# Patient Record
Sex: Female | Born: 1955 | Race: White | Hispanic: No | Marital: Married | Smoking: Never smoker
Health system: Southern US, Community
[De-identification: ages and names within clinical notes are randomized; demographics above are authoritative.]

## PROBLEM LIST (undated history)

## (undated) DIAGNOSIS — F439 Reaction to severe stress, unspecified: Secondary | ICD-10-CM

## (undated) DIAGNOSIS — I10 Essential (primary) hypertension: Secondary | ICD-10-CM

## (undated) DIAGNOSIS — G479 Sleep disorder, unspecified: Secondary | ICD-10-CM

## (undated) DIAGNOSIS — G2581 Restless legs syndrome: Secondary | ICD-10-CM

## (undated) DIAGNOSIS — R011 Cardiac murmur, unspecified: Secondary | ICD-10-CM

## (undated) DIAGNOSIS — R0602 Shortness of breath: Secondary | ICD-10-CM

## (undated) DIAGNOSIS — E039 Hypothyroidism, unspecified: Secondary | ICD-10-CM

## (undated) DIAGNOSIS — R233 Spontaneous ecchymoses: Secondary | ICD-10-CM

## (undated) DIAGNOSIS — Z5189 Encounter for other specified aftercare: Secondary | ICD-10-CM

## (undated) DIAGNOSIS — Z9221 Personal history of antineoplastic chemotherapy: Secondary | ICD-10-CM

## (undated) DIAGNOSIS — R6889 Other general symptoms and signs: Secondary | ICD-10-CM

## (undated) DIAGNOSIS — R350 Frequency of micturition: Secondary | ICD-10-CM

## (undated) DIAGNOSIS — R002 Palpitations: Secondary | ICD-10-CM

## (undated) DIAGNOSIS — M2550 Pain in unspecified joint: Secondary | ICD-10-CM

## (undated) DIAGNOSIS — F419 Anxiety disorder, unspecified: Secondary | ICD-10-CM

## (undated) DIAGNOSIS — D649 Anemia, unspecified: Secondary | ICD-10-CM

## (undated) DIAGNOSIS — M79606 Pain in leg, unspecified: Secondary | ICD-10-CM

## (undated) DIAGNOSIS — M51369 Other intervertebral disc degeneration, lumbar region without mention of lumbar back pain or lower extremity pain: Secondary | ICD-10-CM

## (undated) DIAGNOSIS — R531 Weakness: Secondary | ICD-10-CM

## (undated) DIAGNOSIS — R5383 Other fatigue: Secondary | ICD-10-CM

## (undated) DIAGNOSIS — E559 Vitamin D deficiency, unspecified: Secondary | ICD-10-CM

## (undated) DIAGNOSIS — R519 Headache, unspecified: Secondary | ICD-10-CM

## (undated) DIAGNOSIS — R7303 Prediabetes: Secondary | ICD-10-CM

## (undated) DIAGNOSIS — C50919 Malignant neoplasm of unspecified site of unspecified female breast: Secondary | ICD-10-CM

## (undated) DIAGNOSIS — K259 Gastric ulcer, unspecified as acute or chronic, without hemorrhage or perforation: Secondary | ICD-10-CM

## (undated) DIAGNOSIS — H269 Unspecified cataract: Secondary | ICD-10-CM

## (undated) DIAGNOSIS — R682 Dry mouth, unspecified: Secondary | ICD-10-CM

## (undated) DIAGNOSIS — K219 Gastro-esophageal reflux disease without esophagitis: Secondary | ICD-10-CM

## (undated) DIAGNOSIS — M7989 Other specified soft tissue disorders: Secondary | ICD-10-CM

## (undated) DIAGNOSIS — M1990 Unspecified osteoarthritis, unspecified site: Secondary | ICD-10-CM

## (undated) DIAGNOSIS — M549 Dorsalgia, unspecified: Secondary | ICD-10-CM

## (undated) DIAGNOSIS — R079 Chest pain, unspecified: Secondary | ICD-10-CM

## (undated) DIAGNOSIS — T7840XA Allergy, unspecified, initial encounter: Secondary | ICD-10-CM

## (undated) DIAGNOSIS — M5136 Other intervertebral disc degeneration, lumbar region: Secondary | ICD-10-CM

## (undated) DIAGNOSIS — M5126 Other intervertebral disc displacement, lumbar region: Secondary | ICD-10-CM

## (undated) DIAGNOSIS — R450 Nervousness: Secondary | ICD-10-CM

## (undated) DIAGNOSIS — Z923 Personal history of irradiation: Secondary | ICD-10-CM

## (undated) DIAGNOSIS — F329 Major depressive disorder, single episode, unspecified: Secondary | ICD-10-CM

## (undated) DIAGNOSIS — M6289 Other specified disorders of muscle: Secondary | ICD-10-CM

## (undated) DIAGNOSIS — R51 Headache: Secondary | ICD-10-CM

## (undated) DIAGNOSIS — C801 Malignant (primary) neoplasm, unspecified: Secondary | ICD-10-CM

## (undated) DIAGNOSIS — G4730 Sleep apnea, unspecified: Secondary | ICD-10-CM

## (undated) DIAGNOSIS — R631 Polydipsia: Secondary | ICD-10-CM

## (undated) DIAGNOSIS — E119 Type 2 diabetes mellitus without complications: Secondary | ICD-10-CM

## (undated) DIAGNOSIS — R238 Other skin changes: Secondary | ICD-10-CM

## (undated) DIAGNOSIS — F32A Depression, unspecified: Secondary | ICD-10-CM

## (undated) HISTORY — PX: EYE SURGERY: SHX253

## (undated) HISTORY — DX: Headache, unspecified: R51.9

## (undated) HISTORY — PX: ABDOMINAL HYSTERECTOMY: SHX81

## (undated) HISTORY — DX: Prediabetes: R73.03

## (undated) HISTORY — DX: Other specified disorders of muscle: M62.89

## (undated) HISTORY — DX: Spontaneous ecchymoses: R23.3

## (undated) HISTORY — DX: Other specified soft tissue disorders: M79.89

## (undated) HISTORY — DX: Headache: R51

## (undated) HISTORY — DX: Chest pain, unspecified: R07.9

## (undated) HISTORY — DX: Nervousness: R45.0

## (undated) HISTORY — DX: Malignant (primary) neoplasm, unspecified: C80.1

## (undated) HISTORY — DX: Other general symptoms and signs: R68.89

## (undated) HISTORY — PX: TUBAL LIGATION: SHX77

## (undated) HISTORY — DX: Dorsalgia, unspecified: M54.9

## (undated) HISTORY — DX: Gastro-esophageal reflux disease without esophagitis: K21.9

## (undated) HISTORY — DX: Vitamin D deficiency, unspecified: E55.9

## (undated) HISTORY — PX: BREAST SURGERY: SHX581

## (undated) HISTORY — DX: Other skin changes: R23.8

## (undated) HISTORY — DX: Weakness: R53.1

## (undated) HISTORY — DX: Dry mouth, unspecified: R68.2

## (undated) HISTORY — DX: Shortness of breath: R06.02

## (undated) HISTORY — DX: Reaction to severe stress, unspecified: F43.9

## (undated) HISTORY — DX: Malignant neoplasm of unspecified site of unspecified female breast: C50.919

## (undated) HISTORY — DX: Other fatigue: R53.83

## (undated) HISTORY — PX: FRACTURE SURGERY: SHX138

## (undated) HISTORY — DX: Other intervertebral disc displacement, lumbar region: M51.26

## (undated) HISTORY — DX: Unspecified cataract: H26.9

## (undated) HISTORY — DX: Restless legs syndrome: G25.81

## (undated) HISTORY — DX: Allergy, unspecified, initial encounter: T78.40XA

## (undated) HISTORY — DX: Unspecified osteoarthritis, unspecified site: M19.90

## (undated) HISTORY — DX: Gastric ulcer, unspecified as acute or chronic, without hemorrhage or perforation: K25.9

## (undated) HISTORY — PX: TONSILLECTOMY: SUR1361

## (undated) HISTORY — PX: CATARACT EXTRACTION: SUR2

## (undated) HISTORY — DX: Other intervertebral disc degeneration, lumbar region: M51.36

## (undated) HISTORY — DX: Cardiac murmur, unspecified: R01.1

## (undated) HISTORY — DX: Encounter for other specified aftercare: Z51.89

## (undated) HISTORY — DX: Sleep disorder, unspecified: G47.9

## (undated) HISTORY — DX: Anxiety disorder, unspecified: F41.9

## (undated) HISTORY — DX: Palpitations: R00.2

## (undated) HISTORY — DX: Polydipsia: R63.1

## (undated) HISTORY — DX: Pain in unspecified joint: M25.50

## (undated) HISTORY — DX: Depression, unspecified: F32.A

## (undated) HISTORY — DX: Frequency of micturition: R35.0

## (undated) HISTORY — DX: Major depressive disorder, single episode, unspecified: F32.9

## (undated) HISTORY — DX: Other intervertebral disc degeneration, lumbar region without mention of lumbar back pain or lower extremity pain: M51.369

## (undated) HISTORY — DX: Pain in leg, unspecified: M79.606

## (undated) HISTORY — PX: APPENDECTOMY: SHX54

---

## 1997-07-10 ENCOUNTER — Ambulatory Visit (HOSPITAL_COMMUNITY): Admission: RE | Admit: 1997-07-10 | Discharge: 1997-07-10 | Payer: Self-pay | Admitting: *Deleted

## 1997-12-10 ENCOUNTER — Other Ambulatory Visit: Admission: RE | Admit: 1997-12-10 | Discharge: 1997-12-10 | Payer: Self-pay | Admitting: *Deleted

## 1997-12-16 ENCOUNTER — Ambulatory Visit (HOSPITAL_COMMUNITY): Admission: RE | Admit: 1997-12-16 | Discharge: 1997-12-16 | Payer: Self-pay | Admitting: *Deleted

## 1998-03-21 DIAGNOSIS — Z9889 Other specified postprocedural states: Secondary | ICD-10-CM | POA: Insufficient documentation

## 1998-11-25 ENCOUNTER — Other Ambulatory Visit: Admission: RE | Admit: 1998-11-25 | Discharge: 1998-11-25 | Payer: Self-pay | Admitting: *Deleted

## 1998-12-21 ENCOUNTER — Ambulatory Visit (HOSPITAL_COMMUNITY): Admission: RE | Admit: 1998-12-21 | Discharge: 1998-12-21 | Payer: Self-pay | Admitting: *Deleted

## 1998-12-21 ENCOUNTER — Encounter: Payer: Self-pay | Admitting: *Deleted

## 1999-12-28 ENCOUNTER — Ambulatory Visit (HOSPITAL_COMMUNITY): Admission: RE | Admit: 1999-12-28 | Discharge: 1999-12-28 | Payer: Self-pay | Admitting: *Deleted

## 1999-12-28 ENCOUNTER — Encounter: Payer: Self-pay | Admitting: *Deleted

## 2000-01-19 ENCOUNTER — Other Ambulatory Visit: Admission: RE | Admit: 2000-01-19 | Discharge: 2000-01-19 | Payer: Self-pay | Admitting: *Deleted

## 2001-01-09 ENCOUNTER — Ambulatory Visit (HOSPITAL_COMMUNITY): Admission: RE | Admit: 2001-01-09 | Discharge: 2001-01-09 | Payer: Self-pay | Admitting: *Deleted

## 2001-01-09 ENCOUNTER — Encounter: Payer: Self-pay | Admitting: *Deleted

## 2001-01-19 ENCOUNTER — Other Ambulatory Visit: Admission: RE | Admit: 2001-01-19 | Discharge: 2001-01-19 | Payer: Self-pay | Admitting: *Deleted

## 2002-01-22 ENCOUNTER — Encounter: Payer: Self-pay | Admitting: *Deleted

## 2002-01-22 ENCOUNTER — Ambulatory Visit (HOSPITAL_COMMUNITY): Admission: RE | Admit: 2002-01-22 | Discharge: 2002-01-22 | Payer: Self-pay | Admitting: *Deleted

## 2002-02-04 ENCOUNTER — Other Ambulatory Visit: Admission: RE | Admit: 2002-02-04 | Discharge: 2002-02-04 | Payer: Self-pay | Admitting: *Deleted

## 2002-11-14 ENCOUNTER — Encounter: Payer: Self-pay | Admitting: *Deleted

## 2002-11-14 ENCOUNTER — Encounter: Admission: RE | Admit: 2002-11-14 | Discharge: 2002-11-14 | Payer: Self-pay | Admitting: *Deleted

## 2003-02-06 ENCOUNTER — Other Ambulatory Visit: Admission: RE | Admit: 2003-02-06 | Discharge: 2003-02-06 | Payer: Self-pay | Admitting: *Deleted

## 2004-02-06 ENCOUNTER — Encounter: Admission: RE | Admit: 2004-02-06 | Discharge: 2004-02-06 | Payer: Self-pay | Admitting: *Deleted

## 2004-02-09 ENCOUNTER — Other Ambulatory Visit: Admission: RE | Admit: 2004-02-09 | Discharge: 2004-02-09 | Payer: Self-pay | Admitting: *Deleted

## 2005-02-25 ENCOUNTER — Other Ambulatory Visit: Admission: RE | Admit: 2005-02-25 | Discharge: 2005-02-25 | Payer: Self-pay | Admitting: Obstetrics and Gynecology

## 2005-05-03 ENCOUNTER — Ambulatory Visit (HOSPITAL_COMMUNITY): Admission: RE | Admit: 2005-05-03 | Discharge: 2005-05-03 | Payer: Self-pay | Admitting: Family Medicine

## 2005-06-13 ENCOUNTER — Encounter (HOSPITAL_COMMUNITY): Admission: RE | Admit: 2005-06-13 | Discharge: 2005-07-13 | Payer: Self-pay | Admitting: General Surgery

## 2006-05-27 ENCOUNTER — Ambulatory Visit: Admission: RE | Admit: 2006-05-27 | Discharge: 2006-05-27 | Payer: Self-pay | Admitting: Family Medicine

## 2006-05-27 ENCOUNTER — Encounter (INDEPENDENT_AMBULATORY_CARE_PROVIDER_SITE_OTHER): Payer: Self-pay | Admitting: Family Medicine

## 2006-08-05 ENCOUNTER — Emergency Department (HOSPITAL_COMMUNITY): Admission: EM | Admit: 2006-08-05 | Discharge: 2006-08-05 | Payer: Self-pay | Admitting: Emergency Medicine

## 2008-03-11 ENCOUNTER — Encounter: Admission: RE | Admit: 2008-03-11 | Discharge: 2008-03-11 | Payer: Self-pay | Admitting: Family Medicine

## 2008-08-26 ENCOUNTER — Encounter (HOSPITAL_COMMUNITY): Admission: RE | Admit: 2008-08-26 | Discharge: 2008-09-25 | Payer: Self-pay | Admitting: Orthopedic Surgery

## 2009-03-21 DIAGNOSIS — Z5189 Encounter for other specified aftercare: Secondary | ICD-10-CM

## 2009-03-21 DIAGNOSIS — IMO0001 Reserved for inherently not codable concepts without codable children: Secondary | ICD-10-CM

## 2009-03-21 HISTORY — PX: REPLACEMENT TOTAL KNEE: SUR1224

## 2009-03-21 HISTORY — DX: Reserved for inherently not codable concepts without codable children: IMO0001

## 2009-03-21 HISTORY — PX: JOINT REPLACEMENT: SHX530

## 2009-03-21 HISTORY — DX: Encounter for other specified aftercare: Z51.89

## 2009-11-30 ENCOUNTER — Inpatient Hospital Stay (HOSPITAL_COMMUNITY): Admission: RE | Admit: 2009-11-30 | Discharge: 2009-12-04 | Payer: Self-pay | Admitting: Orthopedic Surgery

## 2010-06-03 LAB — CBC
HCT: 37.8 % (ref 36.0–46.0)
Hemoglobin: 8.5 g/dL — ABNORMAL LOW (ref 12.0–15.0)
Hemoglobin: 12.8 g/dL (ref 12.0–15.0)
MCH: 28.4 pg (ref 26.0–34.0)
MCH: 27.6 pg (ref 26.0–34.0)
MCH: 27.9 pg (ref 26.0–34.0)
MCHC: 33.3 g/dL (ref 30.0–36.0)
MCHC: 33.2 g/dL (ref 30.0–36.0)
MCV: 83.1 fL (ref 78.0–100.0)
Platelets: 157 10*3/uL (ref 150–400)
Platelets: 155 10*3/uL (ref 150–400)
Platelets: 181 10*3/uL (ref 150–400)
Platelets: 182 10*3/uL (ref 150–400)
RBC: 3.06 MIL/uL — ABNORMAL LOW (ref 3.87–5.11)
RBC: 3.66 MIL/uL — ABNORMAL LOW (ref 3.87–5.11)
RBC: 3.22 MIL/uL — ABNORMAL LOW (ref 3.87–5.11)
RBC: 3.71 MIL/uL — ABNORMAL LOW (ref 3.87–5.11)
RBC: 4.57 MIL/uL (ref 3.87–5.11)
RDW: 14.8 % (ref 11.5–15.5)
WBC: 7.4 10*3/uL (ref 4.0–10.5)
WBC: 8 10*3/uL (ref 4.0–10.5)
WBC: 10.4 10*3/uL (ref 4.0–10.5)

## 2010-06-03 LAB — BASIC METABOLIC PANEL
BUN: 7 mg/dL (ref 6–23)
BUN: 9 mg/dL (ref 6–23)
CO2: 32 mEq/L (ref 19–32)
CO2: 29 mEq/L (ref 19–32)
Calcium: 8.5 mg/dL (ref 8.4–10.5)
Calcium: 8.5 mg/dL (ref 8.4–10.5)
Creatinine, Ser: 0.84 mg/dL (ref 0.4–1.2)
GFR calc non Af Amer: >60 mL/min (ref >60)
GFR calc non Af Amer: >60 mL/min (ref >60)
Glucose, Bld: 151 mg/dL — ABNORMAL HIGH (ref 70–99)
Sodium: 139 mEq/L (ref 135–145)

## 2010-06-03 LAB — URINALYSIS, ROUTINE W REFLEX MICROSCOPIC
Bilirubin Urine: NEGATIVE
Glucose, UA: NEGATIVE mg/dL
Hgb urine dipstick: NEGATIVE
Nitrite: NEGATIVE
Protein, ur: NEGATIVE mg/dL
Urobilinogen, UA: 0.2 mg/dL (ref 0.0–1.0)
pH: 6 (ref 5.0–8.0)

## 2010-06-03 LAB — CROSSMATCH: Antibody Screen: NEGATIVE

## 2010-06-03 LAB — COMPREHENSIVE METABOLIC PANEL
AST: 18 U/L (ref 0–37)
Albumin: 4 g/dL (ref 3.5–5.2)
BUN: 10 mg/dL (ref 6–23)
Calcium: 9.3 mg/dL (ref 8.4–10.5)
Potassium: 4.4 mEq/L (ref 3.5–5.1)
Total Protein: 6.9 g/dL (ref 6.0–8.3)

## 2010-06-03 LAB — PROTIME-INR
INR: 2.13 — ABNORMAL HIGH (ref 0.00–1.49)
INR: 1.03 (ref 0.00–1.49)
INR: 1.13 (ref 0.00–1.49)
INR: 2.04 — ABNORMAL HIGH (ref 0.00–1.49)
Prothrombin Time: 24 seconds — ABNORMAL HIGH (ref 11.6–15.2)
Prothrombin Time: 25 seconds — ABNORMAL HIGH (ref 11.6–15.2)
Prothrombin Time: 13.7 seconds (ref 11.6–15.2)

## 2010-06-03 LAB — SURGICAL PCR SCREEN: MRSA, PCR: NEGATIVE

## 2010-06-03 LAB — ABO/RH: ABO/RH(D): O NEG

## 2010-06-03 LAB — APTT: aPTT: 41 seconds — ABNORMAL HIGH (ref 24–37)

## 2010-11-29 ENCOUNTER — Other Ambulatory Visit: Payer: Self-pay | Admitting: Obstetrics and Gynecology

## 2010-12-24 ENCOUNTER — Encounter (HOSPITAL_COMMUNITY)
Admission: RE | Admit: 2010-12-24 | Discharge: 2010-12-24 | Disposition: A | Discharge status: Home / Self Care | Payer: Federal, State, Local not specified - PPO | Source: Ambulatory Visit | Attending: Obstetrics and Gynecology | Admitting: Obstetrics and Gynecology

## 2010-12-24 ENCOUNTER — Encounter (HOSPITAL_COMMUNITY): Payer: Self-pay

## 2010-12-24 HISTORY — DX: Encounter for other specified aftercare: Z51.89

## 2010-12-24 HISTORY — DX: Anemia, unspecified: D64.9

## 2010-12-24 HISTORY — DX: Hypothyroidism, unspecified: E03.9

## 2010-12-24 LAB — CBC
MCV: 88.7 fL (ref 78.0–100.0)
Platelets: 161 10*3/uL (ref 150–400)
RBC: 4.85 MIL/uL (ref 3.87–5.11)
RDW: 13.1 % (ref 11.5–15.5)
WBC: 6.4 10*3/uL (ref 4.0–10.5)

## 2010-12-24 LAB — SURGICAL PCR SCREEN: MRSA, PCR: NEGATIVE

## 2010-12-24 NOTE — Patient Instructions (Signed)
   Your procedure is scheduled on:12/29/10  Enter through the Main Entrance of [facility] at:0700 Pick up the phone at the desk and dial [phone] and inform us of your arrival  Please call this number if you have any problems the morning of surgery: [phone]  Remember: Do not eat food after midnight Tuesday Do not drink clear liquids after:Tuesday Take these medicines the morning of surgery with a SIP OF WATER: thyroid med  Do not wear jewelry, make-up, or FINGER nail polish Do not wear lotions, powders, or perfumes.  You may wear deodorant. Do not shave 48 hours prior to surgery. Do not bring valuables to the hospital. Leave suitcase in the car. After Surgery it may be brought to your room. For patients being admitted to the hospital, checkout time is 11:00am the day of discharge.  Patients discharged on the day of surgery will not be allowed to drive home.   Name and phone number of your driver: Windy Fast- [phone]   Remember to use your hibiclens as instructed.Please shower with 1/2 bottle the evening before your surgery and the other 1/2 bottle the morning of surgery.

## 2010-12-28 MED ORDER — DEXTROSE 5 % IV SOLN
1.0000 g | INTRAVENOUS | Status: AC
  Administered 2010-12-29: 1 g via INTRAVENOUS
  Filled 2010-12-28: qty 1

## 2010-12-28 NOTE — H&P (Signed)
55 y.o. yo complains of SUI and cystocele.  Pt has evidence of mixed incontinence but does have significant SUI.  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  She does not feel like she empties her bladder because of her cystocele.   Pt has some fecal incontinence but no symptoms of rectocele or splinting.  She does have uterine prolapse as well.  Pt also had an episode of PMP bleeding; EMB done was negative.  Past Medical History  Diagnosis Date  . Hypothyroidism   . Anemia   . Blood transfusion 2011    at [facility]   Past Surgical History  Procedure Date  . Joint replacement 2011    rt knee  . Tubal ligation   . Appendectomy   . Tonsillectomy     History   Social History  . Marital Status: Married    Spouse Name: N/A    Number of Children: N/A  . Years of Education: N/A   Occupational History  . Not on file.   Social History Main Topics  . Smoking status: Never Smoker   . Smokeless tobacco: Not on file  . Alcohol Use: No  . Drug Use: No  . Sexually Active:    Other Topics Concern  . Not on file   Social History Narrative  . No narrative on file    No current facility-administered medications on file prior to encounter.   No current outpatient prescriptions on file prior to encounter.    No Known Allergies  @VITALS2 @  Lungs: clear to ascultation Cor:  RRR Abdomen:  soft, nontender, nondistended. Ex:  no cords, erythema Pelvic:  NEFG, nml s/s uterus.  Cystocele to -2, rectocele -2,  decensus of uterus.    U/S  7x5x5, em 5.1.  Two small fibroids, 1-2 cm.  Normal ovaries.   EMB  Polyp, benign otherwise.  A:  For TVH, TVT, A-repair, cysto for PMP bleed, SUI, cystocele and uterine prolapse.   P: All risks, benefits and alternatives d/w patient and she desires to proceed with procedures. .  Patient will receive preop antibiotics and SCDs during the operation.     Karen Vazquez A

## 2010-12-29 ENCOUNTER — Encounter (HOSPITAL_COMMUNITY)
Admission: RE | Disposition: A | Discharge status: Home / Self Care | Payer: Self-pay | Source: Ambulatory Visit | Attending: Obstetrics and Gynecology

## 2010-12-29 ENCOUNTER — Encounter (HOSPITAL_COMMUNITY): Payer: Self-pay | Admitting: Anesthesiology

## 2010-12-29 ENCOUNTER — Ambulatory Visit (HOSPITAL_COMMUNITY)
Admission: RE | Admit: 2010-12-29 | Discharge: 2010-12-30 | Disposition: A | Discharge status: Home / Self Care | Payer: Federal, State, Local not specified - PPO | Source: Ambulatory Visit | Attending: Obstetrics and Gynecology | Admitting: Obstetrics and Gynecology

## 2010-12-29 ENCOUNTER — Other Ambulatory Visit: Payer: Self-pay | Admitting: Obstetrics and Gynecology

## 2010-12-29 ENCOUNTER — Encounter (HOSPITAL_COMMUNITY): Payer: Self-pay | Admitting: *Deleted

## 2010-12-29 ENCOUNTER — Ambulatory Visit (HOSPITAL_COMMUNITY): Payer: Federal, State, Local not specified - PPO | Admitting: Anesthesiology

## 2010-12-29 DIAGNOSIS — N393 Stress incontinence (female) (male): Secondary | ICD-10-CM | POA: Insufficient documentation

## 2010-12-29 DIAGNOSIS — N812 Incomplete uterovaginal prolapse: Secondary | ICD-10-CM | POA: Insufficient documentation

## 2010-12-29 DIAGNOSIS — D251 Intramural leiomyoma of uterus: Secondary | ICD-10-CM | POA: Insufficient documentation

## 2010-12-29 DIAGNOSIS — Z9889 Other specified postprocedural states: Secondary | ICD-10-CM

## 2010-12-29 HISTORY — PX: CYSTOCELE REPAIR: SHX163

## 2010-12-29 HISTORY — PX: VAGINAL HYSTERECTOMY: SHX2639

## 2010-12-29 HISTORY — PX: BLADDER SUSPENSION: SHX72

## 2010-12-29 HISTORY — PX: CYSTOSCOPY: SHX5120

## 2010-12-29 SURGERY — HYSTERECTOMY, VAGINAL
Anesthesia: General | Site: Vagina | Wound class: Clean Contaminated

## 2010-12-29 MED ORDER — KETOROLAC TROMETHAMINE 30 MG/ML IJ SOLN: 30.0000 mg | Freq: Four times a day (QID) | INTRAMUSCULAR | Status: DC

## 2010-12-29 MED ORDER — ONDANSETRON HCL 4 MG/2ML IJ SOLN: 4.0000 mg | Freq: Four times a day (QID) | INTRAMUSCULAR | Status: DC | PRN

## 2010-12-29 MED ORDER — THYROID 30 MG PO TABS
15.0000 mg | ORAL_TABLET | Freq: Every day | ORAL | Status: DC
  Administered 2010-12-30: 15 mg via ORAL
  Filled 2010-12-29 (×2): qty 1

## 2010-12-29 MED ORDER — PROPOFOL 10 MG/ML IV EMUL
INTRAVENOUS | Status: DC | PRN
  Administered 2010-12-29: 30 mg via INTRAVENOUS
  Administered 2010-12-29: 100 mg via INTRAVENOUS
  Administered 2010-12-29: 200 mg via INTRAVENOUS
  Administered 2010-12-29: 30 mg via INTRAVENOUS

## 2010-12-29 MED ORDER — ONDANSETRON HCL 4 MG/2ML IJ SOLN
INTRAMUSCULAR | Status: AC
  Filled 2010-12-29: qty 2

## 2010-12-29 MED ORDER — LIDOCAINE-EPINEPHRINE (PF) 1 %-1:200000 IJ SOLN
INTRAMUSCULAR | Status: DC | PRN
  Administered 2010-12-29: 20 mL

## 2010-12-29 MED ORDER — FENTANYL CITRATE 0.05 MG/ML IJ SOLN
INTRAMUSCULAR | Status: AC
  Filled 2010-12-29: qty 2

## 2010-12-29 MED ORDER — ONDANSETRON HCL 4 MG PO TABS: 4.0000 mg | ORAL_TABLET | Freq: Four times a day (QID) | ORAL | Status: DC | PRN

## 2010-12-29 MED ORDER — OXYCODONE-ACETAMINOPHEN 5-325 MG PO TABS
1.0000 | ORAL_TABLET | ORAL | Status: DC | PRN
  Administered 2010-12-29 – 2010-12-30 (×2): 1 via ORAL
  Filled 2010-12-29 (×2): qty 1

## 2010-12-29 MED ORDER — LIDOCAINE HCL (CARDIAC) 20 MG/ML IV SOLN
INTRAVENOUS | Status: DC | PRN
  Administered 2010-12-29: 60 mg via INTRAVENOUS

## 2010-12-29 MED ORDER — ONDANSETRON HCL 4 MG/2ML IJ SOLN
INTRAMUSCULAR | Status: DC | PRN
  Administered 2010-12-29: 4 mg via INTRAVENOUS

## 2010-12-29 MED ORDER — KETOROLAC TROMETHAMINE 30 MG/ML IJ SOLN
INTRAMUSCULAR | Status: AC
  Filled 2010-12-29: qty 1

## 2010-12-29 MED ORDER — INDIGOTINDISULFONATE SODIUM 8 MG/ML IJ SOLN
INTRAMUSCULAR | Status: DC | PRN
  Administered 2010-12-29 (×2): 5 mL via INTRAVENOUS

## 2010-12-29 MED ORDER — LACTATED RINGERS IV SOLN
INTRAVENOUS | Status: DC
  Administered 2010-12-29 (×3): via INTRAVENOUS

## 2010-12-29 MED ORDER — DEXAMETHASONE SODIUM PHOSPHATE 10 MG/ML IJ SOLN
INTRAMUSCULAR | Status: DC | PRN
  Administered 2010-12-29: 10 mg via INTRAVENOUS

## 2010-12-29 MED ORDER — FENTANYL CITRATE 0.05 MG/ML IJ SOLN
INTRAMUSCULAR | Status: AC
  Filled 2010-12-29: qty 5

## 2010-12-29 MED ORDER — PROPOFOL 10 MG/ML IV EMUL
INTRAVENOUS | Status: AC
  Filled 2010-12-29: qty 20

## 2010-12-29 MED ORDER — SUCCINYLCHOLINE CHLORIDE 20 MG/ML IJ SOLN
INTRAMUSCULAR | Status: DC | PRN
  Administered 2010-12-29: 100 mg via INTRAVENOUS

## 2010-12-29 MED ORDER — STERILE WATER FOR IRRIGATION IR SOLN
Status: DC | PRN
  Administered 2010-12-29: 1000 mL

## 2010-12-29 MED ORDER — LIDOCAINE HCL (CARDIAC) 20 MG/ML IV SOLN
INTRAVENOUS | Status: AC
  Filled 2010-12-29: qty 5

## 2010-12-29 MED ORDER — MORPHINE SULFATE 10 MG/ML IJ SOLN: 0.0500 mg/kg | INTRAMUSCULAR | Status: DC | PRN

## 2010-12-29 MED ORDER — ZOLPIDEM TARTRATE 5 MG PO TABS: 5.0000 mg | ORAL_TABLET | Freq: Every evening | ORAL | Status: DC | PRN

## 2010-12-29 MED ORDER — HYDROMORPHONE HCL 1 MG/ML IJ SOLN
INTRAMUSCULAR | Status: DC | PRN
  Administered 2010-12-29: 1 mg via INTRAVENOUS

## 2010-12-29 MED ORDER — ESTRADIOL 0.1 MG/GM VA CREA
TOPICAL_CREAM | VAGINAL | Status: DC | PRN
  Administered 2010-12-29: 1 via VAGINAL

## 2010-12-29 MED ORDER — ROCURONIUM BROMIDE 50 MG/5ML IV SOLN
INTRAVENOUS | Status: AC
  Filled 2010-12-29: qty 1

## 2010-12-29 MED ORDER — ROPINIROLE HCL 1 MG PO TABS
1.0000 mg | ORAL_TABLET | Freq: Every day | ORAL | Status: DC
  Administered 2010-12-29: 1 mg via ORAL
  Filled 2010-12-29 (×2): qty 1

## 2010-12-29 MED ORDER — NEOSTIGMINE METHYLSULFATE 1 MG/ML IJ SOLN
INTRAMUSCULAR | Status: AC
  Filled 2010-12-29: qty 10

## 2010-12-29 MED ORDER — HYDROMORPHONE HCL 1 MG/ML IJ SOLN
INTRAMUSCULAR | Status: AC
  Filled 2010-12-29: qty 1

## 2010-12-29 MED ORDER — MIDAZOLAM HCL 5 MG/5ML IJ SOLN
INTRAMUSCULAR | Status: DC | PRN
  Administered 2010-12-29: 2 mg via INTRAVENOUS

## 2010-12-29 MED ORDER — FENTANYL CITRATE 0.05 MG/ML IJ SOLN
INTRAMUSCULAR | Status: DC | PRN
  Administered 2010-12-29: 100 ug via INTRAVENOUS
  Administered 2010-12-29: 50 ug via INTRAVENOUS
  Administered 2010-12-29 (×2): 100 ug via INTRAVENOUS

## 2010-12-29 MED ORDER — DEXAMETHASONE SODIUM PHOSPHATE 10 MG/ML IJ SOLN
INTRAMUSCULAR | Status: AC
  Filled 2010-12-29: qty 1

## 2010-12-29 MED ORDER — MENTHOL 3 MG MT LOZG: 1.0000 | LOZENGE | OROMUCOSAL | Status: DC | PRN

## 2010-12-29 MED ORDER — MIDAZOLAM HCL 2 MG/2ML IJ SOLN
INTRAMUSCULAR | Status: AC
  Filled 2010-12-29: qty 2

## 2010-12-29 MED ORDER — THYROID 30 MG PO TABS
30.0000 mg | ORAL_TABLET | Freq: Every day | ORAL | Status: DC
  Administered 2010-12-30: 15 mg via ORAL
  Filled 2010-12-29 (×2): qty 1

## 2010-12-29 MED ORDER — IBUPROFEN 800 MG PO TABS: 800.0000 mg | ORAL_TABLET | Freq: Three times a day (TID) | ORAL | Status: DC | PRN

## 2010-12-29 MED ORDER — KETOROLAC TROMETHAMINE 30 MG/ML IJ SOLN
INTRAMUSCULAR | Status: DC | PRN
  Administered 2010-12-29: 30 mg via INTRAVENOUS

## 2010-12-29 MED ORDER — GLYCOPYRROLATE 0.2 MG/ML IJ SOLN
INTRAMUSCULAR | Status: AC
  Filled 2010-12-29: qty 2

## 2010-12-29 SURGICAL SUPPLY — 44 items
ADH SKN CLS APL DERMABOND .7 (GAUZE/BANDAGES/DRESSINGS) ×4
BLADE SURG 15 STRL LF C SS BP (BLADE) ×4 IMPLANT
BLADE SURG 15 STRL SS (BLADE) ×5
CANISTER SUCTION 2500CC (MISCELLANEOUS) ×5 IMPLANT
CATH ROBINSON RED A/P 16FR (CATHETERS) ×5 IMPLANT
CLOTH BEACON ORANGE TIMEOUT ST (SAFETY) ×5 IMPLANT
CONT PATH 16OZ SNAP LID 3702 (MISCELLANEOUS) ×2 IMPLANT
DECANTER SPIKE VIAL GLASS SM (MISCELLANEOUS) ×2 IMPLANT
DERMABOND ADVANCED (GAUZE/BANDAGES/DRESSINGS) ×1
DERMABOND ADVANCED .7 DNX12 (GAUZE/BANDAGES/DRESSINGS) ×4 IMPLANT
DRAPE HYSTEROSCOPY (DRAPE) ×5 IMPLANT
FORMULA 20CAL 3 OZ MEAD (FORMULA) ×2 IMPLANT
GAUZE PACKING 1 X5 YD ST (GAUZE/BANDAGES/DRESSINGS) ×2 IMPLANT
GAUZE PACKING 2X5 YD STERILE (GAUZE/BANDAGES/DRESSINGS) ×5 IMPLANT
GLOVE BIO SURGEON STRL SZ7 (GLOVE) ×10 IMPLANT
GLOVE BIOGEL PI IND STRL 6.5 (GLOVE) ×4 IMPLANT
GLOVE BIOGEL PI INDICATOR 6.5 (GLOVE) ×1
GOWN PREVENTION PLUS LG XLONG (DISPOSABLE) ×16 IMPLANT
NEEDLE HYPO 22GX1.5 SAFETY (NEEDLE) ×5 IMPLANT
NS IRRIG 1000ML POUR BTL (IV SOLUTION) ×5 IMPLANT
PACK VAGINAL WOMENS (CUSTOM PROCEDURE TRAY) ×5 IMPLANT
PLUG CATH AND CAP STER (CATHETERS) ×5 IMPLANT
SET CYSTO W/LG BORE CLAMP LF (SET/KITS/TRAYS/PACK) ×5 IMPLANT
SLING TRANS VAGINAL TAPE (Sling) ×2 IMPLANT
SLING UTERINE/ABD GYNECARE TVT (Sling) ×5 IMPLANT
SPONGE GAUZE 4X4 12PLY (GAUZE/BANDAGES/DRESSINGS) ×2 IMPLANT
SPONGE SURGIFOAM ABS GEL 12-7 (HEMOSTASIS) ×2 IMPLANT
SUT SILK 3 0 SH CR/8 (SUTURE) IMPLANT
SUT VIC AB 0 CT1 18XCR BRD8 (SUTURE) ×12 IMPLANT
SUT VIC AB 0 CT1 27 (SUTURE) ×10
SUT VIC AB 0 CT1 27XBRD ANBCTR (SUTURE) ×8 IMPLANT
SUT VIC AB 0 CT1 8-18 (SUTURE) ×15
SUT VIC AB 2-0 CT1 (SUTURE) ×8 IMPLANT
SUT VIC AB 2-0 CT1 27 (SUTURE) ×20
SUT VIC AB 2-0 CT1 TAPERPNT 27 (SUTURE) ×16 IMPLANT
SUT VIC AB 2-0 SH 27 (SUTURE) ×10
SUT VIC AB 2-0 SH 27XBRD (SUTURE) ×11 IMPLANT
SUT VIC AB 2-0 UR6 27 (SUTURE) ×2 IMPLANT
SUT VICRYL 0 TIES 12 18 (SUTURE) ×5 IMPLANT
SYR 50ML LL SCALE MARK (SYRINGE) IMPLANT
SYRINGE TOOMEY DISP (SYRINGE) ×2 IMPLANT
TOWEL OR 17X24 6PK STRL BLUE (TOWEL DISPOSABLE) ×10 IMPLANT
TRAY FOLEY CATH 14FR (SET/KITS/TRAYS/PACK) ×5 IMPLANT
WATER STERILE IRR 1000ML POUR (IV SOLUTION) ×5 IMPLANT

## 2010-12-29 NOTE — Brief Op Note (Signed)
12/29/2010  10:42 AM  PATIENT:  Karen Vazquez  55 y.o. female  PRE-OPERATIVE DIAGNOSIS:  Stress Urinary Incontinence; Uterine Prolapse; Cystocele  POST-OPERATIVE DIAGNOSIS:  Stress Urinary Incontinence; Uterine Prolapse; Cystocele  PROCEDURE:  Procedure(s): HYSTERECTOMY VAGINAL TRANSVAGINAL TAPE (TVT) PROCEDURE ANTERIOR REPAIR (CYSTOCELE) CYSTOSCOPY  SURGEON:  Surgeon(s): Skyelar Swigart A Dayona Shaheen W Scott [name]  PHYSICIAN ASSISTANT:   ASSISTANTS: [name]   ANESTHESIA:   general  EBL:  Total I/O In: 1000 [I.V.:1000] Out: 150 [Blood:150]   LOCAL MEDICATIONS USED:  LIDOCAINE with epi  SPECIMEN:  Source of Specimen:  uterus and cervix  DISPOSITION OF SPECIMEN:  PATHOLOGY  COUNTS:  YES  TOURNIQUET:  * No tourniquets in log *  DICTATION: .see below  PLAN OF CARE: Admit for overnight observation  PATIENT DISPOSITION:  PACU - hemodynamically stable.   Delay start of Pharmacological VTE agent (>24hrs) due to surgical blood loss or risk of bleeding:  not applicable  Complications:  First placement of TVT was not in bladder but was too superior- it wasn't mid-urethral and it was removed and replaced with a second TVT that was mid-urethral.  Meds:  Similac in bladder, lidocaine with epi, indigo carmine. Gel foam and estrace.  Findings: cystocele to -1, 7 week size uterus and normal ovaries.  Technique:  After general anesthesia was achieved, the patient was prepped and draped in a sterile fashion.  The bladder was emptied with a red rubber catheter and 60 cc of Similac was placed in the bladder. The cervix was grasped with a pair of Lahey clamps and injected circumferentially with 1% lidocaine with epi. A circumferential incision was made around the cervix with the scalpel at the level of the reflection of the vagina onto the cervix and the posterior cul-de-sac was entered into with Mayo scissors. The long billed duckbill retractor was then placed and the bladder was removed off  the cervix carefully with sharp dissection with the Metzenbaums. The the uterosacrals were grasped with a pair heney clamps on either side and secured with a Heaney stitch of 0 Vicryl. Cardinal ligament was then divided with alternating successive bites of the Heaney clamp followed by incision with the Mayo scissors and secured with stitches of 0 Vicryl at the level of the cornua Heaneys were placed bilaterally around the entire pedicle and the uterus was able to be amputated. The pedicles were secured with a free hand stitch of 0 Vicryl followed by a stitch of 0 Vicryl bilaterally. An additional stitch on the patient's left-hand side was placed to ensure hemostasis. Once hemostasis was achieved the peritoneum was closed in a pursestring fashion including the bilateral uterosacrals and posteriorly in and out of the vagina and a partial Halbans culdoplasty.  The stitch was pulled shot closing the peritoneum and pulling the uterosacrals together through the modified Hall bands and through the vagina. The Foley was placed at this point.  The anterior vaginal mucosa was then entered into in the midline with the help of Lada's after being injected with 1% lidocaine with epi with the scalpel. The vaginal mucosa was then reflected off of the vesicouterine fascia with careful sharp dissection with the Metzenbaums until the pelvic floor could feel be felt underneath the pubic bone. 2 small stab incisions are made on 2 cm either side of the midline just above the pubic bone. The abdominal needles were placed carefully through the pelvic floor and out the vagina. The Foley was removed and the cystoscope placed inside the bladder. Although the there   were no needles in the bladder and the ureters were spilling indigo carmine once the TVT had been placed on the abdominal needles attached to the vaginal needles and pulled through it was noticed that the sling was not sitting mid-urethral, but rather much smaller superior  towards the bladder neck. At this point the sling was able to be removed with simple tension through the vagina and an additional TVT was obtained. This time the needles were very carefully placed behind the pubic bone and rocked all the way forward after the deep pubic bone was cleared so that I could place the sling mid-urethral. The cystoscopy was informed again and again there were no needles in the bladder and there was good spillage of indigo carmine from the bilateral ureteral orifices. The abdominal needles were attached the vaginal needles and the tape pulled through and cut into place. A Kelly was used to ensure that the there was no tension placed on the tape. Once I was satisfied that the sling was in the correct place and at no tension, the anterior repair was done with 2 mattress stitches of 0 Vicryl. Hemostasis was achieved with the Bovie cautery and some Gelfoam. The vaginal mucosa was trimmed and closed with a running locked stitch of 2-0 Vicryl. The cuff was then closed with interrupted figure-of-eight stitches of 0 Vicryl. A vaginal pack was placed inside the vagina with Estrace cream and the Foley had been replaced patient tolerated the procedure was returned to recovery in stable condition.  Karen Vazquez A  

## 2010-12-29 NOTE — Transfer of Care (Signed)
Immediate Anesthesia Transfer of Care Note  Patient: Karen Vazquez  Procedure(s) Performed:  HYSTERECTOMY VAGINAL; TRANSVAGINAL TAPE (TVT) PROCEDURE; ANTERIOR REPAIR (CYSTOCELE); CYSTOSCOPY  Patient Location: PACU  Anesthesia Type: General  Level of Consciousness: awake, oriented and sedated  Airway & Oxygen Therapy: Patient Spontanous Breathing and Patient connected to nasal cannula oxygen  Post-op Assessment: Report given to PACU RN and Post -op Vital signs reviewed and stable  Post vital signs: stable  Complications: No apparent anesthesia complications

## 2010-12-29 NOTE — Op Note (Signed)
12/29/2010  10:42 AM  PATIENT:  Karen Vazquez  55 y.o. female  PRE-OPERATIVE DIAGNOSIS:  Stress Urinary Incontinence; Uterine Prolapse; Cystocele  POST-OPERATIVE DIAGNOSIS:  Stress Urinary Incontinence; Uterine Prolapse; Cystocele  PROCEDURE:  Procedure(s): HYSTERECTOMY VAGINAL TRANSVAGINAL TAPE (TVT) PROCEDURE ANTERIOR REPAIR (CYSTOCELE) CYSTOSCOPY  SURGEON:  Surgeon(s): Elray Mcgregor  PHYSICIAN ASSISTANT:   ASSISTANTS: [name]   ANESTHESIA:   general  EBL:  Total I/O In: 1000 [I.V.:1000] Out: 150 [Blood:150]   LOCAL MEDICATIONS USED:  LIDOCAINE with epi  SPECIMEN:  Source of Specimen:  uterus and cervix  DISPOSITION OF SPECIMEN:  PATHOLOGY  COUNTS:  YES  TOURNIQUET:  * No tourniquets in log *  DICTATION: .see below  PLAN OF CARE: Admit for overnight observation  PATIENT DISPOSITION:  PACU - hemodynamically stable.   Delay start of Pharmacological VTE agent (>24hrs) due to surgical blood loss or risk of bleeding:  not applicable  Complications:  First placement of TVT was not in bladder but was too superior- it wasn't mid-urethral and it was removed and replaced with a second TVT that was mid-urethral.  Meds:  Similac in bladder, lidocaine with epi, indigo carmine. Gel foam and estrace.  Findings: cystocele to -1, 7 week size uterus and normal ovaries.  Technique:  After general anesthesia was achieved, the patient was prepped and draped in a sterile fashion.  The bladder was emptied with a red rubber catheter and 60 cc of Similac was placed in the bladder. The cervix was grasped with a pair of Lahey clamps and injected circumferentially with 1% lidocaine with epi. A circumferential incision was made around the cervix with the scalpel at the level of the reflection of the vagina onto the cervix and the posterior cul-de-sac was entered into with Mayo scissors. The long billed duckbill retractor was then placed and the bladder was removed off  the cervix carefully with sharp dissection with the Metzenbaums. The the uterosacrals were grasped with a pair heney clamps on either side and secured with a Heaney stitch of 0 Vicryl. Cardinal ligament was then divided with alternating successive bites of the Heaney clamp followed by incision with the Mayo scissors and secured with stitches of 0 Vicryl at the level of the cornua Heaneys were placed bilaterally around the entire pedicle and the uterus was able to be amputated. The pedicles were secured with a free hand stitch of 0 Vicryl followed by a stitch of 0 Vicryl bilaterally. An additional stitch on the patient's left-hand side was placed to ensure hemostasis. Once hemostasis was achieved the peritoneum was closed in a pursestring fashion including the bilateral uterosacrals and posteriorly in and out of the vagina and a partial Halbans culdoplasty.  The stitch was pulled shot closing the peritoneum and pulling the uterosacrals together through the modified Hall bands and through the vagina. The Foley was placed at this point.  The anterior vaginal mucosa was then entered into in the midline with the help of [name]'s after being injected with 1% lidocaine with epi with the scalpel. The vaginal mucosa was then reflected off of the vesicouterine fascia with careful sharp dissection with the Metzenbaums until the pelvic floor could feel be felt underneath the pubic bone. 2 small stab incisions are made on 2 cm either side of the midline just above the pubic bone. The abdominal needles were placed carefully through the pelvic floor and out the vagina. The Foley was removed and the cystoscope placed inside the bladder. Although the there  were no needles in the bladder and the ureters were spilling indigo carmine once the TVT had been placed on the abdominal needles attached to the vaginal needles and pulled through it was noticed that the sling was not sitting mid-urethral, but rather much smaller superior  towards the bladder neck. At this point the sling was able to be removed with simple tension through the vagina and an additional TVT was obtained. This time the needles were very carefully placed behind the pubic bone and rocked all the way forward after the deep pubic bone was cleared so that I could place the sling mid-urethral. The cystoscopy was informed again and again there were no needles in the bladder and there was good spillage of indigo carmine from the bilateral ureteral orifices. The abdominal needles were attached the vaginal needles and the tape pulled through and cut into place. A [name] was used to ensure that the there was no tension placed on the tape. Once I was satisfied that the sling was in the correct place and at no tension, the anterior repair was done with 2 mattress stitches of 0 Vicryl. Hemostasis was achieved with the Bovie cautery and some Gelfoam. The vaginal mucosa was trimmed and closed with a running locked stitch of 2-0 Vicryl. The cuff was then closed with interrupted figure-of-eight stitches of 0 Vicryl. A vaginal pack was placed inside the vagina with Estrace cream and the Foley had been replaced patient tolerated the procedure was returned to recovery in stable condition.  [name] A

## 2010-12-29 NOTE — Progress Notes (Signed)
There has been no change in the patients history or status since the history and physical.  Filed Vitals:   12/29/10 0706  BP: 138/73  Pulse: 78  Temp: 98.1 F (36.7 C)  TempSrc: Oral  Resp: 18  SpO2: 97%    Lab Results  Component Value Date   WBC 6.4 12/24/2010   HGB 13.8 12/24/2010   HCT 43.0 12/24/2010   MCV 88.7 12/24/2010   PLT 161 12/24/2010    Jaiyden Laur A

## 2010-12-29 NOTE — Anesthesia Postprocedure Evaluation (Signed)
  Anesthesia Post-op Note  Patient: Karen Vazquez  Procedure(s) Performed:  HYSTERECTOMY VAGINAL; TRANSVAGINAL TAPE (TVT) PROCEDURE; ANTERIOR REPAIR (CYSTOCELE); CYSTOSCOPY  Patient Location: [facility]  Anesthesia Type: General  Level of Consciousness: sedated  Airway and Oxygen Therapy: Patient connected to nasal cannula oxygen  Post-op Pain: mild  Post-op Assessment: Patient's Cardiovascular Status Stable and Respiratory Function Stable  Post-op Vital Signs: stable  Complications: No apparent anesthesia complications

## 2010-12-29 NOTE — Progress Notes (Signed)
Orders stated to d/c foley in PACU. Pt stated that Dr. Henderson Cloud told her that she would go home on a catheter and was to call the office Monday to make an appointment to have the catheter removed.

## 2010-12-29 NOTE — Anesthesia Preprocedure Evaluation (Addendum)
Anesthesia Evaluation  Name, MR# and DOB Patient awake  General Assessment Comment  Reviewed: Allergy & Precautions, H&P , NPO status , Patient's Chart, lab work & pertinent test results, reviewed documented beta blocker date and time   Airway Mallampati: I TM Distance: >3 FB Neck ROM: Full    Dental No notable dental hx.    Pulmonary  clear to auscultation  Pulmonary exam normal       Cardiovascular regular Normal    Neuro/Psych Negative Neurological ROS     GI/Hepatic negative GI ROS Neg liver ROS    Endo/Other    Renal/GU negative Renal ROS  Genitourinary negative   Musculoskeletal negative musculoskeletal ROS (+)   Abdominal Normal abdominal exam  (+)   Peds negative pediatric ROS (+)  Hematology negative hematology ROS (+)   Anesthesia Other Findings   Reproductive/Obstetrics negative OB ROS                          Anesthesia Physical Anesthesia Plan  ASA: II  Anesthesia Plan: General   Post-op Pain Management:    Induction: Intravenous  Airway Management Planned: LMA  Additional Equipment:   Intra-op Plan:   Post-operative Plan:   Informed Consent: I have reviewed the patients History and Physical, chart, labs and discussed the procedure including the risks, benefits and alternatives for the proposed anesthesia with the patient or authorized representative who has indicated his/her understanding and acceptance.   Dental Advisory Given  Plan Discussed with: CRNA and Surgeon  Anesthesia Plan Comments:       Anesthesia Quick Evaluation

## 2010-12-30 LAB — CBC
HCT: 34.1 % — ABNORMAL LOW (ref 36.0–46.0)
Hemoglobin: 11.1 g/dL — ABNORMAL LOW (ref 12.0–15.0)
MCV: 88.1 fL (ref 78.0–100.0)
Platelets: 176 10*3/uL (ref 150–400)
RBC: 3.87 MIL/uL (ref 3.87–5.11)
WBC: 10.1 10*3/uL (ref 4.0–10.5)

## 2010-12-30 MED ORDER — OXYCODONE-ACETAMINOPHEN 5-325 MG PO TABS: 1.0000 | ORAL_TABLET | ORAL | Status: AC | PRN

## 2010-12-30 MED ORDER — CEFUROXIME AXETIL 250 MG PO TABS: 250.0000 mg | ORAL_TABLET | Freq: Two times a day (BID) | ORAL | Status: AC

## 2010-12-30 NOTE — Discharge Summary (Signed)
Physician Discharge Summary  Patient ID: Karen Vazquez MRN: [id number] DOB/AGE: 1955-07-06 55 y.o.  Admit date: 12/29/2010 Discharge date: 12/30/2010  Admission Diagnoses:  SUI, cystocele, uterine prolapse, PMP bleeding  Discharge Diagnoses: Same Active Problems:  * No active hospital problems. *    Discharged Condition: good  Hospital Course: Underwent uncomplicated TVH/TVT/A-repair.   D/C on POD#1.  Consults: none  Significant Diagnostic Studies: labs:  Lab Results  Component Value Date   WBC 10.1 12/30/2010   HGB 11.1* 12/30/2010   HCT 34.1* 12/30/2010   MCV 88.1 12/30/2010   PLT 176 12/30/2010    Treatments: surgery: TVT/TVH/Arepair/cysto  Discharge Exam: Blood pressure 120/69, pulse 77, temperature 98.5 F (36.9 C), temperature source Oral, resp. rate 18, height 5\' 5"  (1.651 m), weight 99.791 kg (220 lb), SpO2 94.00%. see last PN  Disposition:   Discharge Orders    Future Orders Please Complete By Expires   Diet - low sodium heart healthy      Discharge instructions      Comments:   No driving on narcotics, no sexual activity for 2 weeks.   Increase activity slowly      May shower / Bathe      Comments:   Shower, no bath for 2 weeks.   Sexual Activity Restrictions      Comments:   No sexual activity for 2 weeks.   Remove dressing in 24 hours      Call MD for:  temperature >100.4        Current Discharge Medication List    START taking these medications   Details  cefUROXime (CEFTIN) 250 MG tablet Take 1 tablet (250 mg total) by mouth 2 (two) times daily. Qty: 14 tablet, Refills: 2    oxyCODONE-acetaminophen (PERCOCET) 5-325 MG per tablet Take 1-2 tablets by mouth every 4 (four) hours as needed (moderate to severe pain (when tolerating fluids)). Qty: 30 tablet, Refills: 0      CONTINUE these medications which have NOT CHANGED   Details  rOPINIRole (REQUIP) 1 MG tablet Take 1 mg by mouth at bedtime.      !! thyroid (ARMOUR) 15 MG tablet Take  15 mg by mouth daily.      !! thyroid (ARMOUR) 30 MG tablet Take 30 mg by mouth daily.       !! - Potential duplicate medications found. Please discuss with provider.     Follow-up Information    Follow up with Catherene Kaleta A. Make an appointment in 3 days. (voiding trial at office- call and come in at 830 am)    Contact information:   [address] [place] [address] [phone]          Signed: Loney Laurence 12/30/2010, 8:38 AM

## 2010-12-30 NOTE — Progress Notes (Signed)
Patient is eating, ambulating, not voiding- foley in.  Pain control is good.  BP 120/69  Pulse 77  Temp(Src) 98.5 F (36.9 C) (Oral)  Resp 18  Ht 5\' 5"  (1.651 m)  Wt 99.791 kg (220 lb)  BMI 36.61 kg/m2  SpO2 94%  lungs:   clear to auscultation cor:    RRR Abdomen:  soft, appropriate tenderness, incisions intact and without erythema or exudate. ex:    no cords   Lab Results  Component Value Date   WBC 10.1 12/30/2010   HGB 11.1* 12/30/2010   HCT 34.1* 12/30/2010   MCV 88.1 12/30/2010   PLT 176 12/30/2010    A/P  Routine care.  Expect d/c per plan. Home with catheter.  Ceftin for UTI prophylaxis, Percocet for pain control.  Vera Wishart A

## 2010-12-30 NOTE — Progress Notes (Signed)
D/c instructions reviewed with pt and husband, both state understanding of same, sent home with leg bag and regular drainage bag with instruction on use, wheelchair to car with staff without incident, d/c'd with spouse

## 2011-01-02 ENCOUNTER — Encounter (HOSPITAL_COMMUNITY): Payer: Self-pay | Admitting: Obstetrics and Gynecology

## 2011-06-08 ENCOUNTER — Other Ambulatory Visit: Payer: Self-pay | Admitting: Obstetrics and Gynecology

## 2012-12-12 ENCOUNTER — Encounter: Payer: Self-pay | Admitting: Family Medicine

## 2012-12-12 ENCOUNTER — Ambulatory Visit (INDEPENDENT_AMBULATORY_CARE_PROVIDER_SITE_OTHER): Payer: Federal, State, Local not specified - PPO | Admitting: Family Medicine

## 2012-12-12 VITALS — BP 138/86 | Temp 98.1°F | Ht 64.0 in | Wt 234.0 lb

## 2012-12-12 DIAGNOSIS — E785 Hyperlipidemia, unspecified: Secondary | ICD-10-CM

## 2012-12-12 DIAGNOSIS — G4700 Insomnia, unspecified: Secondary | ICD-10-CM

## 2012-12-12 DIAGNOSIS — G2581 Restless legs syndrome: Secondary | ICD-10-CM

## 2012-12-12 DIAGNOSIS — R7309 Other abnormal glucose: Secondary | ICD-10-CM

## 2012-12-12 DIAGNOSIS — R739 Hyperglycemia, unspecified: Secondary | ICD-10-CM

## 2012-12-12 DIAGNOSIS — E039 Hypothyroidism, unspecified: Secondary | ICD-10-CM

## 2012-12-12 DIAGNOSIS — Z23 Encounter for immunization: Secondary | ICD-10-CM

## 2012-12-12 DIAGNOSIS — M1990 Unspecified osteoarthritis, unspecified site: Secondary | ICD-10-CM

## 2012-12-12 DIAGNOSIS — Z79899 Other long term (current) drug therapy: Secondary | ICD-10-CM

## 2012-12-12 MED ORDER — ZOLPIDEM TARTRATE 5 MG PO TABS: 5.0000 mg | ORAL_TABLET | Freq: Every evening | ORAL | Status: DC | PRN

## 2012-12-12 MED ORDER — ROPINIROLE HCL 2 MG PO TABS: ORAL_TABLET | ORAL | Status: DC

## 2012-12-12 NOTE — Progress Notes (Signed)
  Subjective:    Patient ID: Karen Vazquez, female    DOB: 06-13-1955, 57 y.o.   MRN: [id number]  HPIHere for restless leg. Has been on meds for restless leg for about 10 years. Seems to be getting worse. Takes requip 1mg  about 1 hour before bedtime.  This patient relates Requip just doesn't do as much as it use to. She also relates difficulty with restlessness of the legs throughout the day as well. Has difficult time at nighttime. She does use Ambien at nighttime to help her sleep but does not use it every single night she worries sometimes about that could be too strong for her other cause other problems. She does relate mild URI symptoms. Symptoms only for a couple days Cough, runny nose and congestion. Sore throat yesterday.   Had bloodwork for thyroid in July. Pt states it was normal. Patient was seen by endocrinologist at that time and they told her that her current dose seems to be good She also has a history of prediabetes. She is overweight she knows she needs to watch her diet she knows she needs exercise and try to bring weight down. She denies headaches chest tightness pressure pain shortness breath she has family history hypertension and diabetes.  Review of Systems Denies rectal bleeding up-to-date on mammogram and colonoscopy. Denies joint pain.    Objective:   Physical Exam Eardrums normal throat is normal now no sinus tenderness Lungs are clear no crackles Heart regular no murmurs pulse normal blood pressure recheck is good Abdomen soft obese extremities no edema       Assessment & Plan:  Obesity-counseled regarding dietary measures exercise try to bring weight down #2 hypothyroidism continue current dose check TSH again next spring #3 hyperlipidemia continue current medication check lab work await results #4 restless legs increase Requip 2 mg each evening #5 insomnia recommend Ambien 5 mg tablet each evening. Patient does use Ultram occasionally for joint pain also  Mobic occasionally for joint pain has history of osteoarthritis Followup 6 months

## 2012-12-13 ENCOUNTER — Telehealth: Payer: Self-pay | Admitting: Family Medicine

## 2012-12-13 ENCOUNTER — Other Ambulatory Visit: Payer: Self-pay | Admitting: *Deleted

## 2012-12-13 MED ORDER — CEFPROZIL 500 MG PO TABS: 500.0000 mg | ORAL_TABLET | Freq: Two times a day (BID) | ORAL | Status: DC

## 2012-12-13 NOTE — Telephone Encounter (Signed)
Pt states that you said for her to call back if her symptoms are more of having a sinus infection now, can you go ahead and call something in? [facility]

## 2012-12-13 NOTE — Telephone Encounter (Signed)
Med sent to pharm. Pt notified.  

## 2012-12-13 NOTE — Telephone Encounter (Signed)
cefzil 500, one bid 10 days

## 2012-12-14 LAB — HEPATIC FUNCTION PANEL
AST: 17 U/L (ref 0–37)
Albumin: 3.9 g/dL (ref 3.5–5.2)
Bilirubin, Direct: 0.1 mg/dL (ref 0.0–0.3)
Total Bilirubin: 0.4 mg/dL (ref 0.3–1.2)

## 2012-12-14 LAB — LIPID PANEL
Cholesterol: 184 mg/dL (ref 0–200)
Total CHOL/HDL Ratio: 5 Ratio
Triglycerides: 128 mg/dL (ref <150)

## 2012-12-14 LAB — HEMOGLOBIN A1C: Hgb A1c MFr Bld: 5.8 % — ABNORMAL HIGH (ref <5.7)

## 2012-12-14 LAB — BASIC METABOLIC PANEL
BUN: 12 mg/dL (ref 6–23)
Creat: 0.89 mg/dL (ref 0.50–1.10)
Glucose, Bld: 99 mg/dL (ref 70–99)
Potassium: 4.9 mEq/L (ref 3.5–5.3)

## 2012-12-18 MED ORDER — SIMVASTATIN 10 MG PO TABS: 20.0000 mg | ORAL_TABLET | Freq: Every day | ORAL | Status: DC

## 2012-12-18 NOTE — Addendum Note (Signed)
Addended by: Margaretha Sheffield on: 12/18/2012 04:36 PM   Modules accepted: Orders

## 2013-01-24 ENCOUNTER — Encounter (INDEPENDENT_AMBULATORY_CARE_PROVIDER_SITE_OTHER): Payer: Self-pay | Admitting: *Deleted

## 2013-02-06 ENCOUNTER — Encounter (INDEPENDENT_AMBULATORY_CARE_PROVIDER_SITE_OTHER): Payer: Self-pay | Admitting: Internal Medicine

## 2013-02-06 ENCOUNTER — Ambulatory Visit (INDEPENDENT_AMBULATORY_CARE_PROVIDER_SITE_OTHER): Payer: Federal, State, Local not specified - PPO | Admitting: Internal Medicine

## 2013-02-06 VITALS — BP 124/80 | HR 70 | Temp 98.8°F | Ht 64.0 in | Wt 232.9 lb

## 2013-02-06 DIAGNOSIS — K219 Gastro-esophageal reflux disease without esophagitis: Secondary | ICD-10-CM

## 2013-02-06 MED ORDER — PANTOPRAZOLE SODIUM 40 MG PO TBEC: 40.0000 mg | DELAYED_RELEASE_TABLET | Freq: Every day | ORAL | Status: DC

## 2013-02-06 NOTE — Patient Instructions (Signed)
Protonix 40mg  daily. OV in 1 month .  Weight loss. GERD diet reviewed with patient.

## 2013-02-06 NOTE — Progress Notes (Signed)
Subjective:     Patient ID: Karen Vazquez, female   DOB: 1955-12-23, 57 y.o.   MRN: [id number]  HPI Referred to our office by Dr Lilyan Punt for GERD. She tells me her stomach feel unsettled. Symptoms off and on for about a month. She has frequent acid reflux especially at night. She does have acid reflux after eating.  She tried her daughter's Protonix x 1 week and this helped. Fatty foods, chocoloate really bother her.  If she eats after 7 or 8, symptoms worsen.  If she vomits, she  feel better. Her appetite is good. No weight loss.  She usually has a BM 1-2 a day.  Stools are normal. No melena or bright red rectal bleeding. Last colonoscopy age 89 in 2010 for average risk screening . Four mm polyp at transvese colon. Biopsy Tubular adenoma. She is unable to exercise for weight loss due to arthritis in her knee. Rt knee replacement in 2011.    Retired from [employer]. Married with 2 children.    Review of Systems see hpi Current Outpatient Prescriptions  Medication Sig Dispense Refill  . ALPRAZolam (XANAX) 0.5 MG tablet Take 0.5 mg by mouth 3 (three) times daily as needed for sleep.      . meloxicam (MOBIC) 15 MG tablet Take 15 mg by mouth as needed.       . naproxen sodium (ANAPROX) 220 MG tablet Take 220 mg by mouth as needed.      Marland Kitchen rOPINIRole (REQUIP) 2 MG tablet 2mg  each evening  90 tablet  3  . thyroid (ARMOUR) 15 MG tablet Take 15 mg by mouth daily.       Marland Kitchen thyroid (ARMOUR) 30 MG tablet Take 30 mg by mouth daily.        . traMADol (ULTRAM) 50 MG tablet Take 50 mg by mouth at bedtime as needed for pain.      Marland Kitchen zolpidem (AMBIEN) 5 MG tablet Take 1 tablet (5 mg total) by mouth at bedtime as needed for sleep.  30 tablet  5  . estradiol (ESTRACE) 0.5 MG tablet Take 0.5 mg by mouth daily.      . pantoprazole (PROTONIX) 40 MG tablet Take 1 tablet (40 mg total) by mouth daily.  30 tablet  3  . Phentermine-Topiramate (QSYMIA) 3.75-23 MG CP24 Take by mouth.       No current  facility-administered medications for this visit.   Past Medical History  Diagnosis Date  . Hypothyroidism   . Blood transfusion 2011    at [facility]  . Arthritis   . Restless leg syndrome    Past Surgical History  Procedure Laterality Date  . Joint replacement  2011    rt knee  . Tubal ligation    . Appendectomy    . Tonsillectomy    . Vaginal hysterectomy  12/29/2010    Procedure: HYSTERECTOMY VAGINAL;  Surgeon: Loney Laurence;  Location: [facility];  Service: Gynecology;  Laterality: N/A;  . Bladder suspension  12/29/2010    Procedure: TRANSVAGINAL TAPE (TVT) PROCEDURE;  Surgeon: Loney Laurence;  Location: [facility];  Service: Gynecology;  Laterality: N/A;  . Cystocele repair  12/29/2010    Procedure: ANTERIOR REPAIR (CYSTOCELE);  Surgeon: Loney Laurence;  Location: [facility];  Service: Gynecology;  Laterality: N/A;  . Cystoscopy  12/29/2010    Procedure: CYSTOSCOPY;  Surgeon: Loney Laurence;  Location: [facility];  Service: Gynecology;  Laterality: N/A;  . Replacement total knee Right 2011  No Known Allergies     Objective:   Physical Exam  Filed Vitals:   02/06/13 0959  BP: 124/80  Pulse: 70  Temp: 98.8 F (37.1 C)  Height: 5\' 4"  (1.626 m)  Weight: 232 lb 14.4 oz (105.643 kg)   Alert and oriented. Skin warm and dry. Oral mucosa is moist.   . Sclera anicteric, conjunctivae is pink. Thyroid not enlarged. No cervical lymphadenopathy. Lungs clear. Heart regular rate and rhythm.  Abdomen is soft. Bowel sounds are positive. No hepatomegaly. No abdominal masses felt. No tenderness. Obese.  No edema to lower extremities.       Assessment:    Probable GERD. Symptoms better once she started Protonix. EGD was offered today. She would like to wait and see how the Protonix does.     Plan:    Protonix 40mg  po daily 30 minutes before breakfast. GERD diet reviewed with patient.  She will have OV in 1 month. If symptoms do not improve, will need an EGD. Protonix savings card gave  to patient.  Patient is agreeable with plan of care.

## 2013-03-20 ENCOUNTER — Ambulatory Visit (INDEPENDENT_AMBULATORY_CARE_PROVIDER_SITE_OTHER): Payer: Federal, State, Local not specified - PPO | Admitting: Internal Medicine

## 2013-05-01 ENCOUNTER — Ambulatory Visit (INDEPENDENT_AMBULATORY_CARE_PROVIDER_SITE_OTHER): Payer: Federal, State, Local not specified - PPO | Admitting: Family Medicine

## 2013-05-01 ENCOUNTER — Encounter: Payer: Self-pay | Admitting: Family Medicine

## 2013-05-01 VITALS — BP 134/92 | Ht 64.0 in | Wt 228.0 lb

## 2013-05-01 DIAGNOSIS — R7303 Prediabetes: Secondary | ICD-10-CM | POA: Insufficient documentation

## 2013-05-01 DIAGNOSIS — R7309 Other abnormal glucose: Secondary | ICD-10-CM

## 2013-05-01 DIAGNOSIS — R739 Hyperglycemia, unspecified: Secondary | ICD-10-CM

## 2013-05-01 DIAGNOSIS — G2581 Restless legs syndrome: Secondary | ICD-10-CM

## 2013-05-01 DIAGNOSIS — M79609 Pain in unspecified limb: Secondary | ICD-10-CM

## 2013-05-01 DIAGNOSIS — M79606 Pain in leg, unspecified: Secondary | ICD-10-CM

## 2013-05-01 LAB — POCT GLYCOSYLATED HEMOGLOBIN (HGB A1C): Hemoglobin A1C: 5.4

## 2013-05-01 MED ORDER — ROPINIROLE HCL 3 MG PO TABS: ORAL_TABLET | ORAL | Status: DC

## 2013-05-01 NOTE — Progress Notes (Signed)
   Subjective:    Patient ID: Karen Vazquez, female    DOB: [birth year], 58 y.o.   MRN: [id number]  HPI Patient is here today for leg pain.  She is taking Requip at nighttime. She states around 4 PM it starts aching in her thighs and in her calves up feels like a burning and aching sensation. She denies any numbness tingling she denies any weakness. She does state she gets intermittent numb area on the left lower foot. This comes and goes. She is followed by endocrinology for hypothyroidism and she does have prediabetes she does not know what her last hemoglobin A1c is. She relates compliance with her medicines. She is trying to exercise and lose weight she's had a previous knee replacement on the right side and told she has osteoarthritis on the left. She has lost about 6 pounds over the past several months. She is up-to-date on colonoscopy up-to-date on mammogram She has been seen here in the past, but still does not have any relief.     Review of Systems See above    Objective:   Physical Exam Lungs are clear hearts regular pulse normal mild crepitus left knee subjective discomfort into the legs no edema. Skin warm dry       Assessment & Plan:  Restless legs-we'll check a sedimentation rate in order to rule out connective tissue disease but I feel that this patient is dealing with restless legs. We will increase her medicine to 3 mg. If this does not adequately improve things we may need to go to 4 mg  Prediabetes her A1c looks great she was encouraged to continue lose weight watch diet closely.  She'll followup with specialist regarding hypothyroidism

## 2013-05-02 ENCOUNTER — Encounter: Payer: Self-pay | Admitting: Family Medicine

## 2013-05-02 LAB — ANA: Anti Nuclear Antibody(ANA): NEGATIVE

## 2013-05-02 LAB — SEDIMENTATION RATE: Sed Rate: 4 mm/hr (ref 0–22)

## 2013-05-03 NOTE — Progress Notes (Signed)
Patient notified and verbalized understanding. 

## 2013-05-24 ENCOUNTER — Telehealth: Payer: Self-pay | Admitting: Family Medicine

## 2013-05-24 NOTE — Telephone Encounter (Signed)
Rx prior auth obtained for pt's AMBIEN 5mg , expires 05/03/14 through [facility]

## 2013-06-20 ENCOUNTER — Other Ambulatory Visit: Payer: Self-pay | Admitting: *Deleted

## 2013-06-20 MED ORDER — THYROID 30 MG PO TABS: 30.0000 mg | ORAL_TABLET | Freq: Every day | ORAL | Status: DC

## 2013-06-20 MED ORDER — THYROID 15 MG PO TABS: 15.0000 mg | ORAL_TABLET | Freq: Every day | ORAL | Status: DC

## 2013-07-17 ENCOUNTER — Telehealth: Payer: Self-pay | Admitting: *Deleted

## 2013-07-17 MED ORDER — ZOLPIDEM TARTRATE 5 MG PO TABS: 5.0000 mg | ORAL_TABLET | Freq: Every evening | ORAL | Status: DC | PRN

## 2013-07-17 NOTE — Telephone Encounter (Signed)
Ok times 6 

## 2013-07-17 NOTE — Addendum Note (Signed)
Addended by: Dairl Ponder on: 07/17/2013 09:51 AM   Modules accepted: Orders

## 2013-07-17 NOTE — Telephone Encounter (Signed)
zolpidem (AMBIEN) 5 MG tablet   #30 tablet   Route: Take 1 tablet (5 mg total) by mouth at bedtime as needed for sleep  Last seen: 2/11

## 2013-07-17 NOTE — Telephone Encounter (Signed)
Rx printed and faxed to pharmacy. 

## 2013-08-28 ENCOUNTER — Telehealth: Payer: Self-pay | Admitting: Family Medicine

## 2013-08-28 ENCOUNTER — Encounter: Payer: Self-pay | Admitting: Nurse Practitioner

## 2013-08-28 ENCOUNTER — Ambulatory Visit (INDEPENDENT_AMBULATORY_CARE_PROVIDER_SITE_OTHER): Payer: Federal, State, Local not specified - PPO | Admitting: Nurse Practitioner

## 2013-08-28 VITALS — BP 144/90 | Temp 98.0°F | Ht 64.0 in | Wt 229.0 lb

## 2013-08-28 DIAGNOSIS — J329 Chronic sinusitis, unspecified: Secondary | ICD-10-CM

## 2013-08-28 DIAGNOSIS — J310 Chronic rhinitis: Secondary | ICD-10-CM

## 2013-08-28 MED ORDER — AZITHROMYCIN 250 MG PO TABS: ORAL_TABLET | ORAL | Status: DC

## 2013-08-28 NOTE — Telephone Encounter (Addendum)
Patient wants to hold on Hycodan at this time and will call back if she needs rx

## 2013-08-28 NOTE — Patient Instructions (Signed)
Nasacort AQ as directed 

## 2013-08-28 NOTE — Telephone Encounter (Signed)
Prescribed cough med has hydrocodone; that has to be a written Rx; I can print but she will have to pick it up (DEA rules)

## 2013-08-28 NOTE — Telephone Encounter (Signed)
Patient wants to know if we wanted to call her in something for cough?   [facility]

## 2013-08-29 ENCOUNTER — Encounter: Payer: Self-pay | Admitting: Nurse Practitioner

## 2013-08-29 NOTE — Progress Notes (Signed)
Subjective:  Presents for c/o severe sinus pressure and sore throat that began 5 d ago. No fever. Cough x 2 d. PND. Slight wheeze. No ear pain. Facial/occipital area headache.   Objective:   BP 144/90  Temp(Src) 98 F (36.7 C)  Ht 5\' 4"  (1.626 m)  Wt 229 lb (103.874 kg)  BMI 39.29 kg/m2  LMP 05/24/2010 NAD. Alert, oriented. TMs clear effusion, no erythema. Pharynx injected with green PND noted. Lungs clear. Heart RRR.   Assessment: Rhinosinusitis  Plan:  Meds ordered this encounter  Medications  . azithromycin (ZITHROMAX Z-PAK) 250 MG tablet    Sig: Take 2 tablets (500 mg) on  Day 1,  followed by 1 tablet (250 mg) once daily on Days 2 through 5.    Dispense:  6 each    Refill:  0    Order Specific Question:  Supervising Provider    Answer:  Mikey Kirschner [[id number]]   OTC antihistamine and Nasacort AQ as directed. Call back if worsens or persists.

## 2013-09-26 ENCOUNTER — Other Ambulatory Visit: Payer: Self-pay | Admitting: Nurse Practitioner

## 2013-11-20 ENCOUNTER — Ambulatory Visit (INDEPENDENT_AMBULATORY_CARE_PROVIDER_SITE_OTHER): Payer: Federal, State, Local not specified - PPO | Admitting: Family Medicine

## 2013-11-20 ENCOUNTER — Encounter: Payer: Self-pay | Admitting: Family Medicine

## 2013-11-20 VITALS — BP 138/82 | Ht 64.0 in | Wt 233.0 lb

## 2013-11-20 DIAGNOSIS — E785 Hyperlipidemia, unspecified: Secondary | ICD-10-CM

## 2013-11-20 DIAGNOSIS — E039 Hypothyroidism, unspecified: Secondary | ICD-10-CM

## 2013-11-20 DIAGNOSIS — R7309 Other abnormal glucose: Secondary | ICD-10-CM

## 2013-11-20 DIAGNOSIS — M25569 Pain in unspecified knee: Secondary | ICD-10-CM

## 2013-11-20 DIAGNOSIS — G2581 Restless legs syndrome: Secondary | ICD-10-CM

## 2013-11-20 DIAGNOSIS — R739 Hyperglycemia, unspecified: Secondary | ICD-10-CM

## 2013-11-20 MED ORDER — AMOXICILLIN-POT CLAVULANATE 875-125 MG PO TABS: 1.0000 | ORAL_TABLET | Freq: Two times a day (BID) | ORAL | Status: DC

## 2013-11-20 NOTE — Progress Notes (Signed)
   Subjective:    Patient ID: Karen Vazquez, female    DOB: [birth year], 58 y.o.   MRN: [id number]  HPI Patient is here today with what she states as a nail fugus on Left foot great toe. She states that her toe at times is very sore and tender she doesn't really think it has any drainage. She does try to wear proper fitting shoes.  Reviewing medications patient isn't sure why she was prescribed the naproxen sodium & would like to discuss further.we talked at length about some of her joint discomforts she has in the knees she also has an aching sensation in the thighs and in her legs at times she has restless legs at night but she was not able to tolerate a slightly higher dose of the medication of Requip. She does have insomnia issues. She is currently taking Ambien. I told her that this was reasonable at 5 mg she may add melatonin to it.  Her reflux is under good control at that time.  She is under the care of endocrinology for her thyroid. She is trying to watch her diet and lose some weight. She also uses naproxen occasionally for joint discomforts. She is up-to-date on colonoscopy.  These are the concerns of the patient today.   Review of Systems She relates her pain  She relates bilateral leg discomfort worse when she tries walking distance but is also present even when she lays down at night at times her legs feel restless but other times they just ache she denies any numbness in her feet she denies numbness or aching in her arms or hands. She denies chest pressure tightness pain shortness of breath nausea vomiting diarrhea or rectal bleeding    Objective:   Physical Exam On physical exam her lungs were clear heart pulse normal extremities she does have trace edema but not severe she also has what appears to be a separating toe nail that appears to be thickening in is not ingrown and may have some mild infection with but I suspect a fungal component  Diabetic foot exam was  completed.       Assessment & Plan:  History of pre-diabetes check hemoglobin A1c History hyperlipidemia and the importance of getting LDL below 100 was discussed Significant arthralgias in the legs and discomfort along with restless legs hard to discern if there is other problems going on this does not appear to be orthopedic related. Referral to rheumatology.  Restless legs check ferritin level.  Thyroid hypothyroid check TSH T4. Probable fungal nail recommend that the patient clippings of this and send it in for a fungal culture possible Lamisil await the results 25 minutes patient if not more

## 2013-11-21 LAB — T4, FREE: FREE T4: 1.02 ng/dL (ref 0.80–1.80)

## 2013-11-21 LAB — LIPID PANEL
CHOL/HDL RATIO: 4.2 ratio
Cholesterol: 176 mg/dL (ref 0–200)
HDL: 42 mg/dL (ref >39)
LDL CALC: 116 mg/dL — AB (ref 0–99)
Triglycerides: 91 mg/dL (ref <150)
VLDL: 18 mg/dL (ref 0–40)

## 2013-11-21 LAB — FERRITIN: FERRITIN: 7 ng/mL — AB (ref 10–291)

## 2013-11-21 LAB — TSH: TSH: 1.437 u[IU]/mL (ref 0.350–4.500)

## 2013-11-21 LAB — HEMOGLOBIN A1C
Hgb A1c MFr Bld: 5.4 % (ref <5.7)
Mean Plasma Glucose: 108 mg/dL (ref <117)

## 2013-11-26 ENCOUNTER — Telehealth: Payer: Self-pay | Admitting: Family Medicine

## 2013-11-26 DIAGNOSIS — B351 Tinea unguium: Secondary | ICD-10-CM

## 2013-11-26 NOTE — Telephone Encounter (Signed)
pts spouse dropped off her nail clippings, but did say he has had Them with him for around a week. If this is not useable please call the Pt to have another sample brought in

## 2013-11-26 NOTE — Telephone Encounter (Signed)
Sent for culture.

## 2013-11-27 ENCOUNTER — Other Ambulatory Visit (INDEPENDENT_AMBULATORY_CARE_PROVIDER_SITE_OTHER): Payer: Federal, State, Local not specified - PPO

## 2013-11-27 DIAGNOSIS — R7989 Other specified abnormal findings of blood chemistry: Secondary | ICD-10-CM

## 2013-11-27 DIAGNOSIS — R790 Abnormal level of blood mineral: Secondary | ICD-10-CM

## 2013-11-29 LAB — CBC WITH DIFFERENTIAL/PLATELET
Basophils Absolute: 0 10*3/uL (ref 0.0–0.1)
Basophils Relative: 0 % (ref 0–1)
EOS ABS: 0.1 10*3/uL (ref 0.0–0.7)
Eosinophils Relative: 2 % (ref 0–5)
HCT: 34.7 % — ABNORMAL LOW (ref 36.0–46.0)
Hemoglobin: 11.4 g/dL — ABNORMAL LOW (ref 12.0–15.0)
Lymphocytes Relative: 32 % (ref 12–46)
Lymphs Abs: 1.6 10*3/uL (ref 0.7–4.0)
MCH: 24.7 pg — ABNORMAL LOW (ref 26.0–34.0)
MCHC: 32.9 g/dL (ref 30.0–36.0)
MCV: 75.3 fL — ABNORMAL LOW (ref 78.0–100.0)
Monocytes Absolute: 0.5 10*3/uL (ref 0.1–1.0)
Monocytes Relative: 9 % (ref 3–12)
Neutro Abs: 2.9 10*3/uL (ref 1.7–7.7)
Neutrophils Relative %: 57 % (ref 43–77)
PLATELETS: 194 10*3/uL (ref 150–400)
RBC: 4.61 MIL/uL (ref 3.87–5.11)
RDW: 14.9 % (ref 11.5–15.5)
WBC: 5.1 10*3/uL (ref 4.0–10.5)

## 2013-11-30 ENCOUNTER — Other Ambulatory Visit: Payer: Self-pay | Admitting: Family Medicine

## 2013-11-30 DIAGNOSIS — D508 Other iron deficiency anemias: Secondary | ICD-10-CM

## 2013-12-04 LAB — POC HEMOCCULT BLD/STL (HOME/3-CARD/SCREEN)
Card #2 Fecal Occult Blod, POC: NEGATIVE
Card #3 Fecal Occult Blood, POC: NEGATIVE
Fecal Occult Blood, POC: NEGATIVE

## 2013-12-16 ENCOUNTER — Ambulatory Visit (INDEPENDENT_AMBULATORY_CARE_PROVIDER_SITE_OTHER): Payer: Federal, State, Local not specified - PPO | Admitting: Internal Medicine

## 2013-12-16 ENCOUNTER — Encounter (INDEPENDENT_AMBULATORY_CARE_PROVIDER_SITE_OTHER): Payer: Self-pay | Admitting: Internal Medicine

## 2013-12-16 VITALS — BP 126/90 | HR 80 | Temp 97.7°F | Ht 64.5 in | Wt 231.4 lb

## 2013-12-16 DIAGNOSIS — D509 Iron deficiency anemia, unspecified: Secondary | ICD-10-CM

## 2013-12-16 NOTE — Patient Instructions (Addendum)
Stool cards x 3 . CBC in 8 weeks. Iron 325mg  daily.

## 2013-12-16 NOTE — Progress Notes (Signed)
Subjective:    Patient ID: Karen Vazquez, female    DOB: 27-Sep-1955, 58 y.o.   MRN: [id number]  HPI Here today for iron deficiency anemia.  She tells me her stools card this month were negative. She has not seen any blood. Appetite is good. No weight loss. No abdominal pain. No melena or BRRB She usually has a BM once a day and sometimes twice a day. Last colonoscopy in 2010 for average risk.  Gave blood in July and hemoglobin was 13.5. Usually gives blood every 3 months.   CBC    Component Value Date/Time   WBC 5.1 11/27/2013 1131   RBC 4.61 11/27/2013 1131   HGB 11.4* 11/27/2013 1131   HCT 34.7* 11/27/2013 1131   PLT 194 11/27/2013 1131   MCV 75.3* 11/27/2013 1131   MCH 24.7* 11/27/2013 1131   MCHC 32.9 11/27/2013 1131   RDW 14.9 11/27/2013 1131   LYMPHSABS 1.6 11/27/2013 1131   MONOABS 0.5 11/27/2013 1131   EOSABS 0.1 11/27/2013 1131   BASOSABS 0.0 11/27/2013 1131   Ferritin 7 11/27/2013  Last colonoscopy age 89 in 2010 for average risk screening . Four mm polyp at transvese colon. Biopsy Tubular adenoma.  Review of Systems Past Medical History  Diagnosis Date  . Hypothyroidism   . Blood transfusion 2011    at [facility]  . Arthritis   . Restless leg syndrome     Past Surgical History  Procedure Laterality Date  . Joint replacement  2011    rt knee  . Tubal ligation    . Appendectomy    . Tonsillectomy    . Vaginal hysterectomy  12/29/2010    Procedure: HYSTERECTOMY VAGINAL;  Surgeon: Daria Pastures;  Location: [facility];  Service: Gynecology;  Laterality: N/A;  . Bladder suspension  12/29/2010    Procedure: TRANSVAGINAL TAPE (TVT) PROCEDURE;  Surgeon: Daria Pastures;  Location: [facility];  Service: Gynecology;  Laterality: N/A;  . Cystocele repair  12/29/2010    Procedure: ANTERIOR REPAIR (CYSTOCELE);  Surgeon: Daria Pastures;  Location: [facility];  Service: Gynecology;  Laterality: N/A;  . Cystoscopy  12/29/2010    Procedure: CYSTOSCOPY;  Surgeon: Daria Pastures;  Location: [facility];  Service: Gynecology;  Laterality: N/A;  . Replacement total knee Right 2011    No Known Allergies  Current Outpatient Prescriptions on File Prior to Visit  Medication Sig Dispense Refill  . ALPRAZolam (XANAX) 0.5 MG tablet Take 0.5 mg by mouth 3 (three) times daily as needed for sleep.      . naproxen sodium (ANAPROX) 220 MG tablet Take 220 mg by mouth as needed.      . thyroid (ARMOUR) 15 MG tablet Take 1 tablet (15 mg total) by mouth daily.  30 tablet  5  . thyroid (ARMOUR) 30 MG tablet Take 1 tablet (30 mg total) by mouth daily.  30 tablet  5  . traMADol (ULTRAM) 50 MG tablet Take 50 mg by mouth at bedtime as needed for pain.      Marland Kitchen zolpidem (AMBIEN) 5 MG tablet Take 1 tablet (5 mg total) by mouth at bedtime as needed for sleep.  30 tablet  5   No current facility-administered medications on file prior to visit.        Objective:   Physical Exam  Filed Vitals:   12/16/13 1046  BP: 126/90  Pulse: 80  Temp: 97.7 F (36.5 C)  Height: 5' 4.5" (1.638 m)  Weight: 231  lb 6.4 oz (104.962 kg)   Alert and oriented. Skin warm and dry. Oral mucosa is moist.   . Sclera anicteric, conjunctivae is pink. Thyroid not enlarged. No cervical lymphadenopathy. Lungs clear. Heart regular rate and rhythm.  Abdomen is soft. Bowel sounds are positive. No hepatomegaly. No abdominal masses felt. No tenderness.  No edema to lower extremities.     .        Assessment & Plan:  Iron deficiency anemia. Dr. Laural Golden in with patient . Stool cards x 3. CBC in 8 weeks. Start iron 325mg  daily. If any stool card is positive: EGD/Colonoscopy.

## 2013-12-18 ENCOUNTER — Telehealth (INDEPENDENT_AMBULATORY_CARE_PROVIDER_SITE_OTHER): Payer: Self-pay | Admitting: *Deleted

## 2013-12-18 DIAGNOSIS — D509 Iron deficiency anemia, unspecified: Secondary | ICD-10-CM

## 2013-12-18 NOTE — Telephone Encounter (Signed)
.  Per Lelon Perla patient to have labs drawn in 8 weeks.

## 2013-12-23 ENCOUNTER — Telehealth (INDEPENDENT_AMBULATORY_CARE_PROVIDER_SITE_OTHER): Payer: Self-pay | Admitting: *Deleted

## 2013-12-23 LAB — CULTURE, FUNGUS WITHOUT SMEAR

## 2013-12-23 NOTE — Telephone Encounter (Signed)
   Diagnosis:    Result(s)   Card 1: Negative:     Card 2:Negative:   Card 3: Negative:    Completed by:  Keirstyn Aydt,LPN  HEMOCCULT SENSA DEVELOPER: LOT#:  [id number] EXPIRATION DATE: 2016-05   HEMOCCULT SENSA CARD:  HOO#:[id number]   EXPIRATION DATE: 11/15   CARD CONTROL RESULTS:  POSITIVE:  Postitive NEGATIVE: Negative    ADDITIONAL COMMENTS:

## 2013-12-23 NOTE — Telephone Encounter (Signed)
Results given to patient .Will get a CBC in 7 weeks

## 2014-01-08 ENCOUNTER — Other Ambulatory Visit (INDEPENDENT_AMBULATORY_CARE_PROVIDER_SITE_OTHER): Payer: Self-pay | Admitting: *Deleted

## 2014-01-08 ENCOUNTER — Encounter (INDEPENDENT_AMBULATORY_CARE_PROVIDER_SITE_OTHER): Payer: Self-pay | Admitting: *Deleted

## 2014-01-08 DIAGNOSIS — D509 Iron deficiency anemia, unspecified: Secondary | ICD-10-CM

## 2014-01-09 ENCOUNTER — Telehealth: Payer: Self-pay | Admitting: Family Medicine

## 2014-01-09 ENCOUNTER — Other Ambulatory Visit: Payer: Self-pay | Admitting: Family Medicine

## 2014-01-09 MED ORDER — FLUCONAZOLE 150 MG PO TABS: ORAL_TABLET | ORAL | Status: DC

## 2014-01-09 NOTE — Telephone Encounter (Signed)
Diflucan sent to pharmacy. Patient was notified.

## 2014-01-09 NOTE — Telephone Encounter (Signed)
Patient would like Rx for yeast infection.    [facility]

## 2014-02-03 ENCOUNTER — Ambulatory Visit: Payer: Federal, State, Local not specified - PPO | Admitting: Family Medicine

## 2014-02-17 ENCOUNTER — Telehealth: Payer: Self-pay | Admitting: Family Medicine

## 2014-02-17 MED ORDER — ZOLPIDEM TARTRATE 5 MG PO TABS: 5.0000 mg | ORAL_TABLET | Freq: Every evening | ORAL | Status: DC | PRN

## 2014-02-17 NOTE — Telephone Encounter (Signed)
Patient was notified that script will be faxed to the pharmacy by the end of the day.

## 2014-02-17 NOTE — Telephone Encounter (Signed)
Please do , 90 days with 1 refill, or 30 with 5 refills

## 2014-02-17 NOTE — Telephone Encounter (Signed)
zolpidem (AMBIEN) 5 MG tablet   Pt needs refill on this med sent to [facility]  Call pt when done   Last seen 11/20/13 Last filled w/5 refills 07/17/13

## 2014-02-28 ENCOUNTER — Telehealth: Payer: Self-pay | Admitting: *Deleted

## 2014-02-28 NOTE — Telephone Encounter (Signed)
Contacted pt to say that she needs a pre op clearance for her knee surgery in Feb with [facility]. She said she is going to post-pone her surgery b/c her mom just had a stroke.

## 2014-03-18 ENCOUNTER — Encounter (INDEPENDENT_AMBULATORY_CARE_PROVIDER_SITE_OTHER): Payer: Self-pay

## 2014-04-08 ENCOUNTER — Other Ambulatory Visit: Payer: Self-pay | Admitting: Family Medicine

## 2014-04-10 ENCOUNTER — Telehealth: Payer: Self-pay | Admitting: Family Medicine

## 2014-04-10 ENCOUNTER — Ambulatory Visit: Payer: Self-pay | Admitting: Orthopedic Surgery

## 2014-04-10 ENCOUNTER — Other Ambulatory Visit: Payer: Self-pay | Admitting: *Deleted

## 2014-04-10 MED ORDER — ZOLPIDEM TARTRATE 5 MG PO TABS
5.0000 mg | ORAL_TABLET | Freq: Every evening | ORAL | Status: DC | PRN
Start: 2014-04-10 — End: 2014-09-03

## 2014-04-10 NOTE — Telephone Encounter (Signed)
One with 2 ref

## 2014-04-10 NOTE — Telephone Encounter (Signed)
Med called into [facility].

## 2014-04-10 NOTE — Telephone Encounter (Signed)
Pt called requesting a refill on her ambien.

## 2014-04-28 ENCOUNTER — Encounter (HOSPITAL_COMMUNITY): Admission: RE | Payer: Self-pay | Source: Ambulatory Visit

## 2014-04-28 ENCOUNTER — Inpatient Hospital Stay (HOSPITAL_COMMUNITY)
Admission: RE | Admit: 2014-04-28 | Payer: Federal, State, Local not specified - PPO | Source: Ambulatory Visit | Admitting: Orthopedic Surgery

## 2014-04-28 SURGERY — ARTHROPLASTY, KNEE, TOTAL
Anesthesia: Choice | Site: Knee | Laterality: Left

## 2014-05-22 ENCOUNTER — Telehealth: Payer: Self-pay | Admitting: Family Medicine

## 2014-05-22 NOTE — Telephone Encounter (Signed)
Per Dr. Nicki Reaper, work her into schedule next week b/c his next available is March 21. Discussed warning signs with pt as well and if she had any, go to ER. Pt notified and verbalized understanding.

## 2014-05-22 NOTE — Telephone Encounter (Signed)
Pt has been having issues with her BP, she has been monitoring it  For the past few weeks. The highest number has been 168/94, 166/110  And mid range from there. When she woke at 6:30 this morning it was  164/106. She is not on any BP meds, does have a family history of high BP. She does not have any new stressors in her life, no changes to make the BP  Elevated. Please advise the patient if she needs to be concerned at this point Or to continue to monitor. She is having headaches with it but no dizziness as  Of yet   [facility]

## 2014-05-26 ENCOUNTER — Ambulatory Visit: Payer: Federal, State, Local not specified - PPO | Admitting: Family Medicine

## 2014-05-26 DIAGNOSIS — Z029 Encounter for administrative examinations, unspecified: Secondary | ICD-10-CM

## 2014-05-28 ENCOUNTER — Encounter: Payer: Self-pay | Admitting: Family Medicine

## 2014-05-28 ENCOUNTER — Ambulatory Visit (INDEPENDENT_AMBULATORY_CARE_PROVIDER_SITE_OTHER): Payer: Federal, State, Local not specified - PPO | Admitting: Family Medicine

## 2014-05-28 VITALS — BP 158/98 | Ht 64.0 in | Wt 231.0 lb

## 2014-05-28 DIAGNOSIS — R7309 Other abnormal glucose: Secondary | ICD-10-CM | POA: Diagnosis not present

## 2014-05-28 DIAGNOSIS — D509 Iron deficiency anemia, unspecified: Secondary | ICD-10-CM | POA: Diagnosis not present

## 2014-05-28 DIAGNOSIS — R7303 Prediabetes: Secondary | ICD-10-CM

## 2014-05-28 DIAGNOSIS — E785 Hyperlipidemia, unspecified: Secondary | ICD-10-CM

## 2014-05-28 DIAGNOSIS — I10 Essential (primary) hypertension: Secondary | ICD-10-CM

## 2014-05-28 DIAGNOSIS — G4733 Obstructive sleep apnea (adult) (pediatric): Secondary | ICD-10-CM | POA: Diagnosis not present

## 2014-05-28 DIAGNOSIS — Z9989 Dependence on other enabling machines and devices: Secondary | ICD-10-CM | POA: Insufficient documentation

## 2014-05-28 DIAGNOSIS — E038 Other specified hypothyroidism: Secondary | ICD-10-CM

## 2014-05-28 MED ORDER — LISINOPRIL 5 MG PO TABS: 5.0000 mg | ORAL_TABLET | Freq: Every day | ORAL | Status: DC

## 2014-05-28 NOTE — Progress Notes (Signed)
   Subjective:    Patient ID: Karen Vazquez, female    DOB: 10/18/1955, 59 y.o.   MRN: [id number]  Hypertension This is a chronic problem. Associated symptoms include anxiety, headaches and shortness of breath.    She does not use her sleep apnea she is been told she should She has family history hypertension Has been going through a lot of stress recently her mother passed away today. She is noticed increased blood pressure headaches not feeling good over the past few weeks she denies high fever chills sweats.  Review of Systems  Respiratory: Positive for shortness of breath.   Neurological: Positive for headaches.       Objective:   Physical Exam  Lungs clear heart trigger pulse normal blood pressure elevated taken in both arms. Approximately 120/92 on the left side 124/94 and right side      Assessment & Plan:  HTN-patient is to get a home cuff with a large cuff for the upper arm automated. In addition this start lisinopril 5 mg take a half tablet daily for the next week then go up to 1 tablet daily follow-up in 2 weeks' time to recheck blood pressure.  Lab work was ordered we will look over this we see her in a couple weeks time if blood pressure doing well then more than likely give released otherwise she has an appointment in early April for presurgical release

## 2014-05-30 NOTE — Progress Notes (Signed)
Done

## 2014-06-04 ENCOUNTER — Telehealth: Payer: Self-pay | Admitting: Family Medicine

## 2014-06-04 NOTE — Telephone Encounter (Signed)
Pt's zolpidem (AMBIEN) 5 MG tablet prior auth was APPROVED through her [employer], expires 05/23/15

## 2014-06-09 ENCOUNTER — Ambulatory Visit: Payer: Federal, State, Local not specified - PPO | Admitting: Family Medicine

## 2014-06-11 ENCOUNTER — Ambulatory Visit (INDEPENDENT_AMBULATORY_CARE_PROVIDER_SITE_OTHER): Payer: Federal, State, Local not specified - PPO | Admitting: Family Medicine

## 2014-06-11 ENCOUNTER — Encounter: Payer: Self-pay | Admitting: Family Medicine

## 2014-06-11 VITALS — BP 132/90 | Ht 64.0 in | Wt 231.0 lb

## 2014-06-11 DIAGNOSIS — I10 Essential (primary) hypertension: Secondary | ICD-10-CM

## 2014-06-11 MED ORDER — HYDROCHLOROTHIAZIDE 25 MG PO TABS
25.0000 mg | ORAL_TABLET | Freq: Every day | ORAL | Status: DC
Start: 2014-06-11 — End: 2014-11-06

## 2014-06-11 MED ORDER — POTASSIUM CHLORIDE CRYS ER 20 MEQ PO TBCR: 20.0000 meq | EXTENDED_RELEASE_TABLET | Freq: Every day | ORAL | Status: DC

## 2014-06-11 NOTE — Progress Notes (Signed)
   Subjective:    Patient ID: Karen Vazquez, female    DOB: 1955-06-05, 59 y.o.   MRN: [id number]  Hypertension This is a new problem. The current episode started 1 to 4 weeks ago. Treatments tried: lisinopril.  started lisinopril about 2 weeks ago. Started having a dry cough since starting the med.  Pt is doing some walking. Trying to eat a healthy diet.  Pt states no concerns today.   PMH benign has upcoming knee surgery in late April  Review of Systems Denies chest tightness pressure pain shortness of breath denies swelling    Objective:   Physical Exam  Lungs clear hearts regular pulse normal extremities no edema blood pressure faint but 140/86      Assessment & Plan:  HTN decent control with lisinopril but does have side effects with lisinopril. Stop lisinopril Start HCTZ and potassium Follow-up in several weeks for preoperative clearance May had have to add Norvasc if not at goal.

## 2014-06-13 LAB — CBC WITH DIFFERENTIAL/PLATELET
BASOS ABS: 0 10*3/uL (ref 0.0–0.2)
Basos: 0 %
Eos: 2 %
Eosinophils Absolute: 0.1 10*3/uL (ref 0.0–0.4)
HCT: 40.5 % (ref 34.0–46.6)
Hemoglobin: 12.7 g/dL (ref 11.1–15.9)
IMMATURE GRANS (ABS): 0 10*3/uL (ref 0.0–0.1)
IMMATURE GRANULOCYTES: 0 %
LYMPHS ABS: 1.8 10*3/uL (ref 0.7–3.1)
Lymphs: 37 %
MCH: 26.1 pg — ABNORMAL LOW (ref 26.6–33.0)
MCHC: 31.4 g/dL — ABNORMAL LOW (ref 31.5–35.7)
MCV: 83 fL (ref 79–97)
MONOCYTES: 8 %
Monocytes Absolute: 0.4 10*3/uL (ref 0.1–0.9)
NEUTROS PCT: 53 %
Neutrophils Absolute: 2.6 10*3/uL (ref 1.4–7.0)
PLATELETS: 186 10*3/uL (ref 150–379)
RBC: 4.87 x10E6/uL (ref 3.77–5.28)
RDW: 14.7 % (ref 12.3–15.4)
WBC: 5 10*3/uL (ref 3.4–10.8)

## 2014-06-13 LAB — BASIC METABOLIC PANEL
BUN / CREAT RATIO: 13 (ref 9–23)
BUN: 12 mg/dL (ref 6–24)
CO2: 23 mmol/L (ref 18–29)
Calcium: 9.4 mg/dL (ref 8.7–10.2)
Chloride: 103 mmol/L (ref 97–108)
Creatinine, Ser: 0.92 mg/dL (ref 0.57–1.00)
GFR, EST AFRICAN AMERICAN: 79 mL/min/{1.73_m2} (ref >59)
GFR, EST NON AFRICAN AMERICAN: 68 mL/min/{1.73_m2} (ref >59)
Glucose: 89 mg/dL (ref 65–99)
Potassium: 4.3 mmol/L (ref 3.5–5.2)
Sodium: 142 mmol/L (ref 134–144)

## 2014-06-13 LAB — HEMOGLOBIN A1C
ESTIMATED AVERAGE GLUCOSE: 108 mg/dL
Hgb A1c MFr Bld: 5.4 % (ref 4.8–5.6)

## 2014-06-13 LAB — LIPID PANEL
CHOL/HDL RATIO: 4.5 ratio — AB (ref 0.0–4.4)
CHOLESTEROL TOTAL: 188 mg/dL (ref 100–199)
HDL: 42 mg/dL (ref >39)
LDL Calculated: 125 mg/dL — ABNORMAL HIGH (ref 0–99)
Triglycerides: 105 mg/dL (ref 0–149)
VLDL Cholesterol Cal: 21 mg/dL (ref 5–40)

## 2014-06-13 LAB — FERRITIN: FERRITIN: 15 ng/mL (ref 15–150)

## 2014-06-13 LAB — TSH: TSH: 1.47 u[IU]/mL (ref 0.450–4.500)

## 2014-06-15 ENCOUNTER — Encounter: Payer: Self-pay | Admitting: Family Medicine

## 2014-06-28 ENCOUNTER — Other Ambulatory Visit: Payer: Self-pay | Admitting: Family Medicine

## 2014-06-30 ENCOUNTER — Encounter: Payer: Self-pay | Admitting: Family Medicine

## 2014-06-30 ENCOUNTER — Ambulatory Visit (INDEPENDENT_AMBULATORY_CARE_PROVIDER_SITE_OTHER): Payer: Federal, State, Local not specified - PPO | Admitting: Family Medicine

## 2014-06-30 VITALS — BP 128/84 | Ht 64.0 in | Wt 231.5 lb

## 2014-06-30 DIAGNOSIS — M170 Bilateral primary osteoarthritis of knee: Secondary | ICD-10-CM | POA: Diagnosis not present

## 2014-06-30 DIAGNOSIS — E785 Hyperlipidemia, unspecified: Secondary | ICD-10-CM | POA: Diagnosis not present

## 2014-06-30 DIAGNOSIS — R079 Chest pain, unspecified: Secondary | ICD-10-CM | POA: Diagnosis not present

## 2014-06-30 DIAGNOSIS — I10 Essential (primary) hypertension: Secondary | ICD-10-CM

## 2014-06-30 NOTE — Progress Notes (Signed)
   Subjective:    Patient ID: Karen Vazquez, female    DOB: [birth year], 59 y.o.   MRN: [id number]  HPI  Patient arrives for surgical clearance for left knee surgery . Has had same surgery on right knee a few years ago. This patient is not been able to do much walking or exercise. Patient denies any hemoptysis wheezing shortness of breath bleeding problems denies rectal bleeding abdominal pain  She does state over the past few days she's had chest pressure and tightness along with intermittent pain without activity she does have risk factors for heart disease. She had recent lab work done which showed LDL mildly elevated, liver function and thyroid kidney functions and glucose all have been normal  Patient does have history restless legs and reflux issues. Denies any flareups or reflux. Patient also states blood pressure has been good in taking her thyroid medicine as directed Review of Systems  Constitutional: Negative for activity change, appetite change and fatigue.  HENT: Negative for congestion.   Respiratory: Negative for cough.   Cardiovascular: Positive for chest pain.  Gastrointestinal: Negative for abdominal pain.  Endocrine: Negative for polydipsia and polyphagia.  Neurological: Negative for weakness.  Psychiatric/Behavioral: Negative for confusion.       Objective:   Physical Exam  Constitutional: She appears well-nourished. No distress.  Cardiovascular: Normal rate, regular rhythm and normal heart sounds.   No murmur heard. Pulmonary/Chest: Effort normal and breath sounds normal. No respiratory distress.  Musculoskeletal: She exhibits no edema.  Lymphadenopathy:    She has no cervical adenopathy.  Neurological: She is alert. She exhibits normal muscle tone.  Psychiatric: Her behavior is normal.  Vitals reviewed.  EKG does not show any significant changes      Assessment & Plan:  1. [facility] blood pressure under good control continue current measures blood  pressure under good control continue current measuresl hypertens PR ELECTROCARDIOGRAM, COMPLETE  2. Osteoarthritis of both knees, unspecified osteoarthritis type   3. Hyperlipidemia LDL 125, watch diet recheck in 3 months if still up then meds, if cardio check finds heart disease then meds  4. Chest pain, unspecified chest pain type I do believe it is in the best patient patient's interest to go ahead and be seen by cardiology this patient will need stress testing in order to make sure that she is perfectly fine for surgery - PR ELECTROCARDIOGRAM, COMPLETE - Ambulatory referral to Cardiology   I do believe that this patient has medical clearance for surgery but does not have cardiac clearance currently once cardiology does evaluation if everything is good then she will have cardiac clearance as well  Patient was strongly encouraged to follow every instruction of post surgery to avoid complications including DVTs.

## 2014-07-09 NOTE — Progress Notes (Signed)
Patient ID: Karen Vazquez, female   DOB: 12-Dec-1955, 59 y.o.   MRN: [id number]     Cardiology Office Note   Date:  8/85/0277   ID:  Karen Vazquez, DOB [birth year], MRN [id number]  PCP:  Sallee Lange, MD  Cardiologist:   Jenkins Rouge, MD   No chief complaint on file.     History of Present Illness:  Karen Vazquez is a 59 y.o. female who presents for surgical clearance  for left knee surgery . Has had same surgery on right knee a few years ago.This patient is not been able to do much walking or exercise.Seen by Dr Wolfgang Phoenix 06/30/14 and indicated  she's had chest pressure and tightness along with intermittent pain without activity She is Rx for hypertension  She had recent lab work done which showed LDL mildly elevated, liver function and thyroid kidney functions and glucose all have been normal  Surgery scheduled for 5/23 with Dr Maureen Ralphs  Pain typically exertional Worse since mother died in 2022-07-12 and she thinks its related to stress.  Did require a blood transfusion after last knee surgery     Past Medical History  Diagnosis Date  . Hypothyroidism   . Blood transfusion Jul 11, 2009    at [facility]  . Arthritis   . Restless leg syndrome     Past Surgical History  Procedure Laterality Date  . Joint replacement  11-Jul-2009    rt knee  . Tubal ligation    . Appendectomy    . Tonsillectomy    . Vaginal hysterectomy  12/29/2010    Procedure: HYSTERECTOMY VAGINAL;  Surgeon: Daria Pastures;  Location: [facility];  Service: Gynecology;  Laterality: N/A;  . Bladder suspension  12/29/2010    Procedure: TRANSVAGINAL TAPE (TVT) PROCEDURE;  Surgeon: Daria Pastures;  Location: [facility];  Service: Gynecology;  Laterality: N/A;  . Cystocele repair  12/29/2010    Procedure: ANTERIOR REPAIR (CYSTOCELE);  Surgeon: Daria Pastures;  Location: [facility];  Service: Gynecology;  Laterality: N/A;  . Cystoscopy  12/29/2010    Procedure: CYSTOSCOPY;  Surgeon: Daria Pastures;  Location: [facility];  Service: Gynecology;   Laterality: N/A;  . Replacement total knee Right 2009-07-11     Current Outpatient Prescriptions  Medication Sig Dispense Refill  . ARMOUR THYROID 15 MG tablet TAKE ONE (1) TABLET EACH DAY 30 tablet 1  . ARMOUR THYROID 30 MG tablet TAKE ONE (1) TABLET EACH DAY 30 tablet 1  . hydrochlorothiazide (HYDRODIURIL) 25 MG tablet Take 1 tablet (25 mg total) by mouth daily. 30 tablet 5  . pantoprazole (PROTONIX) 40 MG tablet Take 40 mg by mouth as needed. Rarely takes    . potassium chloride SA (K-DUR,KLOR-CON) 20 MEQ tablet Take 1 tablet (20 mEq total) by mouth daily. 30 tablet 5  . rOPINIRole (REQUIP) 2 MG tablet TAKE 1 TABLET EVERY EVENING(NEW DOSE). PLEASE MAKE AN APPOINTMENT WITH YOUR      DOCTOR. 90 tablet 0  . zolpidem (AMBIEN) 5 MG tablet Take 1 tablet (5 mg total) by mouth at bedtime as needed for sleep. 90 tablet 0   No current facility-administered medications for this visit.    Allergies:   Lisinopril    Social History:  The patient  reports that she has never smoked. She does not have any smokeless tobacco history on file. She reports that she does not drink alcohol or use illicit drugs.   Family History:  The patient's family history includes COPD in her father;  Cancer in her paternal grandmother; Diabetes in her paternal grandfather; Heart disease in her father; Hyperlipidemia in her mother; Hypertension in her mother; Stroke in her mother.    ROS:  Please see the history of present illness.   Otherwise, review of systems are positive for none.   All other systems are reviewed and negative.    PHYSICAL EXAM: VS:  LMP 05/24/2010 , BMI There is no weight on file to calculate BMI. Affect appropriate Healthy:  appears stated age 59: normal Neck supple with no adenopathy JVP normal no bruits no thyromegaly Lungs clear with no wheezing and good diaphragmatic motion Heart:  S1/S2 no murmur, no rub, gallop or click PMI normal Abdomen: benighn, BS positve, no tenderness, no AAA no  bruit.  No HSM or HJR Distal pulses intact with no bruits No edema Neuro non-focal Skin warm and dry No muscular weakness    EKG:   SR rate 77 LAD otherwise normal    Recent Labs: 06/12/2014: BUN 12; Creatinine 0.92; Hemoglobin 12.7; Platelets 186; Potassium 4.3; Sodium 142; TSH 1.470    Lipid Panel    Component Value Date/Time   CHOL 188 06/12/2014 0808   CHOL 176 11/21/2013 0712   TRIG 105 06/12/2014 0808   HDL 42 06/12/2014 0808   HDL 42 11/21/2013 0712   CHOLHDL 4.5* 06/12/2014 0808   CHOLHDL 4.2 11/21/2013 0712   VLDL 18 11/21/2013 0712   LDLCALC 125* 06/12/2014 0808   LDLCALC 116* 11/21/2013 0712      Wt Readings from Last 3 Encounters:  06/30/14 231 lb 8 oz (105.008 kg)  06/11/14 231 lb (104.781 kg)  05/28/14 231 lb (104.781 kg)      Other studies Reviewed: Additional studies/ records that were reviewed today include: [place] office notes.    ASSESSMENT AND PLAN:  1.  Preop:  Chest pain mostly exertional unable to walk on treadmill due to knee pain Moderate risk ortho surgery f/u lexiscan myovue 2. Chol:  No documented vascular disease under 130 LDL  Diet Rx 3. HTN imporved continue diuretic   Current medicines are reviewed at length with the patient today.  The patient does not have concerns regarding medicines.  The following changes have been made:  no change  Labs/ tests ordered today include: Lexiscan myovue  No orders of the defined types were placed in this encounter.     Disposition:   FU with PRN     Signed, Jenkins Rouge, MD  07/10/2014 10:49 AM    [facility] [address], [place], [place]  [address] Phone: [phone]; Fax: [phone]

## 2014-07-10 ENCOUNTER — Other Ambulatory Visit: Payer: Self-pay | Admitting: Family Medicine

## 2014-07-10 ENCOUNTER — Encounter: Payer: Self-pay | Admitting: Cardiovascular Disease

## 2014-07-10 ENCOUNTER — Ambulatory Visit (INDEPENDENT_AMBULATORY_CARE_PROVIDER_SITE_OTHER): Payer: Federal, State, Local not specified - PPO | Admitting: Cardiovascular Disease

## 2014-07-10 ENCOUNTER — Encounter: Payer: Self-pay | Admitting: *Deleted

## 2014-07-10 DIAGNOSIS — Z01818 Encounter for other preprocedural examination: Secondary | ICD-10-CM | POA: Diagnosis not present

## 2014-07-10 NOTE — Patient Instructions (Addendum)
Your physician recommends that you schedule a follow-up appointment in:As needed  Your physician has requested that you have a lexiscan myoview. For further information please visit [email or web address]. Please follow instruction sheet, as given.  Your physician recommends that you continue on your current medications as directed. Please refer to the Current Medication list given to you today.  Thank you for choosing [facility]!

## 2014-07-14 ENCOUNTER — Encounter: Payer: Self-pay | Admitting: Family Medicine

## 2014-07-15 ENCOUNTER — Encounter (HOSPITAL_COMMUNITY)
Admission: RE | Admit: 2014-07-15 | Discharge: 2014-07-15 | Disposition: A | Discharge status: Home / Self Care | Payer: Federal, State, Local not specified - PPO | Source: Ambulatory Visit | Attending: Cardiovascular Disease | Admitting: Cardiovascular Disease

## 2014-07-15 ENCOUNTER — Ambulatory Visit (HOSPITAL_COMMUNITY)
Admission: RE | Admit: 2014-07-15 | Discharge: 2014-07-15 | Disposition: A | Discharge status: Home / Self Care | Payer: Federal, State, Local not specified - PPO | Source: Ambulatory Visit | Attending: Cardiovascular Disease | Admitting: Cardiovascular Disease

## 2014-07-15 ENCOUNTER — Encounter (HOSPITAL_COMMUNITY): Payer: Self-pay

## 2014-07-15 DIAGNOSIS — Z01818 Encounter for other preprocedural examination: Secondary | ICD-10-CM

## 2014-07-15 DIAGNOSIS — R0789 Other chest pain: Secondary | ICD-10-CM | POA: Diagnosis not present

## 2014-07-15 DIAGNOSIS — R079 Chest pain, unspecified: Secondary | ICD-10-CM | POA: Diagnosis not present

## 2014-07-15 HISTORY — DX: Essential (primary) hypertension: I10

## 2014-07-15 MED ORDER — REGADENOSON 0.4 MG/5ML IV SOLN
INTRAVENOUS | Status: AC
  Administered 2014-07-15: 0.4 mg via INTRAVENOUS
  Filled 2014-07-15: qty 5

## 2014-07-15 MED ORDER — TECHNETIUM TC 99M SESTAMIBI - CARDIOLITE
10.0000 | Freq: Once | INTRAVENOUS | Status: AC | PRN
  Administered 2014-07-15: 10 via INTRAVENOUS

## 2014-07-15 MED ORDER — TECHNETIUM TC 99M SESTAMIBI GENERIC - CARDIOLITE
30.0000 | Freq: Once | INTRAVENOUS | Status: AC | PRN
  Administered 2014-07-15: 30 via INTRAVENOUS

## 2014-07-15 MED ORDER — REGADENOSON 0.4 MG/5ML IV SOLN
0.4000 mg | Freq: Once | INTRAVENOUS | Status: AC | PRN
  Administered 2014-07-15: 0.4 mg via INTRAVENOUS

## 2014-07-15 MED ORDER — SODIUM CHLORIDE 0.9 % IJ SOLN
10.0000 mL | INTRAMUSCULAR | Status: DC | PRN
  Administered 2014-07-15: 10 mL via INTRAVENOUS
  Filled 2014-07-15: qty 10

## 2014-07-15 MED ORDER — SODIUM CHLORIDE 0.9 % IJ SOLN
INTRAMUSCULAR | Status: AC
  Administered 2014-07-15: 10 mL via INTRAVENOUS
  Filled 2014-07-15: qty 3

## 2014-07-15 NOTE — Progress Notes (Signed)
Stress Lab Nurses Notes - [place] 0/93/2671 Reason for doing test: Surgical Clearance and chest pressure with tightness Type of test: Wille Glaser Nurse performing test: Gerrit Halls, RN Nuclear Medicine Tech: Melburn Hake Echo Tech: Not Applicable MD performing test: Branch/K.Purcell Nails NP Family MD: Iverson Alamin Test explained and consent signed: Yes.   IV started: Saline lock flushed, No redness or edema and Saline lock started in radiology Symptoms: None Treatment/Intervention: None Reason test stopped: protocol completed After recovery IV was: Discontinued via X-ray tech and No redness or edema Patient to return to Nuc. Med at : 11:45 Patient discharged: Home Patient's Condition upon discharge was: stable Comments: During test BP 156/76 & HR 94.  Recovery BP 161/80 & HR 82.  Symptoms resolved in recovery. Geanie Cooley T

## 2014-07-22 ENCOUNTER — Ambulatory Visit: Payer: Self-pay | Admitting: Orthopedic Surgery

## 2014-07-22 NOTE — H&P (Signed)
Karen Vazquez DOB: 13-Jan-1956 Married / Language: English / Race: White Female Date of Admission:  08/11/2014 CC:  Left Knee Pain History of Present Illness The patient is a 59 year old female who comes in for a preoperative History and Physical. The patient is scheduled for a left total knee arthroplasty to be performed by Dr. Dione Plover. Aluisio, MD at [facility] on 08-11-2014. The patient is a 59 year old female who is being followed for their left knee pain and osteoarthritis. They are now a couple of months out from Synvisc series. Symptoms reported include: pain, aching, instability and difficulty ambulating. The patient feels that they are doing poorly and report their pain level to be moderate to severe. Current treatment includes: NSAIDs (meloxicam; every other day). The patient has not gotten any relief of their symptoms with viscosupplementation. She notes that she has started to have pain in her hip due to compensating for the knee. Her left knee is getting progressively worse. She said it is hurting as bad or worse than the right knee did several years ago when she had that replaced. She has pain with activities and at rest. She did not have a good response to injections. She is ready to proceed to with surgery for the left knee at this time.  They have been treated conservatively in the past for the above stated problem and despite conservative measures, they continue to have progressive pain and severe functional limitations and dysfunction. They have failed non-operative management including home exercise, medications, and injections. It is felt that they would benefit from undergoing total joint replacement. Risks and benefits of the procedure have been discussed with the patient and they elect to proceed with surgery. There are no active contraindications to surgery such as ongoing infection or rapidly progressive neurological disease.  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 father Congestive Heart Failure father Cancer father and grandmother fathers side Osteoarthritis mother Heart disease in female family member before age 48 Cerebrovascular Accident mother, father and grandmother mothers side Hypertension mother and brother  Social History Illicit drug use no Pain Contract no Alcohol use never consumed alcohol Tobacco use Never smoker. 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 Zolpidem Tartrate (5MG  Tablet, Oral at bedtime, as needed) Active. Armour Thyroid (45mg  Oral) Specific dose unknown - Active. Hydrochlorothiazide (25MG  Tablet, Oral) Active. Protonix (40MG  Tablet DR, Oral as needed) Active. Potassium Chloride (20MEQ Tablet ER, Oral) Active.   Past Surgical History  Tonsillectomy Date: 21. Arthroscopy of Knee right Hysterectomy Date: 2012. partial (non-cancerous), with bladder tack Breast Mass; Local Excision Date: 7. left Total Knee Replacement Date: 2011. right Tubal Ligation Date: 39. Appendectomy  Review of Systems  General  Not Present- Chills, Fatigue, Fever, Memory Loss, Night Sweats, Weight  Gain and Weight Loss. Skin Not Present- Eczema, Hives, Itching, Lesions and Rash. HEENT Not Present- Dentures, Double Vision, Headache, Hearing Loss, Tinnitus and Visual Loss. Respiratory Not Present- Allergies, Chronic Cough, Coughing up blood, Shortness of breath at rest and Shortness of breath with exertion. Cardiovascular Not Present- Chest Pain, Difficulty Breathing Lying Down, Murmur, Palpitations, Racing/skipping heartbeats and Swelling. Gastrointestinal Not Present- Abdominal Pain, Bloody Stool, Constipation, Diarrhea, Difficulty Swallowing, Heartburn, Jaundice, Loss of appetitie, Nausea and Vomiting. Female Genitourinary Present- Urinating at Night. Not Present- Blood in Urine, Discharge, Flank Pain, Incontinence, Painful Urination, Urgency, Urinary frequency, Urinary Retention and Weak urinary stream. Musculoskeletal Present- Joint Pain, Muscle Pain and Muscle Weakness. Not Present- Back Pain, Joint Swelling, Morning Stiffness and Spasms. Neurological Not Present- Blackout spells, Difficulty with balance, Dizziness, Paralysis, Tremor and Weakness. Psychiatric Not Present- Insomnia.  Vitals Weight: 229.13 lb Height: 64in Body Surface Area: 2.07 m Body Mass Index: 39.33 kg/m  BP: 134/86 (Sitting, Right Arm, Standard)  Physical Exam General Mental Status -Alert, cooperative and good historian. General Appearance-pleasant, Not in acute distress. Orientation-Oriented X3. Build & Nutrition-Well nourished and Well developed.  Head and Neck Head-normocephalic, atraumatic . Neck Global Assessment - supple, no bruit auscultated on the right, no bruit auscultated on the left.  Eye Vision-Wears corrective lenses. Pupil - Bilateral-Regular and Round. Motion - Bilateral-EOMI.  Chest and Lung Exam Auscultation Breath sounds - clear at anterior chest wall and clear at posterior chest wall.  Adventitious sounds - No Adventitious sounds.  Cardiovascular Auscultation Rhythm - Regular rate and rhythm. Heart Sounds - S1 WNL and S2 WNL. Murmurs & Other Heart Sounds - Auscultation of the heart reveals - No Murmurs.  Abdomen Inspection Contour - Generalized mild distention. Palpation/Percussion Tenderness - Abdomen is non-tender to palpation. Rigidity (guarding) - Abdomen is soft. Auscultation Auscultation of the abdomen reveals - Bowel sounds normal.  Female Genitourinary Note: Not done, not pertinent to present illness   Musculoskeletal Note: She is well developed female in no distress. The left knee shows no effusion. There is moderate crepitus on range of motion of the knee. Range is about 5-125. She is tender medial greater than lateral. No instability is noted. Pulse, sensation and motor are intact.  RADIOGRAPHS: AP and lateral of the knees show the prosthesis on the right is in excellent position. No periprosthetic abnormalities. On the left she has bone on bone arthritis in the medial and patellofemoral compartments.   Assessment & Plan  Primary osteoarthritis of knee, left Note:Surgical Plans: Left Total Knee Replacement  Disposition: Home  PCP: Dr. Sallee Lange Cards: Dr. Jenkins Rouge - The patient has recently underwent a cardiac scan and was told that is was negative and that she could proceed with surgery.  IV TXA  Anesthesia Issues: None  Signed electronically by Joelene Millin, III PA-C

## 2014-07-29 ENCOUNTER — Ambulatory Visit: Payer: Self-pay | Admitting: Orthopedic Surgery

## 2014-07-29 NOTE — Progress Notes (Signed)
Preoperative surgical orders have been place into the Epic hospital system for Karen Vazquez on 1/46/4314, 8:34 AM  by Mickel Crow for surgery on 08-11-2014.  Preop Total Knee orders including Experal, IV Tylenol, and IV Decadron as long as there are no contraindications to the above medications. Arlee Muslim, PA-C

## 2014-08-04 NOTE — Patient Instructions (Addendum)
Karen Vazquez  [birth year]   Your procedure is scheduled on: 08/11/2014    Report to [facility] Main  Entrance and follow signs to               [place] at        [place].  Call this number if you have problems the morning of surgery [phone]   Remember: ONLY 1 PERSON MAY GO WITH YOU TO SHORT STAY TO GET  READY MORNING OF [place].  Do not eat food or drink liquids :After Midnight.     Take these medicines the morning of surgery with A SIP OF WATER:   Armour Thyroid, Protonix                                You may not have any metal on your body including hair pins and              piercings  Do not wear jewelry, make-up, lotions, powders or perfumes, deodorant             Do not wear nail polish.  Do not shave  48 hours prior to surgery.     Do not bring valuables to the hospital. [place].  Contacts, dentures or bridgework may not be worn into surgery.  Leave suitcase in the car. After surgery it may be brought to your room.       Special Instructions: coughing and deep breathing exercises, leg exercises               Bring C-pap mask and tubing             Please read over the following fact sheets you were given: _____________________________________________________________________             [facility] - Preparing for Surgery Before surgery, you can play an important role.  Because skin is not sterile, your skin needs to be as free of germs as possible.  You can reduce the number of germs on your skin by washing with CHG (chlorahexidine gluconate) soap before surgery.  CHG is an antiseptic cleaner which kills germs and bonds with the skin to continue killing germs even after washing. Please DO NOT use if you have an allergy to CHG or antibacterial soaps.  If your skin becomes reddened/irritated stop using the CHG and inform your nurse when you arrive at Short Stay. Do not shave  (including legs and underarms) for at least 48 hours prior to the first CHG shower.  You may shave your face/neck. Please follow these instructions carefully:  1.  Shower with CHG Soap the night before surgery and the  morning of Surgery.  2.  If you choose to wash your hair, wash your hair first as usual with your  normal  shampoo.  3.  After you shampoo, rinse your hair and body thoroughly to remove the  shampoo.                           4.  Use CHG as you would any other liquid soap.  You can apply chg directly  to the skin and wash  Gently with a scrungie or clean washcloth.  5.  Apply the CHG Soap to your body ONLY FROM THE NECK DOWN.   Do not use on face/ open                           Wound or open sores. Avoid contact with eyes, ears mouth and genitals (private parts).                       Wash face,  Genitals (private parts) with your normal soap.             6.  Wash thoroughly, paying special attention to the area where your surgery  will be performed.  7.  Thoroughly rinse your body with warm water from the neck down.  8.  DO NOT shower/wash with your normal soap after using and rinsing off  the CHG Soap.                9.  Pat yourself dry with a clean towel.            10.  Wear clean pajamas.            11.  Place clean sheets on your bed the night of your first shower and do not  sleep with pets. Day of Surgery : Do not apply any lotions/deodorants the morning of surgery.  Please wear clean clothes to the hospital/surgery center.  FAILURE TO FOLLOW THESE INSTRUCTIONS MAY RESULT IN THE CANCELLATION OF YOUR SURGERY PATIENT SIGNATURE_________________________________  NURSE SIGNATURE__________________________________  ________________________________________________________________________  WHAT IS A BLOOD TRANSFUSION? Blood Transfusion Information  A transfusion is the replacement of blood or some of its parts. Blood is made up of multiple cells which  provide different functions.  Red blood cells carry oxygen and are used for blood loss replacement.  White blood cells fight against infection.  Platelets control bleeding.  Plasma helps clot blood.  Other blood products are available for specialized needs, such as hemophilia or other clotting disorders. BEFORE THE TRANSFUSION  Who gives blood for transfusions?   Healthy volunteers who are fully evaluated to make sure their blood is safe. This is blood bank blood. Transfusion therapy is the safest it has ever been in the practice of medicine. Before blood is taken from a donor, a complete history is taken to make sure that person has no history of diseases nor engages in risky social behavior (examples are intravenous drug use or sexual activity with multiple partners). The donor's travel history is screened to minimize risk of transmitting infections, such as malaria. The donated blood is tested for signs of infectious diseases, such as HIV and hepatitis. The blood is then tested to be sure it is compatible with you in order to minimize the chance of a transfusion reaction. If you or a relative donates blood, this is often done in anticipation of surgery and is not appropriate for emergency situations. It takes many days to process the donated blood. RISKS AND COMPLICATIONS Although transfusion therapy is very safe and saves many lives, the main dangers of transfusion include:  1. Getting an infectious disease. 2. Developing a transfusion reaction. This is an allergic reaction to something in the blood you were given. Every precaution is taken to prevent this. The decision to have a blood transfusion has been considered carefully by your caregiver before blood is given. Blood is not given unless the benefits outweigh  the risks. AFTER THE TRANSFUSION  Right after receiving a blood transfusion, you will usually feel much better and more energetic. This is especially true if your red blood cells  have gotten low (anemic). The transfusion raises the level of the red blood cells which carry oxygen, and this usually causes an energy increase.  The nurse administering the transfusion will monitor you carefully for complications. HOME CARE INSTRUCTIONS  No special instructions are needed after a transfusion. You may find your energy is better. Speak with your caregiver about any limitations on activity for underlying diseases you may have. SEEK MEDICAL CARE IF:   Your condition is not improving after your transfusion.  You develop redness or irritation at the intravenous (IV) site. SEEK IMMEDIATE MEDICAL CARE IF:  Any of the following symptoms occur over the next 12 hours:  Shaking chills.  You have a temperature by mouth above 102 F (38.9 C), not controlled by medicine.  Chest, back, or muscle pain.  People around you feel you are not acting correctly or are confused.  Shortness of breath or difficulty breathing.  Dizziness and fainting.  You get a rash or develop hives.  You have a decrease in urine output.  Your urine turns a dark color or changes to pink, red, or brown. Any of the following symptoms occur over the next 10 days:  You have a temperature by mouth above 102 F (38.9 C), not controlled by medicine.  Shortness of breath.  Weakness after normal activity.  The white part of the eye turns yellow (jaundice).  You have a decrease in the amount of urine or are urinating less often.  Your urine turns a dark color or changes to pink, red, or brown. Document Released: 03/04/2000 Document Revised: 05/30/2011 Document Reviewed: 10/22/2007 ExitCare Patient Information 2014 [place].  _______________________________________________________________________  Incentive Spirometer  An incentive spirometer is a tool that can help keep your lungs clear and active. This tool measures how well you are filling your lungs with each breath. Taking long deep  breaths may help reverse or decrease the chance of developing breathing (pulmonary) problems (especially infection) following:  A long period of time when you are unable to move or be active. BEFORE THE PROCEDURE   If the spirometer includes an indicator to show your best effort, your nurse or respiratory therapist will set it to a desired goal.  If possible, sit up straight or lean slightly forward. Try not to slouch.  Hold the incentive spirometer in an upright position. INSTRUCTIONS FOR USE  3. Sit on the edge of your bed if possible, or sit up as far as you can in bed or on a chair. 4. Hold the incentive spirometer in an upright position. 5. Breathe out normally. 6. Place the mouthpiece in your mouth and seal your lips tightly around it. 7. Breathe in slowly and as deeply as possible, raising the piston or the ball toward the top of the column. 8. Hold your breath for 3-5 seconds or for as long as possible. Allow the piston or ball to fall to the bottom of the column. 9. Remove the mouthpiece from your mouth and breathe out normally. 10. Rest for a few seconds and repeat Steps 1 through 7 at least 10 times every 1-2 hours when you are awake. Take your time and take a few normal breaths between deep breaths. 11. The spirometer may include an indicator to show your best effort. Use the indicator as a goal to work  toward during each repetition. 12. After each set of 10 deep breaths, practice coughing to be sure your lungs are clear. If you have an incision (the cut made at the time of surgery), support your incision when coughing by placing a pillow or rolled up towels firmly against it. Once you are able to get out of bed, walk around indoors and cough well. You may stop using the incentive spirometer when instructed by your caregiver.  RISKS AND COMPLICATIONS  Take your time so you do not get dizzy or light-headed.  If you are in pain, you may need to take or ask for pain medication  before doing incentive spirometry. It is harder to take a deep breath if you are having pain. AFTER USE  Rest and breathe slowly and easily.  It can be helpful to keep track of a log of your progress. Your caregiver can provide you with a simple table to help with this. If you are using the spirometer at home, follow these instructions: [place] IF:   You are having difficultly using the spirometer.  You have trouble using the spirometer as often as instructed.  Your pain medication is not giving enough relief while using the spirometer.  You develop fever of 100.5 F (38.1 C) or higher. SEEK IMMEDIATE MEDICAL CARE IF:   You cough up bloody sputum that had not been present before.  You develop fever of 102 F (38.9 C) or greater.  You develop worsening pain at or near the incision site. MAKE SURE YOU:   Understand these instructions.  Will watch your condition.  Will get help right away if you are not doing well or get worse. Document Released: 07/18/2006 Document Revised: 05/30/2011 Document Reviewed: 09/18/2006 [facility] Patient Information 2014 [place], [place].   ________________________________________________________________________

## 2014-08-06 ENCOUNTER — Encounter (HOSPITAL_COMMUNITY): Payer: Self-pay

## 2014-08-06 ENCOUNTER — Encounter (HOSPITAL_COMMUNITY)
Admission: RE | Admit: 2014-08-06 | Discharge: 2014-08-06 | Disposition: A | Discharge status: Home / Self Care | Payer: Federal, State, Local not specified - PPO | Source: Ambulatory Visit | Attending: Orthopedic Surgery | Admitting: Orthopedic Surgery

## 2014-08-06 DIAGNOSIS — Z01818 Encounter for other preprocedural examination: Secondary | ICD-10-CM | POA: Insufficient documentation

## 2014-08-06 HISTORY — DX: Sleep apnea, unspecified: G47.30

## 2014-08-06 LAB — CBC
HCT: 44.1 % (ref 36.0–46.0)
HEMOGLOBIN: 13.7 g/dL (ref 12.0–15.0)
MCH: 26.8 pg (ref 26.0–34.0)
MCHC: 31.1 g/dL (ref 30.0–36.0)
MCV: 86.3 fL (ref 78.0–100.0)
Platelets: 201 10*3/uL (ref 150–400)
RBC: 5.11 MIL/uL (ref 3.87–5.11)
RDW: 14.6 % (ref 11.5–15.5)
WBC: 6.4 10*3/uL (ref 4.0–10.5)

## 2014-08-06 LAB — URINALYSIS, ROUTINE W REFLEX MICROSCOPIC
BILIRUBIN URINE: NEGATIVE
GLUCOSE, UA: NEGATIVE mg/dL
HGB URINE DIPSTICK: NEGATIVE
KETONES UR: NEGATIVE mg/dL
Nitrite: NEGATIVE
PH: 6 (ref 5.0–8.0)
Protein, ur: NEGATIVE mg/dL
Specific Gravity, Urine: 1.01 (ref 1.005–1.030)
Urobilinogen, UA: 0.2 mg/dL (ref 0.0–1.0)

## 2014-08-06 LAB — COMPREHENSIVE METABOLIC PANEL
ALK PHOS: 110 U/L (ref 38–126)
ALT: 25 U/L (ref 14–54)
AST: 30 U/L (ref 15–41)
Albumin: 4.2 g/dL (ref 3.5–5.0)
Anion gap: 12 (ref 5–15)
BILIRUBIN TOTAL: 0.8 mg/dL (ref 0.3–1.2)
BUN: 13 mg/dL (ref 6–20)
CHLORIDE: 101 mmol/L (ref 101–111)
CO2: 29 mmol/L (ref 22–32)
Calcium: 9.6 mg/dL (ref 8.9–10.3)
Creatinine, Ser: 0.97 mg/dL (ref 0.44–1.00)
GFR calc Af Amer: >60 mL/min (ref >60)
Glucose, Bld: 114 mg/dL — ABNORMAL HIGH (ref 65–99)
POTASSIUM: 3.6 mmol/L (ref 3.5–5.1)
SODIUM: 142 mmol/L (ref 135–145)
Total Protein: 7.5 g/dL (ref 6.5–8.1)

## 2014-08-06 LAB — PROTIME-INR
INR: 1 (ref 0.00–1.49)
Prothrombin Time: 13.3 seconds (ref 11.6–15.2)

## 2014-08-06 LAB — URINE MICROSCOPIC-ADD ON

## 2014-08-06 LAB — APTT: aPTT: 39 seconds — ABNORMAL HIGH (ref 24–37)

## 2014-08-06 LAB — SURGICAL PCR SCREEN
MRSA, PCR: NEGATIVE
STAPHYLOCOCCUS AUREUS: NEGATIVE

## 2014-08-06 NOTE — Progress Notes (Signed)
UA and micro done on 08-06-14 faxed via EPIC to Dr. Wynelle Link

## 2014-08-06 NOTE — Progress Notes (Signed)
Clearance Dr.Luking 07-02-14 on chart LOV 06-30-14 Dr. Wolfgang Phoenix on chart EKG 06-30-14 in [facility] 07-15-14 Stress Test EPIC 07-10-14 OV Dr. Johnsie Cancel EPIC

## 2014-08-07 MED ORDER — DEXAMETHASONE SODIUM PHOSPHATE 10 MG/ML IJ SOLN: 10.0000 mg | Freq: Once | INTRAMUSCULAR | Status: DC

## 2014-08-07 MED ORDER — ACETAMINOPHEN 10 MG/ML IV SOLN: 1000.0000 mg | Freq: Once | INTRAVENOUS | Status: DC

## 2014-08-07 MED ORDER — SODIUM CHLORIDE 0.9 % IV SOLN: INTRAVENOUS | Status: DC

## 2014-08-07 MED ORDER — CEFAZOLIN SODIUM-DEXTROSE 2-3 GM-% IV SOLR: 2.0000 g | INTRAVENOUS | Status: DC

## 2014-08-07 MED ORDER — CHLORHEXIDINE GLUCONATE 4 % EX LIQD: 60.0000 mL | Freq: Once | CUTANEOUS | Status: DC

## 2014-08-07 MED ORDER — BUPIVACAINE LIPOSOME 1.3 % IJ SUSP: 20.0000 mL | Freq: Once | INTRAMUSCULAR | Status: DC

## 2014-08-11 LAB — TYPE AND SCREEN
ABO/RH(D): O NEG
Antibody Screen: NEGATIVE

## 2014-08-30 ENCOUNTER — Other Ambulatory Visit: Payer: Self-pay | Admitting: Family Medicine

## 2014-09-03 ENCOUNTER — Other Ambulatory Visit: Payer: Self-pay

## 2014-09-03 MED ORDER — ZOLPIDEM TARTRATE 5 MG PO TABS: 5.0000 mg | ORAL_TABLET | Freq: Every evening | ORAL | Status: DC | PRN

## 2014-09-03 NOTE — Telephone Encounter (Signed)
Ok six mo 

## 2014-09-03 NOTE — Telephone Encounter (Signed)
Last seen 4.11.16.

## 2014-09-04 ENCOUNTER — Other Ambulatory Visit: Payer: Self-pay

## 2014-10-03 ENCOUNTER — Other Ambulatory Visit: Payer: Self-pay | Admitting: Obstetrics and Gynecology

## 2014-10-06 LAB — CYTOLOGY - PAP

## 2014-10-14 ENCOUNTER — Other Ambulatory Visit: Payer: Self-pay | Admitting: *Deleted

## 2014-10-14 MED ORDER — ROPINIROLE HCL 2 MG PO TABS: ORAL_TABLET | ORAL | Status: DC

## 2014-11-04 ENCOUNTER — Ambulatory Visit: Payer: Self-pay | Admitting: Orthopedic Surgery

## 2014-11-04 NOTE — Progress Notes (Signed)
Preoperative surgical orders have been place into the Epic hospital system for Karen Vazquez on 9/38/1017, 4:29 PM  by Mickel Crow for surgery on 11/17/2014.  Preop Total Knee orders including Experal, IV Tylenol, and IV Decadron as long as there are no contraindications to the above medications. Arlee Muslim, PA-C

## 2014-11-05 ENCOUNTER — Telehealth: Payer: Self-pay | Admitting: Family Medicine

## 2014-11-05 NOTE — Telephone Encounter (Signed)
Pt will be having knee replacement surgery in a couple of weeks and is currently taking a fluid pill. Pt states that after the surgery she will not be able to get up and go to the bathroom as much as she does now while taking the fluid pill.pt wants to know if there is another option. Please advise.

## 2014-11-05 NOTE — Telephone Encounter (Signed)
We could place her on a different antihypertensives if she is interested that is not a diuretic. Please talk with patient see what she would like to do

## 2014-11-06 ENCOUNTER — Other Ambulatory Visit: Payer: Self-pay | Admitting: Family Medicine

## 2014-11-06 MED ORDER — AMLODIPINE BESYLATE 5 MG PO TABS: 5.0000 mg | ORAL_TABLET | Freq: Every day | ORAL | Status: DC

## 2014-11-06 NOTE — Telephone Encounter (Signed)
Rx sent electronically to pharmacy. Patient notified and scheduled nurse visit for blood pressure check next week.

## 2014-11-06 NOTE — Telephone Encounter (Signed)
Amlodipine 5 mg 1 daily, #30, 5 refills. Patient should consider doing a nurse visit next week to check blood pressure, this can be done next week preferably on a day I am present, discontinue HCTZ from medicine list

## 2014-11-06 NOTE — Telephone Encounter (Signed)
Patient states that she would like to try a antihypertensive medication without the diuretic. Please send to [place].

## 2014-11-07 NOTE — Patient Instructions (Addendum)
Karen Vazquez  [birth year]   Your procedure is scheduled on:  November 17, 2014  Report to [facility] Main  Entrance take [place]  elevators to 3rd floor to  [place] at  11:50 AM.  Call this number if you have problems the morning of surgery [phone]   Remember: ONLY 1 PERSON MAY GO WITH YOU TO SHORT STAY TO GET  READY MORNING OF [place].  Do not eat food or drink liquids :After Midnight.              Bring mask and tubing from c-pap machine morning of surgery     Take these medicines the morning of surgery with A SIP OF WATER: Amlodipine (Norvasc), Protonix, Armour Thyroid                               You may not have any metal on your body including hair pins and              piercings  Do not wear jewelry, make-up, lotions, powders or perfumes, deodorant             Do not wear nail polish.  Do not shave  48 hours prior to surgery.         Do not bring valuables to the hospital. [place].  Contacts, dentures or bridgework may not be worn into surgery.  Leave suitcase in the car. After surgery it may be brought to your room.     Special Instructions: coughing and deep breathing exercises, leg exercises              Please read over the following fact sheets you were given: _____________________________________________________________________             [facility] - Preparing for Surgery Before surgery, you can play an important role.  Because skin is not sterile, your skin needs to be as free of germs as possible.  You can reduce the number of germs on your skin by washing with CHG (chlorahexidine gluconate) soap before surgery.  CHG is an antiseptic cleaner which kills germs and bonds with the skin to continue killing germs even after washing. Please DO NOT use if you have an allergy to CHG or antibacterial soaps.  If your skin becomes reddened/irritated stop using the CHG and inform  your nurse when you arrive at Short Stay. Do not shave (including legs and underarms) for at least 48 hours prior to the first CHG shower.  You may shave your face/neck. Please follow these instructions carefully:  1.  Shower with CHG Soap the night before surgery and the  morning of Surgery.  2.  If you choose to wash your hair, wash your hair first as usual with your  normal  shampoo.  3.  After you shampoo, rinse your hair and body thoroughly to remove the  shampoo.                           4.  Use CHG as you would any other liquid soap.  You can apply chg directly  to the skin and wash  Gently with a scrungie or clean washcloth.  5.  Apply the CHG Soap to your body ONLY FROM THE NECK DOWN.   Do not use on face/ open                           Wound or open sores. Avoid contact with eyes, ears mouth and genitals (private parts).                       Wash face,  Genitals (private parts) with your normal soap.             6.  Wash thoroughly, paying special attention to the area where your surgery  will be performed.  7.  Thoroughly rinse your body with warm water from the neck down.  8.  DO NOT shower/wash with your normal soap after using and rinsing off  the CHG Soap.                9.  Pat yourself dry with a clean towel.            10.  Wear clean pajamas.            11.  Place clean sheets on your bed the night of your first shower and do not  sleep with pets. Day of Surgery : Do not apply any lotions/deodorants the morning of surgery.  Please wear clean clothes to the hospital/surgery center.  FAILURE TO FOLLOW THESE INSTRUCTIONS MAY RESULT IN THE CANCELLATION OF YOUR SURGERY PATIENT SIGNATURE_________________________________  NURSE SIGNATURE__________________________________  ________________________________________________________________________  WHAT IS A BLOOD TRANSFUSION? Blood Transfusion Information  A transfusion is the replacement of blood or some  of its parts. Blood is made up of multiple cells which provide different functions.  Red blood cells carry oxygen and are used for blood loss replacement.  White blood cells fight against infection.  Platelets control bleeding.  Plasma helps clot blood.  Other blood products are available for specialized needs, such as hemophilia or other clotting disorders. BEFORE THE TRANSFUSION  Who gives blood for transfusions?   Healthy volunteers who are fully evaluated to make sure their blood is safe. This is blood bank blood. Transfusion therapy is the safest it has ever been in the practice of medicine. Before blood is taken from a donor, a complete history is taken to make sure that person has no history of diseases nor engages in risky social behavior (examples are intravenous drug use or sexual activity with multiple partners). The donor's travel history is screened to minimize risk of transmitting infections, such as malaria. The donated blood is tested for signs of infectious diseases, such as HIV and hepatitis. The blood is then tested to be sure it is compatible with you in order to minimize the chance of a transfusion reaction. If you or a relative donates blood, this is often done in anticipation of surgery and is not appropriate for emergency situations. It takes many days to process the donated blood. RISKS AND COMPLICATIONS Although transfusion therapy is very safe and saves many lives, the main dangers of transfusion include:  1. Getting an infectious disease. 2. Developing a transfusion reaction. This is an allergic reaction to something in the blood you were given. Every precaution is taken to prevent this. The decision to have a blood transfusion has been considered carefully by your caregiver before blood is given. Blood is not given unless the benefits outweigh  the risks. AFTER THE TRANSFUSION  Right after receiving a blood transfusion, you will usually feel much better and more  energetic. This is especially true if your red blood cells have gotten low (anemic). The transfusion raises the level of the red blood cells which carry oxygen, and this usually causes an energy increase.  The nurse administering the transfusion will monitor you carefully for complications. HOME CARE INSTRUCTIONS  No special instructions are needed after a transfusion. You may find your energy is better. Speak with your caregiver about any limitations on activity for underlying diseases you may have. SEEK MEDICAL CARE IF:   Your condition is not improving after your transfusion.  You develop redness or irritation at the intravenous (IV) site. SEEK IMMEDIATE MEDICAL CARE IF:  Any of the following symptoms occur over the next 12 hours:  Shaking chills.  You have a temperature by mouth above 102 F (38.9 C), not controlled by medicine.  Chest, back, or muscle pain.  People around you feel you are not acting correctly or are confused.  Shortness of breath or difficulty breathing.  Dizziness and fainting.  You get a rash or develop hives.  You have a decrease in urine output.  Your urine turns a dark color or changes to pink, red, or brown. Any of the following symptoms occur over the next 10 days:  You have a temperature by mouth above 102 F (38.9 C), not controlled by medicine.  Shortness of breath.  Weakness after normal activity.  The white part of the eye turns yellow (jaundice).  You have a decrease in the amount of urine or are urinating less often.  Your urine turns a dark color or changes to pink, red, or brown. Document Released: 03/04/2000 Document Revised: 05/30/2011 Document Reviewed: 10/22/2007 ExitCare Patient Information 2014 [place].  _______________________________________________________________________  Incentive Spirometer  An incentive spirometer is a tool that can help keep your lungs clear and active. This tool measures how well you are  filling your lungs with each breath. Taking long deep breaths may help reverse or decrease the chance of developing breathing (pulmonary) problems (especially infection) following:  A long period of time when you are unable to move or be active. BEFORE THE PROCEDURE   If the spirometer includes an indicator to show your best effort, your nurse or respiratory therapist will set it to a desired goal.  If possible, sit up straight or lean slightly forward. Try not to slouch.  Hold the incentive spirometer in an upright position. INSTRUCTIONS FOR USE  3. Sit on the edge of your bed if possible, or sit up as far as you can in bed or on a chair. 4. Hold the incentive spirometer in an upright position. 5. Breathe out normally. 6. Place the mouthpiece in your mouth and seal your lips tightly around it. 7. Breathe in slowly and as deeply as possible, raising the piston or the ball toward the top of the column. 8. Hold your breath for 3-5 seconds or for as long as possible. Allow the piston or ball to fall to the bottom of the column. 9. Remove the mouthpiece from your mouth and breathe out normally. 10. Rest for a few seconds and repeat Steps 1 through 7 at least 10 times every 1-2 hours when you are awake. Take your time and take a few normal breaths between deep breaths. 11. The spirometer may include an indicator to show your best effort. Use the indicator as a goal to work  toward during each repetition. 12. After each set of 10 deep breaths, practice coughing to be sure your lungs are clear. If you have an incision (the cut made at the time of surgery), support your incision when coughing by placing a pillow or rolled up towels firmly against it. Once you are able to get out of bed, walk around indoors and cough well. You may stop using the incentive spirometer when instructed by your caregiver.  RISKS AND COMPLICATIONS  Take your time so you do not get dizzy or light-headed.  If you are in  pain, you may need to take or ask for pain medication before doing incentive spirometry. It is harder to take a deep breath if you are having pain. AFTER USE  Rest and breathe slowly and easily.  It can be helpful to keep track of a log of your progress. Your caregiver can provide you with a simple table to help with this. If you are using the spirometer at home, follow these instructions: [place] IF:   You are having difficultly using the spirometer.  You have trouble using the spirometer as often as instructed.  Your pain medication is not giving enough relief while using the spirometer.  You develop fever of 100.5 F (38.1 C) or higher. SEEK IMMEDIATE MEDICAL CARE IF:   You cough up bloody sputum that had not been present before.  You develop fever of 102 F (38.9 C) or greater.  You develop worsening pain at or near the incision site. MAKE SURE YOU:   Understand these instructions.  Will watch your condition.  Will get help right away if you are not doing well or get worse. Document Released: 07/18/2006 Document Revised: 05/30/2011 Document Reviewed: 09/18/2006 [facility] Patient Information 2014 [place], [place].   ________________________________________________________________________

## 2014-11-10 ENCOUNTER — Encounter (HOSPITAL_COMMUNITY)
Admission: RE | Admit: 2014-11-10 | Discharge: 2014-11-10 | Disposition: A | Discharge status: Home / Self Care | Payer: Federal, State, Local not specified - PPO | Source: Ambulatory Visit | Attending: Orthopedic Surgery | Admitting: Orthopedic Surgery

## 2014-11-10 ENCOUNTER — Encounter (HOSPITAL_COMMUNITY): Payer: Self-pay

## 2014-11-10 DIAGNOSIS — K219 Gastro-esophageal reflux disease without esophagitis: Secondary | ICD-10-CM | POA: Insufficient documentation

## 2014-11-10 DIAGNOSIS — I10 Essential (primary) hypertension: Secondary | ICD-10-CM | POA: Diagnosis not present

## 2014-11-10 DIAGNOSIS — G2581 Restless legs syndrome: Secondary | ICD-10-CM | POA: Diagnosis not present

## 2014-11-10 DIAGNOSIS — G4733 Obstructive sleep apnea (adult) (pediatric): Secondary | ICD-10-CM | POA: Diagnosis not present

## 2014-11-10 DIAGNOSIS — Z01812 Encounter for preprocedural laboratory examination: Secondary | ICD-10-CM | POA: Insufficient documentation

## 2014-11-10 DIAGNOSIS — E785 Hyperlipidemia, unspecified: Secondary | ICD-10-CM | POA: Insufficient documentation

## 2014-11-10 DIAGNOSIS — D509 Iron deficiency anemia, unspecified: Secondary | ICD-10-CM | POA: Diagnosis not present

## 2014-11-10 DIAGNOSIS — G4700 Insomnia, unspecified: Secondary | ICD-10-CM | POA: Insufficient documentation

## 2014-11-10 DIAGNOSIS — E039 Hypothyroidism, unspecified: Secondary | ICD-10-CM | POA: Diagnosis not present

## 2014-11-10 LAB — CBC
HCT: 40.5 % (ref 36.0–46.0)
HEMOGLOBIN: 13 g/dL (ref 12.0–15.0)
MCH: 28.1 pg (ref 26.0–34.0)
MCHC: 32.1 g/dL (ref 30.0–36.0)
MCV: 87.7 fL (ref 78.0–100.0)
PLATELETS: 161 10*3/uL (ref 150–400)
RBC: 4.62 MIL/uL (ref 3.87–5.11)
RDW: 13.9 % (ref 11.5–15.5)
WBC: 5.1 10*3/uL (ref 4.0–10.5)

## 2014-11-10 LAB — COMPREHENSIVE METABOLIC PANEL
ALBUMIN: 3.9 g/dL (ref 3.5–5.0)
ALK PHOS: 101 U/L (ref 38–126)
ALT: 15 U/L (ref 14–54)
AST: 19 U/L (ref 15–41)
Anion gap: 5 (ref 5–15)
BUN: 12 mg/dL (ref 6–20)
CALCIUM: 9.4 mg/dL (ref 8.9–10.3)
CHLORIDE: 107 mmol/L (ref 101–111)
CO2: 29 mmol/L (ref 22–32)
CREATININE: 0.88 mg/dL (ref 0.44–1.00)
GFR calc Af Amer: >60 mL/min (ref >60)
GFR calc non Af Amer: >60 mL/min (ref >60)
GLUCOSE: 86 mg/dL (ref 65–99)
Potassium: 4.6 mmol/L (ref 3.5–5.1)
SODIUM: 141 mmol/L (ref 135–145)
Total Bilirubin: 0.8 mg/dL (ref 0.3–1.2)
Total Protein: 6.6 g/dL (ref 6.5–8.1)

## 2014-11-10 LAB — URINALYSIS, ROUTINE W REFLEX MICROSCOPIC
Bilirubin Urine: NEGATIVE
GLUCOSE, UA: NEGATIVE mg/dL
Hgb urine dipstick: NEGATIVE
KETONES UR: NEGATIVE mg/dL
LEUKOCYTES UA: NEGATIVE
Nitrite: NEGATIVE
PROTEIN: NEGATIVE mg/dL
Specific Gravity, Urine: 1.012 (ref 1.005–1.030)
UROBILINOGEN UA: 0.2 mg/dL (ref 0.0–1.0)
pH: 5.5 (ref 5.0–8.0)

## 2014-11-10 LAB — SURGICAL PCR SCREEN
MRSA, PCR: NEGATIVE
Staphylococcus aureus: NEGATIVE

## 2014-11-10 LAB — PROTIME-INR
INR: 1.1 (ref 0.00–1.49)
Prothrombin Time: 14.4 seconds (ref 11.6–15.2)

## 2014-11-10 LAB — APTT: APTT: 38 s — AB (ref 24–37)

## 2014-11-10 NOTE — Progress Notes (Signed)
11-10-14 - Faxed PTT lab results from preop visit on 11-10-14 to Dr. Juanda Bond via [facility]

## 2014-11-10 NOTE — Progress Notes (Signed)
07-15-14 - Stress Test - EPIC 07-10-14 - LOV - Dr. Liliane Channel - EPIC 06-30-14 - EKG - EPIC 06-30-14 - LOV - Dr. Wolfgang Phoenix (fam.med.) & surgical clearance in chart & EPIC

## 2014-11-11 ENCOUNTER — Ambulatory Visit: Payer: Federal, State, Local not specified - PPO

## 2014-11-11 VITALS — BP 132/78 | Ht 64.0 in

## 2014-11-11 DIAGNOSIS — I10 Essential (primary) hypertension: Secondary | ICD-10-CM

## 2014-11-11 NOTE — Progress Notes (Signed)
   Subjective:    Patient ID: Karen Vazquez, female    DOB: Aug 27, 1955, 59 y.o.   MRN: [id number]  HPI Patient in today for a nurse visit to check BP per Doctor. Patient blood pressure was 132/78. Discussed with Dr.Scott and he stated patient to follow up in 3 months.    Review of Systems     Objective:   Physical Exam        Assessment & Plan:

## 2014-11-11 NOTE — H&P (Signed)
TOTAL KNEE ADMISSION H&P  Patient is being admitted for left total knee arthroplasty.  Subjective:  Chief Complaint:left knee pain.  HPI: Karen Vazquez, 59 y.o. female, has a history of pain and functional disability in the left knee due to arthritis and has failed non-surgical conservative treatments for greater than 12 weeks to includeNSAID's and/or analgesics, corticosteriod injections, viscosupplementation injections and activity modification.  Onset of symptoms was gradual, starting 5 years ago with gradually worsening course since that time. The patient noted no past surgery on the left knee(s).  Patient currently rates pain in the left knee(s) at 8 out of 10 with activity. Patient has night pain, worsening of pain with activity and weight bearing, pain that interferes with activities of daily living, pain with passive range of motion, crepitus and joint swelling.  Patient has evidence of periarticular osteophytes and joint space narrowing by imaging studies.  There is no active infection.  Patient Active Problem List   Diagnosis Date Noted  . HTN (hypertension) 05/28/2014  . Obstructive sleep apnea 05/28/2014  . Anemia, iron deficiency 12/16/2013  . Prediabetes 05/01/2013  . GERD (gastroesophageal reflux disease) 02/06/2013  . Hypothyroidism 12/12/2012  . Insomnia 12/12/2012  . Hyperlipidemia 12/12/2012  . Osteoarthritis 12/12/2012  . Restless legs syndrome 12/12/2012   Past Medical History  Diagnosis Date  . Hypothyroidism   . Blood transfusion 2011    at [facility]  . Arthritis   . Restless leg syndrome   . Hypertension   . Sleep apnea     does not use c-pap machine    Past Surgical History  Procedure Laterality Date  . Joint replacement  2011    rt knee  . Tubal ligation    . Appendectomy    . Tonsillectomy    . Vaginal hysterectomy  12/29/2010    Procedure: HYSTERECTOMY VAGINAL;  Surgeon: Daria Pastures;  Location: [facility];  Service: Gynecology;  Laterality: N/A;   . Bladder suspension  12/29/2010    Procedure: TRANSVAGINAL TAPE (TVT) PROCEDURE;  Surgeon: Daria Pastures;  Location: [facility];  Service: Gynecology;  Laterality: N/A;  . Cystocele repair  12/29/2010    Procedure: ANTERIOR REPAIR (CYSTOCELE);  Surgeon: Daria Pastures;  Location: [facility];  Service: Gynecology;  Laterality: N/A;  . Cystoscopy  12/29/2010    Procedure: CYSTOSCOPY;  Surgeon: Daria Pastures;  Location: [facility];  Service: Gynecology;  Laterality: N/A;  . Replacement total knee Right 2011  . Breast surgery      left breast lumpectomy 1977      Current outpatient prescriptions:  .  pantoprazole (PROTONIX) 40 MG tablet, Take 40 mg by mouth daily as needed (acid reflux). Rarely takes, Disp: , Rfl:  .  potassium chloride SA (K-DUR,KLOR-CON) 20 MEQ tablet, Take 1 tablet (20 mEq total) by mouth daily. (Patient taking differently: Take 20 mEq by mouth at bedtime. ), Disp: 30 tablet, Rfl: 5 .  amLODipine (NORVASC) 5 MG tablet, Take 1 tablet (5 mg total) by mouth daily., Disp: 30 tablet, Rfl: 5 .  ARMOUR THYROID 15 MG tablet, TAKE ONE TABLET ONCE DAILY, Disp: 30 tablet, Rfl: 0 .  ARMOUR THYROID 30 MG tablet, TAKE ONE TABLET ONCE DAILY, Disp: 30 tablet, Rfl: 0 .  rOPINIRole (REQUIP) 2 MG tablet, TAKE 1 TABLET EVERY        EVENING. NEW DOSE. PLEASE  MAKE AN APPOINTMENT WITH   YOUR DOCTOR., Disp: 90 tablet, Rfl: 0 .  zolpidem (AMBIEN) 5 MG tablet, Take  1 tablet (5 mg total) by mouth at bedtime as needed for sleep., Disp: 90 tablet, Rfl: 1  Allergies  Allergen Reactions  . Lisinopril Cough    Social History  Substance Use Topics  . Smoking status: Never Smoker   . Smokeless tobacco: Never Used  . Alcohol Use: No    Family History  Problem Relation Age of Onset  . Stroke Mother   . Hypertension Mother   . Hyperlipidemia Mother   . Heart disease Father   . COPD Father   . Cancer Paternal Grandmother     breast  . Diabetes Paternal Grandfather      Review of Systems   Constitutional: Negative.   HENT: Negative.   Eyes: Negative.   Respiratory: Positive for shortness of breath. Negative for cough, hemoptysis, sputum production and wheezing.        SOB with exertion  Cardiovascular: Negative.   Gastrointestinal: Negative.   Genitourinary: Negative.   Musculoskeletal: Positive for joint pain. Negative for myalgias, back pain, falls and neck pain.       Left knee pain  Skin: Negative.   Neurological: Negative.   Endo/Heme/Allergies: Negative.   Psychiatric/Behavioral: Negative.     Objective:  Physical Exam  Constitutional: She is oriented to person, place, and time. She appears well-developed and well-nourished. No distress.  HENT:  Head: Normocephalic and atraumatic.  Right Ear: External ear normal.  Left Ear: External ear normal.  Nose: Nose normal.  Mouth/Throat: Oropharynx is clear and moist.  Eyes: Conjunctivae and EOM are normal.  Neck: Normal range of motion. Neck supple.  Cardiovascular: Normal rate, regular rhythm, normal heart sounds and intact distal pulses.   No murmur heard. Respiratory: Effort normal and breath sounds normal. No respiratory distress. She has no wheezes.  GI: Soft. She exhibits no distension. There is no tenderness.  Musculoskeletal:       Right hip: Normal.       Left hip: Normal.       Right knee: Normal.       Left knee: She exhibits decreased range of motion and swelling. She exhibits no effusion and no erythema. Tenderness found. Medial joint line and lateral joint line tenderness noted.  The left knee shows no effusion. There is moderate crepitus on range of motion of the knee. Range is about 5-125. She is tender medial greater than lateral. No instability is noted.  Neurological: She is alert and oriented to person, place, and time. She has normal strength and normal reflexes. No sensory deficit.  Skin: No rash noted. She is not diaphoretic. No erythema.  Psychiatric: She has a normal mood and affect. Her  behavior is normal.    Vital signs in last 24 hours: Temp:  [98.2 F (36.8 C)] 98.2 F (36.8 C) (08/22 1038) Pulse Rate:  [75] 75 (08/22 1038) Resp:  [18] 18 (08/22 1038) BP: (157)/(76) 157/76 mmHg (08/22 1038) SpO2:  [99 %] 99 % (08/22 1038) Weight:  [107.049 kg (236 lb)] 107.049 kg (236 lb) (08/22 1038)  Labs:   Estimated body mass index is 39.12 kg/(m^2) as calculated from the following:   Height as of 12/16/13: 5' 4.5" (1.638 m).   Weight as of 12/16/13: 104.962 kg (231 lb 6.4 oz).   Imaging Review Plain radiographs demonstrate severe degenerative joint disease of the left knee(s). The overall alignment ismild varus. The bone quality appears to be good for age and reported activity level.  Assessment/Plan:  End stage primary osteoarthritis, left knee  The patient history, physical examination, clinical judgment of the provider and imaging studies are consistent with end stage degenerative joint disease of the left knee(s) and total knee arthroplasty is deemed medically necessary. The treatment options including medical management, injection therapy arthroscopy and arthroplasty were discussed at length. The risks and benefits of total knee arthroplasty were presented and reviewed. The risks due to aseptic loosening, infection, stiffness, patella tracking problems, thromboembolic complications and other imponderables were discussed. The patient acknowledged the explanation, agreed to proceed with the plan and consent was signed. Patient is being admitted for inpatient treatment for surgery, pain control, PT, OT, prophylactic antibiotics, VTE prophylaxis, progressive ambulation and ADL's and discharge planning. The patient is planning to be discharged home with home health services    Prefers [facility] TXA IV PCP: Dr. Fonnie Mu, PA-C

## 2014-11-16 MED ORDER — ACETAMINOPHEN 10 MG/ML IV SOLN
1000.0000 mg | Freq: Once | INTRAVENOUS | Status: DC
  Filled 2014-11-16: qty 100

## 2014-11-16 MED ORDER — DEXAMETHASONE SODIUM PHOSPHATE 10 MG/ML IJ SOLN: 10.0000 mg | Freq: Once | INTRAMUSCULAR | Status: DC

## 2014-11-16 MED ORDER — CEFAZOLIN SODIUM-DEXTROSE 2-3 GM-% IV SOLR: 2.0000 g | INTRAVENOUS | Status: DC

## 2014-11-16 MED ORDER — TRANEXAMIC ACID 1000 MG/10ML IV SOLN
1000.0000 mg | INTRAVENOUS | Status: DC
  Filled 2014-11-16: qty 10

## 2014-11-16 MED ORDER — BUPIVACAINE LIPOSOME 1.3 % IJ SUSP
20.0000 mL | Freq: Once | INTRAMUSCULAR | Status: DC
  Filled 2014-11-16: qty 20

## 2014-11-16 MED ORDER — SODIUM CHLORIDE 0.9 % IV SOLN: INTRAVENOUS | Status: DC

## 2014-11-16 MED ORDER — CHLORHEXIDINE GLUCONATE 4 % EX LIQD: 60.0000 mL | Freq: Once | CUTANEOUS | Status: DC

## 2014-11-17 ENCOUNTER — Encounter (HOSPITAL_COMMUNITY)
Admission: RE | Disposition: A | Discharge status: Home Health | Payer: Self-pay | Source: Ambulatory Visit | Attending: Orthopedic Surgery

## 2014-11-17 ENCOUNTER — Inpatient Hospital Stay (HOSPITAL_COMMUNITY): Payer: Federal, State, Local not specified - PPO | Admitting: Certified Registered Nurse Anesthetist

## 2014-11-17 ENCOUNTER — Inpatient Hospital Stay (HOSPITAL_COMMUNITY)
Admission: RE | Admit: 2014-11-17 | Discharge: 2014-11-19 | DRG: 470 | Disposition: A | Discharge status: Home Health | Payer: Federal, State, Local not specified - PPO | Source: Ambulatory Visit | Attending: Orthopedic Surgery | Admitting: Orthopedic Surgery

## 2014-11-17 ENCOUNTER — Encounter (HOSPITAL_COMMUNITY): Payer: Self-pay | Admitting: *Deleted

## 2014-11-17 DIAGNOSIS — G2581 Restless legs syndrome: Secondary | ICD-10-CM | POA: Diagnosis present

## 2014-11-17 DIAGNOSIS — E785 Hyperlipidemia, unspecified: Secondary | ICD-10-CM | POA: Diagnosis present

## 2014-11-17 DIAGNOSIS — Z01812 Encounter for preprocedural laboratory examination: Secondary | ICD-10-CM | POA: Diagnosis not present

## 2014-11-17 DIAGNOSIS — G4733 Obstructive sleep apnea (adult) (pediatric): Secondary | ICD-10-CM | POA: Diagnosis present

## 2014-11-17 DIAGNOSIS — Z79899 Other long term (current) drug therapy: Secondary | ICD-10-CM

## 2014-11-17 DIAGNOSIS — K219 Gastro-esophageal reflux disease without esophagitis: Secondary | ICD-10-CM | POA: Diagnosis present

## 2014-11-17 DIAGNOSIS — M1712 Unilateral primary osteoarthritis, left knee: Secondary | ICD-10-CM | POA: Diagnosis present

## 2014-11-17 DIAGNOSIS — Z96651 Presence of right artificial knee joint: Secondary | ICD-10-CM | POA: Diagnosis present

## 2014-11-17 DIAGNOSIS — E039 Hypothyroidism, unspecified: Secondary | ICD-10-CM | POA: Diagnosis present

## 2014-11-17 DIAGNOSIS — I10 Essential (primary) hypertension: Secondary | ICD-10-CM | POA: Diagnosis present

## 2014-11-17 DIAGNOSIS — Z6841 Body Mass Index (BMI) 40.0 and over, adult: Secondary | ICD-10-CM

## 2014-11-17 DIAGNOSIS — M25562 Pain in left knee: Secondary | ICD-10-CM | POA: Diagnosis present

## 2014-11-17 DIAGNOSIS — M179 Osteoarthritis of knee, unspecified: Secondary | ICD-10-CM | POA: Diagnosis present

## 2014-11-17 DIAGNOSIS — M1710 Unilateral primary osteoarthritis, unspecified knee: Secondary | ICD-10-CM | POA: Diagnosis present

## 2014-11-17 HISTORY — PX: TOTAL KNEE ARTHROPLASTY: SHX125

## 2014-11-17 LAB — TYPE AND SCREEN
ABO/RH(D): O NEG
Antibody Screen: NEGATIVE

## 2014-11-17 LAB — GLUCOSE, CAPILLARY: GLUCOSE-CAPILLARY: 175 mg/dL — AB (ref 65–99)

## 2014-11-17 SURGERY — ARTHROPLASTY, KNEE, TOTAL
Anesthesia: Spinal | Site: Knee | Laterality: Left

## 2014-11-17 MED ORDER — OXYCODONE HCL 5 MG PO TABS
5.0000 mg | ORAL_TABLET | ORAL | Status: DC | PRN
  Administered 2014-11-17: 5 mg via ORAL
  Administered 2014-11-18: 10 mg via ORAL
  Administered 2014-11-18: 5 mg via ORAL
  Administered 2014-11-18 – 2014-11-19 (×5): 10 mg via ORAL
  Filled 2014-11-17 (×4): qty 2
  Filled 2014-11-17: qty 1
  Filled 2014-11-17: qty 2
  Filled 2014-11-17: qty 1
  Filled 2014-11-17: qty 2

## 2014-11-17 MED ORDER — KETOROLAC TROMETHAMINE 15 MG/ML IJ SOLN: 7.5000 mg | Freq: Four times a day (QID) | INTRAMUSCULAR | Status: AC | PRN

## 2014-11-17 MED ORDER — DEXAMETHASONE SODIUM PHOSPHATE 4 MG/ML IJ SOLN
INTRAMUSCULAR | Status: DC | PRN
  Administered 2014-11-17: 10 mg via INTRAVENOUS

## 2014-11-17 MED ORDER — CHLORHEXIDINE GLUCONATE 4 % EX LIQD: 60.0000 mL | Freq: Once | CUTANEOUS | Status: DC

## 2014-11-17 MED ORDER — CEFAZOLIN SODIUM-DEXTROSE 2-3 GM-% IV SOLR
2.0000 g | Freq: Four times a day (QID) | INTRAVENOUS | Status: AC
  Administered 2014-11-17 – 2014-11-18 (×2): 2 g via INTRAVENOUS
  Filled 2014-11-17 (×2): qty 50

## 2014-11-17 MED ORDER — ACETAMINOPHEN 500 MG PO TABS
1000.0000 mg | ORAL_TABLET | Freq: Four times a day (QID) | ORAL | Status: AC
  Administered 2014-11-17 – 2014-11-18 (×4): 1000 mg via ORAL
  Filled 2014-11-17 (×5): qty 2

## 2014-11-17 MED ORDER — PANTOPRAZOLE SODIUM 40 MG PO TBEC: 40.0000 mg | DELAYED_RELEASE_TABLET | Freq: Every day | ORAL | Status: DC | PRN

## 2014-11-17 MED ORDER — ROPINIROLE HCL 1 MG PO TABS
2.0000 mg | ORAL_TABLET | Freq: Every evening | ORAL | Status: DC
  Administered 2014-11-17 – 2014-11-18 (×2): 2 mg via ORAL
  Filled 2014-11-17 (×3): qty 2

## 2014-11-17 MED ORDER — BUPIVACAINE HCL (PF) 0.25 % IJ SOLN
INTRAMUSCULAR | Status: AC
  Filled 2014-11-17: qty 30

## 2014-11-17 MED ORDER — MIDAZOLAM HCL 5 MG/5ML IJ SOLN
INTRAMUSCULAR | Status: DC | PRN
  Administered 2014-11-17 (×2): 1 mg via INTRAVENOUS

## 2014-11-17 MED ORDER — TRAMADOL HCL 50 MG PO TABS
50.0000 mg | ORAL_TABLET | Freq: Four times a day (QID) | ORAL | Status: DC | PRN
  Administered 2014-11-19: 100 mg via ORAL
  Filled 2014-11-17: qty 2

## 2014-11-17 MED ORDER — SODIUM CHLORIDE 0.9 % IR SOLN
Status: DC | PRN
  Administered 2014-11-17: 1000 mL

## 2014-11-17 MED ORDER — LACTATED RINGERS IV SOLN
INTRAVENOUS | Status: DC | PRN
  Administered 2014-11-17 (×2): via INTRAVENOUS

## 2014-11-17 MED ORDER — BISACODYL 10 MG RE SUPP: 10.0000 mg | Freq: Every day | RECTAL | Status: DC | PRN

## 2014-11-17 MED ORDER — THYROID 30 MG PO TABS
30.0000 mg | ORAL_TABLET | Freq: Every day | ORAL | Status: DC
  Administered 2014-11-18 – 2014-11-19 (×2): 30 mg via ORAL
  Filled 2014-11-17 (×2): qty 1

## 2014-11-17 MED ORDER — POLYETHYLENE GLYCOL 3350 17 G PO PACK: 17.0000 g | PACK | Freq: Every day | ORAL | Status: DC | PRN

## 2014-11-17 MED ORDER — PROPOFOL 10 MG/ML IV BOLUS
INTRAVENOUS | Status: AC
  Filled 2014-11-17: qty 20

## 2014-11-17 MED ORDER — LACTATED RINGERS IV SOLN
INTRAVENOUS | Status: DC
  Administered 2014-11-17: 14:00:00 via INTRAVENOUS

## 2014-11-17 MED ORDER — MORPHINE SULFATE (PF) 2 MG/ML IV SOLN
1.0000 mg | INTRAVENOUS | Status: DC | PRN
  Administered 2014-11-17: 2 mg via INTRAVENOUS
  Filled 2014-11-17: qty 1

## 2014-11-17 MED ORDER — SODIUM CHLORIDE 0.9 % IJ SOLN
INTRAMUSCULAR | Status: DC | PRN
  Administered 2014-11-17: 30 mL

## 2014-11-17 MED ORDER — ZOLPIDEM TARTRATE 5 MG PO TABS: 5.0000 mg | ORAL_TABLET | Freq: Every evening | ORAL | Status: DC | PRN

## 2014-11-17 MED ORDER — MIDAZOLAM HCL 2 MG/2ML IJ SOLN
INTRAMUSCULAR | Status: AC
  Filled 2014-11-17: qty 4

## 2014-11-17 MED ORDER — SODIUM CHLORIDE 0.9 % IJ SOLN
INTRAMUSCULAR | Status: AC
  Filled 2014-11-17: qty 50

## 2014-11-17 MED ORDER — THYROID 30 MG PO TABS
15.0000 mg | ORAL_TABLET | Freq: Every day | ORAL | Status: DC
  Administered 2014-11-18 – 2014-11-19 (×2): 15 mg via ORAL
  Filled 2014-11-17 (×2): qty 1

## 2014-11-17 MED ORDER — ONDANSETRON HCL 4 MG/2ML IJ SOLN
INTRAMUSCULAR | Status: DC | PRN
  Administered 2014-11-17: 4 mg via INTRAVENOUS

## 2014-11-17 MED ORDER — FLEET ENEMA 7-19 GM/118ML RE ENEM: 1.0000 | ENEMA | Freq: Once | RECTAL | Status: DC | PRN

## 2014-11-17 MED ORDER — METOCLOPRAMIDE HCL 10 MG PO TABS: 5.0000 mg | ORAL_TABLET | Freq: Three times a day (TID) | ORAL | Status: DC | PRN

## 2014-11-17 MED ORDER — ONDANSETRON HCL 4 MG PO TABS
4.0000 mg | ORAL_TABLET | Freq: Four times a day (QID) | ORAL | Status: DC | PRN
  Administered 2014-11-18: 4 mg via ORAL
  Filled 2014-11-17: qty 1

## 2014-11-17 MED ORDER — ACETAMINOPHEN 650 MG RE SUPP: 650.0000 mg | Freq: Four times a day (QID) | RECTAL | Status: DC | PRN

## 2014-11-17 MED ORDER — BUPIVACAINE LIPOSOME 1.3 % IJ SUSP
20.0000 mL | Freq: Once | INTRAMUSCULAR | Status: DC
  Filled 2014-11-17: qty 20

## 2014-11-17 MED ORDER — ACETAMINOPHEN 10 MG/ML IV SOLN
INTRAVENOUS | Status: AC
  Filled 2014-11-17: qty 100

## 2014-11-17 MED ORDER — LIDOCAINE HCL (CARDIAC) 20 MG/ML IV SOLN
INTRAVENOUS | Status: DC | PRN
  Administered 2014-11-17: 75 mg via INTRAVENOUS

## 2014-11-17 MED ORDER — DOCUSATE SODIUM 100 MG PO CAPS
100.0000 mg | ORAL_CAPSULE | Freq: Two times a day (BID) | ORAL | Status: DC
  Administered 2014-11-17 – 2014-11-19 (×4): 100 mg via ORAL

## 2014-11-17 MED ORDER — HYDROMORPHONE HCL 1 MG/ML IJ SOLN
0.2500 mg | INTRAMUSCULAR | Status: DC | PRN
  Administered 2014-11-17 (×2): 0.5 mg via INTRAVENOUS

## 2014-11-17 MED ORDER — SODIUM CHLORIDE 0.9 % IV SOLN: INTRAVENOUS | Status: DC

## 2014-11-17 MED ORDER — DIPHENHYDRAMINE HCL 12.5 MG/5ML PO ELIX: 12.5000 mg | ORAL_SOLUTION | ORAL | Status: DC | PRN

## 2014-11-17 MED ORDER — ONDANSETRON HCL 4 MG/2ML IJ SOLN: 4.0000 mg | Freq: Four times a day (QID) | INTRAMUSCULAR | Status: DC | PRN

## 2014-11-17 MED ORDER — TRANEXAMIC ACID 1000 MG/10ML IV SOLN
1000.0000 mg | INTRAVENOUS | Status: AC
  Administered 2014-11-17: 1000 mg via INTRAVENOUS
  Filled 2014-11-17: qty 10

## 2014-11-17 MED ORDER — DEXAMETHASONE SODIUM PHOSPHATE 10 MG/ML IJ SOLN
10.0000 mg | Freq: Once | INTRAMUSCULAR | Status: AC
  Administered 2014-11-18: 10 mg via INTRAVENOUS
  Filled 2014-11-17: qty 1

## 2014-11-17 MED ORDER — BUPIVACAINE IN DEXTROSE 0.75-8.25 % IT SOLN
INTRATHECAL | Status: DC | PRN
  Administered 2014-11-17: 2 mL via INTRATHECAL

## 2014-11-17 MED ORDER — FENTANYL CITRATE (PF) 100 MCG/2ML IJ SOLN
INTRAMUSCULAR | Status: AC
  Filled 2014-11-17: qty 4

## 2014-11-17 MED ORDER — POTASSIUM CHLORIDE CRYS ER 20 MEQ PO TBCR
20.0000 meq | EXTENDED_RELEASE_TABLET | Freq: Every day | ORAL | Status: DC
  Filled 2014-11-17 (×2): qty 1

## 2014-11-17 MED ORDER — RIVAROXABAN 10 MG PO TABS
10.0000 mg | ORAL_TABLET | Freq: Every day | ORAL | Status: DC
  Administered 2014-11-18 – 2014-11-19 (×2): 10 mg via ORAL
  Filled 2014-11-17 (×3): qty 1

## 2014-11-17 MED ORDER — CEFAZOLIN SODIUM-DEXTROSE 2-3 GM-% IV SOLR
2.0000 g | INTRAVENOUS | Status: AC
  Administered 2014-11-17: 2 g via INTRAVENOUS

## 2014-11-17 MED ORDER — HYDROMORPHONE HCL 1 MG/ML IJ SOLN
INTRAMUSCULAR | Status: AC
Start: 2014-11-17 — End: 2014-11-18
  Filled 2014-11-17: qty 1

## 2014-11-17 MED ORDER — DEXAMETHASONE SODIUM PHOSPHATE 10 MG/ML IJ SOLN: 10.0000 mg | Freq: Once | INTRAMUSCULAR | Status: DC

## 2014-11-17 MED ORDER — ACETAMINOPHEN 10 MG/ML IV SOLN
1000.0000 mg | Freq: Once | INTRAVENOUS | Status: DC
  Filled 2014-11-17: qty 100

## 2014-11-17 MED ORDER — ACETAMINOPHEN 10 MG/ML IV SOLN
INTRAVENOUS | Status: DC | PRN
  Administered 2014-11-17: 1000 mg via INTRAVENOUS

## 2014-11-17 MED ORDER — DEXTROSE-NACL 5-0.9 % IV SOLN
INTRAVENOUS | Status: DC
  Administered 2014-11-18: 01:00:00 via INTRAVENOUS

## 2014-11-17 MED ORDER — FENTANYL CITRATE (PF) 100 MCG/2ML IJ SOLN
INTRAMUSCULAR | Status: DC | PRN
  Administered 2014-11-17 (×2): 50 ug via INTRAVENOUS

## 2014-11-17 MED ORDER — CEFAZOLIN SODIUM-DEXTROSE 2-3 GM-% IV SOLR
INTRAVENOUS | Status: AC
  Filled 2014-11-17: qty 50

## 2014-11-17 MED ORDER — PROMETHAZINE HCL 25 MG/ML IJ SOLN: 6.2500 mg | INTRAMUSCULAR | Status: DC | PRN

## 2014-11-17 MED ORDER — AMLODIPINE BESYLATE 5 MG PO TABS
5.0000 mg | ORAL_TABLET | Freq: Every day | ORAL | Status: DC
  Administered 2014-11-18 – 2014-11-19 (×2): 5 mg via ORAL
  Filled 2014-11-17 (×2): qty 1

## 2014-11-17 MED ORDER — BUPIVACAINE HCL 0.25 % IJ SOLN
INTRAMUSCULAR | Status: DC | PRN
  Administered 2014-11-17: 20 mL

## 2014-11-17 MED ORDER — BUPIVACAINE LIPOSOME 1.3 % IJ SUSP
INTRAMUSCULAR | Status: DC | PRN
  Administered 2014-11-17: 20 mL

## 2014-11-17 MED ORDER — MENTHOL 3 MG MT LOZG: 1.0000 | LOZENGE | OROMUCOSAL | Status: DC | PRN

## 2014-11-17 MED ORDER — PROPOFOL INFUSION 10 MG/ML OPTIME
INTRAVENOUS | Status: DC | PRN
  Administered 2014-11-17: 120 ug/kg/min via INTRAVENOUS

## 2014-11-17 MED ORDER — METHOCARBAMOL 500 MG PO TABS: 500.0000 mg | ORAL_TABLET | Freq: Four times a day (QID) | ORAL | Status: DC | PRN

## 2014-11-17 MED ORDER — METOCLOPRAMIDE HCL 5 MG/ML IJ SOLN: 5.0000 mg | Freq: Three times a day (TID) | INTRAMUSCULAR | Status: DC | PRN

## 2014-11-17 MED ORDER — ACETAMINOPHEN 325 MG PO TABS: 650.0000 mg | ORAL_TABLET | Freq: Four times a day (QID) | ORAL | Status: DC | PRN

## 2014-11-17 MED ORDER — PHENOL 1.4 % MT LIQD: 1.0000 | OROMUCOSAL | Status: DC | PRN

## 2014-11-17 MED ORDER — METHOCARBAMOL 1000 MG/10ML IJ SOLN
500.0000 mg | Freq: Four times a day (QID) | INTRAVENOUS | Status: DC | PRN
  Administered 2014-11-17: 500 mg via INTRAVENOUS
  Filled 2014-11-17 (×2): qty 5

## 2014-11-17 SURGICAL SUPPLY — 65 items
BAG DECANTER FOR FLEXI CONT (MISCELLANEOUS) ×2 IMPLANT
BAG SPEC THK2 15X12 ZIP CLS (MISCELLANEOUS) ×1
BAG ZIPLOCK 12X15 (MISCELLANEOUS) ×2 IMPLANT
BANDAGE ELASTIC 6 VELCRO ST LF (GAUZE/BANDAGES/DRESSINGS) ×2 IMPLANT
BANDAGE ESMARK 6X9 LF (GAUZE/BANDAGES/DRESSINGS) ×1 IMPLANT
BLADE SAG 18X100X1.27 (BLADE) ×2 IMPLANT
BLADE SAW SGTL 11.0X1.19X90.0M (BLADE) ×2 IMPLANT
BNDG CMPR 9X6 STRL LF SNTH (GAUZE/BANDAGES/DRESSINGS) ×1
BNDG ESMARK 6X9 LF (GAUZE/BANDAGES/DRESSINGS) ×2
BOWL SMART MIX CTS (DISPOSABLE) ×2 IMPLANT
CAPT KNEE TOTAL 3 ATTUNE ×1 IMPLANT
CEMENT HV SMART SET (Cement) ×4 IMPLANT
CUFF TOURN SGL QUICK 34 (TOURNIQUET CUFF) ×2
CUFF TRNQT CYL 34X4X40X1 (TOURNIQUET CUFF) ×1 IMPLANT
DECANTER SPIKE VIAL GLASS SM (MISCELLANEOUS) ×2 IMPLANT
DRAPE EXTREMITY T 121X128X90 (DRAPE) ×2 IMPLANT
DRAPE POUCH INSTRU U-SHP 10X18 (DRAPES) ×2 IMPLANT
DRAPE U-SHAPE 47X51 STRL (DRAPES) ×2 IMPLANT
DRSG ADAPTIC 3X8 NADH LF (GAUZE/BANDAGES/DRESSINGS) ×2 IMPLANT
DRSG PAD ABDOMINAL 8X10 ST (GAUZE/BANDAGES/DRESSINGS) ×1 IMPLANT
DURAPREP 26ML APPLICATOR (WOUND CARE) ×2 IMPLANT
ELECT REM PT RETURN 9FT ADLT (ELECTROSURGICAL) ×2
ELECTRODE REM PT RTRN 9FT ADLT (ELECTROSURGICAL) ×1 IMPLANT
EVACUATOR 1/8 PVC DRAIN (DRAIN) ×2 IMPLANT
FACESHIELD WRAPAROUND (MASK) ×10 IMPLANT
FACESHIELD WRAPAROUND OR TEAM (MASK) ×5 IMPLANT
GAUZE SPONGE 4X4 12PLY STRL (GAUZE/BANDAGES/DRESSINGS) ×2 IMPLANT
GLOVE BIO SURGEON STRL SZ7.5 (GLOVE) ×1 IMPLANT
GLOVE BIO SURGEON STRL SZ8 (GLOVE) ×2 IMPLANT
GLOVE BIOGEL PI IND STRL 6.5 (GLOVE) IMPLANT
GLOVE BIOGEL PI IND STRL 8 (GLOVE) ×1 IMPLANT
GLOVE BIOGEL PI INDICATOR 6.5 (GLOVE)
GLOVE BIOGEL PI INDICATOR 8 (GLOVE) ×1
GLOVE SURG SS PI 6.5 STRL IVOR (GLOVE) IMPLANT
GOWN STRL REUS W/TWL LRG LVL3 (GOWN DISPOSABLE) ×2 IMPLANT
GOWN STRL REUS W/TWL XL LVL3 (GOWN DISPOSABLE) ×1 IMPLANT
HANDPIECE INTERPULSE COAX TIP (DISPOSABLE) ×2
IMMOBILIZER KNEE 20 (SOFTGOODS) ×1 IMPLANT
IMMOBILIZER KNEE 20 THIGH 36 (SOFTGOODS) ×1 IMPLANT
KIT BASIN OR (CUSTOM PROCEDURE TRAY) ×2 IMPLANT
MANIFOLD NEPTUNE II (INSTRUMENTS) ×2 IMPLANT
NDL SAFETY ECLIPSE 18X1.5 (NEEDLE) ×2 IMPLANT
NEEDLE HYPO 18GX1.5 SHARP (NEEDLE) ×4
NS IRRIG 1000ML POUR BTL (IV SOLUTION) ×2 IMPLANT
PACK TOTAL JOINT (CUSTOM PROCEDURE TRAY) ×2 IMPLANT
PAD ABD 8X10 STRL (GAUZE/BANDAGES/DRESSINGS) ×1 IMPLANT
PADDING CAST COTTON 6X4 STRL (CAST SUPPLIES) ×4 IMPLANT
PEN SKIN MARKING BROAD (MISCELLANEOUS) ×2 IMPLANT
POSITIONER SURGICAL ARM (MISCELLANEOUS) ×2 IMPLANT
SET HNDPC FAN SPRY TIP SCT (DISPOSABLE) ×1 IMPLANT
STRIP CLOSURE SKIN 1/2X4 (GAUZE/BANDAGES/DRESSINGS) ×3 IMPLANT
SUCTION FRAZIER 12FR DISP (SUCTIONS) ×2 IMPLANT
SUT MNCRL AB 4-0 PS2 18 (SUTURE) ×2 IMPLANT
SUT VIC AB 2-0 CT1 27 (SUTURE) ×6
SUT VIC AB 2-0 CT1 TAPERPNT 27 (SUTURE) ×3 IMPLANT
SUT VLOC 180 0 24IN GS25 (SUTURE) ×1 IMPLANT
SYR 20CC LL (SYRINGE) ×2 IMPLANT
SYR 50ML LL SCALE MARK (SYRINGE) ×2 IMPLANT
TOWEL OR 17X26 10 PK STRL BLUE (TOWEL DISPOSABLE) ×2 IMPLANT
TOWEL OR NON WOVEN STRL DISP B (DISPOSABLE) ×1 IMPLANT
TRAY FOLEY W/METER SILVER 14FR (SET/KITS/TRAYS/PACK) ×2 IMPLANT
TRAY FOLEY W/METER SILVER 16FR (SET/KITS/TRAYS/PACK) ×1 IMPLANT
WATER STERILE IRR 1500ML POUR (IV SOLUTION) ×2 IMPLANT
WRAP KNEE MAXI GEL POST OP (GAUZE/BANDAGES/DRESSINGS) ×2 IMPLANT
YANKAUER SUCT BULB TIP 10FT TU (MISCELLANEOUS) ×2 IMPLANT

## 2014-11-17 NOTE — Anesthesia Postprocedure Evaluation (Signed)
  Anesthesia Post-op Note  Patient: Karen Vazquez  Procedure(s) Performed: Procedure(s) (LRB): LEFT TOTAL KNEE ARTHROPLASTY (Left)  Patient Location: PACU  Anesthesia Type: Spinal  Level of Consciousness: awake and alert   Airway and Oxygen Therapy: Patient Spontanous Breathing  Post-op Pain: mild  Post-op Assessment: Post-op Vital signs reviewed, Patient's Cardiovascular Status Stable, Respiratory Function Stable, Patent Airway and No signs of Nausea or vomiting. Spinal is receding.  Last Vitals:  Filed Vitals:   11/17/14 1832  BP: 128/72  Pulse: 81  Temp: 36.4 C  Resp: 19    Post-op Vital Signs: stable   Complications: No apparent anesthesia complications

## 2014-11-17 NOTE — Interval H&P Note (Signed)
History and Physical Interval Note:  09/07/5091 26:71 AM  Karen Vazquez  has presented today for surgery, with the diagnosis of LEFT KNEE OA  The various methods of treatment have been discussed with the patient and family. After consideration of risks, benefits and other options for treatment, the patient has consented to  Procedure(s): LEFT TOTAL KNEE ARTHROPLASTY (Left) as a surgical intervention .  The patient's history has been reviewed, patient examined, no change in status, stable for surgery.  I have reviewed the patient's chart and labs.  Questions were answered to the patient's satisfaction.     Gearlean Alf

## 2014-11-17 NOTE — Anesthesia Procedure Notes (Signed)
Spinal  Start time: 11/17/2014 3:00 PM End time: 11/17/2014 3:10 PM Staffing Anesthesiologist: Franne Grip Performed by: anesthesiologist  Preanesthetic Checklist Completed: patient identified, site marked, surgical consent, pre-op evaluation, timeout performed, IV checked, risks and benefits discussed and monitors and equipment checked Spinal Block Patient position: sitting Prep: Betadine, site prepped and draped and alcohol swabs Patient monitoring: continuous pulse ox, blood pressure and heart rate Approach: midline Location: L3-4 Needle Needle type: Sprotte  Needle gauge: 24 G Needle length: 10 cm Additional Notes Kit expiration date checked.  Two attempts by CRNA.    Negative heme, paresthesia, tolerated well.

## 2014-11-17 NOTE — Anesthesia Preprocedure Evaluation (Addendum)
Anesthesia Evaluation  Patient identified by MRN, date of birth, ID band Patient awake    Reviewed: Allergy & Precautions, NPO status , Patient's Chart, lab work & pertinent test results  Airway Mallampati: II  TM Distance: >3 FB Neck ROM: Full    Dental no notable dental hx.    Pulmonary sleep apnea ,  breath sounds clear to auscultation  Pulmonary exam normal       Cardiovascular Exercise Tolerance: Good hypertension, Pt. on medications Normal cardiovascular examRhythm:Regular Rate:Normal     Neuro/Psych negative neurological ROS  negative psych ROS   GI/Hepatic Neg liver ROS, GERD-  Medicated,  Endo/Other  Hypothyroidism Morbid obesity  Renal/GU negative Renal ROS  negative genitourinary   Musculoskeletal  (+) Arthritis -,   Abdominal   Peds negative pediatric ROS (+)  Hematology  (+) anemia ,   Anesthesia Other Findings   Reproductive/Obstetrics negative OB ROS                           Anesthesia Physical Anesthesia Plan  ASA: III  Anesthesia Plan: Spinal   Post-op Pain Management:    Induction: Intravenous  Airway Management Planned:   Additional Equipment:   Intra-op Plan:   Post-operative Plan: Extubation in OR  Informed Consent: I have reviewed the patients History and Physical, chart, labs and discussed the procedure including the risks, benefits and alternatives for the proposed anesthesia with the patient or authorized representative who has indicated his/her understanding and acceptance.   Dental advisory given  Plan Discussed with: CRNA  Anesthesia Plan Comments: (Discussed risks and benefits of and differences between spinal and general. Discussed risks of spinal including headache, backache, failure, bleeding and hematoma, infection, and nerve damage and paralysis. Patient consents to spinal. Questions answered. Coagulation studies and platelet count  acceptable. )       Anesthesia Quick Evaluation

## 2014-11-17 NOTE — Op Note (Signed)
Pre-operative diagnosis- Osteoarthritis  Left knee(s)  Post-operative diagnosis- Osteoarthritis Left knee(s)  Procedure-  Left  Total Knee Arthroplasty  Surgeon- Dione Plover. Aldeen Riga, MD  Assistant- Arlee Muslim, PA-C   Anesthesia-  Spinal  EBL-* No blood loss amount entered *   Drains Hemovac  Tourniquet time-  Total Tourniquet Time Documented: Thigh (Left) - 35 minutes Total: Thigh (Left) - 35 minutes     Complications- None  Condition-PACU - hemodynamically stable.   Brief Clinical Note  Karen Vazquez is a 59 y.o. year old female with end stage OA of her left knee with progressively worsening pain and dysfunction. She has constant pain, with activity and at rest and significant functional deficits with difficulties even with ADLs. She has had extensive non-op management including analgesics, injections of cortisone and viscosupplements, and home exercise program, but remains in significant pain with significant dysfunction. Radiographs show bone on bone arthritis medial and patellofemoral. She presents now for left Total Knee Arthroplasty.    Procedure in detail---   The patient is brought into the operating room and positioned supine on the operating table. After successful administration of  Spinal,   a tourniquet is placed high on the  Left thigh(s) and the lower extremity is prepped and draped in the usual sterile fashion. Time out is performed by the operating team and then the  Left lower extremity is wrapped in Esmarch, knee flexed and the tourniquet inflated to 300 mmHg.       A midline incision is made with a ten blade through the subcutaneous tissue to the level of the extensor mechanism. A fresh blade is used to make a medial parapatellar arthrotomy. Soft tissue over the proximal medial tibia is subperiosteally elevated to the joint line with a knife and into the semimembranosus bursa with a Cobb elevator. Soft tissue over the proximal lateral tibia is elevated with  attention being paid to avoiding the patellar tendon on the tibial tubercle. The patella is everted, knee flexed 90 degrees and the ACL and PCL are removed. Findings are bone on bone medial and patellofemoral with large global osteophytes.      The drill is used to create a starting hole in the distal femur and the canal is thoroughly irrigated with sterile saline to remove the fatty contents. The 5 degree Left  valgus alignment guide is placed into the femoral canal and the distal femoral cutting block is pinned to remove 10 mm off the distal femur. Resection is made with an oscillating saw.      The tibia is subluxed forward and the menisci are removed. The extramedullary alignment guide is placed referencing proximally at the medial aspect of the tibial tubercle and distally along the second metatarsal axis and tibial crest. The block is pinned to remove 57mm off the more deficient medial  side. Resection is made with an oscillating saw. Size 5is the most appropriate size for the tibia and the proximal tibia is prepared with the modular drill and keel punch for that size.      The femoral sizing guide is placed and size 6 is most appropriate. Rotation is marked off the epicondylar axis and confirmed by creating a rectangular flexion gap at 90 degrees. The size 6 cutting block is pinned in this rotation and the anterior, posterior and chamfer cuts are made with the oscillating saw. The intercondylar block is then placed and that cut is made.      Trial size 5 tibial component, trial size  6 posterior stabilized femur and a 7  mm posterior stabilized rotating platform insert trial is placed. Full extension is achieved with excellent varus/valgus and anterior/posterior balance throughout full range of motion. The patella is everted and thickness measured to be 22  mm. Free hand resection is taken to 12 mm, a 35 template is placed, lug holes are drilled, trial patella is placed, and it tracks normally. Osteophytes  are removed off the posterior femur with the trial in place. All trials are removed and the cut bone surfaces prepared with pulsatile lavage. Cement is mixed and once ready for implantation, the size 5 tibial implant, size  6 narrow posterior stabilized femoral component, and the size 35 patella are cemented in place and the patella is held with the clamp. The trial insert is placed and the knee held in full extension. The Exparel (20 ml mixed with 30 ml saline) and .25% Bupivicaine, are injected into the extensor mechanism, posterior capsule, medial and lateral gutters and subcutaneous tissues.  All extruded cement is removed and once the cement is hard the permanent 7 mm posterior stabilized rotating platform insert is placed into the tibial tray.      The wound is copiously irrigated with saline solution and the extensor mechanism closed over a hemovac drain with #1 V-loc suture. The tourniquet is released for a total tourniquet time of 36  minutes. Flexion against gravity is 140 degrees and the patella tracks normally. Subcutaneous tissue is closed with 2.0 vicryl and subcuticular with running 4.0 Monocryl. The incision is cleaned and dried and steri-strips and a bulky sterile dressing are applied. The limb is placed into a knee immobilizer and the patient is awakened and transported to recovery in stable condition.      Please note that a surgical assistant was a medical necessity for this procedure in order to perform it in a safe and expeditious manner. Surgical assistant was necessary to retract the ligaments and vital neurovascular structures to prevent injury to them and also necessary for proper positioning of the limb to allow for anatomic placement of the prosthesis.   Dione Plover Kariel Skillman, MD    11/17/2014, 4:25 PM

## 2014-11-17 NOTE — Transfer of Care (Signed)
Immediate Anesthesia Transfer of Care Note  Patient: Karen Vazquez  Procedure(s) Performed: Procedure(s): LEFT TOTAL KNEE ARTHROPLASTY (Left)  Patient Location: PACU  Anesthesia Type:Spinal  Level of Consciousness: awake, alert , oriented and patient cooperative  Airway & Oxygen Therapy: Patient Spontanous Breathing and Patient connected to face mask oxygen  Post-op Assessment: Report given to RN and Post -op Vital signs reviewed and stable  Post vital signs: Reviewed and stable  Last Vitals:  Filed Vitals:   11/17/14 1121  BP: 138/65  Pulse: 84  Temp: 36.4 C  Resp: 16    Complications: No apparent anesthesia complications

## 2014-11-17 NOTE — Progress Notes (Signed)
Pt refused nocturnal CPAP.  Pt states that she does not use it at home and wishes to sleep without it tonight.  RN aware.  Pt has home tubing and mask should she decide that she wishes to use CPAP.  Pt will call should she decide she needs CPAP.  RT will continue to monitor as needed.

## 2014-11-18 ENCOUNTER — Encounter (HOSPITAL_COMMUNITY): Payer: Self-pay | Admitting: Orthopedic Surgery

## 2014-11-18 LAB — BASIC METABOLIC PANEL
ANION GAP: 5 (ref 5–15)
BUN: 11 mg/dL (ref 6–20)
CALCIUM: 9 mg/dL (ref 8.9–10.3)
CO2: 29 mmol/L (ref 22–32)
CREATININE: 0.83 mg/dL (ref 0.44–1.00)
Chloride: 104 mmol/L (ref 101–111)
GFR calc non Af Amer: >60 mL/min (ref >60)
Glucose, Bld: 161 mg/dL — ABNORMAL HIGH (ref 65–99)
Potassium: 5 mmol/L (ref 3.5–5.1)
SODIUM: 138 mmol/L (ref 135–145)

## 2014-11-18 LAB — CBC
HEMATOCRIT: 38 % (ref 36.0–46.0)
HEMOGLOBIN: 12.3 g/dL (ref 12.0–15.0)
MCH: 28 pg (ref 26.0–34.0)
MCHC: 32.4 g/dL (ref 30.0–36.0)
MCV: 86.4 fL (ref 78.0–100.0)
Platelets: 173 10*3/uL (ref 150–400)
RBC: 4.4 MIL/uL (ref 3.87–5.11)
RDW: 13.6 % (ref 11.5–15.5)
WBC: 11.2 10*3/uL — AB (ref 4.0–10.5)

## 2014-11-18 MED ORDER — TRAMADOL HCL 50 MG PO TABS: 50.0000 mg | ORAL_TABLET | Freq: Four times a day (QID) | ORAL | Status: DC | PRN

## 2014-11-18 MED ORDER — OXYCODONE HCL 5 MG PO TABS: 5.0000 mg | ORAL_TABLET | ORAL | Status: DC | PRN

## 2014-11-18 MED ORDER — METHOCARBAMOL 500 MG PO TABS: 500.0000 mg | ORAL_TABLET | Freq: Four times a day (QID) | ORAL | Status: DC | PRN

## 2014-11-18 MED ORDER — RIVAROXABAN 10 MG PO TABS: 10.0000 mg | ORAL_TABLET | Freq: Every day | ORAL | Status: DC

## 2014-11-18 NOTE — Care Management Note (Signed)
Case Management Note  Patient Details  Name: Karen Vazquez MRN: [id number] Date of Birth: May 25, 1955  Subjective/Objective:                   LEFT TOTAL KNEE ARTHROPLASTY (Left) Action/Plan:  Discharge planning Expected Discharge Date:  11/19/14               Expected Discharge Plan:  [place]  In-House Referral:     Discharge planning Services  CM Consult  Post Acute Care Choice:  Home Health Choice offered to:  Patient  DME Arranged:  Walker rolling DME Agency:  [place]:  PT [facility] Agency:  [place]  Status of Service:  Completed, signed off  Medicare Important Message Given:    Date Medicare IM Given:    Medicare IM give by:    Date Additional Medicare IM Given:    Additional Medicare Important Message give by:     If discussed at [place] of Stay Meetings, dates discussed:    Additional Comments: CM met with pt in room to offer choice of home health agency.  Pt chooses Tressia Danas of [facility] to render HHPT.  Address and contact information verified by pt.  Referral called to [facility] rep, Kristen.  CM called [facility] DME rep to please deliver the rolling walker to room prior to discharge.  No other CM needs were communicated. Dellie Catholic, RN 11/18/2014, 3:43 PM

## 2014-11-18 NOTE — Progress Notes (Signed)
Pt refused nocturnal CPAP again tonight.  RT will continue to monitor as needed.   

## 2014-11-18 NOTE — Evaluation (Signed)
Physical Therapy Evaluation Patient Details Name: Karen Vazquez MRN: [id number] DOB: 11/23/1955 Today's Date: 11/18/2014   History of Present Illness  s/p L TKA  Clinical Impression  Pt admitted with above diagnosis. Pt currently with functional limitations due to the deficits listed below (see PT Problem List).  Pt will benefit from skilled PT to increase their independence and safety with mobility to allow discharge to the venue listed below.   Pt very pleasant and motivated, should progress well, will need HHPT vs OPPT post acute     Follow Up Recommendations Home health PT    Equipment Recommendations  Rolling walker with 5" wheels    Recommendations for Other Services       Precautions / Restrictions Precautions Precautions: Knee;Fall Required Braces or Orthoses: Knee Immobilizer - Left Knee Immobilizer - Left: Discontinue once straight leg raise with < 10 degree lag Restrictions Other Position/Activity Restrictions: WBAT      Mobility  Bed Mobility Overal bed mobility: Needs Assistance Bed Mobility: Supine to Sit     Supine to sit: Min assist     General bed mobility comments: assist with LLE  Transfers Overall transfer level: Needs assistance Equipment used: Rolling walker (2 wheeled) Transfers: Sit to/from Stand Sit to Stand: Min guard         General transfer comment: cues for hand placement and LLE management  Ambulation/Gait Ambulation/Gait assistance: Min guard Ambulation Distance (Feet): 55 Feet Assistive device: Rolling walker (2 wheeled) Gait Pattern/deviations: Step-to pattern;Antalgic     General Gait Details: cues for RW position from self, sequence  Stairs            Wheelchair Mobility    Modified Rankin (Stroke Patients Only)       Balance                                             Pertinent Vitals/Pain Pain Assessment: 0-10 Pain Score: 3  Pain Location: L knee Pain Descriptors / Indicators:  Aching;Sore Pain Intervention(s): Limited activity within patient's tolerance;Monitored during session;Premedicated before session;Ice applied;Repositioned    Home Living Family/patient expects to be discharged to:: Private residence Living Arrangements: Spouse/significant other Available Help at Discharge: Family Type of Home: House Home Access: Stairs to enter   Technical brewer of Steps: 1 Home Layout: One level Home Equipment: Bedside commode;Shower seat      Prior Function Level of Independence: Independent               Hand Dominance        Extremity/Trunk Assessment   Upper Extremity Assessment: Defer to OT evaluation           Lower Extremity Assessment: LLE deficits/detail   LLE Deficits / Details: knee extension, hip flexion 2 to 2+/5;  ankle WFL     Communication   Communication: No difficulties  Cognition Arousal/Alertness: Awake/alert Behavior During Therapy: WFL for tasks assessed/performed Overall Cognitive Status: Within Functional Limits for tasks assessed                      General Comments      Exercises Total Joint Exercises Ankle Circles/Pumps: AROM;Both;10 reps Quad Sets: 10 reps;Left;Strengthening;AROM Straight Leg Raises: AAROM;10 reps;Left      Assessment/Plan    PT Assessment Patient needs continued PT services  PT Diagnosis Difficulty walking   PT Problem List  Decreased strength;Decreased range of motion;Decreased mobility;Decreased activity tolerance  PT Treatment Interventions DME instruction;Gait training;Functional mobility training;Therapeutic activities;Patient/family education;Therapeutic exercise   PT Goals (Current goals can be found in the Care Plan section) Acute Rehab PT Goals Patient Stated Goal: return to I PT Goal Formulation: With patient Time For Goal Achievement: 11/21/14 Potential to Achieve Goals: Good    Frequency 7X/week   Barriers to discharge        Co-evaluation                End of Session Equipment Utilized During Treatment: Gait belt;Left knee immobilizer Activity Tolerance: Patient tolerated treatment well Patient left: in chair;with call bell/phone within reach;with family/visitor present Nurse Communication: Mobility status         Time: 1308-6578 PT Time Calculation (min) (ACUTE ONLY): 31 min   Charges:   PT Evaluation $Initial PT Evaluation Tier I: 1 Procedure PT Treatments $Gait Training: 8-22 mins   PT G Codes:        Nechemia Chiappetta December 09, 2014, 10:10 AM

## 2014-11-18 NOTE — Progress Notes (Signed)
   11/18/14 1400  PT Visit Information  Last PT Received On 11/18/14  Assistance Needed +1  History of Present Illness s/p L TKA  PT Time Calculation  PT Start Time (ACUTE ONLY) 1357  PT Stop Time (ACUTE ONLY) 1418  PT Time Calculation (min) (ACUTE ONLY) 21 min  Subjective Data  Patient Stated Goal return to I  Precautions  Precautions Knee;Fall  Required Braces or Orthoses Knee Immobilizer - Left  Knee Immobilizer - Left Discontinue once straight leg raise with < 10 degree lag  Restrictions  Other Position/Activity Restrictions WBAT  Pain Assessment  Pain Assessment 0-10  Pain Score 2  Pain Location L knee  Pain Descriptors / Indicators Discomfort  Pain Intervention(s) Limited activity within patient's tolerance;Monitored during session;Premedicated before session;Ice applied  Cognition  Arousal/Alertness Awake/alert  Behavior During Therapy WFL for tasks assessed/performed  Overall Cognitive Status Within Functional Limits for tasks assessed  Bed Mobility  General bed mobility comments oob  Transfers  Overall transfer level Needs assistance  Equipment used Rolling walker (2 wheeled)  Transfers Sit to/from Stand  Sit to Stand Supervision  General transfer comment cues for hand placement and LLE management  Ambulation/Gait  Ambulation/Gait assistance Min guard  Ambulation Distance (Feet) 120 Feet  Assistive device Rolling walker (2 wheeled)  Gait Pattern/deviations Step-to pattern;Antalgic;Decreased stance time - left  General Gait Details cues for RW position from self, sequence, to push RW  Total Joint Exercises  Ankle Circles/Pumps AROM;Both;10 reps  Quad Sets 10 reps;Left;Strengthening;AROM  Straight Leg Raises AAROM;10 reps;Left  Heel Slides AROM;AAROM;Left;10 reps  Hip ABduction/ADduction AAROM;AROM;Left;10 reps  PT - End of Session  Equipment Utilized During Treatment Gait belt;Left knee immobilizer  Activity Tolerance Patient tolerated treatment well  Patient  left in chair;with call bell/phone within reach;with family/visitor present  Nurse Communication Mobility status  PT - Assessment/Plan  PT Plan Current plan remains appropriate  PT Frequency (ACUTE ONLY) 7X/week  Follow Up Recommendations Home health PT  PT equipment Rolling walker with 5" wheels  PT Goal Progression  Progress towards PT goals Progressing toward goals  Acute Rehab PT Goals  PT Goal Formulation With patient  Time For Goal Achievement 11/21/14  Potential to Achieve Goals Good  PT General Charges  $$ ACUTE PT VISIT 1 Procedure  PT Treatments  $Therapeutic Exercise 8-22 mins

## 2014-11-18 NOTE — Progress Notes (Signed)
   Subjective: 1 Day Post-Op Procedure(s) (LRB): LEFT TOTAL KNEE ARTHROPLASTY (Left) Patient reports pain as mild.   Patient seen in rounds with Dr. Wynelle Link.  Husband in room at bedside. Patient is well, but has had some minor complaints of pain in the knee, requiring pain medications We will start therapy today.  Plan is to go Home after hospital stay.  Objective: Vital signs in last 24 hours: Temp:  [97.6 F (36.4 C)-98.1 F (36.7 C)] 98.1 F (36.7 C) (08/30 0622) Pulse Rate:  [67-85] 70 (08/30 0622) Resp:  [10-19] 16 (08/30 0622) BP: (90-138)/(52-95) 128/52 mmHg (08/30 0622) SpO2:  [96 %-100 %] 97 % (08/30 0622) Weight:  [107.049 kg (236 lb)] 107.049 kg (236 lb) (08/29 1143)  Intake/Output from previous day:  Intake/Output Summary (Last 24 hours) at 11/18/14 0901 Last data filed at 11/18/14 0836  Gross per 24 hour  Intake   2585 ml  Output    885 ml  Net   1700 ml    Intake/Output this shift: Total I/O In: 240 [P.O.:240] Out: -   Labs:  Recent Labs  11/18/14 0704  HGB 12.3    Recent Labs  11/18/14 0704  WBC 11.2*  RBC 4.40  HCT 38.0  PLT 173    Recent Labs  11/18/14 0704  NA 138  K 5.0  CL 104  CO2 29  BUN 11  CREATININE 0.83  GLUCOSE 161*  CALCIUM 9.0   No results for input(s): LABPT, INR in the last 72 hours.  EXAM General - Patient is Alert, Appropriate and Oriented Extremity - Neurovascular intact Sensation intact distally Dorsiflexion/Plantar flexion intact Dressing - dressing C/D/I Motor Function - intact, moving foot and toes well on exam.  Hemovac pulled without difficulty.  Past Medical History  Diagnosis Date  . Hypothyroidism   . Blood transfusion 2011    at [facility]  . Arthritis   . Restless leg syndrome   . Hypertension   . Sleep apnea     does not use c-pap machine    Assessment/Plan: 1 Day Post-Op Procedure(s) (LRB): LEFT TOTAL KNEE ARTHROPLASTY (Left) Principal Problem:   OA (osteoarthritis) of knee  Estimated  body mass index is 40.49 kg/(m^2) as calculated from the following:   Height as of this encounter: 5\' 4"  (1.626 m).   Weight as of this encounter: 107.049 kg (236 lb). Advance diet Up with therapy Plan for discharge tomorrow Discharge home with home health  DVT Prophylaxis - Xarelto Weight-Bearing as tolerated to left leg D/C O2 and Pulse OX and try on Room Air  Arlee Muslim, PA-C Orthopaedic Surgery 11/18/2014, 9:01 AM

## 2014-11-18 NOTE — Discharge Instructions (Addendum)
° °Dr. Frank Aluisio °Total Joint Specialist °[facility] °[address] °[place], [place] [address] °[phone] ° °TOTAL KNEE REPLACEMENT POSTOPERATIVE DIRECTIONS ° °Knee Rehabilitation, Guidelines Following Surgery  °Results after knee surgery are often greatly improved when you follow the exercise, range of motion and muscle strengthening exercises prescribed by your doctor. Safety measures are also important to protect the knee from further injury. Any time any of these exercises cause you to have increased pain or swelling in your knee joint, decrease the amount until you are comfortable again and slowly increase them. If you have problems or questions, call your caregiver or physical therapist for advice.  ° °HOME CARE INSTRUCTIONS  °Remove items at home which could result in a fall. This includes throw rugs or furniture in walking pathways.  °· ICE to the affected knee every three hours for 30 minutes at a time and then as needed for pain and swelling.  Continue to use ice on the knee for pain and swelling from surgery. You may notice swelling that will progress down to the foot and ankle.  This is normal after surgery.  Elevate the leg when you are not up walking on it.   °· Continue to use the breathing machine which will help keep your temperature down.  It is common for your temperature to cycle up and down following surgery, especially at night when you are not up moving around and exerting yourself.  The breathing machine keeps your lungs expanded and your temperature down. °· Do not place pillow under knee, focus on keeping the knee straight while resting ° °DIET °You may resume your previous home diet once your are discharged from the hospital. ° °DRESSING / WOUND CARE / SHOWERING °You may shower 3 days after surgery, but keep the wounds dry during showering.  You may use an occlusive plastic wrap (Press'n Seal for example), NO SOAKING/SUBMERGING IN THE BATHTUB.  If the  bandage gets wet, change with a clean dry gauze.  If the incision gets wet, pat the wound dry with a clean towel. °You may start showering once you are discharged home but do not submerge the incision under water. Just pat the incision dry and apply a dry gauze dressing on daily. °Change the surgical dressing daily and reapply a dry dressing each time. ° °ACTIVITY °Walk with your walker as instructed. °Use walker as long as suggested by your caregivers. °Avoid periods of inactivity such as sitting longer than an hour when not asleep. This helps prevent blood clots.  °You may resume a sexual relationship in one month or when given the OK by your doctor.  °You may return to work once you are cleared by your doctor.  °Do not drive a car for 6 weeks or until released by you surgeon.  °Do not drive while taking narcotics. ° °WEIGHT BEARING °Weight bearing as tolerated with assist device (walker, cane, etc) as directed, use it as long as suggested by your surgeon or therapist, typically at least 4-6 weeks. ° °POSTOPERATIVE CONSTIPATION PROTOCOL °Constipation - defined medically as fewer than three stools per week and severe constipation as less than one stool per week. ° °One of the most common issues patients have following surgery is constipation.  Even if you have a regular bowel pattern at home, your normal regimen is likely to be disrupted due to multiple reasons following surgery.  Combination of anesthesia, postoperative narcotics, change in appetite and fluid intake all can affect your bowels.    In order to avoid complications following surgery, here are some recommendations in order to help you during your recovery period. ° °Colace (docusate) - Pick up an over-the-counter form of Colace or another stool softener and take twice a day as long as you are requiring postoperative pain medications.  Take with a full glass of water daily.  If you experience loose stools or diarrhea, hold the colace until you stool forms  back up.  If your symptoms do not get better within 1 week or if they get worse, check with your doctor. ° °Dulcolax (bisacodyl) - Pick up over-the-counter and take as directed by the product packaging as needed to assist with the movement of your bowels.  Take with a full glass of water.  Use this product as needed if not relieved by Colace only.  ° °MiraLax (polyethylene glycol) - Pick up over-the-counter to have on hand.  MiraLax is a solution that will increase the amount of water in your bowels to assist with bowel movements.  Take as directed and can mix with a glass of water, juice, soda, coffee, or tea.  Take if you go more than two days without a movement. °Do not use MiraLax more than once per day. Call your doctor if you are still constipated or irregular after using this medication for 7 days in a row. ° °If you continue to have problems with postoperative constipation, please contact the office for further assistance and recommendations.  If you experience "the worst abdominal pain ever" or develop nausea or vomiting, please contact the office immediatly for further recommendations for treatment. ° °ITCHING ° If you experience itching with your medications, try taking only a single pain pill, or even half a pain pill at a time.  You can also use Benadryl over the counter for itching or also to help with sleep.  ° °TED HOSE STOCKINGS °Wear the elastic stockings on both legs for three weeks following surgery during the day but you may remove then at night for sleeping. ° °MEDICATIONS °See your medication summary on the “After Visit Summary” that the nursing staff will review with you prior to discharge.  You may have some home medications which will be placed on hold until you complete the course of blood thinner medication.  It is important for you to complete the blood thinner medication as prescribed by your surgeon.  Continue your approved medications as instructed at time of  discharge. ° °PRECAUTIONS °If you experience chest pain or shortness of breath - call 911 immediately for transfer to the hospital emergency department.  °If you develop a fever greater that 101 F, purulent drainage from wound, increased redness or drainage from wound, foul odor from the wound/dressing, or calf pain - CONTACT YOUR SURGEON.   °                                                °FOLLOW-UP APPOINTMENTS °Make sure you keep all of your appointments after your operation with your surgeon and caregivers. You should call the office at the above phone number and make an appointment for approximately two weeks after the date of your surgery or on the date instructed by your surgeon outlined in the "After Visit Summary". ° ° °RANGE OF MOTION AND STRENGTHENING EXERCISES  °Rehabilitation of the knee is important following a knee injury or   an operation. After just a few days of immobilization, the muscles of the thigh which control the knee become weakened and shrink (atrophy). Knee exercises are designed to build up the tone and strength of the thigh muscles and to improve knee motion. Often times heat used for twenty to thirty minutes before working out will loosen up your tissues and help with improving the range of motion but do not use heat for the first two weeks following surgery. These exercises can be done on a training (exercise) mat, on the floor, on a table or on a bed. Use what ever works the best and is most comfortable for you Knee exercises include:  °Leg Lifts - While your knee is still immobilized in a splint or cast, you can do straight leg raises. Lift the leg to 60 degrees, hold for 3 sec, and slowly lower the leg. Repeat 10-20 times 2-3 times daily. Perform this exercise against resistance later as your knee gets better.  °Quad and Hamstring Sets - Tighten up the muscle on the front of the thigh (Quad) and hold for 5-10 sec. Repeat this 10-20 times hourly. Hamstring sets are done by pushing the  foot backward against an object and holding for 5-10 sec. Repeat as with quad sets.  °· Leg Slides: Lying on your back, slowly slide your foot toward your buttocks, bending your knee up off the floor (only go as far as is comfortable). Then slowly slide your foot back down until your leg is flat on the floor again. °· Angel Wings: Lying on your back spread your legs to the side as far apart as you can without causing discomfort.  °A rehabilitation program following serious knee injuries can speed recovery and prevent re-injury in the future due to weakened muscles. Contact your doctor or a physical therapist for more information on knee rehabilitation.  ° °IF YOU ARE TRANSFERRED TO A SKILLED REHAB FACILITY °If the patient is transferred to a skilled rehab facility following release from the hospital, a list of the current medications will be sent to the facility for the patient to continue.  When discharged from the skilled rehab facility, please have the facility set up the patient's Home Health Physical Therapy prior to being released. Also, the skilled facility will be responsible for providing the patient with their medications at time of release from the facility to include their pain medication, the muscle relaxants, and their blood thinner medication. If the patient is still at the rehab facility at time of the two week follow up appointment, the skilled rehab facility will also need to assist the patient in arranging follow up appointment in our office and any transportation needs. ° °MAKE SURE YOU:  °Understand these instructions.  °Get help right away if you are not doing well or get worse.  ° ° °Pick up stool softner and laxative for home use following surgery while on pain medications. °Do not submerge incision under water. °Please use good hand washing techniques while changing dressing each day. °May shower starting three days after surgery. °Please use a clean towel to pat the incision dry following  showers. °Continue to use ice for pain and swelling after surgery. °Do not use any lotions or creams on the incision until instructed by your surgeon. ° °Take Xarelto for two and a half more weeks, then discontinue Xarelto. °Once the patient has completed the blood thinner regimen, then take a Baby 81 mg Aspirin daily for three more weeks. ° ° °Information   on my medicine - XARELTO (Rivaroxaban)  This medication education was reviewed with me or my healthcare representative as part of my discharge preparation.  The pharmacist that spoke with me during my hospital stay was:  Minda Ditto, [facility]  Why was Xarelto prescribed for you? Xarelto was prescribed for you to reduce the risk of blood clots forming after orthopedic surgery. The medical term for these abnormal blood clots is venous thromboembolism (VTE).  What do you need to know about xarelto ? Take your Xarelto ONCE DAILY at the same time every day. You may take it either with or without food.  If you have difficulty swallowing the tablet whole, you may crush it and mix in applesauce just prior to taking your dose.  Take Xarelto exactly as prescribed by your doctor and DO NOT stop taking Xarelto without talking to the doctor who prescribed the medication.  Stopping without other VTE prevention medication to take the place of Xarelto may increase your risk of developing a clot.  After discharge, you should have regular check-up appointments with your healthcare provider that is prescribing your Xarelto.    What do you do if you miss a dose? If you miss a dose, take it as soon as you remember on the same day then continue your regularly scheduled once daily regimen the next day. Do not take two doses of Xarelto on the same day.   Important Safety Information A possible side effect of Xarelto is bleeding. You should call your healthcare provider right away if you experience any of the following: ? Bleeding from an injury or your nose  that does not stop. ? Unusual colored urine (red or dark brown) or unusual colored stools (red or black). ? Unusual bruising for unknown reasons. ? A serious fall or if you hit your head (even if there is no bleeding).  Some medicines may interact with Xarelto and might increase your risk of bleeding while on Xarelto. To help avoid this, consult your healthcare provider or pharmacist prior to using any new prescription or non-prescription medications, including herbals, vitamins, non-steroidal anti-inflammatory drugs (NSAIDs) and supplements.  No interacting medications noted  This website has more information on Xarelto: [email or web address]

## 2014-11-18 NOTE — Evaluation (Signed)
Occupational Therapy Evaluation Patient Details Name: Karen Vazquez MRN: [id number] DOB: [birth year] Today's Date: 11/18/2014    History of Present Illness s/p L TKA   Clinical Impression   This 59 year old female was admitted for the above surgery. All education was completed.  No further OT is needed at this time.    Follow Up Recommendations  No OT follow up    Equipment Recommendations  None recommended by OT    Recommendations for Other Services       Precautions / Restrictions Precautions Precautions: Knee;Fall Required Braces or Orthoses: Knee Immobilizer - Left Knee Immobilizer - Left: Discontinue once straight leg raise with < 10 degree lag Restrictions Other Position/Activity Restrictions: WBAT      Mobility Bed Mobility            General bed mobility comments: oob  Transfers Overall transfer level: Needs assistance Equipment used: Rolling walker (2 wheeled) Transfers: Sit to/from Stand Sit to Stand: Min guard         General transfer comment: cues for hand placement and LLE management    Balance                                            ADL Overall ADL's : Needs assistance/impaired     Grooming: Wash/dry hands;Supervision/safety;Standing   Upper Body Bathing: Set up;Sitting   Lower Body Bathing: Minimal assistance;Sit to/from stand   Upper Body Dressing : Set up;Sitting   Lower Body Dressing: Moderate assistance;Sit to/from stand   Toilet Transfer: Min guard;Ambulation;BSC;RW   Toileting- Water quality scientist and Hygiene: Min guard;Sit to/from stand         General ADL Comments: Performed ADL from 3:1 commode; daughter present and will assist pt at home.  Educated on tub transfers:  she has a seat.       Vision     Perception     Praxis      Pertinent Vitals/Pain Pain Assessment: 0-10 Pain Score: 4  Pain Location: L knee Pain Descriptors / Indicators: Aching Pain Intervention(s): Limited  activity within patient's tolerance;Monitored during session;Premedicated before session;Repositioned;Ice applied     Hand Dominance     Extremity/Trunk Assessment Upper Extremity Assessment Upper Extremity Assessment: Overall WFL for tasks assessed          Communication Communication Communication: No difficulties   Cognition Arousal/Alertness: Awake/alert Behavior During Therapy: WFL for tasks assessed/performed Overall Cognitive Status: Within Functional Limits for tasks assessed                     General Comments       Exercises       Shoulder Instructions      Home Living Family/patient expects to be discharged to:: Private residence Living Arrangements: Spouse/significant other Available Help at Discharge: Family Type of Home: House Home Access: Stairs to enter Technical brewer of Steps: 1   Home Layout: One level     Bathroom Shower/Tub: Tub/shower unit Shower/tub characteristics: Architectural technologist: Standard     Home Equipment: Bedside commode;Shower seat          Prior Functioning/Environment Level of Independence: Independent             OT Diagnosis: Generalized weakness   OT Problem List:     OT Treatment/Interventions:      OT Goals(Current goals can be found in  the care plan section) Acute Rehab OT Goals Patient Stated Goal: return to I  OT Frequency:     Barriers to D/C:            Co-evaluation              End of Session    Activity Tolerance: Patient tolerated treatment well Patient left: with call bell/phone within reach;in chair   Time: 1130-1159 OT Time Calculation (min): 29 min Charges:  OT General Charges $OT Visit: 1 Procedure OT Evaluation $Initial OT Evaluation Tier I: 1 Procedure OT Treatments $Self Care/Home Management : 8-22 mins G-Codes:    Jamont Mellin 12/18/14, 12:33 PM Lesle Chris, OTR/L [phone] 12/18/14

## 2014-11-18 NOTE — Progress Notes (Signed)
Utilization review completed.  

## 2014-11-18 NOTE — Discharge Summary (Signed)
Physician Discharge Summary   Patient ID: Karen Vazquez MRN: [id number] DOB/AGE: 1955/08/26 59 y.o.  Admit date: 11/17/2014 Discharge date: 11/19/2014  Primary Diagnosis: Osteoarthritis Left knee(s)   Admission Diagnoses:  Past Medical History  Diagnosis Date  . Hypothyroidism   . Blood transfusion 2011    at [facility]  . Arthritis   . Restless leg syndrome   . Hypertension   . Sleep apnea     does not use c-pap machine   Discharge Diagnoses:   Principal Problem:   OA (osteoarthritis) of knee  Estimated body mass index is 40.49 kg/(m^2) as calculated from the following:   Height as of this encounter: 5' 4"  (1.626 m).   Weight as of this encounter: 107.049 kg (236 lb).  Procedure:  Procedure(s) (LRB): LEFT TOTAL KNEE ARTHROPLASTY (Left)   Consults: None  HPI: Karen Vazquez is a 59 y.o. year old female with end stage OA of her left knee with progressively worsening pain and dysfunction. She has constant pain, with activity and at rest and significant functional deficits with difficulties even with ADLs. She has had extensive non-op management including analgesics, injections of cortisone and viscosupplements, and home exercise program, but remains in significant pain with significant dysfunction. Radiographs show bone on bone arthritis medial and patellofemoral. She presents now for left Total Knee Arthroplasty.   Laboratory Data: Admission on 11/17/2014  Component Date Value Ref Range Status  . WBC 11/18/2014 11.2* 4.0 - 10.5 K/uL Final  . RBC 11/18/2014 4.40  3.87 - 5.11 MIL/uL Final  . Hemoglobin 11/18/2014 12.3  12.0 - 15.0 g/dL Final  . HCT 11/18/2014 38.0  36.0 - 46.0 % Final  . MCV 11/18/2014 86.4  78.0 - 100.0 fL Final  . MCH 11/18/2014 28.0  26.0 - 34.0 pg Final  . MCHC 11/18/2014 32.4  30.0 - 36.0 g/dL Final  . RDW 11/18/2014 13.6  11.5 - 15.5 % Final  . Platelets 11/18/2014 173  150 - 400 K/uL Final  . Sodium 11/18/2014 138  135 - 145 mmol/L Final  .  Potassium 11/18/2014 5.0  3.5 - 5.1 mmol/L Final  . Chloride 11/18/2014 104  101 - 111 mmol/L Final  . CO2 11/18/2014 29  22 - 32 mmol/L Final  . Glucose, Bld 11/18/2014 161* 65 - 99 mg/dL Final  . BUN 11/18/2014 11  6 - 20 mg/dL Final  . Creatinine, Ser 11/18/2014 0.83  0.44 - 1.00 mg/dL Final  . Calcium 11/18/2014 9.0  8.9 - 10.3 mg/dL Final  . GFR calc non Af Amer 11/18/2014 >60  >60 mL/min Final  . GFR calc Af Amer 11/18/2014 >60  >60 mL/min Final   Comment: (NOTE) The eGFR has been calculated using the CKD EPI equation. This calculation has not been validated in all clinical situations. eGFR's persistently <60 mL/min signify possible Chronic Kidney Disease.   . Anion gap 11/18/2014 5  5 - 15 Final  . Glucose-Capillary 11/17/2014 175* 65 - 99 mg/dL Final  Hospital Outpatient Visit on 11/10/2014  Component Date Value Ref Range Status  . aPTT 11/10/2014 38* 24 - 37 seconds Final   Comment:        IF BASELINE aPTT IS ELEVATED, SUGGEST PATIENT RISK ASSESSMENT BE USED TO DETERMINE APPROPRIATE ANTICOAGULANT THERAPY.   . WBC 11/10/2014 5.1  4.0 - 10.5 K/uL Final  . RBC 11/10/2014 4.62  3.87 - 5.11 MIL/uL Final  . Hemoglobin 11/10/2014 13.0  12.0 - 15.0 g/dL Final  . HCT 11/10/2014 40.5  36.0 - 46.0 % Final  . MCV 11/10/2014 87.7  78.0 - 100.0 fL Final  . MCH 11/10/2014 28.1  26.0 - 34.0 pg Final  . MCHC 11/10/2014 32.1  30.0 - 36.0 g/dL Final  . RDW 11/10/2014 13.9  11.5 - 15.5 % Final  . Platelets 11/10/2014 161  150 - 400 K/uL Final  . Sodium 11/10/2014 141  135 - 145 mmol/L Final  . Potassium 11/10/2014 4.6  3.5 - 5.1 mmol/L Final  . Chloride 11/10/2014 107  101 - 111 mmol/L Final  . CO2 11/10/2014 29  22 - 32 mmol/L Final  . Glucose, Bld 11/10/2014 86  65 - 99 mg/dL Final  . BUN 11/10/2014 12  6 - 20 mg/dL Final  . Creatinine, Ser 11/10/2014 0.88  0.44 - 1.00 mg/dL Final  . Calcium 11/10/2014 9.4  8.9 - 10.3 mg/dL Final  . Total Protein 11/10/2014 6.6  6.5 - 8.1 g/dL Final   . Albumin 11/10/2014 3.9  3.5 - 5.0 g/dL Final  . AST 11/10/2014 19  15 - 41 U/L Final  . ALT 11/10/2014 15  14 - 54 U/L Final  . Alkaline Phosphatase 11/10/2014 101  38 - 126 U/L Final  . Total Bilirubin 11/10/2014 0.8  0.3 - 1.2 mg/dL Final  . GFR calc non Af Amer 11/10/2014 >60  >60 mL/min Final  . GFR calc Af Amer 11/10/2014 >60  >60 mL/min Final   Comment: (NOTE) The eGFR has been calculated using the CKD EPI equation. This calculation has not been validated in all clinical situations. eGFR's persistently <60 mL/min signify possible Chronic Kidney Disease.   . Anion gap 11/10/2014 5  5 - 15 Final  . Prothrombin Time 11/10/2014 14.4  11.6 - 15.2 seconds Final  . INR 11/10/2014 1.10  0.00 - 1.49 Final  . ABO/RH(D) 11/10/2014 O NEG   Final  . Antibody Screen 11/10/2014 NEG   Final  . Sample Expiration 11/10/2014 11/20/2014   Final  . Color, Urine 11/10/2014 YELLOW  YELLOW Final  . APPearance 11/10/2014 CLEAR  CLEAR Final  . Specific Gravity, Urine 11/10/2014 1.012  1.005 - 1.030 Final  . pH 11/10/2014 5.5  5.0 - 8.0 Final  . Glucose, UA 11/10/2014 NEGATIVE  NEGATIVE mg/dL Final  . Hgb urine dipstick 11/10/2014 NEGATIVE  NEGATIVE Final  . Bilirubin Urine 11/10/2014 NEGATIVE  NEGATIVE Final  . Ketones, ur 11/10/2014 NEGATIVE  NEGATIVE mg/dL Final  . Protein, ur 11/10/2014 NEGATIVE  NEGATIVE mg/dL Final  . Urobilinogen, UA 11/10/2014 0.2  0.0 - 1.0 mg/dL Final  . Nitrite 11/10/2014 NEGATIVE  NEGATIVE Final  . Leukocytes, UA 11/10/2014 NEGATIVE  NEGATIVE Final   MICROSCOPIC NOT DONE ON URINES WITH NEGATIVE PROTEIN, BLOOD, LEUKOCYTES, NITRITE, OR GLUCOSE <1000 mg/dL.  Marland Kitchen MRSA, PCR 11/10/2014 NEGATIVE  NEGATIVE Final  . Staphylococcus aureus 11/10/2014 NEGATIVE  NEGATIVE Final   Comment:        The Xpert SA Assay (FDA approved for NASAL specimens in patients over 49 years of age), is one component of a comprehensive surveillance program.  Test performance has been validated by  [facility] for patients greater than or equal to 79 year old. It is not intended to diagnose infection nor to guide or monitor treatment.   Orders Only on 10/03/2014  Component Date Value Ref Range Status  . CYTOLOGY - PAP 10/03/2014 PAP RESULT   Final     X-Rays:No results found.  EKG: Orders placed or performed in visit on 07/14/14  .  EKG 12-Lead     Hospital Course: HAYLEI COBIN is a 59 y.o. who was admitted to [facility]. They were brought to the operating room on 11/17/2014 and underwent Procedure(s): LEFT TOTAL KNEE ARTHROPLASTY.  Patient tolerated the procedure well and was later transferred to the recovery room and then to the orthopaedic floor for postoperative care.  They were given PO and IV analgesics for pain control following their surgery.  They were given 24 hours of postoperative antibiotics of  Anti-infectives    Start     Dose/Rate Route Frequency Ordered Stop   11/17/14 2200  ceFAZolin (ANCEF) IVPB 2 g/50 mL premix     2 g 100 mL/hr over 30 Minutes Intravenous 4 times per day 11/17/14 1838 11/18/14 0622   11/17/14 1119  ceFAZolin (ANCEF) IVPB 2 g/50 mL premix     2 g 100 mL/hr over 30 Minutes Intravenous On call to O.R. 11/17/14 1119 11/17/14 1445   11/17/14 0600  ceFAZolin (ANCEF) IVPB 2 g/50 mL premix  Status:  Discontinued     2 g 100 mL/hr over 30 Minutes Intravenous On call to O.R. 11/16/14 1434 11/16/14 1436     and started on DVT prophylaxis in the form of Xarelto.   PT and OT were ordered for total joint protocol.  Discharge planning consulted to help with postop disposition and equipment needs.  Patient had a decent night on the evening of surgery.  They started to get up OOB with therapy on day one. Hemovac drain was pulled without difficulty.  Continued to work with therapy into day two.  Dressing was changed on day two and the incision was healing well.  Patient was seen in rounds and was ready to go home  Discharge home with home  health Diet - Cardiac diet Follow up - in 2 weeks Activity - WBAT Disposition - Home Condition Upon Discharge - Good D/C Meds - See DC Summary DVT Prophylaxis - Xarelto  Discharge Instructions    Call MD / Call 911    Complete by:  As directed   If you experience chest pain or shortness of breath, CALL 911 and be transported to the hospital emergency room.  If you develope a fever above 101 F, pus (white drainage) or increased drainage or redness at the wound, or calf pain, call your surgeon's office.     Change dressing    Complete by:  As directed   Change dressing daily with sterile 4 x 4 inch gauze dressing and apply TED hose. Do not submerge the incision under water.     Constipation Prevention    Complete by:  As directed   Drink plenty of fluids.  Prune juice may be helpful.  You may use a stool softener, such as Colace (over the counter) 100 mg twice a day.  Use MiraLax (over the counter) for constipation as needed.     Diet - low sodium heart healthy    Complete by:  As directed      Discharge instructions    Complete by:  As directed   Pick up stool softner and laxative for home use following surgery while on pain medications. Do not submerge incision under water. Please use good hand washing techniques while changing dressing each day. May shower starting three days after surgery. Please use a clean towel to pat the incision dry following showers. Continue to use ice for pain and swelling after surgery. Do not use any lotions or creams  on the incision until instructed by your surgeon.  Take Xarelto for two and a half more weeks, then discontinue Xarelto. Once the patient has completed the blood thinner regimen, then take a Baby 81 mg Aspirin daily for three more weeks.  Postoperative Constipation Protocol  Constipation - defined medically as fewer than three stools per week and severe constipation as less than one stool per week.  One of the most common issues patients  have following surgery is constipation.  Even if you have a regular bowel pattern at home, your normal regimen is likely to be disrupted due to multiple reasons following surgery.  Combination of anesthesia, postoperative narcotics, change in appetite and fluid intake all can affect your bowels.  In order to avoid complications following surgery, here are some recommendations in order to help you during your recovery period.  Colace (docusate) - Pick up an over-the-counter form of Colace or another stool softener and take twice a day as long as you are requiring postoperative pain medications.  Take with a full glass of water daily.  If you experience loose stools or diarrhea, hold the colace until you stool forms back up.  If your symptoms do not get better within 1 week or if they get worse, check with your doctor.  Dulcolax (bisacodyl) - Pick up over-the-counter and take as directed by the product packaging as needed to assist with the movement of your bowels.  Take with a full glass of water.  Use this product as needed if not relieved by Colace only.   MiraLax (polyethylene glycol) - Pick up over-the-counter to have on hand.  MiraLax is a solution that will increase the amount of water in your bowels to assist with bowel movements.  Take as directed and can mix with a glass of water, juice, soda, coffee, or tea.  Take if you go more than two days without a movement. Do not use MiraLax more than once per day. Call your doctor if you are still constipated or irregular after using this medication for 7 days in a row.  If you continue to have problems with postoperative constipation, please contact the office for further assistance and recommendations.  If you experience "the worst abdominal pain ever" or develop nausea or vomiting, please contact the office immediatly for further recommendations for treatment.     Do not put a pillow under the knee. Place it under the heel.    Complete by:  As directed       Do not sit on low chairs, stoools or toilet seats, as it may be difficult to get up from low surfaces    Complete by:  As directed      Driving restrictions    Complete by:  As directed   No driving until released by the physician.     Increase activity slowly as tolerated    Complete by:  As directed      Lifting restrictions    Complete by:  As directed   No lifting until released by the physician.     Patient may shower    Complete by:  As directed   You may shower without a dressing once there is no drainage.  Do not wash over the wound.  If drainage remains, do not shower until drainage stops.     TED hose    Complete by:  As directed   Use stockings (TED hose) for 3 weeks on both leg(s).  You may remove them  at night for sleeping.     Weight bearing as tolerated    Complete by:  As directed   Laterality:  left  Extremity:  Lower            Medication List    TAKE these medications        amLODipine 5 MG tablet  Commonly known as:  NORVASC  Take 1 tablet (5 mg total) by mouth daily.     ARMOUR THYROID 30 MG tablet  Generic drug:  thyroid  TAKE ONE TABLET ONCE DAILY     ARMOUR THYROID 15 MG tablet  Generic drug:  thyroid  TAKE ONE TABLET ONCE DAILY     methocarbamol 500 MG tablet  Commonly known as:  ROBAXIN  Take 1 tablet (500 mg total) by mouth every 6 (six) hours as needed for muscle spasms.     oxyCODONE 5 MG immediate release tablet  Commonly known as:  Oxy IR/ROXICODONE  Take 1-2 tablets (5-10 mg total) by mouth every 3 (three) hours as needed for moderate pain or severe pain.     pantoprazole 40 MG tablet  Commonly known as:  PROTONIX  Take 40 mg by mouth daily as needed (acid reflux). Rarely takes     potassium chloride SA 20 MEQ tablet  Commonly known as:  K-DUR,KLOR-CON  Take 1 tablet (20 mEq total) by mouth daily.     rivaroxaban 10 MG Tabs tablet  Commonly known as:  XARELTO  Take 1 tablet (10 mg total) by mouth daily with breakfast.  Take Xarelto for two and a half more weeks, then discontinue Xarelto. Once the patient has completed the blood thinner regimen, then take a Baby 81 mg Aspirin daily for three more weeks.     rOPINIRole 2 MG tablet  Commonly known as:  REQUIP  TAKE 1 TABLET EVERY        EVENING. NEW DOSE. PLEASE  MAKE AN APPOINTMENT WITH   YOUR DOCTOR.     traMADol 50 MG tablet  Commonly known as:  ULTRAM  Take 1-2 tablets (50-100 mg total) by mouth every 6 (six) hours as needed (mild pain).     zolpidem 5 MG tablet  Commonly known as:  AMBIEN  Take 1 tablet (5 mg total) by mouth at bedtime as needed for sleep.           Follow-up Information    Follow up with [facility].   Why:  rolling walker   Contact information:   [address] [place] [address] [phone]       Follow up with [facility].   Why:  home health physical therapy   Contact information:   [address] [place]  [address] [phone]       Follow up with Gearlean Alf, MD. Schedule an appointment as soon as possible for a visit on 12/02/2014.   Specialty:  Orthopedic Surgery   Why:  Call office at [phone] to setup appointment with Dr. Wynelle Link on Tuesday 12/02/2014.   Contact information:   [address] [place] [address] [phone]       Signed: Arlee Muslim, PA-C Orthopaedic Surgery 11/18/2014, 10:10 PM

## 2014-11-19 LAB — CBC
HEMATOCRIT: 34.2 % — AB (ref 36.0–46.0)
HEMOGLOBIN: 11.2 g/dL — AB (ref 12.0–15.0)
MCH: 28.9 pg (ref 26.0–34.0)
MCHC: 32.7 g/dL (ref 30.0–36.0)
MCV: 88.1 fL (ref 78.0–100.0)
Platelets: 184 10*3/uL (ref 150–400)
RBC: 3.88 MIL/uL (ref 3.87–5.11)
RDW: 14.2 % (ref 11.5–15.5)
WBC: 11.6 10*3/uL — ABNORMAL HIGH (ref 4.0–10.5)

## 2014-11-19 LAB — BASIC METABOLIC PANEL
Anion gap: 5 (ref 5–15)
BUN: 11 mg/dL (ref 6–20)
CHLORIDE: 106 mmol/L (ref 101–111)
CO2: 29 mmol/L (ref 22–32)
CREATININE: 0.72 mg/dL (ref 0.44–1.00)
Calcium: 8.9 mg/dL (ref 8.9–10.3)
GFR calc non Af Amer: >60 mL/min (ref >60)
Glucose, Bld: 109 mg/dL — ABNORMAL HIGH (ref 65–99)
Potassium: 4.6 mmol/L (ref 3.5–5.1)
Sodium: 140 mmol/L (ref 135–145)

## 2014-11-19 NOTE — Progress Notes (Signed)
   Subjective: 2 Days Post-Op Procedure(s) (LRB): LEFT TOTAL KNEE ARTHROPLASTY (Left) Patient reports pain as mild.   Patient seen in rounds with Dr. Wynelle Link. Patient is well, but has had some minor complaints of pain in the knee, requiring pain medications Patient is ready to go home  Objective: Vital signs in last 24 hours: Temp:  [98 F (36.7 C)-98.8 F (37.1 C)] 98.8 F (37.1 C) (08/31 0431) Pulse Rate:  [74-87] 84 (08/31 0431) Resp:  [16-18] 18 (08/31 0431) BP: (111-140)/(55-73) 132/73 mmHg (08/31 0431) SpO2:  [94 %-98 %] 98 % (08/31 0431)  Intake/Output from previous day:  Intake/Output Summary (Last 24 hours) at 11/19/14 0657 Last data filed at 11/19/14 0432  Gross per 24 hour  Intake    960 ml  Output   1450 ml  Net   -490 ml    Intake/Output this shift: Total I/O In: 480 [P.O.:480] Out: -   Labs:  Recent Labs  11/18/14 0704 11/19/14 0520  HGB 12.3 11.2*    Recent Labs  11/18/14 0704 11/19/14 0520  WBC 11.2* 11.6*  RBC 4.40 3.88  HCT 38.0 34.2*  PLT 173 184    Recent Labs  11/18/14 0704 11/19/14 0520  NA 138 140  K 5.0 4.6  CL 104 106  CO2 29 29  BUN 11 11  CREATININE 0.83 0.72  GLUCOSE 161* 109*  CALCIUM 9.0 8.9   No results for input(s): LABPT, INR in the last 72 hours.  EXAM: General - Patient is Alert, Appropriate and Oriented Extremity - Neurovascular intact Sensation intact distally Incision - clean, dry, no drainage Motor Function - intact, moving foot and toes well on exam.   Assessment/Plan: 2 Days Post-Op Procedure(s) (LRB): LEFT TOTAL KNEE ARTHROPLASTY (Left) Procedure(s) (LRB): LEFT TOTAL KNEE ARTHROPLASTY (Left) Past Medical History  Diagnosis Date  . Hypothyroidism   . Blood transfusion 2011    at [facility]  . Arthritis   . Restless leg syndrome   . Hypertension   . Sleep apnea     does not use c-pap machine   Principal Problem:   OA (osteoarthritis) of knee  Estimated body mass index is 40.49 kg/(m^2) as  calculated from the following:   Height as of this encounter: 5\' 4"  (1.626 m).   Weight as of this encounter: 107.049 kg (236 lb). Up with therapy Discharge home with home health Diet - Cardiac diet Follow up - in 2 weeks Activity - WBAT Disposition - Home Condition Upon Discharge - Good D/C Meds - See DC Summary DVT Prophylaxis - Xarelto  Arlee Muslim, PA-C Orthopaedic Surgery 11/19/2014, 6:57 AM

## 2014-11-19 NOTE — Progress Notes (Signed)
Physical Therapy Treatment Patient Details Name: Karen Vazquez MRN: [id number] DOB: Jul 29, 1955 Today's Date: 11/19/2014    History of Present Illness s/p L TKA    PT Comments    POD # 2 am session.  Pt eager to D/C to home.  Had other knee replacement recently and well educated.  Instructed on KI use for amb and stairs until able to perform active SLR.  Had daughter perform hands on transfers/amb and practice one step.  Performed all supine TKR TE's following HEP handout followed by ICE.   Follow Up Recommendations  Home health PT     Equipment Recommendations  Rolling walker with 5" wheels    Recommendations for Other Services       Precautions / Restrictions Precautions Precautions: Knee;Fall Precaution Comments: instructed pt on KI use for amb and stairs Required Braces or Orthoses: Knee Immobilizer - Left Knee Immobilizer - Left: Discontinue once straight leg raise with < 10 degree lag Restrictions Weight Bearing Restrictions: No Other Position/Activity Restrictions: WBAT    Mobility  Bed Mobility               General bed mobility comments: pt OOB in recliner  Transfers Overall transfer level: Needs assistance Equipment used: Rolling walker (2 wheeled) Transfers: Sit to/from Stand Sit to Stand: Supervision         General transfer comment: cues for hand placement and LLE management  Ambulation/Gait Ambulation/Gait assistance: Supervision Ambulation Distance (Feet): 85 Feet Assistive device: Rolling walker (2 wheeled)       General Gait Details: cues for RW position from self, sequence, to push RW   Stairs Stairs: Yes Stairs assistance: Min guard Stair Management: No rails;Step to pattern;Forwards;With walker Number of Stairs: 1 General stair comments: practiced one step twice with daughter and 25% Vc's on proper tech and sequencing  Wheelchair Mobility    Modified Rankin (Stroke Patients Only)       Balance                                    Cognition Arousal/Alertness: Awake/alert Behavior During Therapy: WFL for tasks assessed/performed Overall Cognitive Status: Within Functional Limits for tasks assessed                      Exercises   Total Knee Replacement TE's 10 reps B LE ankle pumps 10 reps towel squeezes 10 reps knee presses 10 reps heel slides  10 reps SAQ's 10 reps SLR's 10 reps ABD Followed by ICE     General Comments        Pertinent Vitals/Pain Pain Assessment: 0-10 Pain Score: 2  Pain Location: l knee Pain Descriptors / Indicators: Constant;Sore Pain Intervention(s): Monitored during session;Premedicated before session;Repositioned;Ice applied    Home Living                      Prior Function            PT Goals (current goals can now be found in the care plan section) Progress towards PT goals: Progressing toward goals    Frequency       PT Plan Current plan remains appropriate    Co-evaluation             End of Session Equipment Utilized During Treatment: Gait belt;Left knee immobilizer Activity Tolerance: Patient tolerated treatment well Patient left: in chair;with call bell/phone within  reach;with family/visitor present     Time: 6283-1517 PT Time Calculation (min) (ACUTE ONLY): 30 min  Charges:  $Gait Training: 8-22 mins $Therapeutic Exercise: 8-22 mins                    G Codes:      Rica Koyanagi  PTA [facility]  Acute  Rehab Pager      [phone]

## 2014-11-26 ENCOUNTER — Telehealth: Payer: Self-pay | Admitting: Family Medicine

## 2014-11-26 MED ORDER — POTASSIUM CHLORIDE CRYS ER 20 MEQ PO TBCR: 20.0000 meq | EXTENDED_RELEASE_TABLET | Freq: Every day | ORAL | Status: DC

## 2014-11-26 MED ORDER — HYDROCHLOROTHIAZIDE 25 MG PO TABS: 25.0000 mg | ORAL_TABLET | Freq: Every day | ORAL | Status: DC

## 2014-11-26 NOTE — Telephone Encounter (Signed)
Pt's bp med was changed bc she was going to have knee surgery and she thought getting up and going to bathroom was going to be a problem after knee replacement. Pt was taking hctz and potassium. Changed to norvasc. Pt states bp running in the 140's - 150's and bottom number in the 80's. Pt states she would like to go back to original meds bc she is doing well with knee replacement and able to get up and down to go to bathroom fine.

## 2014-11-26 NOTE — Telephone Encounter (Signed)
Pt called stating that she would like to speak to a nurse regarding her bp meds. Pt states that Dr. Nicki Reaper changed her meds due to knee therapy but her bp is running high and the therapist is suggesting she call and see if he needs to change it back.

## 2014-11-26 NOTE — Telephone Encounter (Signed)
May d/c amlodipine. Use HCTZ 25 mg qd and potassium 20 meq qd, 30 day rx on each with 4 rf, rec ov in Oct to check BP

## 2014-11-26 NOTE — Telephone Encounter (Signed)
Spoke with patient and informed her per Dr.Scott Luking-to stop Amlodipine and start back with HCTZ 25 MG every day, and Potassium 20 meq every day. Also informed patient that Dr.Scott recommends office visit in October to check blood pressure. Patient verbalized understanding.

## 2014-12-09 ENCOUNTER — Ambulatory Visit (HOSPITAL_COMMUNITY): Payer: Federal, State, Local not specified - PPO | Attending: Orthopedic Surgery | Admitting: Physical Therapy

## 2014-12-09 ENCOUNTER — Other Ambulatory Visit: Payer: Self-pay | Admitting: Family Medicine

## 2014-12-09 DIAGNOSIS — Z96652 Presence of left artificial knee joint: Secondary | ICD-10-CM

## 2014-12-09 DIAGNOSIS — M6289 Other specified disorders of muscle: Secondary | ICD-10-CM | POA: Diagnosis present

## 2014-12-09 DIAGNOSIS — R609 Edema, unspecified: Secondary | ICD-10-CM | POA: Diagnosis present

## 2014-12-09 DIAGNOSIS — M6281 Muscle weakness (generalized): Secondary | ICD-10-CM

## 2014-12-09 DIAGNOSIS — M25562 Pain in left knee: Secondary | ICD-10-CM | POA: Insufficient documentation

## 2014-12-09 DIAGNOSIS — M25662 Stiffness of left knee, not elsewhere classified: Secondary | ICD-10-CM | POA: Diagnosis present

## 2014-12-09 DIAGNOSIS — R262 Difficulty in walking, not elsewhere classified: Secondary | ICD-10-CM | POA: Insufficient documentation

## 2014-12-09 DIAGNOSIS — R29898 Other symptoms and signs involving the musculoskeletal system: Secondary | ICD-10-CM | POA: Insufficient documentation

## 2014-12-09 NOTE — Therapy (Signed)
[facility] [address] 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 Hypothyroidism   . Blood transfusion 2011    at [facility]  . Arthritis   . Restless leg syndrome   . Hypertension   . Sleep apnea     does not use c-pap machine    Past Surgical History  Procedure Laterality Date  . Joint replacement  2011    rt knee  . Tubal ligation    . Appendectomy    . Tonsillectomy    . Vaginal hysterectomy  12/29/2010    Procedure: HYSTERECTOMY VAGINAL;  Surgeon: Daria Pastures;  Location: [facility];  Service: Gynecology;  Laterality: N/A;  . Bladder suspension  12/29/2010    Procedure: TRANSVAGINAL TAPE (TVT) PROCEDURE;  Surgeon: Daria Pastures;  Location: [facility];  Service: Gynecology;  Laterality: N/A;  . Cystocele repair  12/29/2010    Procedure: ANTERIOR REPAIR (CYSTOCELE);  Surgeon: Daria Pastures;  Location: [facility];  Service: Gynecology;  Laterality: N/A;  . Cystoscopy  12/29/2010    Procedure: CYSTOSCOPY;  Surgeon: Daria Pastures;  Location: [facility];  Service: Gynecology;  Laterality: N/A;  . Replacement total knee Right 2011  . Breast surgery      left breast lumpectomy 1977  . Total knee arthroplasty Left 11/17/2014    Procedure: LEFT TOTAL KNEE ARTHROPLASTY;  Surgeon: Gaynelle Arabian, MD;  Location: [facility];  Service: Orthopedics;  Laterality: Left;    There were no vitals filed for this visit.  Visit Diagnosis:  Status post total left knee replacement - Plan: PT plan of care cert/re-cert  Knee stiffness, left - Plan: PT plan of care cert/re-cert  Difficulty walking - Plan: PT plan of care cert/re-cert  Edema - Plan: PT plan of care cert/re-cert  Left knee pain - Plan: PT plan of care cert/re-cert  Left leg weakness - Plan: PT plan of care cert/re-cert  Proximal muscle weakness - Plan: PT plan of care cert/re-cert      Subjective Assessment - 12/09/14 1111    Subjective If she gets up and moves at least once an hour, but can get tight if it is in one postion for a long period. Tries to move her knee a lot. Walking is going good, stairs also are not too hard right now.    Pertinent History Patient had a L knee replacement done on August 29th by Dr. Wynelle Link; patient did get HHPT and has been discharged from thier services. Not currently using an assistive device.    How long can you sit comfortably? 45 minutes before it starts to feel tight    How long can you stand comfortably? 25-30 minutes    How long can  you walk comfortably? 20 minutes    Patient Stated Goals be able to bend knee like MD wants, get rid of tightness    Currently in Pain? No/denies            [facility] PT Assessment - 12/09/14 0001    Assessment   Medical Diagnosis L total knee    Onset Date/Surgical Date 11/17/14   Next MD Visit October 4th with Dr. Wynelle Link    Precautions   Precautions None   Restrictions   Weight Bearing Restrictions No   Balance Screen   Has the patient fallen in the past 6 months No   Has the patient had a decrease in activity level because of a fear of falling?  Yes   Is the patient reluctant to leave their home because of a fear of falling?  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; up and down all day, more sitting than standing    Leisure music, travelling    Observation/Other Assessments   Observations incision appears well healing with no signs of acute inflammation    Posture/Postural Control   Posture Comments flexed at hips, forward head with B IR shoulders, reduced weight bearing L LE, pronation in stance    AROM   Left Knee Extension 10   Left Knee Flexion 104   Strength   Right Hip Flexion 4/5   Right Hip Extension 3/5   Right Hip ABduction 4+/5   Left Hip Flexion 3/5   Left Hip Extension 3/5   Left Hip ABduction 3+/5   Right Knee Flexion 5/5   Right Knee Extension 5/5   Left Knee Flexion 4/5   Left Knee Extension 4/5   Right Ankle Dorsiflexion 5/5   Left Ankle Dorsiflexion 4/5   Ambulation/Gait   Gait Comments proximal muscle weakness, pronation B, reduced gait speed, reduced rotation of trunk and pelvis, reduced L knee TKE, reduced stance L LE/reduced step R LE                    [facility] Adult PT Treatment/Exercise - 12/09/14 0001    Knee/Hip Exercises: Stretches   Active Hamstring Stretch Both;3 reps;30 seconds   Active Hamstring Stretch Limitations 12 inch box    Quad Stretch 3 reps;Left;30 seconds   Quad Stretch Limitations prone with rope    Knee: Self-Stretch to increase Flexion Left;Other (comment)  10x, 5 second holds    Gastroc Stretch Both;3 reps;30 seconds   Gastroc Stretch Limitations slantboard                 PT Education - 12/09/14 1149    Education provided Yes   Education Details prognosis, plan of care moving forward, advised to continue with HHPT HEP    Person(s) Educated Patient   Methods Explanation   Comprehension Verbalized understanding          PT Short Term Goals - 12/09/14 1158    PT SHORT TERM GOAL #1   Title Patient will demonstrate L knee extension no more than  5 degrees and L knee flexion no less than 110 degrees    Time 2   Period Weeks   Status New   PT SHORT TERM GOAL #2   Title Patient will demonstrate improved gait mechanics including equal step lenghts, full L LE TKE, improved proximal muscle  strength, and correct posture thorughout gait mechancis    Time 2   Period Weeks   Status New   PT SHORT TERM GOAL #3   Title Patient will be able to perform functional activities for at least 2 hours with no increase in pain or edema L knee, no assistive device    Time 2   Period Weeks   Status New   PT SHORT TERM GOAL #4   Title Patient will be independent in correctly and consistently performing appropriate advanced HEP, to be progressed PRN    Time 2   Period Weeks   Status New           PT Long Term Goals - 12/09/14 1201    PT LONG TERM GOAL #1   Title (p) Patient will demonstrate L knee extension of 0 degrees and L knee flexion of at least 120 degrees    Time (p) 4   Period (p) Weeks   Status (p) New   PT LONG TERM GOAL #2   Title (p) Patient will be unlimited in her tolerance of functional activities performed in standing with no increase in pain and equal weight bearing each LE    Time (p) 4   Period (p) Weeks   Status (p) New   PT LONG TERM GOAL #3   Title (p) Patient will be able to reciprocally ascend/descend stairs with no railings, no circumduction, no increase pain, minimal unsteadiness    Time (p) 4   Period (p) Weeks   Status (p) New   PT LONG TERM GOAL #4   Title (p) Patient will report that she has been able to perform exercise of light-moderate activity for at least 30 minutes, at least 3 times per week, for improbed health benefits and maintenance of functional gains    Time (p) 4   Period (p) Weeks   Status (p) New   PT LONG TERM GOAL #5   Title (p) Patient will demonstrate at least 4+/5 strength in all tested muscles in bilateral lower extremities and in proximal region    Time (p) 4   Period (p) Weeks    Status (p) New               Plan - 12/09/14 1154    Clinical Impression Statement Patient presents status-post total L knee replacement with L knee stiffness, L LE and proximal muscle weakness, postural and gait impairment, L knee soreness, slight edema, and reduced functional task performance skills. Patient reports that overall things are giong very well and that this knee is actually feeling better than the last one that she had done. Patient reports that she is very adherent to her HEP as assigned by HHPT. At this time patient willl benefit from  skilled PT services in order to address her functional impairments and assist her in reaching an optimal level of function.    Pt will benefit from skilled therapeutic intervention in order to improve on the following deficits Abnormal gait;Decreased endurance;Hypomobility;Decreased activity tolerance;Decreased strength;Pain;Decreased balance;Difficulty walking;Improper body mechanics;Decreased coordination;Impaired flexibility;Postural dysfunction   Rehab Potential Excellent   PT Frequency 3x / week   PT Duration 4 weeks   PT Treatment/Interventions ADLs/Self Care Home Management;Cryotherapy;Gait training;Stair training;Functional mobility training;Therapeutic activities;Therapeutic exercise;Balance training;Neuromuscular re-education;Patient/family education;Manual techniques;Scar mobilization   PT Next Visit Plan review initial eval and goals; functional stretching, strengthening, gait training    PT Home Exercise Plan patient currrently doing HHPT HEP, to be updated PRN  Consulted and Agree with Plan of Care Patient         Problem List Patient Active Problem List   Diagnosis Date Noted  . OA (osteoarthritis) of knee 11/17/2014  . HTN (hypertension) 05/28/2014  . Obstructive sleep apnea 05/28/2014  . Anemia, iron deficiency 12/16/2013  . Prediabetes 05/01/2013  . GERD (gastroesophageal reflux disease) 02/06/2013  .  Hypothyroidism 12/12/2012  . Insomnia 12/12/2012  . Hyperlipidemia 12/12/2012  . Osteoarthritis 12/12/2012  . Restless legs syndrome 12/12/2012    Deniece Ree PT, DPT [facility] [address] [place], [place], [address] Phone: [phone]   Fax:  [phone]

## 2014-12-10 ENCOUNTER — Ambulatory Visit (HOSPITAL_COMMUNITY): Payer: Federal, State, Local not specified - PPO | Admitting: Physical Therapy

## 2014-12-10 DIAGNOSIS — R29898 Other symptoms and signs involving the musculoskeletal system: Secondary | ICD-10-CM

## 2014-12-10 DIAGNOSIS — M6281 Muscle weakness (generalized): Secondary | ICD-10-CM

## 2014-12-10 DIAGNOSIS — Z96652 Presence of left artificial knee joint: Secondary | ICD-10-CM

## 2014-12-10 DIAGNOSIS — R262 Difficulty in walking, not elsewhere classified: Secondary | ICD-10-CM

## 2014-12-10 DIAGNOSIS — R609 Edema, unspecified: Secondary | ICD-10-CM

## 2014-12-10 DIAGNOSIS — M25562 Pain in left knee: Secondary | ICD-10-CM

## 2014-12-10 DIAGNOSIS — M25662 Stiffness of left knee, not elsewhere classified: Secondary | ICD-10-CM

## 2014-12-10 NOTE — Therapy (Signed)
[facility] [place], [place], [address] Phone: [phone]   Fax:  [phone]  Physical Therapy Treatment  Patient Details  Name: Karen Vazquez MRN: [id number] Date of Birth: 1955-06-16 Referring Provider:  Kathyrn Drown, MD  Encounter Date: 12/10/2014      PT End of Session - 12/10/14 0934    Visit Number 2   Number of Visits 12   Date for PT Re-Evaluation 01/06/15   Authorization Type [employer] Federal Employee    Authorization Time Period 12/09/14 to 02/08/15   PT Start Time 0800   PT Stop Time 0847   PT Time Calculation (min) 47 min   Activity Tolerance Patient tolerated treatment well   Behavior During Therapy [facility] for tasks assessed/performed      Past Medical History  Diagnosis Date  . Hypothyroidism   . Blood transfusion 2011    at [facility]  . Arthritis   . Restless leg syndrome   . Hypertension   . Sleep apnea     does not use c-pap machine    Past Surgical History  Procedure Laterality Date  . Joint replacement  2011    rt knee  . Tubal ligation    . Appendectomy    . Tonsillectomy    . Vaginal hysterectomy  12/29/2010    Procedure: HYSTERECTOMY VAGINAL;  Surgeon: Daria Pastures;  Location: [facility];  Service: Gynecology;  Laterality: N/A;  . Bladder suspension  12/29/2010    Procedure: TRANSVAGINAL TAPE (TVT) PROCEDURE;  Surgeon: Daria Pastures;  Location: [facility];  Service: Gynecology;  Laterality: N/A;  . Cystocele repair  12/29/2010    Procedure: ANTERIOR REPAIR (CYSTOCELE);  Surgeon: Daria Pastures;  Location: [facility];  Service: Gynecology;  Laterality: N/A;  . Cystoscopy  12/29/2010    Procedure: CYSTOSCOPY;  Surgeon: Daria Pastures;  Location: [facility];  Service: Gynecology;  Laterality: N/A;  . Replacement total knee Right 2011  . Breast surgery      left breast lumpectomy 1977  . Total knee arthroplasty Left 11/17/2014    Procedure: LEFT TOTAL KNEE ARTHROPLASTY;  Surgeon: Gaynelle Arabian, MD;  Location: [facility];  Service: Orthopedics;  Laterality: Left;    There were no vitals filed for this visit.  Visit Diagnosis:  Status post total left knee replacement  Knee stiffness, left  Difficulty walking  Edema  Left knee pain  Left leg weakness  Proximal muscle weakness      Subjective Assessment - 12/10/14 0942    Subjective Pt reports compliance wtih HEP.  Currently without pain just stiffness.   Currently in Pain? No/denies                         [facility] Adult PT Treatment/Exercise - 12/10/14 0927    Knee/Hip Exercises: Stretches   Active Hamstring Stretch Both;3 reps;30 seconds   Active Hamstring Stretch Limitations 12 inch box    Knee: Self-Stretch to increase Flexion Left;Other (comment)   Knee: Self-Stretch Limitations 12"   Gastroc Stretch Both;3 reps;30 seconds   Gastroc Stretch Limitations slantboard    Knee/Hip Exercises: Supine   Knee Extension AROM;PROM   Knee Extension Limitations 5   Knee Flexion AROM;PROM   Knee Flexion Limitations 105, 108   Manual Therapy   Manual Therapy Soft tissue mobilization;Myofascial release   Manual therapy comments Lt knee   Soft tissue mobilization scar mobilization   Myofascial Release to surrounding  tissue                PT Education - 12/10/14 0940    Education provided Yes   Education Details given copy of written evaluation and explanation of measurements and goals. Encouraged to use bike at home   Person(s) Educated Patient   Methods Explanation;Demonstration;Handout   Comprehension Verbalized understanding          PT Short Term Goals - 12/09/14 1158    PT SHORT TERM GOAL #1   Title Patient will demonstrate L knee extension no more than 5 degrees and L knee flexion no less than 110 degrees    Time 2   Period Weeks   Status New   PT SHORT TERM GOAL #2   Title Patient will demonstrate improved gait mechanics including equal step lenghts, full L LE TKE, improved  proximal muscle strength, and correct posture thorughout gait mechancis    Time 2   Period Weeks   Status New   PT SHORT TERM GOAL #3   Title Patient will be able to perform functional activities for at least 2 hours with no increase in pain or edema L knee, no assistive device    Time 2   Period Weeks   Status New   PT SHORT TERM GOAL #4   Title Patient will be independent in correctly and consistently performing appropriate advanced HEP, to be progressed PRN    Time 2   Period Weeks   Status New           PT Long Term Goals - 12/10/14 9604    PT LONG TERM GOAL #1   Title Patient will demonstrate L knee extension of 0 degrees and L knee flexion of at least 120 degrees    Time 4   Period Weeks   Status New   PT LONG TERM GOAL #2   Title Patient will be unlimited in her tolerance of functional activities performed in standing with no increase in pain and equal weight bearing each LE    Time 4   Period Weeks   Status New   PT LONG TERM GOAL #3   Title Patient will be able to reciprocally ascend/descend stairs with no railings, no circumduction, no increase pain, minimal unsteadiness    Time 4   Period Weeks   Status New   PT LONG TERM GOAL #4   Title Patient will report that she has been able to perform exercise of light-moderate activity for at least 30 minutes, at least 3 times per week, for improbed health benefits and maintenance of functional gains    Time 4   Period Weeks   Status New   PT LONG TERM GOAL #5   Title Patient will demonstrate at least 4+/5 strength in all tested muscles in bilateral lower extremities and in proximal region    Time 4   Period Weeks   Status New               Plan - 12/10/14 0935    Clinical Impression Statement Pt reports complaince with established HEP from [place].  Continued with stretches for gastroc and hamstring.  Added manual to reduce adhesions around Lt knee and scar tissue.  Pt able to achieve 5-105 AROM and with AAROM  able to achieve 108 degrees.  Added bike to increase ROM and encouraged patient to use her bike at home and continue HEP.  Pt given copy of written evaluation and explained goals and  measuremetns.    PT Next Visit Plan Continue to progress functional strength and ROM of Lt knee.  Begin lunges and step ups next session as well as hip strengthening exercises.          Problem List Patient Active Problem List   Diagnosis Date Noted  . OA (osteoarthritis) of knee 11/17/2014  . HTN (hypertension) 05/28/2014  . Obstructive sleep apnea 05/28/2014  . Anemia, iron deficiency 12/16/2013  . Prediabetes 05/01/2013  . GERD (gastroesophageal reflux disease) 02/06/2013  . Hypothyroidism 12/12/2012  . Insomnia 12/12/2012  . Hyperlipidemia 12/12/2012  . Osteoarthritis 12/12/2012  . Restless legs syndrome 12/12/2012    Teena Irani, PTA/CLT [phone]  12/10/2014, 9:43 AM  [facility] [address] [place], [place], [address] Phone: [phone]   Fax:  [phone]

## 2014-12-12 ENCOUNTER — Ambulatory Visit (HOSPITAL_COMMUNITY): Payer: Federal, State, Local not specified - PPO | Admitting: Physical Therapy

## 2014-12-12 DIAGNOSIS — Z96652 Presence of left artificial knee joint: Secondary | ICD-10-CM

## 2014-12-12 DIAGNOSIS — M25662 Stiffness of left knee, not elsewhere classified: Secondary | ICD-10-CM

## 2014-12-12 DIAGNOSIS — R609 Edema, unspecified: Secondary | ICD-10-CM

## 2014-12-12 DIAGNOSIS — R262 Difficulty in walking, not elsewhere classified: Secondary | ICD-10-CM

## 2014-12-12 DIAGNOSIS — M25562 Pain in left knee: Secondary | ICD-10-CM

## 2014-12-12 DIAGNOSIS — R29898 Other symptoms and signs involving the musculoskeletal system: Secondary | ICD-10-CM

## 2014-12-12 NOTE — Therapy (Signed)
[place], [place], [address] Phone: [phone]   Fax:  [phone]  Physical Therapy Treatment  Patient Details  Name: Karen Vazquez MRN: [id number] Date of Birth: [birth year] Referring Provider:  Kathyrn Drown, MD  Encounter Date: 12/12/2014      PT End of Session - 12/12/14 1212    Visit Number 3   Number of Visits 12   Date for PT Re-Evaluation 01/06/15   Authorization Type [employer] Employee    Authorization Time Period 12/09/14 to 02/08/15   PT Start Time 1015   PT Stop Time 1103   PT Time Calculation (min) 48 min   Activity Tolerance Patient tolerated treatment well   Behavior During Therapy [facility] for tasks assessed/performed      Past Medical History  Diagnosis Date  . Hypothyroidism   . Blood transfusion 2011    at [facility]  . Arthritis   . Restless leg syndrome   . Hypertension   . Sleep apnea     does not use c-pap machine    Past Surgical History  Procedure Laterality Date  . Joint replacement  2011    rt knee  . Tubal ligation    . Appendectomy    . Tonsillectomy    . Vaginal hysterectomy  12/29/2010    Procedure: HYSTERECTOMY VAGINAL;  Surgeon: Daria Pastures;  Location: [facility];  Service: Gynecology;  Laterality: N/A;  . Bladder suspension  12/29/2010    Procedure: TRANSVAGINAL TAPE (TVT) PROCEDURE;  Surgeon: Daria Pastures;  Location: [facility];  Service: Gynecology;  Laterality: N/A;  . Cystocele repair  12/29/2010    Procedure: ANTERIOR REPAIR (CYSTOCELE);  Surgeon: Daria Pastures;  Location: [facility];  Service: Gynecology;  Laterality: N/A;  . Cystoscopy  12/29/2010    Procedure: CYSTOSCOPY;  Surgeon: Daria Pastures;  Location: [facility];  Service: Gynecology;  Laterality: N/A;  . Replacement total knee Right 2011  . Breast surgery      left breast lumpectomy 1977  . Total knee arthroplasty Left 11/17/2014    Procedure: LEFT TOTAL KNEE ARTHROPLASTY;  Surgeon: Gaynelle Arabian, MD;  Location: [facility];  Service: Orthopedics;  Laterality: Left;    There were no vitals filed for this visit.  Visit Diagnosis:  Status post total left knee replacement  Knee stiffness, left  Difficulty walking  Edema  Left knee pain  Left leg weakness      Subjective Assessment - 12/12/14 1022    Subjective Pt reports that she had a lot of soreness yesterday, but today her pain is a little better. She took a pain pill before coming to therapy.   Currently in Pain? Yes   Pain Score 4    Pain Location Knee   Pain Orientation Left                         [facility] Adult PT Treatment/Exercise - 12/12/14 0001    Knee/Hip Exercises: Stretches   Active Hamstring Stretch Both;3 reps;30 seconds   Active Hamstring Stretch Limitations 12 inch box    Knee: Self-Stretch to increase Flexion Left;10 seconds   Knee: Self-Stretch Limitations 10 reps   Gastroc Stretch Both;3 reps;30 seconds   Gastroc Stretch Limitations slantboard    Knee/Hip Exercises: Aerobic   Stationary Bike 8' at seat 9   Knee/Hip Exercises: Standing   Forward Lunges 15 reps   Forward Lunges Limitations 6" step  Side Lunges 15 reps   Side Lunges Limitations 6" step   Lateral Step Up 10 reps;Step Height: 4"   Forward Step Up 10 reps;Step Height: 4"   Functional Squat 10 reps   Functional Squat Limitations at mat table   Rocker Board 2 minutes   SLS 3 reps, 9" max LLE, 12" max RLE                  PT Short Term Goals - 12/09/14 1158    PT SHORT TERM GOAL #1   Title Patient will demonstrate L knee extension no more than 5 degrees and L knee flexion no less than 110 degrees    Time 2   Period Weeks   Status New   PT SHORT TERM GOAL #2   Title Patient will demonstrate improved gait mechanics including equal step lenghts, full L LE TKE, improved proximal muscle strength, and correct posture thorughout gait mechancis    Time 2   Period Weeks   Status New   PT SHORT TERM  GOAL #3   Title Patient will be able to perform functional activities for at least 2 hours with no increase in pain or edema L knee, no assistive device    Time 2   Period Weeks   Status New   PT SHORT TERM GOAL #4   Title Patient will be independent in correctly and consistently performing appropriate advanced HEP, to be progressed PRN    Time 2   Period Weeks   Status New           PT Long Term Goals - 12/10/14 2585    PT LONG TERM GOAL #1   Title Patient will demonstrate L knee extension of 0 degrees and L knee flexion of at least 120 degrees    Time 4   Period Weeks   Status New   PT LONG TERM GOAL #2   Title Patient will be unlimited in her tolerance of functional activities performed in standing with no increase in pain and equal weight bearing each LE    Time 4   Period Weeks   Status New   PT LONG TERM GOAL #3   Title Patient will be able to reciprocally ascend/descend stairs with no railings, no circumduction, no increase pain, minimal unsteadiness    Time 4   Period Weeks   Status New   PT LONG TERM GOAL #4   Title Patient will report that she has been able to perform exercise of light-moderate activity for at least 30 minutes, at least 3 times per week, for improbed health benefits and maintenance of functional gains    Time 4   Period Weeks   Status New   PT LONG TERM GOAL #5   Title Patient will demonstrate at least 4+/5 strength in all tested muscles in bilateral lower extremities and in proximal region    Time 4   Period Weeks   Status New               Plan - 12/12/14 1213    Clinical Impression Statement Continued with BLE strengthening, progressing to standing strengthening today. Pt was able to complete lunges at 6" step and step ups at 4" step without increased pain and with good form. Treatment session ended with stationary bike to improve knee flexion ROM, and pt was able to complete full revolutions at seat 9 with no pain. Pt reported  decreased pain and stiffness post treatment.  PT Next Visit Plan Continue with standing strengthening, reintroduce manual therapy to improve ROM        Problem List Patient Active Problem List   Diagnosis Date Noted  . OA (osteoarthritis) of knee 11/17/2014  . HTN (hypertension) 05/28/2014  . Obstructive sleep apnea 05/28/2014  . Anemia, iron deficiency 12/16/2013  . Prediabetes 05/01/2013  . GERD (gastroesophageal reflux disease) 02/06/2013  . Hypothyroidism 12/12/2012  . Insomnia 12/12/2012  . Hyperlipidemia 12/12/2012  . Osteoarthritis 12/12/2012  . Restless legs syndrome 12/12/2012    Hilma Favors, PT, DPT [phone] 12/12/2014, 12:16 PM  [place], [place], [address] Phone: [phone]   Fax:  [phone]

## 2014-12-15 ENCOUNTER — Ambulatory Visit (HOSPITAL_COMMUNITY): Payer: Federal, State, Local not specified - PPO

## 2014-12-15 DIAGNOSIS — Z96652 Presence of left artificial knee joint: Secondary | ICD-10-CM | POA: Diagnosis not present

## 2014-12-15 DIAGNOSIS — R609 Edema, unspecified: Secondary | ICD-10-CM

## 2014-12-15 DIAGNOSIS — M6281 Muscle weakness (generalized): Secondary | ICD-10-CM

## 2014-12-15 DIAGNOSIS — M25662 Stiffness of left knee, not elsewhere classified: Secondary | ICD-10-CM

## 2014-12-15 DIAGNOSIS — M25562 Pain in left knee: Secondary | ICD-10-CM

## 2014-12-15 DIAGNOSIS — R29898 Other symptoms and signs involving the musculoskeletal system: Secondary | ICD-10-CM

## 2014-12-15 DIAGNOSIS — R262 Difficulty in walking, not elsewhere classified: Secondary | ICD-10-CM

## 2014-12-15 NOTE — Therapy (Signed)
Karen Vazquez, [place], [address] Phone: [phone]   Fax:  [phone]  Physical Therapy Treatment  Patient Details  Name: Karen Vazquez MRN: [id number] Date of Birth: Dec 02, 1955 Referring Provider:  Gaynelle Arabian, MD  Encounter Date: 12/15/2014      PT End of Session - 12/15/14 1043    Visit Number 4   Number of Visits 12   Date for PT Re-Evaluation 01/06/15   Authorization Type [employer] Employee    Authorization Time Period 12/09/14 to 02/08/15   PT Start Time 1013   PT Stop Time 1054   PT Time Calculation (min) 41 min   Activity Tolerance Patient tolerated treatment well   Behavior During Therapy [facility] for tasks assessed/performed      Past Medical History  Diagnosis Date  . Hypothyroidism   . Blood transfusion 2011    at [facility]  . Arthritis   . Restless leg syndrome   . Hypertension   . Sleep apnea     does not use c-pap machine    Past Surgical History  Procedure Laterality Date  . Joint replacement  2011    rt knee  . Tubal ligation    . Appendectomy    . Tonsillectomy    . Vaginal hysterectomy  12/29/2010    Procedure: HYSTERECTOMY VAGINAL;  Surgeon: Daria Pastures;  Location: [facility];  Service: Gynecology;  Laterality: N/A;  . Bladder suspension  12/29/2010    Procedure: TRANSVAGINAL TAPE (TVT) PROCEDURE;  Surgeon: Daria Pastures;  Location: [facility];  Service: Gynecology;  Laterality: N/A;  . Cystocele repair  12/29/2010    Procedure: ANTERIOR REPAIR (CYSTOCELE);  Surgeon: Daria Pastures;  Location: [facility];  Service: Gynecology;  Laterality: N/A;  . Cystoscopy  12/29/2010    Procedure: CYSTOSCOPY;  Surgeon: Daria Pastures;  Location: [facility];  Service: Gynecology;  Laterality: N/A;  . Replacement total knee Right 2011  . Breast surgery      left breast lumpectomy 1977  . Total knee arthroplasty Left 11/17/2014    Procedure: LEFT TOTAL KNEE ARTHROPLASTY;  Surgeon: Gaynelle Arabian, MD;  Location: [facility];  Service: Orthopedics;  Laterality: Left;    There were no vitals filed for this visit.  Visit Diagnosis:  Knee stiffness, left  Difficulty walking  Left knee pain  Edema  Left leg weakness  Proximal muscle weakness      Subjective Assessment - 12/15/14 1015    Subjective Pt had a very busy weekend, events with husband and church. She rports she had some additional pain, which was well managed through RICE and oxycodone.    Pertinent History Patient had a L knee replacement done on August 29th by Dr. Wynelle Link; patient did get HHPT and has been discharged from thier services. Not currently using an assistive device.    Patient Stated Goals be able to bend knee like MD wants, get rid of tightness    Currently in Pain? Yes   Pain Score 5    Pain Location Knee   Pain Orientation Left                         [facility] Adult PT Treatment/Exercise - 12/15/14 0001    Knee/Hip Exercises: Stretches   Active Hamstring Stretch Both;3 reps;30 seconds   Active Hamstring Stretch Limitations 14 inch box    Knee: Self-Stretch to increase Flexion Left;10 seconds   Knee:  Self-Stretch Limitations 10 reps  forward lunge 14" box   Gastroc Stretch Both;3 reps;30 seconds   Gastroc Stretch Limitations slantboard    Knee/Hip Exercises: Aerobic   Stationary Bike 10' at seat 9   Knee/Hip Exercises: Standing   SLS 3x30 sec bilat  occasional need for BUE support   Knee/Hip Exercises: Seated   Long Arc Quad Other (comment)  *See supine exercise   Sit to Sand 2 sets;10 reps;without UE support  wide and normal stance   Knee/Hip Exercises: Supine   Short Arc Quad Sets Left;Strengthening;2 sets;15 reps  2# around foot   Patellar Mobs 3x30 sec med/lat; 3x30sec cranial/caudal   Other Supine Knee/Hip Exercises LLE Supine Marching:   2x15 hip/knee flexion   Other Supine Knee/Hip Exercises Supine Long Arc Quads  2x15: hip at 90 degrees flexion                 PT Education - 12/15/14 1042    Education provided Yes   Education Details Continue to progress activitiy level at home to explore tolerance, and follow up with current measures for pain and sweeling management.   Person(s) Educated Patient   Methods Explanation   Comprehension Verbalized understanding          PT Short Term Goals - 12/09/14 1158    PT SHORT TERM GOAL #1   Title Patient will demonstrate L knee extension no more than 5 degrees and L knee flexion no less than 110 degrees    Time 2   Period Weeks   Status New   PT SHORT TERM GOAL #2   Title Patient will demonstrate improved gait mechanics including equal step lenghts, full L LE TKE, improved proximal muscle strength, and correct posture thorughout gait mechancis    Time 2   Period Weeks   Status New   PT SHORT TERM GOAL #3   Title Patient will be able to perform functional activities for at least 2 hours with no increase in pain or edema L knee, no assistive device    Time 2   Period Weeks   Status New   PT SHORT TERM GOAL #4   Title Patient will be independent in correctly and consistently performing appropriate advanced HEP, to be progressed PRN    Time 2   Period Weeks   Status New           PT Long Term Goals - 12/10/14 1914    PT LONG TERM GOAL #1   Title Patient will demonstrate L knee extension of 0 degrees and L knee flexion of at least 120 degrees    Time 4   Period Weeks   Status New   PT LONG TERM GOAL #2   Title Patient will be unlimited in her tolerance of functional activities performed in standing with no increase in pain and equal weight bearing each LE    Time 4   Period Weeks   Status New   PT LONG TERM GOAL #3   Title Patient will be able to reciprocally ascend/descend stairs with no railings, no circumduction, no increase pain, minimal unsteadiness    Time 4   Period Weeks   Status New   PT LONG TERM GOAL #4   Title Patient will report that she has been  able to perform exercise of light-moderate activity for at least 30 minutes, at least 3 times per week, for improbed health benefits and maintenance of functional gains    Time 4  Period Weeks   Status New   PT LONG TERM GOAL #5   Title Patient will demonstrate at least 4+/5 strength in all tested muscles in bilateral lower extremities and in proximal region    Time 4   Period Weeks   Status New               Plan - 12/15/14 1044    Clinical Impression Statement Pt continues to demonstrate progress toward goals evident in imporved strength, AROM, and self management of pain and swelling at home. Pt continues to be most limited by poor activity tolerance and limited functional strength altering gait parameteres. Will contiue with POC as previously indicated.   Rehab Potential Excellent   PT Frequency 3x / week   PT Duration 4 weeks   PT Treatment/Interventions ADLs/Self Care Home Management;Cryotherapy;Gait training;Stair training;Functional mobility training;Therapeutic activities;Therapeutic exercise;Balance training;Neuromuscular re-education;Patient/family education;Manual techniques;Scar mobilization   PT Next Visit Plan Continue with standing strengthening, reintroduce manual therapy to improve ROM   PT Home Exercise Plan patient currrently doing HHPT HEP, to be updated PRN    Consulted and Agree with Plan of Care Patient        Problem List Patient Active Problem List   Diagnosis Date Noted  . OA (osteoarthritis) of knee 11/17/2014  . HTN (hypertension) 05/28/2014  . Obstructive sleep apnea 05/28/2014  . Anemia, iron deficiency 12/16/2013  . Prediabetes 05/01/2013  . GERD (gastroesophageal reflux disease) 02/06/2013  . Hypothyroidism 12/12/2012  . Insomnia 12/12/2012  . Hyperlipidemia 12/12/2012  . Osteoarthritis 12/12/2012  . Restless legs syndrome 12/12/2012    Buccola,Allan C 12/15/2014, 11:00 AM 11:00 AM  Etta Grandchild, PT, DPT [place] License #  [id number]      [facility] [address] [place], [place], [address] Phone: [phone]   Fax:  [phone]

## 2014-12-17 ENCOUNTER — Ambulatory Visit (HOSPITAL_COMMUNITY): Payer: Federal, State, Local not specified - PPO | Admitting: Physical Therapy

## 2014-12-17 DIAGNOSIS — Z96652 Presence of left artificial knee joint: Secondary | ICD-10-CM | POA: Diagnosis not present

## 2014-12-17 DIAGNOSIS — M25562 Pain in left knee: Secondary | ICD-10-CM

## 2014-12-17 DIAGNOSIS — R29898 Other symptoms and signs involving the musculoskeletal system: Secondary | ICD-10-CM

## 2014-12-17 DIAGNOSIS — M25662 Stiffness of left knee, not elsewhere classified: Secondary | ICD-10-CM

## 2014-12-17 DIAGNOSIS — R609 Edema, unspecified: Secondary | ICD-10-CM

## 2014-12-17 DIAGNOSIS — R262 Difficulty in walking, not elsewhere classified: Secondary | ICD-10-CM

## 2014-12-17 NOTE — Therapy (Signed)
[place], [place], [address] Phone: [phone]   Fax:  [phone]  Physical Therapy Treatment  Patient Details  Name: Karen Vazquez MRN: [id number] Date of Birth: [birth year] Referring Miela Desjardin:  Gaynelle Arabian, MD  Encounter Date: 12/17/2014      PT End of Session - 12/17/14 0853    Visit Number 5   Number of Visits 12   Date for PT Re-Evaluation 01/06/15   Authorization Type [employer] Employee    Authorization Time Period 12/09/14 to 02/08/15   PT Start Time 0801   PT Stop Time 0854   PT Time Calculation (min) 53 min   Activity Tolerance Patient tolerated treatment well   Behavior During Therapy [facility] for tasks assessed/performed      Past Medical History  Diagnosis Date  . Hypothyroidism   . Blood transfusion 2011    at [facility]  . Arthritis   . Restless leg syndrome   . Hypertension   . Sleep apnea     does not use c-pap machine    Past Surgical History  Procedure Laterality Date  . Joint replacement  2011    rt knee  . Tubal ligation    . Appendectomy    . Tonsillectomy    . Vaginal hysterectomy  12/29/2010    Procedure: HYSTERECTOMY VAGINAL;  Surgeon: Daria Pastures;  Location: [facility];  Service: Gynecology;  Laterality: N/A;  . Bladder suspension  12/29/2010    Procedure: TRANSVAGINAL TAPE (TVT) PROCEDURE;  Surgeon: Daria Pastures;  Location: [facility];  Service: Gynecology;  Laterality: N/A;  . Cystocele repair  12/29/2010    Procedure: ANTERIOR REPAIR (CYSTOCELE);  Surgeon: Daria Pastures;  Location: [facility];  Service: Gynecology;  Laterality: N/A;  . Cystoscopy  12/29/2010    Procedure: CYSTOSCOPY;  Surgeon: Daria Pastures;  Location: [facility];  Service: Gynecology;  Laterality: N/A;  . Replacement total knee Right 2011  . Breast surgery      left breast lumpectomy 1977  . Total knee arthroplasty Left 11/17/2014    Procedure: LEFT TOTAL KNEE ARTHROPLASTY;  Surgeon: Gaynelle Arabian, MD;  Location: [facility];  Service: Orthopedics;  Laterality: Left;    There were no vitals filed for this visit.  Visit Diagnosis:  Knee stiffness, left  Difficulty walking  Left knee pain  Edema  Left leg weakness      Subjective Assessment - 12/17/14 0804    Subjective Pt reports that she was pretty sore after last treatment, but she feels good today.    Currently in Pain? Yes   Pain Score 3    Pain Location Knee   Pain Orientation Left                         [facility] Adult PT Treatment/Exercise - 12/17/14 0001    Knee/Hip Exercises: Stretches   Active Hamstring Stretch Both;3 reps;30 seconds   Active Hamstring Stretch Limitations 14 inch box    Knee: Self-Stretch to increase Flexion Left;10 seconds   Knee: Self-Stretch Limitations 10 reps  forward lunge 12" box   Gastroc Stretch Both;3 reps;30 seconds   Gastroc Stretch Limitations slantboard    Knee/Hip Exercises: Aerobic   Stationary Bike 10' at seat 9   Knee/Hip Exercises: Standing   Forward Lunges 15 reps   Forward Lunges Limitations 6" step   Side Lunges 10 reps   Side Lunges Limitations 6" step  Terminal Knee Extension Limitations x15 with green tband   Lateral Step Up 10 reps;Step Height: 4"   Forward Step Up 10 reps;Step Height: 4"   Rocker Board 2 minutes   SLS with Vectors 5x5" holds, one UE support   Knee/Hip Exercises: Supine   Patellar Mobs 3x30 sec med/lat; 3x30sec cranial/caudal   Manual Therapy   Manual Therapy Soft tissue mobilization;Myofascial release   Manual therapy comments Lt knee   Soft tissue mobilization scar mobilization   Myofascial Release to surrounding tissue                  PT Short Term Goals - 12/09/14 1158    PT SHORT TERM GOAL #1   Title Patient will demonstrate L knee extension no more than 5 degrees and L knee flexion no less than 110 degrees    Time 2   Period Weeks   Status New   PT SHORT TERM GOAL #2   Title Patient will  demonstrate improved gait mechanics including equal step lenghts, full L LE TKE, improved proximal muscle strength, and correct posture thorughout gait mechancis    Time 2   Period Weeks   Status New   PT SHORT TERM GOAL #3   Title Patient will be able to perform functional activities for at least 2 hours with no increase in pain or edema L knee, no assistive device    Time 2   Period Weeks   Status New   PT SHORT TERM GOAL #4   Title Patient will be independent in correctly and consistently performing appropriate advanced HEP, to be progressed PRN    Time 2   Period Weeks   Status New           PT Long Term Goals - 12/10/14 9937    PT LONG TERM GOAL #1   Title Patient will demonstrate L knee extension of 0 degrees and L knee flexion of at least 120 degrees    Time 4   Period Weeks   Status New   PT LONG TERM GOAL #2   Title Patient will be unlimited in her tolerance of functional activities performed in standing with no increase in pain and equal weight bearing each LE    Time 4   Period Weeks   Status New   PT LONG TERM GOAL #3   Title Patient will be able to reciprocally ascend/descend stairs with no railings, no circumduction, no increase pain, minimal unsteadiness    Time 4   Period Weeks   Status New   PT LONG TERM GOAL #4   Title Patient will report that she has been able to perform exercise of light-moderate activity for at least 30 minutes, at least 3 times per week, for improbed health benefits and maintenance of functional gains    Time 4   Period Weeks   Status New   PT LONG TERM GOAL #5   Title Patient will demonstrate at least 4+/5 strength in all tested muscles in bilateral lower extremities and in proximal region    Time 4   Period Weeks   Status New               Plan - 12/17/14 1696    Clinical Impression Statement Treatment session focused on functional strengthening and ROM. Added TKE to POC today, pt was able to complete with good form and  no c/o pain. Pt reported that her knee felt looser following manual therapy, stating that  it was easier to walk and bend her knee following patellar mobs and scar tissue mobilization.    PT Treatment/Interventions ADLs/Self Care Home Management;Cryotherapy;Gait training;Stair training;Functional mobility training;Therapeutic activities;Therapeutic exercise;Balance training;Neuromuscular re-education;Patient/family education;Manual techniques;Scar mobilization   PT Next Visit Plan Continue with functional strengthening, manual therapy to scar tissue        Problem List Patient Active Problem List   Diagnosis Date Noted  . OA (osteoarthritis) of knee 11/17/2014  . HTN (hypertension) 05/28/2014  . Obstructive sleep apnea 05/28/2014  . Anemia, iron deficiency 12/16/2013  . Prediabetes 05/01/2013  . GERD (gastroesophageal reflux disease) 02/06/2013  . Hypothyroidism 12/12/2012  . Insomnia 12/12/2012  . Hyperlipidemia 12/12/2012  . Osteoarthritis 12/12/2012  . Restless legs syndrome 12/12/2012    Hilma Favors, PT, DPT [phone] 12/17/2014, 9:02 AM  [place], [place], [address] Phone: [phone]   Fax:  [phone]

## 2014-12-19 ENCOUNTER — Ambulatory Visit (HOSPITAL_COMMUNITY): Payer: Federal, State, Local not specified - PPO

## 2014-12-19 DIAGNOSIS — R609 Edema, unspecified: Secondary | ICD-10-CM

## 2014-12-19 DIAGNOSIS — Z96652 Presence of left artificial knee joint: Secondary | ICD-10-CM | POA: Diagnosis not present

## 2014-12-19 DIAGNOSIS — M6281 Muscle weakness (generalized): Secondary | ICD-10-CM

## 2014-12-19 DIAGNOSIS — M25662 Stiffness of left knee, not elsewhere classified: Secondary | ICD-10-CM

## 2014-12-19 DIAGNOSIS — R262 Difficulty in walking, not elsewhere classified: Secondary | ICD-10-CM

## 2014-12-19 DIAGNOSIS — R29898 Other symptoms and signs involving the musculoskeletal system: Secondary | ICD-10-CM

## 2014-12-19 DIAGNOSIS — M25562 Pain in left knee: Secondary | ICD-10-CM

## 2014-12-19 NOTE — Therapy (Signed)
[place], [place], [address] Phone: [phone]   Fax:  [phone]  Physical Therapy Treatment  Patient Details  Name: Karen Vazquez MRN: [id number] Date of Birth: [birth year] Referring Provider:  Gaynelle Arabian, MD  Encounter Date: 12/19/2014      PT End of Session - 12/19/14 0806    Visit Number 6   Number of Visits 12   Date for PT Re-Evaluation 01/06/15   Authorization Type [employer] Federal Employee    Authorization Time Period 12/09/14 to 02/08/15   PT Start Time 0800   PT Stop Time 0858   PT Time Calculation (min) 58 min   Activity Tolerance Patient tolerated treatment well   Behavior During Therapy [facility] for tasks assessed/performed      Past Medical History  Diagnosis Date  . Hypothyroidism   . Blood transfusion 2011    at [facility]  . Arthritis   . Restless leg syndrome   . Hypertension   . Sleep apnea     does not use c-pap machine    Past Surgical History  Procedure Laterality Date  . Joint replacement  2011    rt knee  . Tubal ligation    . Appendectomy    . Tonsillectomy    . Vaginal hysterectomy  12/29/2010    Procedure: HYSTERECTOMY VAGINAL;  Surgeon: Daria Pastures;  Location: [facility];  Service: Gynecology;  Laterality: N/A;  . Bladder suspension  12/29/2010    Procedure: TRANSVAGINAL TAPE (TVT) PROCEDURE;  Surgeon: Daria Pastures;  Location: [facility];  Service: Gynecology;  Laterality: N/A;  . Cystocele repair  12/29/2010    Procedure: ANTERIOR REPAIR (CYSTOCELE);  Surgeon: Daria Pastures;  Location: [facility];  Service: Gynecology;  Laterality: N/A;  . Cystoscopy  12/29/2010    Procedure: CYSTOSCOPY;  Surgeon: Daria Pastures;  Location: [facility];  Service: Gynecology;  Laterality: N/A;  . Replacement total knee Right 2011  . Breast surgery      left breast lumpectomy 1977  . Total knee arthroplasty Left 11/17/2014    Procedure: LEFT TOTAL KNEE ARTHROPLASTY;  Surgeon: Gaynelle Arabian, MD;  Location: [facility];  Service: Orthopedics;  Laterality: Left;    There were no vitals filed for this visit.  Visit Diagnosis:  Knee stiffness, left  Difficulty walking  Left knee pain  Edema  Left leg weakness  Proximal muscle weakness  Status post total left knee replacement      Subjective Assessment - 12/19/14 0804    Subjective Pt stated knee is feeling good just a little tight, reports she feels good following manual therapy.   Currently in Pain? No/denies            [facility] PT Assessment - 12/19/14 0001    Assessment   Medical Diagnosis L total knee    Onset Date/Surgical Date 11/17/14   Next MD Visit October 4th with Dr. Wynelle Link    AROM   Left Knee Extension 3   Left Knee Flexion 111              [facility] Adult PT Treatment/Exercise - 12/19/14 0001    Exercises   Exercises Knee/Hip   Knee/Hip Exercises: Stretches   Active Hamstring Stretch Both;3 reps;30 seconds   Active Hamstring Stretch Limitations 14 inch box    Knee: Self-Stretch to increase Flexion Left;10 seconds   Knee: Self-Stretch Limitations 10 reps  knee drives on 26ST step   Press photographer Both;3  reps;30 seconds   Gastroc Stretch Limitations slantboard    Knee/Hip Exercises: Aerobic   Stationary Bike 10' at seat 8   Knee/Hip Exercises: Standing   Forward Lunges 15 reps   Forward Lunges Limitations 6" step   Side Lunges 10 reps   Side Lunges Limitations 6" step   Terminal Knee Extension Left;15 reps   Theraband Level (Terminal Knee Extension) Level 3 (Green)   Lateral Step Up 15 reps;Hand Hold: 1;Step Height: 4"   Forward Step Up 15 reps;Hand Hold: 1;Step Height: 6"   Functional Squat 10 reps   Manual Therapy   Manual Therapy Soft tissue mobilization;Myofascial release   Manual therapy comments Lt knee   Soft tissue mobilization scar mobilization   Myofascial Release to surrounding tissue             PT Short Term Goals - 12/09/14 1158    PT SHORT TERM GOAL  #1   Title Patient will demonstrate L knee extension no more than 5 degrees and L knee flexion no less than 110 degrees    Time 2   Period Weeks   Status New   PT SHORT TERM GOAL #2   Title Patient will demonstrate improved gait mechanics including equal step lenghts, full L LE TKE, improved proximal muscle strength, and correct posture thorughout gait mechancis    Time 2   Period Weeks   Status New   PT SHORT TERM GOAL #3   Title Patient will be able to perform functional activities for at least 2 hours with no increase in pain or edema L knee, no assistive device    Time 2   Period Weeks   Status New   PT SHORT TERM GOAL #4   Title Patient will be independent in correctly and consistently performing appropriate advanced HEP, to be progressed PRN    Time 2   Period Weeks   Status New           PT Long Term Goals - 12/10/14 1829    PT LONG TERM GOAL #1   Title Patient will demonstrate L knee extension of 0 degrees and L knee flexion of at least 120 degrees    Time 4   Period Weeks   Status New   PT LONG TERM GOAL #2   Title Patient will be unlimited in her tolerance of functional activities performed in standing with no increase in pain and equal weight bearing each LE    Time 4   Period Weeks   Status New   PT LONG TERM GOAL #3   Title Patient will be able to reciprocally ascend/descend stairs with no railings, no circumduction, no increase pain, minimal unsteadiness    Time 4   Period Weeks   Status New   PT LONG TERM GOAL #4   Title Patient will report that she has been able to perform exercise of light-moderate activity for at least 30 minutes, at least 3 times per week, for improbed health benefits and maintenance of functional gains    Time 4   Period Weeks   Status New   PT LONG TERM GOAL #5   Title Patient will demonstrate at least 4+/5 strength in all tested muscles in bilateral lower extremities and in proximal region    Time 4   Period Weeks   Status New                Plan - 12/19/14 0807    Clinical Impression Statement  Session focus on improve ROM and functional strengthening.  Pt able to complete all exercises with proper form and no reports of pain.  Functional strengthening and ROM improving, able to increase step height with step up training with viisual musculature fatigue, no reports of pain.  Improved flexibility following manual with AROM 3-111 degrees at end of session.  Noted improved gait mechanics    PT Next Visit Plan Complete progress notes prior MD apt next session.  Continue with functional strengthening, manual therapy to scar tissue        Problem List Patient Active Problem List   Diagnosis Date Noted  . OA (osteoarthritis) of knee 11/17/2014  . HTN (hypertension) 05/28/2014  . Obstructive sleep apnea 05/28/2014  . Anemia, iron deficiency 12/16/2013  . Prediabetes 05/01/2013  . GERD (gastroesophageal reflux disease) 02/06/2013  . Hypothyroidism 12/12/2012  . Insomnia 12/12/2012  . Hyperlipidemia 12/12/2012  . Osteoarthritis 12/12/2012  . Restless legs syndrome 12/12/2012   Ihor Austin, LPTA; [place]  Aldona Lento 12/19/2014, 9:44 AM  [place], [place], [address] Phone: [phone]   Fax:  [phone]

## 2014-12-22 ENCOUNTER — Ambulatory Visit (HOSPITAL_COMMUNITY): Payer: Federal, State, Local not specified - PPO | Attending: Orthopedic Surgery | Admitting: Physical Therapy

## 2014-12-22 DIAGNOSIS — R262 Difficulty in walking, not elsewhere classified: Secondary | ICD-10-CM | POA: Diagnosis present

## 2014-12-22 DIAGNOSIS — Z96652 Presence of left artificial knee joint: Secondary | ICD-10-CM | POA: Diagnosis present

## 2014-12-22 DIAGNOSIS — M25562 Pain in left knee: Secondary | ICD-10-CM | POA: Diagnosis present

## 2014-12-22 DIAGNOSIS — M25662 Stiffness of left knee, not elsewhere classified: Secondary | ICD-10-CM

## 2014-12-22 DIAGNOSIS — R29898 Other symptoms and signs involving the musculoskeletal system: Secondary | ICD-10-CM | POA: Insufficient documentation

## 2014-12-22 DIAGNOSIS — M6289 Other specified disorders of muscle: Secondary | ICD-10-CM | POA: Insufficient documentation

## 2014-12-22 DIAGNOSIS — M6281 Muscle weakness (generalized): Secondary | ICD-10-CM

## 2014-12-22 NOTE — Therapy (Signed)
[place], [place], [address] Phone: [phone]   Fax:  [phone]  Physical Therapy Treatment (Re-Assessment)  Patient Details  Name: Karen Vazquez MRN: [id number] Date of Birth: Dec 14, 1955 Referring Provider:  Gaynelle Arabian, MD  Encounter Date: 12/22/2014      PT End of Session - 12/22/14 1012    Visit Number 7   Number of Visits 12   Date for PT Re-Evaluation 01/06/15   Authorization Type [employer] Federal Employee    Authorization Time Period 12/09/14 to 02/08/15   PT Start Time 0847   PT Stop Time 0932   PT Time Calculation (min) 45 min   Activity Tolerance Patient tolerated treatment well   Behavior During Therapy [facility] for tasks assessed/performed      Past Medical History  Diagnosis Date  . Hypothyroidism   . Blood transfusion 2011    at [facility]  . Arthritis   . Restless leg syndrome   . Hypertension   . Sleep apnea     does not use c-pap machine    Past Surgical History  Procedure Laterality Date  . Joint replacement  2011    rt knee  . Tubal ligation    . Appendectomy    . Tonsillectomy    . Vaginal hysterectomy  12/29/2010    Procedure: HYSTERECTOMY VAGINAL;  Surgeon: Daria Pastures;  Location: [facility];  Service: Gynecology;  Laterality: N/A;  . Bladder suspension  12/29/2010    Procedure: TRANSVAGINAL TAPE (TVT) PROCEDURE;  Surgeon: Daria Pastures;  Location: [facility];  Service: Gynecology;  Laterality: N/A;  . Cystocele repair  12/29/2010    Procedure: ANTERIOR REPAIR (CYSTOCELE);  Surgeon: Daria Pastures;  Location: [facility];  Service: Gynecology;  Laterality: N/A;  . Cystoscopy  12/29/2010    Procedure: CYSTOSCOPY;  Surgeon: Daria Pastures;  Location: [facility];  Service: Gynecology;  Laterality: N/A;  . Replacement total knee Right 2011  . Breast surgery      left breast lumpectomy 1977  . Total knee arthroplasty Left 11/17/2014    Procedure: LEFT TOTAL KNEE ARTHROPLASTY;   Surgeon: Gaynelle Arabian, MD;  Location: [facility];  Service: Orthopedics;  Laterality: Left;    There were no vitals filed for this visit.  Visit Diagnosis:  Knee stiffness, left  Difficulty walking  Left knee pain  Left leg weakness  Proximal muscle weakness  Status post total left knee replacement      Subjective Assessment - 12/22/14 1005    Subjective Pt states she returns to MD tomorrow.  STates she is overall doing well with most discomfort/tightness felt in medial knee but not really hurting.  Pt reports concern over irritated area superior scar where stitch was removed by nurse last visit.    Currently in Pain? No/denies            [facility] PT Assessment - 12/22/14 0001    Assessment   Medical Diagnosis L total knee    Onset Date/Surgical Date 11/17/14   Next MD Visit October 4th with Dr. Wynelle Link    AROM   Left Knee Extension 0  was lacking 10 degrees from neutral   Left Knee Flexion 115  was 104 degrees                     [facility] Adult PT Treatment/Exercise - 12/22/14 0900    Knee/Hip Exercises: Stretches   Active Hamstring Stretch Both;3 reps;30 seconds  Active Hamstring Stretch Limitations 14 inch box    Knee: Self-Stretch to increase Flexion Left;10 seconds   Knee: Self-Stretch Limitations 10 reps   Gastroc Stretch Both;3 reps;30 seconds   Gastroc Stretch Limitations slantboard    Knee/Hip Exercises: Standing   Forward Lunges 15 reps   Forward Lunges Limitations 4" step   Side Lunges 15 reps   Side Lunges Limitations 4" step   Lateral Step Up 15 reps;Hand Hold: 1;Step Height: 4"   Stairs 2RT 4" reciprocally no HR   Manual Therapy   Manual Therapy Myofascial release   Manual therapy comments Lt knee   Myofascial Release medial knee to decrease adhesions                  PT Short Term Goals - 12/22/14 1008    PT SHORT TERM GOAL #1   Title Patient will demonstrate L knee extension no more than 5 degrees and L knee flexion no  less than 110 degrees    Time 2   Period Weeks   Status Achieved   PT SHORT TERM GOAL #2   Title Patient will demonstrate improved gait mechanics including equal step lenghts, full L LE TKE, improved proximal muscle strength, and correct posture thorughout gait mechancis    Time 2   Period Weeks   Status Achieved   PT SHORT TERM GOAL #3   Title Patient will be able to perform functional activities for at least 2 hours with no increase in pain or edema L knee, no assistive device    Time 2   Period Weeks   Status Achieved   PT SHORT TERM GOAL #4   Title Patient will be independent in correctly and consistently performing appropriate advanced HEP, to be progressed PRN    Time 2   Period Weeks   Status On-going           PT Long Term Goals - 12/22/14 1011    PT LONG TERM GOAL #1   Title Patient will demonstrate L knee extension of 0 degrees and L knee flexion of at least 120 degrees    Time 4   Period Weeks   Status Partially Met   PT LONG TERM GOAL #2   Title Patient will be unlimited in her tolerance of functional activities performed in standing with no increase in pain and equal weight bearing each LE    Time 4   Period Weeks   Status On-going   PT LONG TERM GOAL #3   Title Patient will be able to reciprocally ascend/descend stairs with no railings, no circumduction, no increase pain, minimal unsteadiness    Time 4   Period Weeks   Status Achieved   PT LONG TERM GOAL #4   Title Patient will report that she has been able to perform exercise of light-moderate activity for at least 30 minutes, at least 3 times per week, for improbed health benefits and maintenance of functional gains    Time 4   Period Weeks   Status On-going   PT LONG TERM GOAL #5   Title Patient will demonstrate at least 4+/5 strength in all tested muscles in bilateral lower extremities and in proximal region    Time 4   Period Weeks   Status On-going               Plan - 12/22/14 1012     Clinical Impression Statement Pt with pimply area superior to scar but without signs/symptoms of  infection.  PT instructed to show this to MD.  Pt overall improving with increased ROM 0-115 degrees.  Pt has met 3/4 STG's and is progressing well towards LTG's.  Pt is completing her HEP independently.  Pt with 5 visits remaining to work toward remaining unmet goals.    PT Next Visit Plan Continue with functional strengthening, manual therapy to scar tissue X 5 more sessions.         Problem List Patient Active Problem List   Diagnosis Date Noted  . OA (osteoarthritis) of knee 11/17/2014  . HTN (hypertension) 05/28/2014  . Obstructive sleep apnea 05/28/2014  . Anemia, iron deficiency 12/16/2013  . Prediabetes 05/01/2013  . GERD (gastroesophageal reflux disease) 02/06/2013  . Hypothyroidism 12/12/2012  . Insomnia 12/12/2012  . Hyperlipidemia 12/12/2012  . Osteoarthritis 12/12/2012  . Restless legs syndrome 12/12/2012    Teena Irani, PTA/CLT [phone]  12/22/2014, 10:20 AM   Physical Therapy Progress Note  Dates of Reporting Period: 12/09/14 to 12/22/14  Objective Reports of Subjective Statement: see above   Objective Measurements: see above   Goal Update: see above   Plan: see above   Reason Skilled Services are Required: continue to work on gait, strength, balance, continue to progress towards [place] PT, DPT [facility] [address] [place], [place], [address] Phone: [phone]   Fax:  [phone]

## 2014-12-24 ENCOUNTER — Encounter (HOSPITAL_COMMUNITY): Payer: Federal, State, Local not specified - PPO

## 2014-12-26 ENCOUNTER — Encounter (HOSPITAL_COMMUNITY): Payer: Federal, State, Local not specified - PPO | Admitting: Physical Therapy

## 2014-12-29 ENCOUNTER — Encounter (HOSPITAL_COMMUNITY): Payer: Federal, State, Local not specified - PPO | Admitting: Physical Therapy

## 2014-12-31 ENCOUNTER — Encounter (HOSPITAL_COMMUNITY): Payer: Federal, State, Local not specified - PPO

## 2015-01-02 ENCOUNTER — Encounter (HOSPITAL_COMMUNITY): Payer: Federal, State, Local not specified - PPO

## 2015-01-05 ENCOUNTER — Encounter (HOSPITAL_COMMUNITY): Payer: Federal, State, Local not specified - PPO | Admitting: Physical Therapy

## 2015-01-07 ENCOUNTER — Encounter (HOSPITAL_COMMUNITY): Payer: Federal, State, Local not specified - PPO

## 2015-01-09 ENCOUNTER — Encounter (HOSPITAL_COMMUNITY): Payer: Federal, State, Local not specified - PPO | Admitting: Physical Therapy

## 2015-01-12 NOTE — Therapy (Signed)
[facility] [address] [place], [place], [address] Phone: [phone]   Fax:  [phone]  Patient Details  Name: Karen Vazquez MRN: [id number] Date of Birth: 01/23/1956 Referring Provider:  Aluisio, Frank, MD  Encounter Date: 01/12/2015  PHYSICAL THERAPY DISCHARGE SUMMARY  Visits from Start of Care: 7  Current functional level related to goals / functional outcomes: Patient cancelled all further appointments after seeing MD, who told her she was doing very well and did not need to return to PT    Remaining deficits: Eccentric control, balance    Education / Equipment: None  Plan: Patient agrees to discharge.  Patient goals were partially met. Patient is being discharged due to being pleased with the current functional level.  ?????       Kristen Unger PT, DPT [phone]   [facility] [address] [place], [place], [address] Phone: [phone]   Fax:  [phone] 

## 2015-01-20 ENCOUNTER — Encounter (HOSPITAL_COMMUNITY): Payer: Self-pay

## 2015-01-27 ENCOUNTER — Other Ambulatory Visit: Payer: Self-pay | Admitting: *Deleted

## 2015-01-27 MED ORDER — HYDROCHLOROTHIAZIDE 25 MG PO TABS: 25.0000 mg | ORAL_TABLET | Freq: Every day | ORAL | Status: DC

## 2015-01-27 MED ORDER — ROPINIROLE HCL 2 MG PO TABS
ORAL_TABLET | ORAL | Status: DC
Start: 2015-01-27 — End: 2015-03-04

## 2015-02-10 ENCOUNTER — Encounter: Payer: Self-pay | Admitting: Family Medicine

## 2015-02-10 ENCOUNTER — Ambulatory Visit (INDEPENDENT_AMBULATORY_CARE_PROVIDER_SITE_OTHER): Payer: Federal, State, Local not specified - PPO | Admitting: Family Medicine

## 2015-02-10 VITALS — BP 134/86 | Ht 64.0 in | Wt 231.4 lb

## 2015-02-10 DIAGNOSIS — G4700 Insomnia, unspecified: Secondary | ICD-10-CM

## 2015-02-10 DIAGNOSIS — I10 Essential (primary) hypertension: Secondary | ICD-10-CM

## 2015-02-10 DIAGNOSIS — G2581 Restless legs syndrome: Secondary | ICD-10-CM | POA: Diagnosis not present

## 2015-02-10 DIAGNOSIS — E038 Other specified hypothyroidism: Secondary | ICD-10-CM | POA: Diagnosis not present

## 2015-02-10 MED ORDER — HYDROCHLOROTHIAZIDE 25 MG PO TABS: 25.0000 mg | ORAL_TABLET | Freq: Every day | ORAL | Status: DC

## 2015-02-10 MED ORDER — POTASSIUM CHLORIDE CRYS ER 20 MEQ PO TBCR: 20.0000 meq | EXTENDED_RELEASE_TABLET | Freq: Every day | ORAL | Status: DC

## 2015-02-10 MED ORDER — ZOLPIDEM TARTRATE 10 MG PO TABS: 10.0000 mg | ORAL_TABLET | Freq: Every evening | ORAL | Status: DC | PRN

## 2015-02-10 NOTE — Progress Notes (Signed)
   Subjective:    Patient ID: Karen Vazquez, female    DOB: 12/25/1955, 59 y.o.   MRN: [id number]  Hypertension This is a chronic problem. The current episode started more than 1 year ago. Pertinent negatives include no chest pain or shortness of breath. There are no compliance problems.    reflux under good control without taking medicines Blood pressure medicine seems to be working well no swelling issues  thyroid doing well with medication energy level doing okay  not sleeping well significant insomnia issues 5 mg doesn't work well enough she would like to try to 10 mg   restless legs doing good with medication continue current medicine Patient states no concerns this visit.   Review of Systems  Constitutional: Negative for fever and activity change.  HENT: Positive for congestion and rhinorrhea. Negative for ear pain.   Eyes: Negative for discharge.  Respiratory: Positive for cough. Negative for shortness of breath and wheezing.   Cardiovascular: Negative for chest pain.       Objective:   Physical Exam  Constitutional: She appears well-developed.  HENT:  Head: Normocephalic.  Nose: Nose normal.  Mouth/Throat: Oropharynx is clear and moist. No oropharyngeal exudate.  Neck: Neck supple.  Cardiovascular: Normal rate and normal heart sounds.   No murmur heard. Pulmonary/Chest: Effort normal and breath sounds normal. She has no wheezes.  Lymphadenopathy:    She has no cervical adenopathy.  Skin: Skin is warm and dry.  Nursing note and vitals reviewed.    25 minutes was spent with the patient. Greater than half the time was spent in discussion and answering questions and counseling regarding the issues that the patient came in for today.      Assessment & Plan:  1. Essential hypertension  blood pressure good control continue current measures  2. Other specified hypothyroidism  needs refills on thyroid medicine does well with this will need lab work on a yearly basis  in the spring will need comprehensive lab work any medication seems to be doing well for her  3. Insomnia  Ambien did not work well enough she would like to go up on the dose she was warned when she takes medicine no driving for at least 8 hours if it causes excessive drowsiness she is let us now 5 mg would make her sleep just a few hours and she would still wake up  4. Restless legs syndrome  seems to be doing good with medication continue current medications.   Patient is gotten over her knee replacement.  she also states her reflux under good control without medicine

## 2015-02-11 ENCOUNTER — Ambulatory Visit: Payer: Federal, State, Local not specified - PPO | Admitting: Family Medicine

## 2015-02-27 ENCOUNTER — Telehealth: Payer: Self-pay | Admitting: Family Medicine

## 2015-02-27 NOTE — Telephone Encounter (Signed)
Rx prior auth for pt's zolpidem (AMBIEN) 10 MG tablet, valid 12/26/14-02/24/16 through [place], they notified pt

## 2015-03-03 ENCOUNTER — Telehealth: Payer: Self-pay | Admitting: Family Medicine

## 2015-03-03 NOTE — Telephone Encounter (Signed)
rOPINIRole (REQUIP) 2 MG tablet  Pt states she is having a hard time taking this med BID, it is making her  Sick on her stomach. Would like to go back down to 1 daily if you agree.  Please advise, if we do change dosage it will need to be changed with  [facility] as well

## 2015-03-03 NOTE — Telephone Encounter (Signed)
Nurse's-please clarify with patient-I assume this is Requip 2 mg and she is requesting to use it just once daily instead of twice daily? Please discuss with patient. If that be the case it is fine to reduce it down. Please make sure Epic reflects how she is taking it.

## 2015-03-04 MED ORDER — ROPINIROLE HCL 2 MG PO TABS: ORAL_TABLET | ORAL | Status: DC

## 2015-03-04 NOTE — Telephone Encounter (Signed)
Spoke with patient and clarified. Patient is requesting to take Requip 2 mg once a day. Changing in epic to reflect how patient is taking medication per Dr.Scott Luking and ordering medication to reflect changes.

## 2015-03-24 ENCOUNTER — Encounter: Payer: Self-pay | Admitting: Family Medicine

## 2015-04-06 ENCOUNTER — Encounter (INDEPENDENT_AMBULATORY_CARE_PROVIDER_SITE_OTHER): Payer: Self-pay | Admitting: *Deleted

## 2015-06-23 IMAGING — NM NM MYOCAR MULTI W/SPECT W/WALL MOTION & EF
2 series · 12 of 12 positions shown · non-contrast
Comparison: None.

CLINICAL DATA: 59-year-old female with no known history of coronary
artery disease referred for chest pain.

EXAM:
MYOCARDIAL IMAGING WITH SPECT (REST AND EXERCISE)
GATED LEFT VENTRICULAR WALL MOTION STUDY
LEFT VENTRICULAR EJECTION FRACTION
TECHNIQUE: Standard myocardial SPECT imaging was performed after resting
intravenous injection of 10 mCi Cc-22m sestamibi. Subsequently,
exercise tolerance test was performed by the patient under the
supervision of the Cardiology staff. At peak-stress, 30 mCi Cc-22m
sestamibi was injected intravenously and standard myocardial SPECT
imaging was performed. Quantitative gated imaging was also performed
to evaluate left ventricular wall motion, and estimate left
ventricular ejection fraction.

[Series 1: rest · 8.28mm/px · 6 of 64 frames shown]
[frame 6/64]
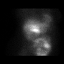
[frame 16/64]
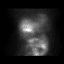
[frame 27/64]
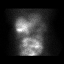
[frame 38/64]
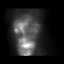
[frame 48/64]
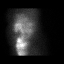
[frame 59/64]
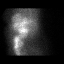

[Series 2: stress gated · 8.28mm/px · 6 of 64 frames shown]
[frame 6/64]
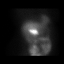
[frame 16/64]
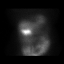
[frame 27/64]
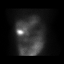
[frame 38/64]
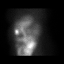
[frame 48/64]
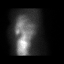
[frame 59/64]
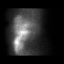

[12 of 12 positions shown; findings below may reference images not displayed]

FINDINGS: Pharmacological stress

Baseline EKG shows normal sinus rhythm. After infusion heart rate
increased from 72 beats per minute up to 96 beats per minute and
blood pressure increased from 137/85 up to 161/80. The test was
stopped after injection was complete. Post-injection EKG showed no
specific ischemic changes and no significant arrhythmias.

Perfusion: No decreased activity in the left ventricle on stress
imaging to suggest reversible ischemia or infarction.

Wall Motion: Normal left ventricular wall motion. No left
ventricular dilation.

Left Ventricular Ejection Fraction: 61 %

End diastolic volume 82 ml

End systolic volume 32 ml
IMPRESSION: 1. No reversible ischemia or infarction.

2. Normal left ventricular wall motion.

3. Left ventricular ejection fraction 61%

4. Low-risk stress test findings*.

*3663 Appropriate Use Criteria for Coronary Revascularization
Focused Update: J Am Coll Cardiol. 3663;59(9):857-881.
[URL]

## 2015-06-28 ENCOUNTER — Telehealth: Payer: Self-pay | Admitting: Family Medicine

## 2015-06-28 NOTE — Telephone Encounter (Signed)
Please call the patient. I was reviewing over records. The patient had colonoscopy back in 2010 that did show a small adenoma which is not cancer but it is her recommended to do a repeat colonoscopy in 5 years. This patient is now overdue. I recommend that she get back in to see Dr. Dereck Leep. Please help her with referral for colonoscopy. If she is already handle this problem please document appropriately.

## 2015-06-29 NOTE — Telephone Encounter (Signed)
Discussed with patient. Patient states that she has been receiving notices from Dr Olevia Perches office and is aware she is due her colonoscopy.  Offered to assist patient with referral and she stated she was fine and would call their office in a week or so to get the colonoscopy scheduled.

## 2015-07-10 DIAGNOSIS — Z96652 Presence of left artificial knee joint: Secondary | ICD-10-CM | POA: Diagnosis not present

## 2015-07-10 DIAGNOSIS — Z471 Aftercare following joint replacement surgery: Secondary | ICD-10-CM | POA: Diagnosis not present

## 2015-08-12 ENCOUNTER — Encounter: Payer: Self-pay | Admitting: Family Medicine

## 2015-08-12 ENCOUNTER — Ambulatory Visit (INDEPENDENT_AMBULATORY_CARE_PROVIDER_SITE_OTHER): Payer: Federal, State, Local not specified - PPO | Admitting: Family Medicine

## 2015-08-12 VITALS — BP 120/78 | Ht 64.0 in | Wt 238.6 lb

## 2015-08-12 DIAGNOSIS — D509 Iron deficiency anemia, unspecified: Secondary | ICD-10-CM | POA: Diagnosis not present

## 2015-08-12 DIAGNOSIS — G2581 Restless legs syndrome: Secondary | ICD-10-CM

## 2015-08-12 DIAGNOSIS — B9689 Other specified bacterial agents as the cause of diseases classified elsewhere: Secondary | ICD-10-CM

## 2015-08-12 DIAGNOSIS — E785 Hyperlipidemia, unspecified: Secondary | ICD-10-CM | POA: Diagnosis not present

## 2015-08-12 DIAGNOSIS — E038 Other specified hypothyroidism: Secondary | ICD-10-CM | POA: Diagnosis not present

## 2015-08-12 DIAGNOSIS — G4733 Obstructive sleep apnea (adult) (pediatric): Secondary | ICD-10-CM

## 2015-08-12 DIAGNOSIS — J019 Acute sinusitis, unspecified: Secondary | ICD-10-CM

## 2015-08-12 DIAGNOSIS — I10 Essential (primary) hypertension: Secondary | ICD-10-CM

## 2015-08-12 MED ORDER — AZITHROMYCIN 250 MG PO TABS: ORAL_TABLET | ORAL | Status: DC

## 2015-08-12 MED ORDER — HYDROCODONE-HOMATROPINE 5-1.5 MG/5ML PO SYRP: 5.0000 mL | ORAL_SOLUTION | Freq: Four times a day (QID) | ORAL | Status: DC | PRN

## 2015-08-12 MED ORDER — AMLODIPINE BESYLATE 2.5 MG PO TABS: 2.5000 mg | ORAL_TABLET | Freq: Every day | ORAL | Status: DC

## 2015-08-12 NOTE — Progress Notes (Signed)
   Subjective:    Patient ID: Karen Vazquez, female    DOB: 23-Aug-1955, 60 y.o.   MRN: [id number]  Hypertension This is a chronic problem. The current episode started more than 1 year ago. Pertinent negatives include no chest pain or shortness of breath. Risk factors for coronary artery disease include post-menopausal state. Treatments tried: hctz. There are no compliance problems.    Patient reports fatigue- husband tells her she coughs at night but she doesnt remember. Patient denies high fever chills sweats wheezing or difficulty breathing Restless legs bothers her but the medicine does help She also relates viral rate is seemingly doing okay but she does relate a fair amount of fatigue she takes her medicine she would like to check lab levels She does have sleep apnea she's thank you Megan new machine she wonders if we would be helping her regarding this because of patient states that the previous Dr. she was going to do this was years ago She does relate some fatigue and tiredness has history of iron deficient anemia but denies any rectal bleeding  Review of Systems  Constitutional: Negative for fever and activity change.  HENT: Positive for congestion and rhinorrhea. Negative for ear pain.   Eyes: Negative for discharge.  Respiratory: Positive for cough. Negative for shortness of breath and wheezing.   Cardiovascular: Negative for chest pain.       Objective:   Physical Exam  Constitutional: She appears well-developed.  HENT:  Head: Normocephalic.  Nose: Nose normal.  Mouth/Throat: Oropharynx is clear and moist. No oropharyngeal exudate.  Neck: Neck supple.  Cardiovascular: Normal rate and normal heart sounds.   No murmur heard. Pulmonary/Chest: Effort normal and breath sounds normal. She has no wheezes.  Lymphadenopathy:    She has no cervical adenopathy.  Skin: Skin is warm and dry.  Nursing note and vitals reviewed.         Assessment & Plan:  Sleep apnea she  will be talking with her equipment supplier to see if she get a new one or if she need to do another sleep study she would like to do this through [place]  Restless legs medication seems to be doing very well  Fatigue and tiredness with a history are deficient anemia we will check lab work  Hypothyroidism continue medication check TSH  Hypertension good control watch salt diet stay active continue medication  Greater and 25 minutes spent with the patient discussing multiple different issues  Viral URI possible secondary rhinosinusitis fill Z-Pak if ongoing troubles

## 2015-08-13 LAB — IRON AND TIBC
IRON SATURATION: 7 % — AB (ref 15–55)
IRON: 31 ug/dL (ref 27–159)
TIBC: 450 ug/dL (ref 250–450)
UIBC: 419 ug/dL (ref 131–425)

## 2015-08-13 LAB — CBC WITH DIFFERENTIAL/PLATELET
BASOS: 0 %
Basophils Absolute: 0 10*3/uL (ref 0.0–0.2)
EOS (ABSOLUTE): 0.1 10*3/uL (ref 0.0–0.4)
EOS: 3 %
HEMATOCRIT: 35.3 % (ref 34.0–46.6)
HEMOGLOBIN: 11.4 g/dL (ref 11.1–15.9)
IMMATURE GRANS (ABS): 0 10*3/uL (ref 0.0–0.1)
IMMATURE GRANULOCYTES: 0 %
LYMPHS: 35 %
Lymphocytes Absolute: 1.4 10*3/uL (ref 0.7–3.1)
MCH: 24.2 pg — ABNORMAL LOW (ref 26.6–33.0)
MCHC: 32.3 g/dL (ref 31.5–35.7)
MCV: 75 fL — AB (ref 79–97)
MONOS ABS: 0.4 10*3/uL (ref 0.1–0.9)
Monocytes: 10 %
NEUTROS PCT: 52 %
Neutrophils Absolute: 2.1 10*3/uL (ref 1.4–7.0)
Platelets: 197 10*3/uL (ref 150–379)
RBC: 4.72 x10E6/uL (ref 3.77–5.28)
RDW: 15.5 % — AB (ref 12.3–15.4)
WBC: 4.2 10*3/uL (ref 3.4–10.8)

## 2015-08-13 LAB — BASIC METABOLIC PANEL
BUN/Creatinine Ratio: 9 — ABNORMAL LOW (ref 12–28)
BUN: 9 mg/dL (ref 8–27)
CO2: 24 mmol/L (ref 18–29)
Calcium: 9.5 mg/dL (ref 8.7–10.3)
Chloride: 103 mmol/L (ref 96–106)
Creatinine, Ser: 0.95 mg/dL (ref 0.57–1.00)
GFR calc Af Amer: 75 mL/min/{1.73_m2} (ref >59)
GFR calc non Af Amer: 65 mL/min/{1.73_m2} (ref >59)
GLUCOSE: 88 mg/dL (ref 65–99)
POTASSIUM: 4.7 mmol/L (ref 3.5–5.2)
SODIUM: 144 mmol/L (ref 134–144)

## 2015-08-13 LAB — LIPID PANEL
CHOL/HDL RATIO: 4.3 ratio (ref 0.0–4.4)
Cholesterol, Total: 153 mg/dL (ref 100–199)
HDL: 36 mg/dL — ABNORMAL LOW (ref >39)
LDL CALC: 94 mg/dL (ref 0–99)
TRIGLYCERIDES: 115 mg/dL (ref 0–149)
VLDL Cholesterol Cal: 23 mg/dL (ref 5–40)

## 2015-08-13 LAB — TSH: TSH: 2.32 u[IU]/mL (ref 0.450–4.500)

## 2015-08-13 LAB — FERRITIN: Ferritin: 14 ng/mL — ABNORMAL LOW (ref 15–150)

## 2015-08-25 ENCOUNTER — Other Ambulatory Visit: Payer: Self-pay | Admitting: *Deleted

## 2015-08-25 DIAGNOSIS — D509 Iron deficiency anemia, unspecified: Secondary | ICD-10-CM

## 2015-08-25 LAB — POC HEMOCCULT BLD/STL (HOME/3-CARD/SCREEN)
Card #3 Fecal Occult Blood, POC: NEGATIVE
FECAL OCCULT BLD: NEGATIVE
FECAL OCCULT BLD: NEGATIVE

## 2015-08-26 ENCOUNTER — Ambulatory Visit: Payer: Federal, State, Local not specified - PPO | Admitting: *Deleted

## 2015-08-26 VITALS — BP 134/82

## 2015-08-26 DIAGNOSIS — I10 Essential (primary) hypertension: Secondary | ICD-10-CM

## 2015-08-26 NOTE — Progress Notes (Signed)
   Subjective:    Patient ID: Karen Vazquez, female    DOB: 1955/08/01, 60 y.o.   MRN: [id number]  HPI Patient's blood pressure med changed to Norvasc 2.5 mg at last visit 2 weeks ago and is here today to check blood pressure on new med. Blood pressure 134/82. Blood pressure results reviewed with Dr Richardson Landry. Dr Richardson Landry states BP is good -continue same medications and recheck with doctor in 6 months. Patient verbalized understanding.   Review of Systems     Objective:   Physical Exam        Assessment & Plan:

## 2015-08-27 ENCOUNTER — Encounter: Payer: Self-pay | Admitting: Family Medicine

## 2015-08-27 NOTE — Addendum Note (Signed)
Addended by: Dairl Ponder on: 08/27/2015 09:48 AM   Modules accepted: Orders

## 2015-09-01 ENCOUNTER — Other Ambulatory Visit: Payer: Self-pay | Admitting: Family Medicine

## 2015-09-09 ENCOUNTER — Telehealth: Payer: Self-pay | Admitting: Family Medicine

## 2015-09-09 NOTE — Telephone Encounter (Signed)
Pt is requesting a prescription for a cpap machine.  She is needing the prescription to say: auto flow between 5 and 20.      This needs to be faxed to Zeb at [phone]

## 2015-09-09 NOTE — Telephone Encounter (Signed)
Patient may have prescription for CPAP machine as requested with the parameters requested please fax it(at this present moment I do not know if the patient will be required to come for an office visit regarding this we will wait and see)

## 2015-09-15 ENCOUNTER — Other Ambulatory Visit: Payer: Self-pay | Admitting: Family Medicine

## 2015-09-29 ENCOUNTER — Other Ambulatory Visit: Payer: Self-pay | Admitting: *Deleted

## 2015-09-29 NOTE — Progress Notes (Signed)
Patient may have this +5 refills 

## 2015-09-30 MED ORDER — ZOLPIDEM TARTRATE 10 MG PO TABS: 10.0000 mg | ORAL_TABLET | Freq: Every evening | ORAL | Status: DC | PRN

## 2015-10-26 ENCOUNTER — Ambulatory Visit (INDEPENDENT_AMBULATORY_CARE_PROVIDER_SITE_OTHER): Payer: Federal, State, Local not specified - PPO | Admitting: Internal Medicine

## 2015-11-03 ENCOUNTER — Encounter (INDEPENDENT_AMBULATORY_CARE_PROVIDER_SITE_OTHER): Payer: Self-pay | Admitting: *Deleted

## 2015-11-03 ENCOUNTER — Encounter (INDEPENDENT_AMBULATORY_CARE_PROVIDER_SITE_OTHER): Payer: Self-pay | Admitting: Internal Medicine

## 2015-11-03 ENCOUNTER — Ambulatory Visit (INDEPENDENT_AMBULATORY_CARE_PROVIDER_SITE_OTHER): Payer: Federal, State, Local not specified - PPO | Admitting: Internal Medicine

## 2015-11-03 ENCOUNTER — Telehealth (INDEPENDENT_AMBULATORY_CARE_PROVIDER_SITE_OTHER): Payer: Self-pay | Admitting: *Deleted

## 2015-11-03 ENCOUNTER — Other Ambulatory Visit (INDEPENDENT_AMBULATORY_CARE_PROVIDER_SITE_OTHER): Payer: Self-pay | Admitting: Internal Medicine

## 2015-11-03 VITALS — BP 130/80 | HR 72 | Temp 97.7°F | Ht 64.0 in | Wt 239.5 lb

## 2015-11-03 DIAGNOSIS — K635 Polyp of colon: Secondary | ICD-10-CM

## 2015-11-03 DIAGNOSIS — C184 Malignant neoplasm of transverse colon: Secondary | ICD-10-CM

## 2015-11-03 DIAGNOSIS — Z86010 Personal history of colonic polyps: Secondary | ICD-10-CM

## 2015-11-03 NOTE — Progress Notes (Addendum)
Subjective:    Patient ID: Karen Vazquez, female    DOB: 1956-03-04, 60 y.o.   MRN: [id number]  HPI  08/12/2015 Ferritin, TIBC 450, UIBC 419, Iron 31, Sat 7.  Hx of IDA. Seen in 2015 for same. All her hemocult cards were negative. 08/25/2015 Hemocult negative x 3. She denies any rectal bleeding. Appetite is good. No weight loss. BM daily. No melena or BRRB.  Her last colonoscopy in 2010 for screening purposes reveal 4 mm polyp in transverse colon, otherwise normal colonoscopy. Biopsy: Tubular adenoma.  Review of Systems Past Medical History:  Diagnosis Date  . Arthritis   . Blood transfusion 2011   at [facility]  . Hypertension   . Hypothyroidism   . Restless leg syndrome   . Sleep apnea    does not use c-pap machine    Past Surgical History:  Procedure Laterality Date  . APPENDECTOMY    . BLADDER SUSPENSION  12/29/2010   Procedure: TRANSVAGINAL TAPE (TVT) PROCEDURE;  Surgeon: Daria Pastures;  Location: [facility];  Service: Gynecology;  Laterality: N/A;  . BREAST SURGERY     left breast lumpectomy 1977  . CYSTOCELE REPAIR  12/29/2010   Procedure: ANTERIOR REPAIR (CYSTOCELE);  Surgeon: Daria Pastures;  Location: [facility];  Service: Gynecology;  Laterality: N/A;  . CYSTOSCOPY  12/29/2010   Procedure: CYSTOSCOPY;  Surgeon: Daria Pastures;  Location: [facility];  Service: Gynecology;  Laterality: N/A;  . JOINT REPLACEMENT  2011   rt knee  . REPLACEMENT TOTAL KNEE Right 2011  . TONSILLECTOMY    . TOTAL KNEE ARTHROPLASTY Left 11/17/2014   Procedure: LEFT TOTAL KNEE ARTHROPLASTY;  Surgeon: Gaynelle Arabian, MD;  Location: [facility];  Service: Orthopedics;  Laterality: Left;  . TUBAL LIGATION    . VAGINAL HYSTERECTOMY  12/29/2010   Procedure: HYSTERECTOMY VAGINAL;  Surgeon: Daria Pastures;  Location: [facility];  Service: Gynecology;  Laterality: N/A;    Allergies  Allergen Reactions  . Lisinopril Cough    Current Outpatient Prescriptions on File Prior to Visit  Medication Sig  Dispense Refill  . amLODipine (NORVASC) 2.5 MG tablet Take 1 tablet (2.5 mg total) by mouth daily. 30 tablet 5  . ARMOUR THYROID 15 MG tablet TAKE ONE (1) TABLET EACH DAY 30 tablet 5  . ARMOUR THYROID 30 MG tablet TAKE ONE (1) TABLET EACH DAY 30 tablet 5  . rOPINIRole (REQUIP) 2 MG tablet TAKE 1 TABLET EVERY EVENING 90 tablet 1  . zolpidem (AMBIEN) 10 MG tablet Take 1 tablet (10 mg total) by mouth at bedtime as needed for sleep. 90 tablet 1  . azithromycin (ZITHROMAX Z-PAK) 250 MG tablet Take 2 tablets (500 mg) on  Day 1,  followed by 1 tablet (250 mg) once daily on Days 2 through 5. (Patient not taking: Reported on 11/03/2015) 6 each 0  . HYDROcodone-homatropine (HYCODAN) 5-1.5 MG/5ML syrup Take 5 mLs by mouth every 6 (six) hours as needed for cough. (Patient not taking: Reported on 11/03/2015) 90 mL 0   No current facility-administered medications on file prior to visit.        Objective:   Physical Exam Blood pressure 130/80, pulse 72, temperature 97.7 F (36.5 C), height 5\' 4"  (1.626 m), weight 239 lb 8 oz (108.6 kg), last menstrual period 05/24/2010. Alert and oriented. Skin warm and dry. Oral mucosa is moist.   . Sclera anicteric, conjunctivae is pink. Thyroid not enlarged. No cervical lymphadenopathy. Lungs clear. Heart regular rate and rhythm.  Abdomen is soft. Bowel sounds are positive. No hepatomegaly. No abdominal masses felt. No tenderness.  No edema to lower extremities. Patient is alert and oriented.        Assessment & Plan:  IDA. Hx of tubular adenoma. Last colonoscopy in 2010.  Discussed with Dr Laural Golden.

## 2015-11-03 NOTE — Telephone Encounter (Signed)
Patient needs movi 

## 2015-11-03 NOTE — Patient Instructions (Signed)
The risks and benefits such as perforation, bleeding, and infection were reviewed with the patient and is agreeable. 

## 2015-11-04 MED ORDER — PEG-KCL-NACL-NASULF-NA ASC-C 100 G PO SOLR: 1.0000 | Freq: Once | ORAL | 0 refills | Status: AC

## 2015-11-06 DIAGNOSIS — Z471 Aftercare following joint replacement surgery: Secondary | ICD-10-CM | POA: Diagnosis not present

## 2015-11-06 DIAGNOSIS — Z96652 Presence of left artificial knee joint: Secondary | ICD-10-CM | POA: Diagnosis not present

## 2015-11-20 DIAGNOSIS — G4733 Obstructive sleep apnea (adult) (pediatric): Secondary | ICD-10-CM | POA: Diagnosis not present

## 2015-12-20 DIAGNOSIS — G4733 Obstructive sleep apnea (adult) (pediatric): Secondary | ICD-10-CM | POA: Diagnosis not present

## 2016-01-05 DIAGNOSIS — Z01419 Encounter for gynecological examination (general) (routine) without abnormal findings: Secondary | ICD-10-CM | POA: Diagnosis not present

## 2016-01-05 DIAGNOSIS — Z1231 Encounter for screening mammogram for malignant neoplasm of breast: Secondary | ICD-10-CM | POA: Diagnosis not present

## 2016-01-05 DIAGNOSIS — Z6841 Body Mass Index (BMI) 40.0 and over, adult: Secondary | ICD-10-CM | POA: Diagnosis not present

## 2016-01-07 ENCOUNTER — Other Ambulatory Visit: Payer: Self-pay | Admitting: Family Medicine

## 2016-01-12 ENCOUNTER — Ambulatory Visit (INDEPENDENT_AMBULATORY_CARE_PROVIDER_SITE_OTHER): Payer: Federal, State, Local not specified - PPO | Admitting: Family Medicine

## 2016-01-12 ENCOUNTER — Encounter: Payer: Self-pay | Admitting: Family Medicine

## 2016-01-12 VITALS — BP 110/78 | Temp 98.8°F | Ht 64.0 in | Wt 239.0 lb

## 2016-01-12 DIAGNOSIS — E784 Other hyperlipidemia: Secondary | ICD-10-CM | POA: Diagnosis not present

## 2016-01-12 DIAGNOSIS — G4700 Insomnia, unspecified: Secondary | ICD-10-CM

## 2016-01-12 DIAGNOSIS — I10 Essential (primary) hypertension: Secondary | ICD-10-CM

## 2016-01-12 DIAGNOSIS — N329 Bladder disorder, unspecified: Secondary | ICD-10-CM | POA: Diagnosis not present

## 2016-01-12 DIAGNOSIS — E7849 Other hyperlipidemia: Secondary | ICD-10-CM

## 2016-01-12 DIAGNOSIS — E038 Other specified hypothyroidism: Secondary | ICD-10-CM | POA: Diagnosis not present

## 2016-01-12 LAB — POCT URINALYSIS DIPSTICK
PH UA: 6
SPEC GRAV UA: 1.02

## 2016-01-12 MED ORDER — OXYBUTYNIN CHLORIDE ER 5 MG PO TB24: 5.0000 mg | ORAL_TABLET | Freq: Every day | ORAL | 2 refills | Status: DC

## 2016-01-12 MED ORDER — ALPRAZOLAM 0.5 MG PO TABS: 0.5000 mg | ORAL_TABLET | Freq: Two times a day (BID) | ORAL | 2 refills | Status: DC | PRN

## 2016-01-12 MED ORDER — ROPINIROLE HCL 4 MG PO TABS: 4.0000 mg | ORAL_TABLET | Freq: Every evening | ORAL | 5 refills | Status: DC

## 2016-01-12 NOTE — Progress Notes (Signed)
   Subjective:    Patient ID: Karen Vazquez, female    DOB: 1955/03/23, 60 y.o.   MRN: [id number]  Urinary Tract Infection   This is a new problem. The current episode started 1 to 4 weeks ago. The problem occurs intermittently. The problem has been unchanged. The patient is experiencing no pain. There has been no fever. Associated symptoms include urgency. She has tried increased fluids for the symptoms. The treatment provided no relief.   Patient is having trouble with her restless leg syndrome. She wants to discuss changing the Requip she is taking for this.  This patient is also under a fair amount of stress both of her daughters are having some issues. Causes patient to be anxious and worried at times Patient also feels like she has overactive bladder with a fair amount of urgency but denies hematuria denies rectal bleeding Patient also has hypertension takes her medicine on a regular basis Has hypothyroidism takes medicine on a regular basis Osteoarthritis uses Tylenol when necessary Hyperlipidemia does try to watch fats in her diet  Review of Systems  Genitourinary: Positive for urgency.   Denies chest tightness pressure pain shortness breath rectal bleeding hematuria    Objective:   Physical Exam Lungs clear no crackles heart is regular pulse normal BP good abdomen soft extremities no edema skin warm dry       Assessment & Plan:  Possible overactive bladder versus urinary tract infection-urine culture sent Tried Ditropan XL 5 mg daily she will start this once urine culture comes back  Increase Requip to 4 mg each evening the help with restless legs  Stress-related issues Xanax as necessary cautioned drowsiness  Hypothyroidism continue medication check lab work in the spring Blood pressure good control continue current measures follow-up in spring

## 2016-01-14 LAB — URINE CULTURE

## 2016-01-20 DIAGNOSIS — G4733 Obstructive sleep apnea (adult) (pediatric): Secondary | ICD-10-CM | POA: Diagnosis not present

## 2016-02-15 ENCOUNTER — Ambulatory Visit: Payer: Federal, State, Local not specified - PPO | Admitting: Family Medicine

## 2016-02-19 DIAGNOSIS — G4733 Obstructive sleep apnea (adult) (pediatric): Secondary | ICD-10-CM | POA: Diagnosis not present

## 2016-03-02 ENCOUNTER — Other Ambulatory Visit: Payer: Self-pay | Admitting: Family Medicine

## 2016-03-03 ENCOUNTER — Encounter (HOSPITAL_COMMUNITY)
Admission: RE | Disposition: A | Discharge status: Home / Self Care | Payer: Self-pay | Source: Ambulatory Visit | Attending: Internal Medicine

## 2016-03-03 ENCOUNTER — Encounter (HOSPITAL_COMMUNITY): Payer: Self-pay | Admitting: *Deleted

## 2016-03-03 ENCOUNTER — Ambulatory Visit (HOSPITAL_COMMUNITY)
Admission: RE | Admit: 2016-03-03 | Discharge: 2016-03-03 | Disposition: A | Discharge status: Home / Self Care | Payer: Federal, State, Local not specified - PPO | Source: Ambulatory Visit | Attending: Internal Medicine | Admitting: Internal Medicine

## 2016-03-03 DIAGNOSIS — G4730 Sleep apnea, unspecified: Secondary | ICD-10-CM | POA: Insufficient documentation

## 2016-03-03 DIAGNOSIS — Z1211 Encounter for screening for malignant neoplasm of colon: Secondary | ICD-10-CM | POA: Insufficient documentation

## 2016-03-03 DIAGNOSIS — E039 Hypothyroidism, unspecified: Secondary | ICD-10-CM | POA: Diagnosis not present

## 2016-03-03 DIAGNOSIS — D649 Anemia, unspecified: Secondary | ICD-10-CM | POA: Diagnosis not present

## 2016-03-03 DIAGNOSIS — K644 Residual hemorrhoidal skin tags: Secondary | ICD-10-CM | POA: Diagnosis not present

## 2016-03-03 DIAGNOSIS — Z86010 Personal history of colonic polyps: Secondary | ICD-10-CM | POA: Insufficient documentation

## 2016-03-03 DIAGNOSIS — Z09 Encounter for follow-up examination after completed treatment for conditions other than malignant neoplasm: Secondary | ICD-10-CM | POA: Diagnosis not present

## 2016-03-03 DIAGNOSIS — I10 Essential (primary) hypertension: Secondary | ICD-10-CM | POA: Insufficient documentation

## 2016-03-03 DIAGNOSIS — C184 Malignant neoplasm of transverse colon: Secondary | ICD-10-CM

## 2016-03-03 DIAGNOSIS — D125 Benign neoplasm of sigmoid colon: Secondary | ICD-10-CM | POA: Diagnosis not present

## 2016-03-03 DIAGNOSIS — G2581 Restless legs syndrome: Secondary | ICD-10-CM | POA: Insufficient documentation

## 2016-03-03 DIAGNOSIS — Z79899 Other long term (current) drug therapy: Secondary | ICD-10-CM | POA: Insufficient documentation

## 2016-03-03 HISTORY — PX: COLONOSCOPY: SHX5424

## 2016-03-03 SURGERY — COLONOSCOPY
Anesthesia: Moderate Sedation

## 2016-03-03 MED ORDER — MIDAZOLAM HCL 5 MG/5ML IJ SOLN
INTRAMUSCULAR | Status: AC
  Filled 2016-03-03: qty 10

## 2016-03-03 MED ORDER — STERILE WATER FOR IRRIGATION IR SOLN
Status: DC | PRN
  Administered 2016-03-03: 2.5 mL

## 2016-03-03 MED ORDER — SODIUM CHLORIDE 0.9 % IV SOLN
INTRAVENOUS | Status: DC
  Administered 2016-03-03: 13:00:00 via INTRAVENOUS

## 2016-03-03 MED ORDER — MEPERIDINE HCL 50 MG/ML IJ SOLN
INTRAMUSCULAR | Status: DC | PRN
  Administered 2016-03-03 (×2): 25 mg via INTRAVENOUS

## 2016-03-03 MED ORDER — MEPERIDINE HCL 50 MG/ML IJ SOLN
INTRAMUSCULAR | Status: DC
Start: 2016-03-03 — End: 2016-03-03
  Filled 2016-03-03: qty 1

## 2016-03-03 MED ORDER — MIDAZOLAM HCL 5 MG/5ML IJ SOLN
INTRAMUSCULAR | Status: DC | PRN
  Administered 2016-03-03: 1 mg via INTRAVENOUS
  Administered 2016-03-03 (×2): 2 mg via INTRAVENOUS
  Administered 2016-03-03: 1 mg via INTRAVENOUS
  Administered 2016-03-03: 2 mg via INTRAVENOUS

## 2016-03-03 NOTE — Op Note (Signed)
[facility] Patient Name: Karen Vazquez Procedure Date: 03/03/2016 1:07 PM MRN: [id number] Date of Birth: 10-29-1955 Attending MD: Hildred Laser , MD CSN: [id number] Age: 60 Admit Type: Outpatient Procedure:                Colonoscopy Indications:              High risk colon cancer surveillance: Personal                            history of colonic polyps Providers:                Hildred Laser, MD, Charlyne Petrin RN, RN, Sherlyn Lees, Technician Referring MD:             Elayne Snare. Wolfgang Phoenix, MD Medicines:                Meperidine 50 mg IV, Midazolam 8 mg IV Complications:            No immediate complications. Estimated Blood Loss:     Estimated blood loss was minimal. Procedure:                Pre-Anesthesia Assessment:                           - Prior to the procedure, a History and Physical                            was performed, and patient medications and                            allergies were reviewed. The patient's tolerance of                            previous anesthesia was also reviewed. The risks                            and benefits of the procedure and the sedation                            options and risks were discussed with the patient.                            All questions were answered, and informed consent                            was obtained. Prior Anticoagulants: The patient has                            taken no previous anticoagulant or antiplatelet                            agents. ASA Grade Assessment: II - A patient with  mild systemic disease. After reviewing the risks                            and benefits, the patient was deemed in                            satisfactory condition to undergo the procedure.                           After obtaining informed consent, the colonoscope                            was passed under direct vision. Throughout the          procedure, the patient's blood pressure, pulse, and                            oxygen saturations were monitored continuously. The                            [id number]) scope was introduced through                            the anus and advanced to the the cecum, identified                            by appendiceal orifice and ileocecal valve. The                            colonoscopy was performed without difficulty. The                            patient tolerated the procedure well. The quality                            of the bowel preparation was adequate. The                            ileocecal valve, appendiceal orifice, and rectum                            were photographed. Scope In: 1:21:20 PM Scope Out: 1:41:34 PM Scope Withdrawal Time: 0 hours 14 minutes 5 seconds  Total Procedure Duration: 0 hours 20 minutes 14 seconds  Findings:      The perianal and digital rectal examinations were normal.      A small polyp was found in the distal sigmoid colon. The polyp was       semi-pedunculated. The polyp was removed with a cold snare. Resection       and retrieval were complete.      The exam was otherwise normal throughout the examined colon.      External hemorrhoids were found during retroflexion. The hemorrhoids       were small. Impression:               - One small polyp in the distal sigmoid colon,  removed with a cold snare. Resected and retrieved.                           - External hemorrhoids. Moderate Sedation:      Moderate (conscious) sedation was administered by the endoscopy nurse       and supervised by the endoscopist. The following parameters were       monitored: oxygen saturation, heart rate, blood pressure, CO2       capnography and response to care. Total physician intraservice time was       25 minutes. Recommendation:           - Patient has a contact number available for                             emergencies. The signs and symptoms of potential                            delayed complications were discussed with the                            patient. Return to normal activities tomorrow.                            Written discharge instructions were provided to the                            patient.                           - Resume previous diet today.                           - Continue present medications.                           - Await pathology results.                           - Repeat colonoscopy in 7 years for surveillance. Procedure Code(s):        --- Professional ---                           [phone], Colonoscopy, flexible; with removal of                            tumor(s), polyp(s), or other lesion(s) by snare                            technique                           99152, Moderate sedation services provided by the                            same physician or other qualified health care  professional performing the diagnostic or                            therapeutic service that the sedation supports,                            requiring the presence of an independent trained                            observer to assist in the monitoring of the                            patient's level of consciousness and physiological                            status; initial 15 minutes of intraservice time,                            patient age 36 years or older                           [phone], Moderate sedation services; each additional                            15 minutes intraservice time Diagnosis Code(s):        --- Professional ---                           Z86.010, Personal history of colonic polyps                           D12.5, Benign neoplasm of sigmoid colon                           K64.4, Residual hemorrhoidal skin tags CPT copyright 2016 American Medical Association. All rights reserved. The codes documented in this  report are preliminary and upon coder review may  be revised to meet current compliance requirements. Hildred Laser, MD Hildred Laser, MD 03/03/2016 1:47:14 PM This report has been signed electronically. Number of Addenda: 0

## 2016-03-03 NOTE — H&P (Signed)
Karen Vazquez is an 60 y.o. female.   Chief Complaint: Patient is here for colonoscopy. HPI: This 26 -year-old Caucasian female who has history of colonic adenoma and is here for surveillance colonoscopy. Her last exam was in January 2010. She denies abdominal pain change in bowel habits or rectal bleeding. She has history of iron deficiency anemia secondary to frequent blood donations.  Past Medical History:  Diagnosis Date  . Arthritis   . Blood transfusion 2011   at [facility]  . Hypertension   . Hypothyroidism   . Restless leg syndrome   . Sleep apnea    does not use c-pap machine    Past Surgical History:  Procedure Laterality Date  . APPENDECTOMY    . BLADDER SUSPENSION  12/29/2010   Procedure: TRANSVAGINAL TAPE (TVT) PROCEDURE;  Surgeon: Daria Pastures;  Location: [facility];  Service: Gynecology;  Laterality: N/A;  . BREAST SURGERY     left breast lumpectomy 1977  . CYSTOCELE REPAIR  12/29/2010   Procedure: ANTERIOR REPAIR (CYSTOCELE);  Surgeon: Daria Pastures;  Location: [facility];  Service: Gynecology;  Laterality: N/A;  . CYSTOSCOPY  12/29/2010   Procedure: CYSTOSCOPY;  Surgeon: Daria Pastures;  Location: [facility];  Service: Gynecology;  Laterality: N/A;  . JOINT REPLACEMENT  2011   rt knee  . REPLACEMENT TOTAL KNEE Right 2011  . TONSILLECTOMY    . TOTAL KNEE ARTHROPLASTY Left 11/17/2014   Procedure: LEFT TOTAL KNEE ARTHROPLASTY;  Surgeon: Gaynelle Arabian, MD;  Location: [facility];  Service: Orthopedics;  Laterality: Left;  . TUBAL LIGATION    . VAGINAL HYSTERECTOMY  12/29/2010   Procedure: HYSTERECTOMY VAGINAL;  Surgeon: Daria Pastures;  Location: [facility];  Service: Gynecology;  Laterality: N/A;    Family History  Problem Relation Age of Onset  . Stroke Mother   . Hypertension Mother   . Hyperlipidemia Mother   . Heart disease Father   . COPD Father   . Cancer Paternal Grandmother     breast  . Diabetes Paternal Grandfather    Social History:  reports that she  has never smoked. She has never used smokeless tobacco. She reports that she does not drink alcohol or use drugs.  Allergies:  Allergies  Allergen Reactions  . Lisinopril Cough    Medications Prior to Admission  Medication Sig Dispense Refill  . ALPRAZolam (XANAX) 0.5 MG tablet Take 1 tablet (0.5 mg total) by mouth 2 (two) times daily as needed for anxiety or sleep. 30 tablet 2  . amLODipine (NORVASC) 2.5 MG tablet Take 1 tablet (2.5 mg total) by mouth daily. 30 tablet 5  . ARMOUR THYROID 15 MG tablet TAKE ONE (1) TABLET EACH DAY 30 tablet 5  . ARMOUR THYROID 30 MG tablet TAKE ONE (1) TABLET EACH DAY 30 tablet 2  . oxybutynin (DITROPAN XL) 5 MG 24 hr tablet Take 1 tablet (5 mg total) by mouth at bedtime. (Patient taking differently: Take 5 mg by mouth daily as needed. ) 30 tablet 2  . rOPINIRole (REQUIP) 4 MG tablet Take 1 tablet (4 mg total) by mouth every evening. 30 tablet 5  . zolpidem (AMBIEN) 10 MG tablet Take 1 tablet (10 mg total) by mouth at bedtime as needed for sleep. 90 tablet 1    No results found for this or any previous visit (from the past 48 hour(s)). No results found.  ROS  Blood pressure (!) 152/67, pulse 85, temperature 98.7 F (37.1 C), temperature source Oral,  resp. rate 19, height 5\' 4"  (1.626 m), weight 233 lb (105.7 kg), last menstrual period 05/24/2010, SpO2 100 %. Physical Exam  Constitutional: She appears well-developed and well-nourished.  HENT:  Mouth/Throat: Oropharynx is clear and moist.  Eyes: Conjunctivae are normal. No scleral icterus.  Neck: No thyromegaly present.  Cardiovascular: Normal rate and regular rhythm.   Murmur (grade 2/6 systolic ejection murmur best heard at left sternal border.) heard. Respiratory: Effort normal and breath sounds normal.  GI: Soft. She exhibits no distension and no mass. There is no tenderness.  Musculoskeletal: She exhibits no edema.  Lymphadenopathy:    She has no cervical adenopathy.  Neurological: She is  alert.  Skin: Skin is warm and dry.     Assessment/Plan History of colonic adenoma. Surveillance colonoscopy.  Hildred Laser, MD 03/03/2016, 1:12 PM

## 2016-03-03 NOTE — Discharge Instructions (Signed)
Resume usual medications and diet. No driving for 24 hours. Consider taking 2 Flintstones with iron chewable daily she decided to donate blood regularly. No driving for 24 hours. Physician will call with biopsy results.   Colonoscopy, Adult, Care After This sheet gives you information about how to care for yourself after your procedure. Your doctor may also give you more specific instructions. If you have problems or questions, call your doctor. Follow these instructions at home: General instructions  For the first 24 hours after the procedure:  Do not drive or use machinery.  Do not sign important documents.  Do not drink alcohol.  Do your daily activities more slowly than normal.  Eat foods that are soft and easy to digest.  Rest often.  Take over-the-counter or prescription medicines only as told by your doctor.  It is up to you to get the results of your procedure. Ask your doctor, or the department performing the procedure, when your results will be ready. To help cramping and bloating:  Try walking around.  Put heat on your belly (abdomen) as told by your doctor. Use a heat source that your doctor recommends, such as a moist heat pack or a heating pad.  Put a towel between your skin and the heat source.  Leave the heat on for 20-30 minutes.  Remove the heat if your skin turns bright red. This is especially important if you cannot feel pain, heat, or cold. You can get burned. Eating and drinking  Drink enough fluid to keep your pee (urine) clear or pale yellow.  Return to your normal diet as told by your doctor. Avoid heavy or fried foods that are hard to digest.  Avoid drinking alcohol for as long as told by your doctor. Contact a doctor if:  You have blood in your poop (stool) 2-3 days after the procedure. Get help right away if:  You have more than a small amount of blood in your poop.  You see large clumps of tissue (blood clots) in your poop.  Your  belly is swollen.  You feel sick to your stomach (nauseous).  You throw up (vomit).  You have a fever.  You have belly pain that gets worse, and medicine does not help your pain. This information is not intended to replace advice given to you by your health care provider. Make sure you discuss any questions you have with your health care provider. Document Released: 04/09/2010 Document Revised: 11/30/2015 Document Reviewed: 11/30/2015 Elsevier Interactive Patient Education  2017 [place].   Colon Polyps Introduction Polyps are tissue growths inside the body. Polyps can grow in many places, including the large intestine (colon). A polyp may be a round bump or a mushroom-shaped growth. You could have one polyp or several. Most colon polyps are noncancerous (benign). However, some colon polyps can become cancerous over time. What are the causes? The exact cause of colon polyps is not known. What increases the risk? This condition is more likely to develop in people who:  Have a family history of colon cancer or colon polyps.  Are older than 58 or older than 45 if they are African American.  Have inflammatory bowel disease, such as ulcerative colitis or Crohn disease.  Are overweight.  Smoke cigarettes.  Do not get enough exercise.  Drink too much alcohol.  Eat a diet that is:  High in fat and red meat.  Low in fiber.  Had childhood cancer that was treated with abdominal radiation. What are  the signs or symptoms? Most polyps do not cause symptoms. If you have symptoms, they may include:  Blood coming from your rectum when having a bowel movement.  Blood in your stool.The stool may look dark red or black.  A change in bowel habits, such as constipation or diarrhea. How is this diagnosed? This condition is diagnosed with a colonoscopy. This is a procedure that uses a lighted, flexible scope to look at the inside of your colon. How is this treated? Treatment for  this condition involves removing any polyps that are found. Those polyps will then be tested for cancer. If cancer is found, your health care provider will talk to you about options for colon cancer treatment. Follow these instructions at home: Diet  Eat plenty of fiber, such as fruits, vegetables, and whole grains.  Eat foods that are high in calcium and vitamin D, such as milk, cheese, yogurt, eggs, liver, fish, and broccoli.  Limit foods high in fat, red meats, and processed meats, such as hot dogs, sausage, bacon, and lunch meats.  Maintain a healthy weight, or lose weight if recommended by your health care provider. General instructions  Do not smoke cigarettes.  Do not drink alcohol excessively.  Keep all follow-up visits as told by your health care provider. This is important. This includes keeping regularly scheduled colonoscopies. Talk to your health care provider about when you need a colonoscopy.  Exercise every day or as told by your health care provider. Contact a health care provider if:  You have new or worsening bleeding during a bowel movement.  You have new or increased blood in your stool.  You have a change in bowel habits.  You unexpectedly lose weight. This information is not intended to replace advice given to you by your health care provider. Make sure you discuss any questions you have with your health care provider. Document Released: 12/02/2003 Document Revised: 08/13/2015 Document Reviewed: 01/26/2015  2017 Elsevier    Hemorrhoids Hemorrhoids are swollen veins in and around the rectum or anus. There are two types of hemorrhoids:  Internal hemorrhoids. These occur in the veins that are just inside the rectum. They may poke through to the outside and become irritated and painful.  External hemorrhoids. These occur in the veins that are outside of the anus and can be felt as a painful swelling or hard lump near the anus. Most hemorrhoids do not cause  serious problems, and they can be managed with home treatments such as diet and lifestyle changes. If home treatments do not help your symptoms, procedures can be done to shrink or remove the hemorrhoids. What are the causes? This condition is caused by increased pressure in the anal area. This pressure may result from various things, including:  Constipation.  Straining to have a bowel movement.  Diarrhea.  Pregnancy.  Obesity.  Sitting for long periods of time.  Heavy lifting or other activity that causes you to strain.  Anal sex. What are the signs or symptoms? Symptoms of this condition include:  Pain.  Anal itching or irritation.  Rectal bleeding.  Leakage of stool (feces).  Anal swelling.  One or more lumps around the anus. How is this diagnosed? This condition can often be diagnosed through a visual exam. Other exams or tests may also be done, such as:  Examination of the rectal area with a gloved hand (digital rectal exam).  Examination of the anal canal using a small tube (anoscope).  A blood test, if you  have lost a significant amount of blood.  A test to look inside the colon (sigmoidoscopy or colonoscopy). How is this treated? This condition can usually be treated at home. However, various procedures may be done if dietary changes, lifestyle changes, and other home treatments do not help your symptoms. These procedures can help make the hemorrhoids smaller or remove them completely. Some of these procedures involve surgery, and others do not. Common procedures include:  Rubber band ligation. Rubber bands are placed at the base of the hemorrhoids to cut off the blood supply to them.  Sclerotherapy. Medicine is injected into the hemorrhoids to shrink them.  Infrared coagulation. A type of light energy is used to get rid of the hemorrhoids.  Hemorrhoidectomy surgery. The hemorrhoids are surgically removed, and the veins that supply them are tied  off.  Stapled hemorrhoidopexy surgery. A circular stapling device is used to remove the hemorrhoids and use staples to cut off the blood supply to them. Follow these instructions at home: Eating and drinking  Eat foods that have a lot of fiber in them, such as whole grains, beans, nuts, fruits, and vegetables. Ask your health care provider about taking products that have added fiber (fiber supplements).  Drink enough fluid to keep your urine clear or pale yellow. Managing pain and swelling  Take warm sitz baths for 20 minutes, 3-4 times a day to ease pain and discomfort.  If directed, apply ice to the affected area. Using ice packs between sitz baths may be helpful.  Put ice in a plastic bag.  Place a towel between your skin and the bag.  Leave the ice on for 20 minutes, 2-3 times a day. General instructions  Take over-the-counter and prescription medicines only as told by your health care provider.  Use medicated creams or suppositories as told.  Exercise regularly.  Go to the bathroom when you have the urge to have a bowel movement. Do not wait.  Avoid straining to have bowel movements.  Keep the anal area dry and clean. Use wet toilet paper or moist towelettes after a bowel movement.  Do not sit on the toilet for long periods of time. This increases blood pooling and pain. Contact a health care provider if:  You have increasing pain and swelling that are not controlled by treatment or medicine.  You have uncontrolled bleeding.  You have difficulty having a bowel movement, or you are unable to have a bowel movement.  You have pain or inflammation outside the area of the hemorrhoids. This information is not intended to replace advice given to you by your health care provider. Make sure you discuss any questions you have with your health care provider. Document Released: 03/04/2000 Document Revised: 08/05/2015 Document Reviewed: 11/19/2014 Elsevier Interactive Patient  Education  2017 [employer].

## 2016-03-07 ENCOUNTER — Encounter (HOSPITAL_COMMUNITY): Payer: Self-pay | Admitting: Internal Medicine

## 2016-03-21 DIAGNOSIS — G4733 Obstructive sleep apnea (adult) (pediatric): Secondary | ICD-10-CM | POA: Diagnosis not present

## 2016-04-12 DIAGNOSIS — Z1389 Encounter for screening for other disorder: Secondary | ICD-10-CM | POA: Diagnosis not present

## 2016-04-12 DIAGNOSIS — E669 Obesity, unspecified: Secondary | ICD-10-CM | POA: Diagnosis not present

## 2016-04-12 DIAGNOSIS — E038 Other specified hypothyroidism: Secondary | ICD-10-CM | POA: Diagnosis not present

## 2016-04-12 DIAGNOSIS — G4730 Sleep apnea, unspecified: Secondary | ICD-10-CM | POA: Diagnosis not present

## 2016-04-12 DIAGNOSIS — D509 Iron deficiency anemia, unspecified: Secondary | ICD-10-CM | POA: Diagnosis not present

## 2016-04-12 DIAGNOSIS — M2550 Pain in unspecified joint: Secondary | ICD-10-CM | POA: Diagnosis not present

## 2016-04-18 ENCOUNTER — Other Ambulatory Visit: Payer: Self-pay | Admitting: *Deleted

## 2016-04-18 MED ORDER — ZOLPIDEM TARTRATE 10 MG PO TABS: 10.0000 mg | ORAL_TABLET | Freq: Every evening | ORAL | 1 refills | Status: DC | PRN

## 2016-04-18 NOTE — Telephone Encounter (Signed)
May have this +5 refills 

## 2016-04-21 DIAGNOSIS — G4733 Obstructive sleep apnea (adult) (pediatric): Secondary | ICD-10-CM | POA: Diagnosis not present

## 2016-04-25 ENCOUNTER — Telehealth: Payer: Self-pay | Admitting: *Deleted

## 2016-04-25 ENCOUNTER — Other Ambulatory Visit: Payer: Self-pay | Admitting: *Deleted

## 2016-04-25 NOTE — Telephone Encounter (Signed)
See previous phone message. Pt notified and copy of approval faxed to [facility].

## 2016-04-25 NOTE — Telephone Encounter (Signed)
Fax from [employer]. ambien 10mg  #90 every 90 days approved. Valid 02/21/16 - 04/21/2017

## 2016-04-26 ENCOUNTER — Telehealth: Payer: Self-pay | Admitting: Family Medicine

## 2016-04-26 ENCOUNTER — Other Ambulatory Visit: Payer: Self-pay | Admitting: *Deleted

## 2016-04-26 DIAGNOSIS — Z86010 Personal history of colonic polyps: Secondary | ICD-10-CM | POA: Insufficient documentation

## 2016-04-26 MED ORDER — ZOLPIDEM TARTRATE 10 MG PO TABS: 10.0000 mg | ORAL_TABLET | Freq: Every evening | ORAL | 1 refills | Status: DC | PRN

## 2016-04-26 NOTE — Telephone Encounter (Signed)
done

## 2016-04-26 NOTE — Telephone Encounter (Signed)
The pt called to give the nurse the fax number to [facility] that Abigail Butts requested yesterday. The fax number is: [phone].

## 2016-05-03 ENCOUNTER — Ambulatory Visit (INDEPENDENT_AMBULATORY_CARE_PROVIDER_SITE_OTHER): Payer: Federal, State, Local not specified - PPO | Admitting: Neurology

## 2016-05-03 ENCOUNTER — Encounter: Payer: Self-pay | Admitting: Neurology

## 2016-05-03 VITALS — BP 131/77 | HR 91 | Resp 16 | Ht 64.0 in | Wt 244.0 lb

## 2016-05-03 DIAGNOSIS — G2581 Restless legs syndrome: Secondary | ICD-10-CM

## 2016-05-03 DIAGNOSIS — Z860100 Personal history of colon polyps, unspecified: Secondary | ICD-10-CM

## 2016-05-03 DIAGNOSIS — G4700 Insomnia, unspecified: Secondary | ICD-10-CM

## 2016-05-03 DIAGNOSIS — D509 Iron deficiency anemia, unspecified: Secondary | ICD-10-CM | POA: Diagnosis not present

## 2016-05-03 MED ORDER — GABAPENTIN 400 MG PO CAPS: ORAL_CAPSULE | ORAL | 11 refills | Status: DC

## 2016-05-03 NOTE — Progress Notes (Signed)
[facility]  PATIENT: Karen Vazquez DOB: [id number]  REFERRING DOCTOR OR PCP:  Sallee Lange SOURCE: Patient, Notes from Dr. Wolfgang Phoenix, laboratory results  _________________________________   HISTORICAL  CHIEF COMPLAINT:  Chief Complaint  Patient presents with  . Restless Leg Syndrome    Marcheta is here for eval of RLS.  Sts. she has been on Requip for yrs., and it is becoming less effective, with RLS sx. now also during the day.  Fe was low, so she is on rx. Fe and Vit D3.  She would like to disucss other tx. options/fim    HISTORY OF PRESENT ILLNESS:  I had the pleasure of seeing your patient, Karen Vazquez, for a neurologic consultation regarding her restless leg syndrome.     Her RLS symptoms started about 10 years ago while on a red eye flight from [facility].   She had a severe crawly sensation in her legs was very uncomfortable. When she moves around at the end of the flight she felt much better. Since then, she has noted systemic sensations in her legs when she lays down to go to bed when she is not moving much. This occurs, if she gets out of bed or walks or if she moves her legs she will feel better.  Shortly after her flight when symptoms persisted, she saw her PCP and was started on Requip.   Requip helped but the dose needed to be increased form 2 mg to 4 mg last year.   Even at the higher dose, she is still getting symptoms at night every night and sometimes during the day when immobilized (car ride or bus ride or laying on couch watching TV).  Xanax has helped during the day when she is going to travel.    Symptoms have been much worse the past 6 months. Of note, symptoms were also worse in 2011 when she had anemia following total knee replacement. She has never been on gabapentin or Lyrica.    She has had low ferritin over the past year and has had a hemoglobin that has dropped from 13 to 8.1 over the past year and a half (11/10/14 to 03/2016).  Ears, her  highest ferritin has been low normal at 15 ferritin level was only 6.1.   Stool heme tests were never positive.   She had a colonoscopy in December 2017 showing one polyp (also had one polyp in 2010) which was removed.     She used to donate blood quarterly but stopped last year because of a low hemoglobin.    She needed a blood transfusion in 2011 when she had anemia following TKR.      She also reports numbness in both knees and has a patch of numbness bilaterally, worse on the left since a fall  She has had some sleep issues. Besides insomnia possibly related to her restless leg syndrome, she has OSA and uses CPAP. She notes both sleep onset and sleep maintenance insomnia. Ambien has helped her fall asleep but she still wakes up a couple times most nights. Sometimes she has trouble falling back asleep.  REVIEW OF SYSTEMS: Constitutional: No fevers, chills, sweats, or change in appetite Eyes: No visual changes, double vision, eye pain Ear, nose and throat: No hearing loss, ear pain, nasal congestion, sore throat Cardiovascular: No chest pain, palpitations Respiratory: No shortness of breath at rest or with exertion.   No wheezes.   Has OSA GastrointestinaI: No nausea, vomiting, diarrhea, abdominal pain,  fecal incontinence.   Has had polyps x 2.  Genitourinary: No dysuria, urinary retention or frequency.  No nocturia. Musculoskeletal: No neck pain, back pain Integumentary: No rash, pruritus, skin lesions Neurological: as above Psychiatric: No depression at this time.  No anxiety Endocrine: No palpitations, diaphoresis, change in appetite, change in weigh or increased thirst Hematologic/Lymphatic: No purpura, petechiae.  She has anemia Allergic/Immunologic: No itchy/runny eyes, nasal congestion, recent allergic reactions, rashes  ALLERGIES: Allergies  Allergen Reactions  . Lisinopril Cough    HOME MEDICATIONS:  Current Outpatient Prescriptions:  .  ALPRAZolam (XANAX) 0.5 MG tablet,  Take 1 tablet (0.5 mg total) by mouth 2 (two) times daily as needed for anxiety or sleep., Disp: 30 tablet, Rfl: 2 .  amLODipine (NORVASC) 2.5 MG tablet, Take 1 tablet (2.5 mg total) by mouth daily., Disp: 30 tablet, Rfl: 5 .  ARMOUR THYROID 15 MG tablet, TAKE ONE (1) TABLET EACH DAY, Disp: 30 tablet, Rfl: 5 .  ARMOUR THYROID 30 MG tablet, TAKE ONE (1) TABLET EACH DAY, Disp: 30 tablet, Rfl: 2 .  Cholecalciferol (VITAMIN D3) 2000 units TABS, Take 4,000 Units by mouth., Disp: , Rfl:  .  iron polysaccharides (POLY-IRON 150) 150 MG capsule, Take 150 mg by mouth 2 (two) times daily., Disp: , Rfl:  .  oxybutynin (DITROPAN XL) 5 MG 24 hr tablet, Take 1 tablet (5 mg total) by mouth at bedtime. (Patient taking differently: Take 5 mg by mouth daily as needed. ), Disp: 30 tablet, Rfl: 2 .  rOPINIRole (REQUIP) 4 MG tablet, Take 1 tablet (4 mg total) by mouth every evening., Disp: 30 tablet, Rfl: 5 .  zolpidem (AMBIEN) 10 MG tablet, Take 1 tablet (10 mg total) by mouth at bedtime as needed for sleep., Disp: 90 tablet, Rfl: 1 .  gabapentin (NEURONTIN) 400 MG capsule, Take one pill at dinner and 1-2 pills po at bedtime, Disp: 90 capsule, Rfl: 11  PAST MEDICAL HISTORY: Past Medical History:  Diagnosis Date  . Arthritis   . Blood transfusion 2011   at [facility]  . Hypertension   . Hypothyroidism   . Restless leg syndrome   . Sleep apnea    does not use c-pap machine    PAST SURGICAL HISTORY: Past Surgical History:  Procedure Laterality Date  . APPENDECTOMY    . BLADDER SUSPENSION  12/29/2010   Procedure: TRANSVAGINAL TAPE (TVT) PROCEDURE;  Surgeon: Daria Pastures;  Location: [facility];  Service: Gynecology;  Laterality: N/A;  . BREAST SURGERY     left breast lumpectomy 1977  . COLONOSCOPY N/A 03/03/2016   Procedure: COLONOSCOPY;  Surgeon: Rogene Houston, MD;  Location: [facility];  Service: Endoscopy;  Laterality: N/A;  2:00 - moved to 12/14 @ 12:55 - Ann notified pt  . CYSTOCELE REPAIR  12/29/2010     Procedure: ANTERIOR REPAIR (CYSTOCELE);  Surgeon: Daria Pastures;  Location: [facility];  Service: Gynecology;  Laterality: N/A;  . CYSTOSCOPY  12/29/2010   Procedure: CYSTOSCOPY;  Surgeon: Daria Pastures;  Location: [facility];  Service: Gynecology;  Laterality: N/A;  . JOINT REPLACEMENT  2011   rt knee  . REPLACEMENT TOTAL KNEE Right 2011  . TONSILLECTOMY    . TOTAL KNEE ARTHROPLASTY Left 11/17/2014   Procedure: LEFT TOTAL KNEE ARTHROPLASTY;  Surgeon: Gaynelle Arabian, MD;  Location: [facility];  Service: Orthopedics;  Laterality: Left;  . TUBAL LIGATION    . VAGINAL HYSTERECTOMY  12/29/2010   Procedure: HYSTERECTOMY VAGINAL;  Surgeon: Doyne Keel  Philis Pique;  Location: [facility];  Service: Gynecology;  Laterality: N/A;    FAMILY HISTORY: Family History  Problem Relation Age of Onset  . Stroke Mother   . Hypertension Mother   . Hyperlipidemia Mother   . Heart disease Father   . COPD Father   . Cancer Paternal Grandmother     breast  . Diabetes Paternal Grandfather     SOCIAL HISTORY:  Social History   Social History  . Marital status: Married    Spouse name: N/A  . Number of children: N/A  . Years of education: N/A   Occupational History  . Not on file.   Social History Main Topics  . Smoking status: Never Smoker  . Smokeless tobacco: Never Used  . Alcohol use No  . Drug use: No  . Sexual activity: Yes    Birth control/ protection: Post-menopausal   Other Topics Concern  . Not on file   Social History Narrative  . No narrative on file     PHYSICAL EXAM  Vitals:   05/03/16 0848  BP: 131/77  Pulse: 91  Resp: 16  Weight: 244 lb (110.7 kg)  Height: 5\' 4"  (1.626 m)    Body mass index is 41.88 kg/m.   General: The patient is well-developed and well-nourished and in no acute distress  Eyes:  Funduscopic exam shows normal optic discs and retinal vessels.  Neck: The neck is supple, no carotid bruits are noted.  The neck is nontender.  Cardiovascular: The  heart has a regular rate and rhythm with a normal S1 and S2. There were no murmurs, gallops or rubs.    Skin: Extremities are without rash or edema.  Musculoskeletal:  Back is nontender  Neurologic Exam  Mental status: The patient is alert and oriented x 3 at the time of the examination. The patient has apparent normal recent and remote memory, with an apparently normal attention span and concentration ability.   Speech is normal.  Cranial nerves: Extraocular movements are full. Pupils are equal, round, and reactive to light and accomodation.  Visual fields are full.  Facial symmetry is present. There is good facial sensation to soft touch bilaterally.Facial strength is normal.  Trapezius and sternocleidomastoid strength is normal. No dysarthria is noted.  The tongue is midline, and the patient has symmetric elevation of the soft palate. No obvious hearing deficits are noted.  Motor:  Muscle bulk is normal.   Tone is normal. Strength is  5 / 5 in all 4 extremities.   Sensory: Sensory testing is intact to pinprick, soft touch and vibration sensation in The arms. She has normal touch and temperature sensation in the legs. There is about a 50% reduction of vibratory sensation in her toes but normal sensation at the ankles.  Coordination: Cerebellar testing reveals good finger-nose-finger and heel-to-shin bilaterally.  Gait and station: Station is normal.   Gait is normal. Tandem gait is normal. Romberg is negative.   Reflexes: Deep tendon reflexes are symmetric and normal bilaterally.   Plantar responses are flexor.    DIAGNOSTIC DATA (LABS, IMAGING, TESTING) - I reviewed patient records, labs, notes, testing and imaging myself where available.  Lab Results  Component Value Date   WBC 4.2 08/12/2015   HGB 11.2 (L) 11/19/2014   HCT 35.3 08/12/2015   MCV 75 (L) 08/12/2015   PLT 197 08/12/2015      Component Value Date/Time   NA 144 08/12/2015 0926   K 4.7 08/12/2015 0926   CL  103  08/12/2015 0926   CO2 24 08/12/2015 0926   GLUCOSE 88 08/12/2015 0926   GLUCOSE 109 (H) 11/19/2014 0520   BUN 9 08/12/2015 0926   CREATININE 0.95 08/12/2015 0926   CREATININE 0.89 12/12/2012 0813   CALCIUM 9.5 08/12/2015 0926   PROT 6.6 11/10/2014 0850   ALBUMIN 3.9 11/10/2014 0850   AST 19 11/10/2014 0850   ALT 15 11/10/2014 0850   ALKPHOS 101 11/10/2014 0850   BILITOT 0.8 11/10/2014 0850   GFRNONAA 65 08/12/2015 0926   GFRAA 75 08/12/2015 0926   Lab Results  Component Value Date   CHOL 153 08/12/2015   HDL 36 (L) 08/12/2015   LDLCALC 94 08/12/2015   TRIG 115 08/12/2015   CHOLHDL 4.3 08/12/2015   Lab Results  Component Value Date   HGBA1C 5.4 06/12/2014   No results found for: VITAMINB12 Lab Results  Component Value Date   TSH 2.320 08/12/2015       ASSESSMENT AND PLAN  Restless legs syndrome  Iron deficiency anemia, unspecified iron deficiency anemia type  History of colonic polyps  Insomnia, unspecified type    In summary, Mrs. Gallick is a 61 year old woman with restless leg syndrome that has worsened over the past year. Concurrent with the worsening of her restless leg syndrome, she has had lower hemoglobin and lower ferritin. Therefore, the iron deficiency is likely playing a role in her restless leg syndrome. She is currently on oral iron supplementation.  In a couple months if not doing any better and ferritin is not into the normal range, consider referral to hematology for further anemia evaluation. Colonoscopy showed one polyp but Hemoccult cards have been negative in the past.   To help with the RLS symptoms I will start gabapentin at a dose of 400 mg at dinner and 400-800 mg at night. Hopefully this will help her symptoms and help her sleep maintenance as well.  On examination, he had mild vibration sensation loss at the toes but normal sensation elsewhere. This is unlikely to be playing a role in her RLS but next time blood is checked we can also  determine if the vitamin B12 is low and supplement if needed.    She will return to see me in 3 months or sooner if there are new or worsening symptoms. If not already done so by that time, we will recheck some blood work.  Thank you for asking me to see Mrs. Ishmael for neurologic consultation. Please let me know if I can be of further assistance with her or other patients in the future.  Cloyce Blankenhorn A. Felecia Shelling, MD, PhD [id number], [id number] AM Certified in Neurology, Clinical Neurophysiology, Sleep Medicine, Pain Medicine and Neuroimaging  [facility] [address] [place], [place] [address] [phone]

## 2016-05-19 DIAGNOSIS — G4733 Obstructive sleep apnea (adult) (pediatric): Secondary | ICD-10-CM | POA: Diagnosis not present

## 2016-06-15 DIAGNOSIS — Z471 Aftercare following joint replacement surgery: Secondary | ICD-10-CM | POA: Diagnosis not present

## 2016-06-15 DIAGNOSIS — Z96652 Presence of left artificial knee joint: Secondary | ICD-10-CM | POA: Diagnosis not present

## 2016-06-19 DIAGNOSIS — G4733 Obstructive sleep apnea (adult) (pediatric): Secondary | ICD-10-CM | POA: Diagnosis not present

## 2016-06-29 ENCOUNTER — Ambulatory Visit: Payer: Federal, State, Local not specified - PPO | Admitting: Family Medicine

## 2016-06-30 ENCOUNTER — Encounter: Payer: Self-pay | Admitting: Family Medicine

## 2016-06-30 ENCOUNTER — Ambulatory Visit (INDEPENDENT_AMBULATORY_CARE_PROVIDER_SITE_OTHER): Payer: Federal, State, Local not specified - PPO | Admitting: Family Medicine

## 2016-06-30 VITALS — BP 124/72 | Ht 64.0 in | Wt 243.0 lb

## 2016-06-30 DIAGNOSIS — R000 Tachycardia, unspecified: Secondary | ICD-10-CM

## 2016-06-30 DIAGNOSIS — G2581 Restless legs syndrome: Secondary | ICD-10-CM | POA: Diagnosis not present

## 2016-06-30 DIAGNOSIS — I10 Essential (primary) hypertension: Secondary | ICD-10-CM

## 2016-06-30 DIAGNOSIS — R0602 Shortness of breath: Secondary | ICD-10-CM

## 2016-06-30 DIAGNOSIS — R7989 Other specified abnormal findings of blood chemistry: Secondary | ICD-10-CM | POA: Diagnosis not present

## 2016-06-30 DIAGNOSIS — D509 Iron deficiency anemia, unspecified: Secondary | ICD-10-CM | POA: Diagnosis not present

## 2016-06-30 LAB — POCT HEMOGLOBIN: Hemoglobin: 9.1 g/dL — AB (ref 12.2–16.2)

## 2016-06-30 MED ORDER — ARMOUR THYROID 15 MG PO TABS: ORAL_TABLET | ORAL | 5 refills | Status: DC

## 2016-06-30 MED ORDER — ROPINIROLE HCL 4 MG PO TABS: 4.0000 mg | ORAL_TABLET | Freq: Every evening | ORAL | 5 refills | Status: DC

## 2016-06-30 MED ORDER — AMLODIPINE BESYLATE 2.5 MG PO TABS: 2.5000 mg | ORAL_TABLET | Freq: Every day | ORAL | 5 refills | Status: DC

## 2016-06-30 MED ORDER — OXYBUTYNIN CHLORIDE ER 5 MG PO TB24: 5.0000 mg | ORAL_TABLET | Freq: Every day | ORAL | 5 refills | Status: DC | PRN

## 2016-06-30 MED ORDER — ARMOUR THYROID 30 MG PO TABS: ORAL_TABLET | ORAL | 6 refills | Status: DC

## 2016-06-30 NOTE — Progress Notes (Signed)
   Subjective:    Patient ID: Karen Vazquez, female    DOB: [birth year], 61 y.o.   MRN: [id number]  Hypertension  This is a chronic problem. The current episode started more than 1 year ago. Associated symptoms include palpitations and shortness of breath. Pertinent negatives include no chest pain. Treatments tried: amlodipine. Compliance problems include exercise (joined weight watchers last week).    Having shortness of breath and tachycardia with activity. Started about one month ago.  Patient relates some shortness of breath with activity occasional palpitations no chest tightness pressure pain Patient denies any chest pain denies syncope denies swelling in the legs. Does have restless legs. Also has iron deficient anemia does not tolerate iron tablets Review of Systems  Constitutional: Negative for activity change, appetite change and fatigue.  HENT: Negative for congestion.   Respiratory: Positive for shortness of breath. Negative for cough.   Cardiovascular: Positive for palpitations. Negative for chest pain.  Gastrointestinal: Negative for abdominal pain.  Endocrine: Negative for polydipsia and polyphagia.  Neurological: Negative for weakness.  Psychiatric/Behavioral: Negative for confusion.       Objective:   Physical Exam  Constitutional: She appears well-nourished. No distress.  Cardiovascular: Normal rate, regular rhythm and normal heart sounds.   No murmur heard. Pulmonary/Chest: Effort normal and breath sounds normal. No respiratory distress.  Musculoskeletal: She exhibits no edema.  Lymphadenopathy:    She has no cervical adenopathy.  Neurological: She is alert. She exhibits normal muscle tone.  Psychiatric: Her behavior is normal.  Vitals reviewed.     25 minutes was spent with the patient. Greater than half the time was spent in discussion and answering questions and counseling regarding the issues that the patient came in for today.     Assessment & Plan:    Overactive bladder uses did trip and when necessary  Blood pressure good control continue current measures watch diet try lose weight patient is doing weight watchers  Restless legs continue current medication could be aren't deficient anemia contributing to this we will do some lab work patient may need our infusion she did not tolerate aren't pills  Insomnia Ambien when necessary  Occasional anxiousness Xanax only for home use caution drowsiness  Thyroid per Dr. Forde Dandy endocrinology [place] we will sign for the records to get the lab work.  Patient relates an occasional palpitation EKG looks good previous heart workup less than 2 years ago was good I do not feel patient is suffering with any type of coronary artery disease-I feel her shortness of breath is probably associated with anemia from the aren't deficient anemia but we will check lab work to look at BNP d-dimer.

## 2016-07-01 ENCOUNTER — Telehealth: Payer: Self-pay | Admitting: *Deleted

## 2016-07-01 ENCOUNTER — Ambulatory Visit (HOSPITAL_COMMUNITY)
Admission: RE | Admit: 2016-07-01 | Discharge: 2016-07-01 | Disposition: A | Discharge status: Home / Self Care | Payer: Federal, State, Local not specified - PPO | Source: Ambulatory Visit | Attending: Family Medicine | Admitting: Family Medicine

## 2016-07-01 DIAGNOSIS — R7989 Other specified abnormal findings of blood chemistry: Secondary | ICD-10-CM | POA: Insufficient documentation

## 2016-07-01 DIAGNOSIS — R0602 Shortness of breath: Secondary | ICD-10-CM | POA: Insufficient documentation

## 2016-07-01 LAB — CBC WITH DIFFERENTIAL/PLATELET
BASOS: 0 %
Basophils Absolute: 0 10*3/uL (ref 0.0–0.2)
EOS (ABSOLUTE): 0.1 10*3/uL (ref 0.0–0.4)
EOS: 2 %
HEMATOCRIT: 30.4 % — AB (ref 34.0–46.6)
Hemoglobin: 8.6 g/dL — ABNORMAL LOW (ref 11.1–15.9)
IMMATURE GRANS (ABS): 0 10*3/uL (ref 0.0–0.1)
Immature Granulocytes: 0 %
LYMPHS: 24 %
Lymphocytes Absolute: 1.4 10*3/uL (ref 0.7–3.1)
MCH: 18.2 pg — AB (ref 26.6–33.0)
MCHC: 28.3 g/dL — ABNORMAL LOW (ref 31.5–35.7)
MCV: 64 fL — AB (ref 79–97)
MONOS ABS: 0.4 10*3/uL (ref 0.1–0.9)
Monocytes: 7 %
NEUTROS ABS: 3.8 10*3/uL (ref 1.4–7.0)
NEUTROS PCT: 67 %
Platelets: 251 10*3/uL (ref 150–379)
RBC: 4.72 x10E6/uL (ref 3.77–5.28)
RDW: 18.3 % — AB (ref 12.3–15.4)
WBC: 5.8 10*3/uL (ref 3.4–10.8)

## 2016-07-01 LAB — BRAIN NATRIURETIC PEPTIDE: BNP: 17.4 pg/mL (ref 0.0–100.0)

## 2016-07-01 LAB — D-DIMER, QUANTITATIVE: D-DIMER: 0.8 mg/L FEU — ABNORMAL HIGH (ref 0.00–0.49)

## 2016-07-01 LAB — BASIC METABOLIC PANEL
BUN/Creatinine Ratio: 11 — ABNORMAL LOW (ref 12–28)
BUN: 9 mg/dL (ref 8–27)
CO2: 25 mmol/L (ref 18–29)
CREATININE: 0.85 mg/dL (ref 0.57–1.00)
Calcium: 9.4 mg/dL (ref 8.7–10.3)
Chloride: 100 mmol/L (ref 96–106)
GFR, EST AFRICAN AMERICAN: 86 mL/min/{1.73_m2} (ref >59)
GFR, EST NON AFRICAN AMERICAN: 74 mL/min/{1.73_m2} (ref >59)
Glucose: 89 mg/dL (ref 65–99)
POTASSIUM: 4.4 mmol/L (ref 3.5–5.2)
SODIUM: 140 mmol/L (ref 134–144)

## 2016-07-01 LAB — FERRITIN: Ferritin: 7 ng/mL — ABNORMAL LOW (ref 15–150)

## 2016-07-01 LAB — IRON AND TIBC
Iron Saturation: 4 % — CL (ref 15–55)
Iron: 18 ug/dL — ABNORMAL LOW (ref 27–139)
Total Iron Binding Capacity: 497 ug/dL — ABNORMAL HIGH (ref 250–450)
UIBC: 479 ug/dL — AB (ref 118–369)

## 2016-07-01 MED ORDER — IOPAMIDOL (ISOVUE-370) INJECTION 76%
100.0000 mL | Freq: Once | INTRAVENOUS | Status: AC | PRN
  Administered 2016-07-01: 100 mL via INTRAVENOUS

## 2016-07-01 NOTE — Addendum Note (Signed)
Addended by: Dairl Ponder on: 07/01/2016 10:40 AM   Modules accepted: Orders

## 2016-07-01 NOTE — Telephone Encounter (Signed)
No PA required for CT angio chest per Shakemika H at [facility].

## 2016-07-01 NOTE — Addendum Note (Signed)
Addended by: Dairl Ponder on: 07/01/2016 10:09 AM   Modules accepted: Orders

## 2016-07-02 ENCOUNTER — Other Ambulatory Visit: Payer: Self-pay | Admitting: Family Medicine

## 2016-07-02 DIAGNOSIS — D509 Iron deficiency anemia, unspecified: Secondary | ICD-10-CM

## 2016-07-06 ENCOUNTER — Other Ambulatory Visit (INDEPENDENT_AMBULATORY_CARE_PROVIDER_SITE_OTHER): Payer: Federal, State, Local not specified - PPO | Admitting: *Deleted

## 2016-07-06 DIAGNOSIS — D509 Iron deficiency anemia, unspecified: Secondary | ICD-10-CM

## 2016-07-06 LAB — IFOBT (OCCULT BLOOD): IFOBT: NEGATIVE

## 2016-07-11 ENCOUNTER — Encounter (INDEPENDENT_AMBULATORY_CARE_PROVIDER_SITE_OTHER): Payer: Self-pay | Admitting: Internal Medicine

## 2016-07-11 ENCOUNTER — Other Ambulatory Visit (INDEPENDENT_AMBULATORY_CARE_PROVIDER_SITE_OTHER): Payer: Self-pay | Admitting: *Deleted

## 2016-07-11 ENCOUNTER — Ambulatory Visit (INDEPENDENT_AMBULATORY_CARE_PROVIDER_SITE_OTHER): Payer: Federal, State, Local not specified - PPO | Admitting: Internal Medicine

## 2016-07-11 ENCOUNTER — Telehealth (INDEPENDENT_AMBULATORY_CARE_PROVIDER_SITE_OTHER): Payer: Self-pay | Admitting: Internal Medicine

## 2016-07-11 VITALS — BP 170/80 | HR 84 | Temp 97.4°F | Ht 64.0 in | Wt 243.6 lb

## 2016-07-11 DIAGNOSIS — D508 Other iron deficiency anemias: Secondary | ICD-10-CM | POA: Diagnosis not present

## 2016-07-11 NOTE — Progress Notes (Signed)
Subjective:    Patient ID: Karen Vazquez, female    DOB: 09-13-1955, 61 y.o.   MRN: [id number]  HPI Referred by Dr Wolfgang Phoenix for iron transfusion.  Noted recently ferritin was 7.  Hx of IDA. 07/06/2016 FOBT negative.  Hx of anemia in the past. Appetite is good. No weight loss.  Denies seeing any blood in her stools. Last colonoscopy in 2017. Stool card 07/06/2016 negative for blood. Last colonoscopy was in December of 2017.  Has not donated blood in 2 years.    CBC Latest Ref Rng & Units 06/30/2016 08/12/2015 11/19/2014  WBC 3.4 - 10.8 x10E3/uL 5.8 4.2 11.6(H)  Hemoglobin 12.2 - 16.2 g/dL 9.1(A) - 11.2(L)  Hematocrit 34.0 - 46.6 % 30.4(L) 35.3 34.2(L)  Platelets 150 - 379 x10E3/uL 251 197 184  06/30/2016 Hemoglobin 8.6.     03/03/2016 Colonoscopy: Personal hx of colon polyps.  Impression:               - One small polyp in the distal sigmoid colon,                            removed with a cold snare. Resected and retrieved.                           - External hemorrhoids. Biopsy: tubular adenoma    Review of Systems Past Medical History:  Diagnosis Date  . Arthritis   . Blood transfusion 2011   at [facility]  . Hypertension   . Hypothyroidism   . Restless leg syndrome   . Sleep apnea    does not use c-pap machine    Past Surgical History:  Procedure Laterality Date  . APPENDECTOMY    . BLADDER SUSPENSION  12/29/2010   Procedure: TRANSVAGINAL TAPE (TVT) PROCEDURE;  Surgeon: Daria Pastures;  Location: [facility];  Service: Gynecology;  Laterality: N/A;  . BREAST SURGERY     left breast lumpectomy 1977  . COLONOSCOPY N/A 03/03/2016   Procedure: COLONOSCOPY;  Surgeon: Rogene Houston, MD;  Location: [facility];  Service: Endoscopy;  Laterality: N/A;  2:00 - moved to 12/14 @ 12:55 - Ann notified pt  . CYSTOCELE REPAIR  12/29/2010   Procedure: ANTERIOR REPAIR (CYSTOCELE);  Surgeon: Daria Pastures;  Location: [facility];  Service: Gynecology;  Laterality: N/A;  . CYSTOSCOPY   12/29/2010   Procedure: CYSTOSCOPY;  Surgeon: Daria Pastures;  Location: [facility];  Service: Gynecology;  Laterality: N/A;  . JOINT REPLACEMENT  2011   rt knee  . REPLACEMENT TOTAL KNEE Right 2011  . TONSILLECTOMY    . TOTAL KNEE ARTHROPLASTY Left 11/17/2014   Procedure: LEFT TOTAL KNEE ARTHROPLASTY;  Surgeon: Gaynelle Arabian, MD;  Location: [facility];  Service: Orthopedics;  Laterality: Left;  . TUBAL LIGATION    . VAGINAL HYSTERECTOMY  12/29/2010   Procedure: HYSTERECTOMY VAGINAL;  Surgeon: Daria Pastures;  Location: [facility];  Service: Gynecology;  Laterality: N/A;    Allergies  Allergen Reactions  . Lisinopril Cough    Current Outpatient Prescriptions on File Prior to Visit  Medication Sig Dispense Refill  . ALPRAZolam (XANAX) 0.5 MG tablet Take 1 tablet (0.5 mg total) by mouth 2 (two) times daily as needed for anxiety or sleep. 30 tablet 2  . amLODipine (NORVASC) 2.5 MG tablet Take 1 tablet (2.5 mg total) by mouth daily. 30 tablet 5  .  ARMOUR THYROID 15 MG tablet TAKE ONE (1) TABLET EACH DAY 30 tablet 5  . ARMOUR THYROID 30 MG tablet TAKE ONE (1) TABLET EACH DAY 30 tablet 6  . Cholecalciferol (VITAMIN D3) 2000 units TABS Take 4,000 Units by mouth.    . oxybutynin (DITROPAN XL) 5 MG 24 hr tablet Take 1 tablet (5 mg total) by mouth daily as needed. 30 tablet 5  . rOPINIRole (REQUIP) 4 MG tablet Take 1 tablet (4 mg total) by mouth every evening. 30 tablet 5  . zolpidem (AMBIEN) 10 MG tablet Take 1 tablet (10 mg total) by mouth at bedtime as needed for sleep. 90 tablet 1   No current facility-administered medications on file prior to visit.        Objective:   Physical Exam   Blood pressure (!) 170/80, pulse 84, temperature 97.4 F (36.3 C), height 5\' 4"  (1.626 m), weight 243 lb 9.6 oz (110.5 kg),   Alert and oriented. Skin warm and dry. Oral mucosa is moist.   . Sclera anicteric, conjunctivae is pink. Thyroid not enlarged. No cervical lymphadenopathy. Lungs clear. Heart regular  rate and rhythm.  Abdomen is soft. Bowel sounds are positive. No hepatomegaly. No abdominal masses felt. No tenderness.  No edema to lower extremities.        Assessment & Plan:  IDA. Three stool cards home with patient.  Iron transfusion.  3 stool cards home with patient.

## 2016-07-11 NOTE — Patient Instructions (Signed)
Will set up iron infusion

## 2016-07-12 NOTE — Telephone Encounter (Signed)
err

## 2016-07-19 DIAGNOSIS — G4733 Obstructive sleep apnea (adult) (pediatric): Secondary | ICD-10-CM | POA: Diagnosis not present

## 2016-07-20 ENCOUNTER — Encounter (HOSPITAL_COMMUNITY): Payer: Self-pay

## 2016-07-20 ENCOUNTER — Encounter (HOSPITAL_COMMUNITY)
Admission: RE | Admit: 2016-07-20 | Discharge: 2016-07-20 | Disposition: A | Discharge status: Home / Self Care | Payer: Federal, State, Local not specified - PPO | Source: Ambulatory Visit | Attending: Internal Medicine | Admitting: Internal Medicine

## 2016-07-20 DIAGNOSIS — D508 Other iron deficiency anemias: Secondary | ICD-10-CM | POA: Diagnosis not present

## 2016-07-20 MED ORDER — SODIUM CHLORIDE 0.9 % IV SOLN
Freq: Once | INTRAVENOUS | Status: AC
  Administered 2016-07-20: 250 mL via INTRAVENOUS

## 2016-07-20 MED ORDER — SODIUM CHLORIDE 0.9 % IV SOLN
510.0000 mg | INTRAVENOUS | Status: DC
  Administered 2016-07-20: 510 mg via INTRAVENOUS
  Filled 2016-07-20: qty 17

## 2016-07-20 NOTE — Progress Notes (Signed)
1848 Good blood return noted before and after administration of Feraheme.  Site clean, dry, and intact with no s/s of bruising or swelling at site.  No complaints voiced.  Discharged in stable condition.

## 2016-07-20 NOTE — Discharge Instructions (Signed)

## 2016-07-27 ENCOUNTER — Encounter (HOSPITAL_COMMUNITY)
Admission: RE | Admit: 2016-07-27 | Discharge: 2016-07-27 | Disposition: A | Discharge status: Home / Self Care | Payer: Federal, State, Local not specified - PPO | Source: Ambulatory Visit | Attending: Internal Medicine | Admitting: Internal Medicine

## 2016-07-27 DIAGNOSIS — D508 Other iron deficiency anemias: Secondary | ICD-10-CM | POA: Diagnosis not present

## 2016-07-27 MED ORDER — SODIUM CHLORIDE 0.9 % IV SOLN
510.0000 mg | Freq: Once | INTRAVENOUS | Status: AC
  Administered 2016-07-27: 510 mg via INTRAVENOUS
  Filled 2016-07-27: qty 17

## 2016-07-27 MED ORDER — SODIUM CHLORIDE 0.9 % IV SOLN
INTRAVENOUS | Status: DC
  Administered 2016-07-27: 09:00:00 via INTRAVENOUS

## 2016-08-03 ENCOUNTER — Encounter (HOSPITAL_COMMUNITY)
Admission: RE | Admit: 2016-08-03 | Discharge: 2016-08-03 | Disposition: A | Discharge status: Home / Self Care | Payer: Federal, State, Local not specified - PPO | Source: Ambulatory Visit | Attending: Internal Medicine | Admitting: Internal Medicine

## 2016-08-03 ENCOUNTER — Encounter (HOSPITAL_COMMUNITY): Payer: Self-pay

## 2016-08-03 DIAGNOSIS — D508 Other iron deficiency anemias: Secondary | ICD-10-CM | POA: Diagnosis not present

## 2016-08-03 LAB — HEMOGLOBIN AND HEMATOCRIT, BLOOD
HEMATOCRIT: 37 % (ref 36.0–46.0)
HEMOGLOBIN: 10.7 g/dL — AB (ref 12.0–15.0)

## 2016-08-03 NOTE — Progress Notes (Signed)
Results for Karen Vazquez, Karen Vazquez (MRN [id number]) as of 08/03/2016 13:50  Ref. Range 08/03/2016 12:05  Hemoglobin Latest Ref Range: 12.0 - 15.0 g/dL 10.7 (L)  HCT Latest Ref Range: 36.0 - 46.0 % 37.0    Results of H&H one week after 2 iron infusions.

## 2016-08-04 ENCOUNTER — Telehealth (INDEPENDENT_AMBULATORY_CARE_PROVIDER_SITE_OTHER): Payer: Self-pay | Admitting: *Deleted

## 2016-08-04 NOTE — Telephone Encounter (Signed)
Patient called requesting her lab results. Please advise [phone]

## 2016-08-04 NOTE — Telephone Encounter (Signed)
Patient called and given the result of her Hgb- 10.7 drawn on 08/03/2016. She was advised that Dr.Rehman would review it and any further recommendations that the doctor would give her a call. Patient states that she was ask yesterday at the hospital, if she would be getting anymore infusions.

## 2016-08-10 ENCOUNTER — Ambulatory Visit: Payer: Federal, State, Local not specified - PPO | Admitting: Neurology

## 2016-08-12 ENCOUNTER — Telehealth (INDEPENDENT_AMBULATORY_CARE_PROVIDER_SITE_OTHER): Payer: Self-pay | Admitting: Internal Medicine

## 2016-08-12 NOTE — Telephone Encounter (Signed)
Patient called, stated that she needs to know if she needs anymore iron infusions.  She stated that her hemoglobin as come up some, but not enough, it was checked last week.  She stated that she hasn't heard anything and would appreciate a call.  I called the patient back and let her know that Karna Christmas would be back on Tuesday to address this.  [phone]

## 2016-08-16 ENCOUNTER — Telehealth (INDEPENDENT_AMBULATORY_CARE_PROVIDER_SITE_OTHER): Payer: Self-pay | Admitting: Internal Medicine

## 2016-08-16 ENCOUNTER — Other Ambulatory Visit (INDEPENDENT_AMBULATORY_CARE_PROVIDER_SITE_OTHER): Payer: Self-pay | Admitting: Internal Medicine

## 2016-08-16 DIAGNOSIS — D500 Iron deficiency anemia secondary to blood loss (chronic): Secondary | ICD-10-CM

## 2016-08-16 DIAGNOSIS — D508 Other iron deficiency anemias: Secondary | ICD-10-CM

## 2016-08-16 NOTE — Telephone Encounter (Signed)
Patient will go to lab and have ferritin drawn

## 2016-08-16 NOTE — Telephone Encounter (Signed)
Call returned. Patient advised begin Flintstone chewable vitamin 2 tablets a day. She will have serum ferritin and H&H next week. We have not proven that she is losing blood from GI tract. She does not have any symptoms to suggest celiac disease. Further recommendations to follow.

## 2016-08-19 DIAGNOSIS — G4733 Obstructive sleep apnea (adult) (pediatric): Secondary | ICD-10-CM | POA: Diagnosis not present

## 2016-08-30 DIAGNOSIS — D500 Iron deficiency anemia secondary to blood loss (chronic): Secondary | ICD-10-CM | POA: Diagnosis not present

## 2016-08-30 LAB — FERRITIN: FERRITIN: 18 ng/mL — AB (ref 20–288)

## 2016-08-31 ENCOUNTER — Other Ambulatory Visit (INDEPENDENT_AMBULATORY_CARE_PROVIDER_SITE_OTHER): Payer: Self-pay | Admitting: *Deleted

## 2016-08-31 DIAGNOSIS — D508 Other iron deficiency anemias: Secondary | ICD-10-CM

## 2016-09-14 ENCOUNTER — Encounter (INDEPENDENT_AMBULATORY_CARE_PROVIDER_SITE_OTHER): Payer: Self-pay | Admitting: *Deleted

## 2016-09-14 ENCOUNTER — Other Ambulatory Visit (INDEPENDENT_AMBULATORY_CARE_PROVIDER_SITE_OTHER): Payer: Self-pay | Admitting: *Deleted

## 2016-09-14 DIAGNOSIS — D508 Other iron deficiency anemias: Secondary | ICD-10-CM

## 2016-09-26 ENCOUNTER — Telehealth (INDEPENDENT_AMBULATORY_CARE_PROVIDER_SITE_OTHER): Payer: Self-pay | Admitting: Internal Medicine

## 2016-09-26 DIAGNOSIS — D508 Other iron deficiency anemias: Secondary | ICD-10-CM | POA: Diagnosis not present

## 2016-09-26 LAB — FERRITIN: FERRITIN: 23 ng/mL (ref 20–288)

## 2016-09-26 LAB — HEMOGLOBIN AND HEMATOCRIT, BLOOD
HCT: 40.5 % (ref 35.0–45.0)
Hemoglobin: 12.8 g/dL (ref 11.7–15.5)

## 2016-09-26 NOTE — Telephone Encounter (Signed)
Tammy, could u schedule Mikaiah for a Iron infusion.   Thanks, Terri.

## 2016-09-26 NOTE — Telephone Encounter (Signed)
Order has been faxed to [facility] for Feraheme 510 mg IV x 2. They will contact the patient of her dates and time.

## 2016-09-28 ENCOUNTER — Encounter (HOSPITAL_COMMUNITY)
Admission: RE | Admit: 2016-09-28 | Discharge: 2016-09-28 | Disposition: A | Discharge status: Home / Self Care | Payer: Federal, State, Local not specified - PPO | Source: Ambulatory Visit | Attending: Internal Medicine | Admitting: Internal Medicine

## 2016-09-28 DIAGNOSIS — D508 Other iron deficiency anemias: Secondary | ICD-10-CM | POA: Diagnosis not present

## 2016-09-28 MED ORDER — SODIUM CHLORIDE 0.9 % IV SOLN
510.0000 mg | Freq: Once | INTRAVENOUS | Status: AC
  Administered 2016-09-28: 510 mg via INTRAVENOUS
  Filled 2016-09-28: qty 17

## 2016-09-28 MED ORDER — SODIUM CHLORIDE 0.9 % IV SOLN
INTRAVENOUS | Status: DC
  Administered 2016-09-28: 12:00:00 via INTRAVENOUS

## 2016-09-29 ENCOUNTER — Other Ambulatory Visit (INDEPENDENT_AMBULATORY_CARE_PROVIDER_SITE_OTHER): Payer: Self-pay | Admitting: *Deleted

## 2016-09-29 DIAGNOSIS — D508 Other iron deficiency anemias: Secondary | ICD-10-CM

## 2016-10-05 ENCOUNTER — Encounter (HOSPITAL_COMMUNITY): Payer: Self-pay

## 2016-10-05 ENCOUNTER — Encounter (HOSPITAL_COMMUNITY)
Admission: RE | Admit: 2016-10-05 | Discharge: 2016-10-05 | Disposition: A | Discharge status: Home / Self Care | Payer: Federal, State, Local not specified - PPO | Source: Ambulatory Visit | Attending: Internal Medicine | Admitting: Internal Medicine

## 2016-10-05 DIAGNOSIS — D508 Other iron deficiency anemias: Secondary | ICD-10-CM | POA: Diagnosis not present

## 2016-10-05 MED ORDER — SODIUM CHLORIDE 0.9 % IV SOLN
510.0000 mg | Freq: Once | INTRAVENOUS | Status: AC
  Administered 2016-10-05: 510 mg via INTRAVENOUS
  Filled 2016-10-05: qty 17

## 2016-10-05 MED ORDER — SODIUM CHLORIDE 0.9 % IV SOLN
Freq: Once | INTRAVENOUS | Status: AC
  Administered 2016-10-05: 250 mL via INTRAVENOUS

## 2016-10-05 NOTE — Discharge Instructions (Signed)

## 2016-10-12 ENCOUNTER — Encounter (HOSPITAL_COMMUNITY): Admission: RE | Admit: 2016-10-12 | Payer: Federal, State, Local not specified - PPO | Source: Ambulatory Visit

## 2016-10-14 ENCOUNTER — Encounter (HOSPITAL_COMMUNITY)
Admission: RE | Admit: 2016-10-14 | Discharge: 2016-10-14 | Disposition: A | Discharge status: Home / Self Care | Payer: Federal, State, Local not specified - PPO | Source: Ambulatory Visit | Attending: Internal Medicine | Admitting: Internal Medicine

## 2016-10-14 DIAGNOSIS — D508 Other iron deficiency anemias: Secondary | ICD-10-CM | POA: Diagnosis not present

## 2016-10-14 LAB — HEMOGLOBIN AND HEMATOCRIT, BLOOD
HEMATOCRIT: 42.8 % (ref 36.0–46.0)
HEMOGLOBIN: 13.8 g/dL (ref 12.0–15.0)

## 2016-10-19 DIAGNOSIS — G4733 Obstructive sleep apnea (adult) (pediatric): Secondary | ICD-10-CM | POA: Diagnosis not present

## 2016-10-20 ENCOUNTER — Telehealth (INDEPENDENT_AMBULATORY_CARE_PROVIDER_SITE_OTHER): Payer: Self-pay | Admitting: Internal Medicine

## 2016-10-20 NOTE — Telephone Encounter (Signed)
Patient called, stated that she had some labs done last Friday at [facility] and had been having iron infusions.  She stated that no one has called her with the results yet and she would like to know what they were.  [phone]

## 2016-10-20 NOTE — Telephone Encounter (Signed)
I called the patient and gave her the results. She states that Dr.Rehman, she thought, didn't want her to have further Iron Infusions until she saw PCP in October. She wants to be sure of this.  Patient will be made aware. 6d ago (10/14/16) 3wk ago (09/26/16) 51mo ago (08/03/16) 33mo ago (06/30/16) 54mo ago (06/30/16) 65yr ago (08/12/15)      Hemoglobin 12.0 - 15.0 g/dL 13.8  12.8R  10.7   8.6R   9.1R   11.4R    HCT 36.0 - 46.0 % 42.8  40.5R  37.0  30.4R    35.3R   Resulting Agency  [facility]    Specimen Collected: 10/14/16 08:20 Last Resulted: 10/14/16 08:30            R=Reference range differs from displayed range

## 2016-10-27 NOTE — Telephone Encounter (Signed)
Per Dr.Rehman , this is correct or if she has lab work and it indicates that it needs to be done. Patient was called and a message was left on her voicemail.

## 2016-11-04 ENCOUNTER — Encounter (INDEPENDENT_AMBULATORY_CARE_PROVIDER_SITE_OTHER): Payer: Self-pay | Admitting: *Deleted

## 2016-11-04 ENCOUNTER — Other Ambulatory Visit (INDEPENDENT_AMBULATORY_CARE_PROVIDER_SITE_OTHER): Payer: Self-pay | Admitting: *Deleted

## 2016-11-04 DIAGNOSIS — D508 Other iron deficiency anemias: Secondary | ICD-10-CM

## 2016-11-19 DIAGNOSIS — G4733 Obstructive sleep apnea (adult) (pediatric): Secondary | ICD-10-CM | POA: Diagnosis not present

## 2016-12-01 DIAGNOSIS — D508 Other iron deficiency anemias: Secondary | ICD-10-CM | POA: Diagnosis not present

## 2016-12-02 LAB — IRON, TOTAL/TOTAL IRON BINDING CAP
%SAT: 25 % (calc) (ref 11–50)
IRON: 78 ug/dL (ref 45–160)
TIBC: 315 mcg/dL (calc) (ref 250–450)

## 2016-12-02 LAB — HEMOGLOBIN AND HEMATOCRIT, BLOOD
HCT: 39.8 % (ref 35.0–45.0)
Hemoglobin: 13.1 g/dL (ref 11.7–15.5)

## 2016-12-02 LAB — FERRITIN: Ferritin: 95 ng/mL (ref 20–288)

## 2016-12-05 ENCOUNTER — Other Ambulatory Visit (INDEPENDENT_AMBULATORY_CARE_PROVIDER_SITE_OTHER): Payer: Self-pay | Admitting: *Deleted

## 2016-12-05 DIAGNOSIS — D508 Other iron deficiency anemias: Secondary | ICD-10-CM

## 2016-12-19 DIAGNOSIS — G4733 Obstructive sleep apnea (adult) (pediatric): Secondary | ICD-10-CM | POA: Diagnosis not present

## 2016-12-29 ENCOUNTER — Ambulatory Visit: Payer: Federal, State, Local not specified - PPO | Admitting: Family Medicine

## 2016-12-30 ENCOUNTER — Ambulatory Visit: Payer: Federal, State, Local not specified - PPO | Admitting: Family Medicine

## 2017-01-02 ENCOUNTER — Ambulatory Visit (INDEPENDENT_AMBULATORY_CARE_PROVIDER_SITE_OTHER): Payer: Federal, State, Local not specified - PPO | Admitting: Family Medicine

## 2017-01-02 ENCOUNTER — Encounter: Payer: Self-pay | Admitting: Family Medicine

## 2017-01-02 VITALS — BP 138/90 | Ht 64.0 in | Wt 241.0 lb

## 2017-01-02 DIAGNOSIS — I10 Essential (primary) hypertension: Secondary | ICD-10-CM | POA: Diagnosis not present

## 2017-01-02 DIAGNOSIS — Z23 Encounter for immunization: Secondary | ICD-10-CM | POA: Diagnosis not present

## 2017-01-02 DIAGNOSIS — Z114 Encounter for screening for human immunodeficiency virus [HIV]: Secondary | ICD-10-CM | POA: Diagnosis not present

## 2017-01-02 DIAGNOSIS — E7849 Other hyperlipidemia: Secondary | ICD-10-CM

## 2017-01-02 DIAGNOSIS — E038 Other specified hypothyroidism: Secondary | ICD-10-CM | POA: Diagnosis not present

## 2017-01-02 DIAGNOSIS — R7303 Prediabetes: Secondary | ICD-10-CM | POA: Diagnosis not present

## 2017-01-02 DIAGNOSIS — Z1159 Encounter for screening for other viral diseases: Secondary | ICD-10-CM

## 2017-01-02 DIAGNOSIS — Z6837 Body mass index (BMI) 37.0-37.9, adult: Secondary | ICD-10-CM

## 2017-01-02 DIAGNOSIS — Z6841 Body Mass Index (BMI) 40.0 and over, adult: Secondary | ICD-10-CM | POA: Insufficient documentation

## 2017-01-02 DIAGNOSIS — G4700 Insomnia, unspecified: Secondary | ICD-10-CM | POA: Diagnosis not present

## 2017-01-02 MED ORDER — ARMOUR THYROID 30 MG PO TABS: ORAL_TABLET | ORAL | 5 refills | Status: DC

## 2017-01-02 MED ORDER — ZOLPIDEM TARTRATE 10 MG PO TABS: 10.0000 mg | ORAL_TABLET | Freq: Every evening | ORAL | 1 refills | Status: DC | PRN

## 2017-01-02 MED ORDER — AMLODIPINE BESYLATE 2.5 MG PO TABS: 2.5000 mg | ORAL_TABLET | Freq: Every day | ORAL | 5 refills | Status: DC

## 2017-01-02 MED ORDER — ARMOUR THYROID 15 MG PO TABS: ORAL_TABLET | ORAL | 5 refills | Status: DC

## 2017-01-02 MED ORDER — OXYBUTYNIN CHLORIDE ER 5 MG PO TB24: 5.0000 mg | ORAL_TABLET | Freq: Every day | ORAL | 5 refills | Status: DC | PRN

## 2017-01-02 MED ORDER — ROPINIROLE HCL 4 MG PO TABS: 4.0000 mg | ORAL_TABLET | Freq: Every evening | ORAL | 5 refills | Status: DC

## 2017-01-02 NOTE — Progress Notes (Signed)
   Subjective:    Patient ID: Karen Vazquez, female    DOB: July 16, 1955, 61 y.o.   MRN: [id number]  Hypertension  This is a chronic problem. The current episode started more than 1 year ago. The problem has been gradually worsening since onset. Associated symptoms include headaches. Pertinent negatives include no chest pain or shortness of breath.   She takes Amlodipine 2.5 mg daily.She says she does not eat healthy nor exercise.Patient states her weight is gone up. She is not eating properly not exercising. She does take her medicines on a faithful basis. She relates she thinks her thyroid overall is doing well she states her blood pressure is mildly elevated She does not know how her sugars at been doing or her cholesterol. She does know that she does take her medicine for insomnia and it does help and she also takes her medicine for restless legs on as-needed basis in the bladder medicine on a when necessary basis  Review of Systems  Constitutional: Negative for activity change, fatigue and fever.  HENT: Negative for congestion.   Respiratory: Negative for cough, chest tightness and shortness of breath.   Cardiovascular: Negative for chest pain and leg swelling.  Gastrointestinal: Negative for abdominal pain.  Skin: Negative for color change.  Neurological: Positive for headaches.  Psychiatric/Behavioral: Negative for behavioral problems.       Objective:   Physical Exam  Constitutional: She appears well-developed and well-nourished. No distress.  HENT:  Head: Normocephalic and atraumatic.  Eyes: Right eye exhibits no discharge. Left eye exhibits no discharge.  Neck: No tracheal deviation present.  Cardiovascular: Normal rate, regular rhythm and normal heart sounds.   No murmur heard. Pulmonary/Chest: Effort normal and breath sounds normal. No respiratory distress. She has no wheezes. She has no rales.  Musculoskeletal: She exhibits no edema.  Lymphadenopathy:    She has no  cervical adenopathy.  Neurological: She is alert. She exhibits normal muscle tone.  Skin: Skin is warm and dry. No erythema.  Psychiatric: Her behavior is normal.  Vitals reviewed.    25 minutes was spent with the patient. Greater than half the time was spent in discussion and answering questions and counseling regarding the issues that the patient came in for today.      Assessment & Plan:  1. Essential hypertension Blood pressure subpar control-I would like to see patient's blood pressure more in the range of 130/70. I believe weight loss would help minimizing salt in the diet and being more healthy with eating and physical activity we will also go ahead and increase amlodipine new dose 5 mg daily  2. Other specified hypothyroidism So far thyroids been under good control but we will recheck TSH to make sure that this is not impacting her weight gain - TSH  3. Prediabetes Patient has been advised in the past of prediabetes importance of minimizing sugar starches in processed foods was discussed with the patient  4. Other hyperlipidemia Patient's cholesterol the passes been borderline repeat lipid profile fasting watch diet, lose weight - Lipid panel  5. Insomnia, unspecified type Ambien for rest  6. Need for influenza vaccination Flu shot today - Flu Vaccine QUAD 36+ mos IM  7. Screening for HIV (human immunodeficiency virus) Screening - HIV antibody  8. Need for hepatitis C screening test Screening - Hepatitis C antibody Morbid obesity-counseled regarding diet exercise and losing weight preferably tempers at weight loss over the next 6 months

## 2017-01-02 NOTE — Patient Instructions (Signed)
DASH Eating Plan DASH stands for "Dietary Approaches to Stop Hypertension." The DASH eating plan is a healthy eating plan that has been shown to reduce high blood pressure (hypertension). It may also reduce your risk for type 2 diabetes, heart disease, and stroke. The DASH eating plan may also help with weight loss. What are tips for following this plan? General guidelines  Avoid eating more than 2,300 mg (milligrams) of salt (sodium) a day. If you have hypertension, you may need to reduce your sodium intake to 1,500 mg a day.  Limit alcohol intake to no more than 1 drink a day for nonpregnant women and 2 drinks a day for men. One drink equals 12 oz of beer, 5 oz of wine, or 1 oz of hard liquor.  Work with your health care provider to maintain a healthy body weight or to lose weight. Ask what an ideal weight is for you.  Get at least 30 minutes of exercise that causes your heart to beat faster (aerobic exercise) most days of the week. Activities may include walking, swimming, or biking.  Work with your health care provider or diet and nutrition specialist (dietitian) to adjust your eating plan to your individual calorie needs. Reading food labels  Check food labels for the amount of sodium per serving. Choose foods with less than 5 percent of the Daily Value of sodium. Generally, foods with less than 300 mg of sodium per serving fit into this eating plan.  To find whole grains, look for the word "whole" as the first word in the ingredient list. Shopping  Buy products labeled as "low-sodium" or "no salt added."  Buy fresh foods. Avoid canned foods and premade or frozen meals. Cooking  Avoid adding salt when cooking. Use salt-free seasonings or herbs instead of table salt or sea salt. Check with your health care provider or pharmacist before using salt substitutes.  Do not fry foods. Cook foods using healthy methods such as baking, boiling, grilling, and broiling instead.  Cook with  heart-healthy oils, such as olive, canola, soybean, or sunflower oil. Meal planning   Eat a balanced diet that includes: ? 5 or more servings of fruits and vegetables each day. At each meal, try to fill half of your plate with fruits and vegetables. ? Up to 6-8 servings of whole grains each day. ? Less than 6 oz of lean meat, poultry, or fish each day. A 3-oz serving of meat is about the same size as a deck of cards. One egg equals 1 oz. ? 2 servings of low-fat dairy each day. ? A serving of nuts, seeds, or beans 5 times each week. ? Heart-healthy fats. Healthy fats called Omega-3 fatty acids are found in foods such as flaxseeds and coldwater fish, like sardines, salmon, and mackerel.  Limit how much you eat of the following: ? Canned or prepackaged foods. ? Food that is high in trans fat, such as fried foods. ? Food that is high in saturated fat, such as fatty meat. ? Sweets, desserts, sugary drinks, and other foods with added sugar. ? Full-fat dairy products.  Do not salt foods before eating.  Try to eat at least 2 vegetarian meals each week.  Eat more home-cooked food and less restaurant, buffet, and fast food.  When eating at a restaurant, ask that your food be prepared with less salt or no salt, if possible. What foods are recommended? The items listed may not be a complete list. Talk with your dietitian about what   dietary choices are best for you. Grains Whole-grain or whole-wheat bread. Whole-grain or whole-wheat pasta. Brown rice. Oatmeal. Quinoa. Bulgur. Whole-grain and low-sodium cereals. Pita bread. Low-fat, low-sodium crackers. Whole-wheat flour tortillas. Vegetables Fresh or frozen vegetables (raw, steamed, roasted, or grilled). Low-sodium or reduced-sodium tomato and vegetable juice. Low-sodium or reduced-sodium tomato sauce and tomato paste. Low-sodium or reduced-sodium canned vegetables. Fruits All fresh, dried, or frozen fruit. Canned fruit in natural juice (without  added sugar). Meat and other protein foods Skinless chicken or turkey. Ground chicken or turkey. Pork with fat trimmed off. Fish and seafood. Egg whites. Dried beans, peas, or lentils. Unsalted nuts, nut butters, and seeds. Unsalted canned beans. Lean cuts of beef with fat trimmed off. Low-sodium, lean deli meat. Dairy Low-fat (1%) or fat-free (skim) milk. Fat-free, low-fat, or reduced-fat cheeses. Nonfat, low-sodium ricotta or cottage cheese. Low-fat or nonfat yogurt. Low-fat, low-sodium cheese. Fats and oils Soft margarine without trans fats. Vegetable oil. Low-fat, reduced-fat, or light mayonnaise and salad dressings (reduced-sodium). Canola, safflower, olive, soybean, and sunflower oils. Avocado. Seasoning and other foods Herbs. Spices. Seasoning mixes without salt. Unsalted popcorn and pretzels. Fat-free sweets. What foods are not recommended? The items listed may not be a complete list. Talk with your dietitian about what dietary choices are best for you. Grains Baked goods made with fat, such as croissants, muffins, or some breads. Dry pasta or rice meal packs. Vegetables Creamed or fried vegetables. Vegetables in a cheese sauce. Regular canned vegetables (not low-sodium or reduced-sodium). Regular canned tomato sauce and paste (not low-sodium or reduced-sodium). Regular tomato and vegetable juice (not low-sodium or reduced-sodium). Pickles. Olives. Fruits Canned fruit in a light or heavy syrup. Fried fruit. Fruit in cream or butter sauce. Meat and other protein foods Fatty cuts of meat. Ribs. Fried meat. Bacon. Sausage. Bologna and other processed lunch meats. Salami. Fatback. Hotdogs. Bratwurst. Salted nuts and seeds. Canned beans with added salt. Canned or smoked fish. Whole eggs or egg yolks. Chicken or turkey with skin. Dairy Whole or 2% milk, cream, and half-and-half. Whole or full-fat cream cheese. Whole-fat or sweetened yogurt. Full-fat cheese. Nondairy creamers. Whipped toppings.  Processed cheese and cheese spreads. Fats and oils Butter. Stick margarine. Lard. Shortening. Ghee. Bacon fat. Tropical oils, such as coconut, palm kernel, or palm oil. Seasoning and other foods Salted popcorn and pretzels. Onion salt, garlic salt, seasoned salt, table salt, and sea salt. Worcestershire sauce. Tartar sauce. Barbecue sauce. Teriyaki sauce. Soy sauce, including reduced-sodium. Steak sauce. Canned and packaged gravies. Fish sauce. Oyster sauce. Cocktail sauce. Horseradish that you find on the shelf. Ketchup. Mustard. Meat flavorings and tenderizers. Bouillon cubes. Hot sauce and Tabasco sauce. Premade or packaged marinades. Premade or packaged taco seasonings. Relishes. Regular salad dressings. Where to find more information:  National Heart, Lung, and Blood Institute: www.nhlbi.nih.gov  American Heart Association: www.heart.org Summary  The DASH eating plan is a healthy eating plan that has been shown to reduce high blood pressure (hypertension). It may also reduce your risk for type 2 diabetes, heart disease, and stroke.  With the DASH eating plan, you should limit salt (sodium) intake to 2,300 mg a day. If you have hypertension, you may need to reduce your sodium intake to 1,500 mg a day.  When on the DASH eating plan, aim to eat more fresh fruits and vegetables, whole grains, lean proteins, low-fat dairy, and heart-healthy fats.  Work with your health care provider or diet and nutrition specialist (dietitian) to adjust your eating plan to your individual   calorie needs. This information is not intended to replace advice given to you by your health care provider. Make sure you discuss any questions you have with your health care provider. Document Released: 02/24/2011 Document Revised: 02/29/2016 Document Reviewed: 02/29/2016 Elsevier Interactive Patient Education  2017 Elsevier Inc.  

## 2017-01-10 DIAGNOSIS — E038 Other specified hypothyroidism: Secondary | ICD-10-CM | POA: Diagnosis not present

## 2017-01-10 DIAGNOSIS — Z114 Encounter for screening for human immunodeficiency virus [HIV]: Secondary | ICD-10-CM | POA: Diagnosis not present

## 2017-01-10 DIAGNOSIS — Z1159 Encounter for screening for other viral diseases: Secondary | ICD-10-CM | POA: Diagnosis not present

## 2017-01-10 DIAGNOSIS — E7849 Other hyperlipidemia: Secondary | ICD-10-CM | POA: Diagnosis not present

## 2017-01-11 ENCOUNTER — Encounter: Payer: Self-pay | Admitting: Family Medicine

## 2017-01-11 LAB — TSH: TSH: 2.3 u[IU]/mL (ref 0.450–4.500)

## 2017-01-11 LAB — LIPID PANEL
Chol/HDL Ratio: 4.4 ratio (ref 0.0–4.4)
Cholesterol, Total: 185 mg/dL (ref 100–199)
HDL: 42 mg/dL (ref >39)
LDL Calculated: 121 mg/dL — ABNORMAL HIGH (ref 0–99)
Triglycerides: 111 mg/dL (ref 0–149)
VLDL Cholesterol Cal: 22 mg/dL (ref 5–40)

## 2017-01-11 LAB — HEPATITIS C ANTIBODY: Hep C Virus Ab: 0.4 s/co ratio (ref 0.0–0.9)

## 2017-01-11 LAB — HIV ANTIBODY (ROUTINE TESTING W REFLEX): HIV SCREEN 4TH GENERATION: NONREACTIVE

## 2017-01-19 DIAGNOSIS — G4733 Obstructive sleep apnea (adult) (pediatric): Secondary | ICD-10-CM | POA: Diagnosis not present

## 2017-02-15 DIAGNOSIS — L821 Other seborrheic keratosis: Secondary | ICD-10-CM | POA: Diagnosis not present

## 2017-02-23 ENCOUNTER — Telehealth: Payer: Self-pay | Admitting: Family Medicine

## 2017-02-23 DIAGNOSIS — Z01419 Encounter for gynecological examination (general) (routine) without abnormal findings: Secondary | ICD-10-CM | POA: Diagnosis not present

## 2017-02-23 DIAGNOSIS — Z1231 Encounter for screening mammogram for malignant neoplasm of breast: Secondary | ICD-10-CM | POA: Diagnosis not present

## 2017-02-23 DIAGNOSIS — Z6841 Body Mass Index (BMI) 40.0 and over, adult: Secondary | ICD-10-CM | POA: Diagnosis not present

## 2017-02-23 NOTE — Telephone Encounter (Signed)
Patient advised per Dr Nicki Reaper : Unfortunately there is really nothing over-the-counter or prescription that would help with the cold typically it can take 3-7 days for it to run its course if the patient is having significant sinus pressure or pain or discomfort or having significant chest congestion wheezing or feels like it's gotten to the point that it's a bacterial problem Dr Nicki Reaper would recommend the patient do a office visit. Patient verbalized understanding and stated she would call back in the morning for an appointment if she not feeling better in the am.

## 2017-02-23 NOTE — Telephone Encounter (Signed)
Unfortunately there is really nothing over-the-counter or prescription that would help with the cold typically it can take 3-7 days for it to run its course if the patient is having significant sinus pressure or pain or discomfort or having significant chest congestion wheezing or feels like it's gotten to the point that it's a bacterial problem I would recommend the patient do a office visit

## 2017-02-23 NOTE — Telephone Encounter (Signed)
Per pt her granddaughter Nila Nephew was seen by Dr.Steve last week,now she is sick with a head cold and productive cough. She would lkie to know what you recommend. She has been using Day quil and Nitequil

## 2017-02-23 NOTE — Telephone Encounter (Signed)
Pt's granddaughter was seen this past Thursday and now the pt has a cough and is loosing her voice. Pt is wanting to know if something can be called in. Please advise.

## 2017-03-02 ENCOUNTER — Ambulatory Visit: Payer: Federal, State, Local not specified - PPO | Admitting: Family Medicine

## 2017-03-02 ENCOUNTER — Encounter: Payer: Self-pay | Admitting: Family Medicine

## 2017-03-02 VITALS — BP 150/80 | Temp 98.3°F | Ht 64.0 in | Wt 242.0 lb

## 2017-03-02 DIAGNOSIS — J329 Chronic sinusitis, unspecified: Secondary | ICD-10-CM | POA: Diagnosis not present

## 2017-03-02 MED ORDER — BENZONATATE 100 MG PO CAPS: 100.0000 mg | ORAL_CAPSULE | Freq: Two times a day (BID) | ORAL | 0 refills | Status: DC | PRN

## 2017-03-02 MED ORDER — CEFDINIR 300 MG PO CAPS: 300.0000 mg | ORAL_CAPSULE | Freq: Two times a day (BID) | ORAL | 0 refills | Status: DC

## 2017-03-02 MED ORDER — ALBUTEROL SULFATE HFA 108 (90 BASE) MCG/ACT IN AERS: 2.0000 | INHALATION_SPRAY | Freq: Four times a day (QID) | RESPIRATORY_TRACT | 2 refills | Status: DC | PRN

## 2017-03-02 NOTE — Progress Notes (Signed)
   Subjective:    Patient ID: [name], female    DOB: 1956-03-03, 61 y.o.   MRN: [id number]  HPI  Patient is here today for productive cough and congestion. This started a week ago. She is taking Dayquil and Nitequil and otc cough medication.  Pos cough and cong and potential wheezing  Pos cong in the chest low gr fever   Patient's daughter rather granddaughter had similar symptomatology.  Now  positive nasal discharge  Review of Systems No headache, no major weight loss or weight gain, no chest pain no back pain abdominal pain no change in bowel habits complete ROS otherwise negative     Objective:   Physical Exam  Alert, mild malaise. Hydration good Vitals stable. frontal/ maxillary tenderness evident positive nasal congestion. pharynx normal neck supple  lungs clear/no crackles or wheezes. heart regular in rhythm       Assessment & Plan:  Impression rhinosinusitis likely post viral, discussed with patient. plan antibiotics prescribed. Questions answered. Symptomatic care discussed. warning signs discussed. WSL

## 2017-04-21 DIAGNOSIS — M25562 Pain in left knee: Secondary | ICD-10-CM | POA: Diagnosis not present

## 2017-04-21 DIAGNOSIS — Z96652 Presence of left artificial knee joint: Secondary | ICD-10-CM | POA: Diagnosis not present

## 2017-05-05 ENCOUNTER — Telehealth: Payer: Self-pay | Admitting: *Deleted

## 2017-05-05 NOTE — Telephone Encounter (Signed)
PA for zolpidem 10mg  #90 approved. Case ID [id number]. [facility] notified.

## 2017-05-18 ENCOUNTER — Encounter (INDEPENDENT_AMBULATORY_CARE_PROVIDER_SITE_OTHER): Payer: Self-pay | Admitting: *Deleted

## 2017-05-18 ENCOUNTER — Other Ambulatory Visit (INDEPENDENT_AMBULATORY_CARE_PROVIDER_SITE_OTHER): Payer: Self-pay | Admitting: *Deleted

## 2017-05-18 DIAGNOSIS — D508 Other iron deficiency anemias: Secondary | ICD-10-CM

## 2017-06-08 DIAGNOSIS — Z96651 Presence of right artificial knee joint: Secondary | ICD-10-CM | POA: Insufficient documentation

## 2017-06-08 DIAGNOSIS — M1711 Unilateral primary osteoarthritis, right knee: Secondary | ICD-10-CM | POA: Diagnosis not present

## 2017-06-19 ENCOUNTER — Ambulatory Visit: Payer: Federal, State, Local not specified - PPO | Admitting: Nurse Practitioner

## 2017-06-19 ENCOUNTER — Encounter: Payer: Self-pay | Admitting: Nurse Practitioner

## 2017-06-19 VITALS — BP 122/80 | Ht 64.0 in | Wt 244.0 lb

## 2017-06-19 DIAGNOSIS — D500 Iron deficiency anemia secondary to blood loss (chronic): Secondary | ICD-10-CM

## 2017-06-19 DIAGNOSIS — R5383 Other fatigue: Secondary | ICD-10-CM | POA: Diagnosis not present

## 2017-06-19 DIAGNOSIS — M778 Other enthesopathies, not elsewhere classified: Secondary | ICD-10-CM | POA: Diagnosis not present

## 2017-06-19 DIAGNOSIS — I10 Essential (primary) hypertension: Secondary | ICD-10-CM

## 2017-06-19 DIAGNOSIS — Z79899 Other long term (current) drug therapy: Secondary | ICD-10-CM

## 2017-06-19 DIAGNOSIS — E785 Hyperlipidemia, unspecified: Secondary | ICD-10-CM | POA: Diagnosis not present

## 2017-06-19 MED ORDER — MELOXICAM 15 MG PO TABS: 15.0000 mg | ORAL_TABLET | Freq: Every day | ORAL | 0 refills | Status: DC

## 2017-06-20 ENCOUNTER — Encounter: Payer: Self-pay | Admitting: Nurse Practitioner

## 2017-06-20 DIAGNOSIS — E785 Hyperlipidemia, unspecified: Secondary | ICD-10-CM | POA: Diagnosis not present

## 2017-06-20 DIAGNOSIS — D500 Iron deficiency anemia secondary to blood loss (chronic): Secondary | ICD-10-CM | POA: Diagnosis not present

## 2017-06-20 DIAGNOSIS — I10 Essential (primary) hypertension: Secondary | ICD-10-CM | POA: Diagnosis not present

## 2017-06-20 DIAGNOSIS — Z79899 Other long term (current) drug therapy: Secondary | ICD-10-CM | POA: Diagnosis not present

## 2017-06-20 NOTE — Progress Notes (Signed)
Subjective:  Presents for c/o left wrist area pain that began yesterday morning, first noticed when she woke up. No history of overuse or injury. Has tried ice, heat and lidocaine. Has been wearing a brace which helps. Also requesting lab work for visit with Nicki Reaper on 4/15.  Objective:   BP 122/80   Ht 5\' 4"  (1.626 m)   Wt 244 lb (110.7 kg)   LMP 05/24/2010   BMI 41.88 kg/m  NAD. Alert, oriented. A small soft nodule noted at the base of the left thumb ventral aspect right wrist. Mildly tender to palpation. No erythema or warmth. Full passive ROM of the wrist with mild tenderness.   Assessment:   Problem List Items Addressed This Visit      Cardiovascular and Mediastinum   HTN (hypertension)   Relevant Orders   Basic metabolic panel     Other   Anemia, iron deficiency   Relevant Orders   CBC with Differential/Platelet   Hyperlipidemia   Relevant Orders   Lipid panel    Other Visit Diagnoses    Left wrist tendonitis    -  Primary   Other fatigue       Relevant Orders   VITAMIN D 25 Hydroxy (Vit-D Deficiency, Fractures)   High risk medication use       Relevant Orders   Hepatic function panel       Plan:   Meds ordered this encounter  Medications  . meloxicam (MOBIC) 15 MG tablet    Sig: Take 1 tablet (15 mg total) by mouth daily.    Dispense:  30 tablet    Refill:  0    Order Specific Question:   Supervising Provider    Answer:   Mikey Kirschner [[id number]]   Continue using wrist brace as much as possible. Continue ice/heat applications. Should continue to see gradual improvement. Call back if worsens or persists. Labs ordered. Recheck on 4/15 as planned.

## 2017-06-21 LAB — LIPID PANEL
Chol/HDL Ratio: 4.8 ratio — ABNORMAL HIGH (ref 0.0–4.4)
Cholesterol, Total: 191 mg/dL (ref 100–199)
HDL: 40 mg/dL (ref >39)
LDL CALC: 122 mg/dL — AB (ref 0–99)
TRIGLYCERIDES: 143 mg/dL (ref 0–149)
VLDL CHOLESTEROL CAL: 29 mg/dL (ref 5–40)

## 2017-06-21 LAB — CBC WITH DIFFERENTIAL/PLATELET
BASOS: 0 %
Basophils Absolute: 0 10*3/uL (ref 0.0–0.2)
EOS (ABSOLUTE): 0.1 10*3/uL (ref 0.0–0.4)
Eos: 2 %
HEMATOCRIT: 41.5 % (ref 34.0–46.6)
Hemoglobin: 12.9 g/dL (ref 11.1–15.9)
Immature Grans (Abs): 0 10*3/uL (ref 0.0–0.1)
Immature Granulocytes: 0 %
LYMPHS ABS: 1.8 10*3/uL (ref 0.7–3.1)
Lymphs: 34 %
MCH: 26.8 pg (ref 26.6–33.0)
MCHC: 31.1 g/dL — AB (ref 31.5–35.7)
MCV: 86 fL (ref 79–97)
MONOS ABS: 0.5 10*3/uL (ref 0.1–0.9)
Monocytes: 9 %
Neutrophils Absolute: 3.1 10*3/uL (ref 1.4–7.0)
Neutrophils: 55 %
Platelets: 201 10*3/uL (ref 150–379)
RBC: 4.81 x10E6/uL (ref 3.77–5.28)
RDW: 14 % (ref 12.3–15.4)
WBC: 5.5 10*3/uL (ref 3.4–10.8)

## 2017-06-21 LAB — BASIC METABOLIC PANEL
BUN / CREAT RATIO: 13 (ref 12–28)
BUN: 11 mg/dL (ref 8–27)
CALCIUM: 9.8 mg/dL (ref 8.7–10.3)
CO2: 25 mmol/L (ref 20–29)
Chloride: 104 mmol/L (ref 96–106)
Creatinine, Ser: 0.85 mg/dL (ref 0.57–1.00)
GFR, EST AFRICAN AMERICAN: 85 mL/min/{1.73_m2} (ref >59)
GFR, EST NON AFRICAN AMERICAN: 74 mL/min/{1.73_m2} (ref >59)
Glucose: 83 mg/dL (ref 65–99)
Potassium: 5.6 mmol/L — ABNORMAL HIGH (ref 3.5–5.2)
SODIUM: 143 mmol/L (ref 134–144)

## 2017-06-21 LAB — HEPATIC FUNCTION PANEL
ALT: 12 IU/L (ref 0–32)
AST: 16 IU/L (ref 0–40)
Albumin: 4.2 g/dL (ref 3.6–4.8)
Alkaline Phosphatase: 125 IU/L — ABNORMAL HIGH (ref 39–117)
Bilirubin Total: 0.3 mg/dL (ref 0.0–1.2)
Bilirubin, Direct: 0.09 mg/dL (ref 0.00–0.40)
Total Protein: 6.6 g/dL (ref 6.0–8.5)

## 2017-06-21 LAB — VITAMIN D 25 HYDROXY (VIT D DEFICIENCY, FRACTURES): VIT D 25 HYDROXY: 27.7 ng/mL — AB (ref 30.0–100.0)

## 2017-07-03 ENCOUNTER — Ambulatory Visit: Payer: Federal, State, Local not specified - PPO | Admitting: Family Medicine

## 2017-07-03 ENCOUNTER — Encounter: Payer: Self-pay | Admitting: Family Medicine

## 2017-07-03 VITALS — BP 122/86 | Ht 64.0 in | Wt 244.4 lb

## 2017-07-03 DIAGNOSIS — E7849 Other hyperlipidemia: Secondary | ICD-10-CM | POA: Diagnosis not present

## 2017-07-03 DIAGNOSIS — E038 Other specified hypothyroidism: Secondary | ICD-10-CM | POA: Diagnosis not present

## 2017-07-03 DIAGNOSIS — E875 Hyperkalemia: Secondary | ICD-10-CM | POA: Diagnosis not present

## 2017-07-03 DIAGNOSIS — D509 Iron deficiency anemia, unspecified: Secondary | ICD-10-CM | POA: Diagnosis not present

## 2017-07-03 DIAGNOSIS — G4700 Insomnia, unspecified: Secondary | ICD-10-CM

## 2017-07-03 DIAGNOSIS — I10 Essential (primary) hypertension: Secondary | ICD-10-CM

## 2017-07-03 DIAGNOSIS — G2581 Restless legs syndrome: Secondary | ICD-10-CM

## 2017-07-03 NOTE — Patient Instructions (Signed)
Results for orders placed or performed in visit on 06/19/17  CBC with Differential/Platelet  Result Value Ref Range   WBC 5.5 3.4 - 10.8 x10E3/uL   RBC 4.81 3.77 - 5.28 x10E6/uL   Hemoglobin 12.9 11.1 - 15.9 g/dL   Hematocrit 41.5 34.0 - 46.6 %   MCV 86 79 - 97 fL   MCH 26.8 26.6 - 33.0 pg   MCHC 31.1 (L) 31.5 - 35.7 g/dL   RDW 14.0 12.3 - 15.4 %   Platelets 201 150 - 379 x10E3/uL   Neutrophils 55 Not Estab. %   Lymphs 34 Not Estab. %   Monocytes 9 Not Estab. %   Eos 2 Not Estab. %   Basos 0 Not Estab. %   Neutrophils Absolute 3.1 1.4 - 7.0 x10E3/uL   Lymphocytes Absolute 1.8 0.7 - 3.1 x10E3/uL   Monocytes Absolute 0.5 0.1 - 0.9 x10E3/uL   EOS (ABSOLUTE) 0.1 0.0 - 0.4 x10E3/uL   Basophils Absolute 0.0 0.0 - 0.2 x10E3/uL   Immature Granulocytes 0 Not Estab. %   Immature Grans (Abs) 0.0 0.0 - 0.1 [id number]  Basic metabolic panel  Result Value Ref Range   Glucose 83 65 - 99 mg/dL   BUN 11 8 - 27 mg/dL   Creatinine, Ser 0.85 0.57 - 1.00 mg/dL   GFR calc non Af Amer 74 >59 mL/min/1.73   GFR calc Af Amer 85 >59 mL/min/1.73   BUN/Creatinine Ratio 13 12 - 28   Sodium 143 134 - 144 mmol/L   Potassium 5.6 (H) 3.5 - 5.2 mmol/L   Chloride 104 96 - 106 mmol/L   CO2 25 20 - 29 mmol/L   Calcium 9.8 8.7 - 10.3 mg/dL  Lipid panel  Result Value Ref Range   Cholesterol, Total 191 100 - 199 mg/dL   Triglycerides 143 0 - 149 mg/dL   HDL 40 >39 mg/dL   VLDL Cholesterol Cal 29 5 - 40 mg/dL   LDL Calculated 122 (H) 0 - 99 mg/dL   Chol/HDL Ratio 4.8 (H) 0.0 - 4.4 ratio  Hepatic function panel  Result Value Ref Range   Total Protein 6.6 6.0 - 8.5 g/dL   Albumin 4.2 3.6 - 4.8 g/dL   Bilirubin Total 0.3 0.0 - 1.2 mg/dL   Bilirubin, Direct 0.09 0.00 - 0.40 mg/dL   Alkaline Phosphatase 125 (H) 39 - 117 IU/L   AST 16 0 - 40 IU/L   ALT 12 0 - 32 IU/L  VITAMIN D 25 Hydroxy (Vit-D Deficiency, Fractures)  Result Value Ref Range   Vit D, 25-Hydroxy 27.7 (L) 30.0 - 100.0 ng/mL

## 2017-07-03 NOTE — Progress Notes (Signed)
Subjective:    Patient ID: Karen Vazquez, female    DOB: January 27, 1956, 62 y.o.   MRN: [id number]  HPI Pt here for follow up on HTN and thyroid.    Patient for blood pressure check up. Patient relates compliance with meds. Todays BP reviewed with the patient. Patient denies issues with medication. Patient relates reasonable diet. Patient tries to minimize salt. Patient aware of BP goals.  Patient here for follow-up regarding cholesterol.  Patient does try to maintain a reasonable diet.  Patient does take the medication on a regular basis.  Denies missing a dose.  The patient denies any obvious side effects.  Prior blood work results reviewed with the patient.  The patient is aware of his cholesterol goals and the need to keep it under good control to lessen the risk of disease.  The patient's BMI is calculated.  It is in the vital signs and acknowledged.  It is above the recommended BMI for the patient's height and weight.  The patient has been counseled regarding healthy diet, restricted portions, avoiding excessive carbohydrates/sugary foods, and increase physical activity as health permits.  It is in the patient's best interest to lower the risk of secondary illness including heart disease strokes and cancer by losing weight.  The patient acknowledges this information.  Patient has thyroid condition.  Takes thyroid medication on a regular basis.  States that the proper way.  Relates compliance.  States no negative side effects.  States condition seems to be under good control.  Patient for blood pressure check up. Patient relates compliance with meds. Todays BP reviewed with the patient. Patient denies issues with medication. Patient relates reasonable diet. Patient tries to minimize salt. Patient aware of BP goals.    Left knee Dr Maureen Ralphs- tendonitis-inserts to try  Review of Systems  Constitutional: Negative for activity change, fatigue and fever.  HENT: Negative for congestion.     Respiratory: Negative for cough, chest tightness and shortness of breath.   Cardiovascular: Negative for chest pain and leg swelling.  Gastrointestinal: Negative for abdominal pain.  Skin: Negative for color change.  Neurological: Negative for headaches.  Psychiatric/Behavioral: Negative for behavioral problems.       Objective:   Physical Exam  Constitutional: She appears well-developed and well-nourished. No distress.  HENT:  Head: Normocephalic and atraumatic.  Eyes: Right eye exhibits no discharge. Left eye exhibits no discharge.  Neck: No tracheal deviation present.  Cardiovascular: Normal rate, regular rhythm and normal heart sounds.  No murmur heard. Pulmonary/Chest: Effort normal and breath sounds normal. No respiratory distress. She has no wheezes. She has no rales.  Musculoskeletal: She exhibits no edema.  Lymphadenopathy:    She has no cervical adenopathy.  Neurological: She is alert. She exhibits normal muscle tone.  Skin: Skin is warm and dry. No erythema.  Psychiatric: Her behavior is normal.  Vitals reviewed.  Results for orders placed or performed in visit on 06/19/17  CBC with Differential/Platelet  Result Value Ref Range   WBC 5.5 3.4 - 10.8 x10E3/uL   RBC 4.81 3.77 - 5.28 x10E6/uL   Hemoglobin 12.9 11.1 - 15.9 g/dL   Hematocrit 41.5 34.0 - 46.6 %   MCV 86 79 - 97 fL   MCH 26.8 26.6 - 33.0 pg   MCHC 31.1 (L) 31.5 - 35.7 g/dL   RDW 14.0 12.3 - 15.4 %   Platelets 201 150 - 379 x10E3/uL   Neutrophils 55 Not Estab. %   Lymphs 34 Not Estab. %  Monocytes 9 Not Estab. %   Eos 2 Not Estab. %   Basos 0 Not Estab. %   Neutrophils Absolute 3.1 1.4 - 7.0 x10E3/uL   Lymphocytes Absolute 1.8 0.7 - 3.1 x10E3/uL   Monocytes Absolute 0.5 0.1 - 0.9 x10E3/uL   EOS (ABSOLUTE) 0.1 0.0 - 0.4 x10E3/uL   Basophils Absolute 0.0 0.0 - 0.2 x10E3/uL   Immature Granulocytes 0 Not Estab. %   Immature Grans (Abs) 0.0 0.0 - 0.1 [id number]  Basic metabolic panel  Result Value  Ref Range   Glucose 83 65 - 99 mg/dL   BUN 11 8 - 27 mg/dL   Creatinine, Ser 0.85 0.57 - 1.00 mg/dL   GFR calc non Af Amer 74 >59 mL/min/1.73   GFR calc Af Amer 85 >59 mL/min/1.73   BUN/Creatinine Ratio 13 12 - 28   Sodium 143 134 - 144 mmol/L   Potassium 5.6 (H) 3.5 - 5.2 mmol/L   Chloride 104 96 - 106 mmol/L   CO2 25 20 - 29 mmol/L   Calcium 9.8 8.7 - 10.3 mg/dL  Lipid panel  Result Value Ref Range   Cholesterol, Total 191 100 - 199 mg/dL   Triglycerides 143 0 - 149 mg/dL   HDL 40 >39 mg/dL   VLDL Cholesterol Cal 29 5 - 40 mg/dL   LDL Calculated 122 (H) 0 - 99 mg/dL   Chol/HDL Ratio 4.8 (H) 0.0 - 4.4 ratio  Hepatic function panel  Result Value Ref Range   Total Protein 6.6 6.0 - 8.5 g/dL   Albumin 4.2 3.6 - 4.8 g/dL   Bilirubin Total 0.3 0.0 - 1.2 mg/dL   Bilirubin, Direct 0.09 0.00 - 0.40 mg/dL   Alkaline Phosphatase 125 (H) 39 - 117 IU/L   AST 16 0 - 40 IU/L   ALT 12 0 - 32 IU/L  VITAMIN D 25 Hydroxy (Vit-D Deficiency, Fractures)  Result Value Ref Range   Vit D, 25-Hydroxy 27.7 (L) 30.0 - 100.0 ng/mL         Assessment & Plan:  HTN- Patient was seen today as part of a visit regarding hypertension. The importance of healthy diet and regular physical activity was discussed. The importance of compliance with medications discussed.  Ideal goal is to keep blood pressure low elevated levels certainly below 675/91 when possible.  The patient was counseled that keeping blood pressure under control lessen his risk of heart attack, stroke, kidney failure, and early death.  The importance of regular follow-ups was discussed with the patient.  Low-salt diet such as DASH recommended. Regular physical activity was recommended as well.  Patient was advised to keep regular follow-ups.  Patient was seen today regarding hypothyroidism.  Importance of healthy diet, regular physical activity was discussed.  Importance of compliance with medication and regular checks regarding this was  discussed.   Insomnia does well with medication continue medication.  Restless legs does well with medication continue medication  Morbid obesity patient understands the importance of watching diet minimizing portions increasing physical activity she would like to see consultation with wellness center we will make that referral  Recent hyperkalemia recheck metabolic 7 more than likely hemolysis patient not consuming any high potassium foods  25 minutes was spent with the patient.  This statement verifies that 25 minutes was indeed spent with the patient. Greater than half the time was spent in discussion, counseling and answering questions  regarding the issues that the patient came in for today as reflected in the diagnosis (s)  please refer to documentation for further details.  Mild vitamin D deficiency 400 unit I used  Iron deficient anemia check ferritin

## 2017-07-04 ENCOUNTER — Other Ambulatory Visit: Payer: Self-pay | Admitting: Family Medicine

## 2017-07-05 ENCOUNTER — Other Ambulatory Visit: Payer: Self-pay | Admitting: *Deleted

## 2017-07-05 ENCOUNTER — Other Ambulatory Visit: Payer: Self-pay | Admitting: Family Medicine

## 2017-07-05 MED ORDER — ROPINIROLE HCL 4 MG PO TABS: 4.0000 mg | ORAL_TABLET | Freq: Every evening | ORAL | 1 refills | Status: DC

## 2017-07-05 NOTE — Telephone Encounter (Signed)
12 refills 

## 2017-07-05 NOTE — Telephone Encounter (Signed)
May have this +5 refills 

## 2017-07-07 ENCOUNTER — Encounter: Payer: Self-pay | Admitting: Family Medicine

## 2017-07-13 ENCOUNTER — Encounter (INDEPENDENT_AMBULATORY_CARE_PROVIDER_SITE_OTHER): Payer: Federal, State, Local not specified - PPO

## 2017-07-14 DIAGNOSIS — E875 Hyperkalemia: Secondary | ICD-10-CM | POA: Diagnosis not present

## 2017-07-14 DIAGNOSIS — E7849 Other hyperlipidemia: Secondary | ICD-10-CM | POA: Diagnosis not present

## 2017-07-14 DIAGNOSIS — E038 Other specified hypothyroidism: Secondary | ICD-10-CM | POA: Diagnosis not present

## 2017-07-15 LAB — FERRITIN: FERRITIN: 20 ng/mL (ref 15–150)

## 2017-07-15 LAB — T4, FREE: FREE T4: 1.18 ng/dL (ref 0.82–1.77)

## 2017-07-15 LAB — TSH: TSH: 2.03 u[IU]/mL (ref 0.450–4.500)

## 2017-07-15 LAB — POTASSIUM: Potassium: 4.7 mmol/L (ref 3.5–5.2)

## 2017-07-20 ENCOUNTER — Ambulatory Visit (INDEPENDENT_AMBULATORY_CARE_PROVIDER_SITE_OTHER): Payer: Federal, State, Local not specified - PPO | Admitting: Family Medicine

## 2017-07-20 ENCOUNTER — Encounter (INDEPENDENT_AMBULATORY_CARE_PROVIDER_SITE_OTHER): Payer: Self-pay | Admitting: Family Medicine

## 2017-07-20 VITALS — BP 133/75 | HR 70 | Temp 97.9°F | Ht 64.0 in | Wt 239.0 lb

## 2017-07-20 DIAGNOSIS — R0609 Other forms of dyspnea: Secondary | ICD-10-CM

## 2017-07-20 DIAGNOSIS — R5383 Other fatigue: Secondary | ICD-10-CM | POA: Diagnosis not present

## 2017-07-20 DIAGNOSIS — Z1331 Encounter for screening for depression: Secondary | ICD-10-CM

## 2017-07-20 DIAGNOSIS — Z6841 Body Mass Index (BMI) 40.0 and over, adult: Secondary | ICD-10-CM

## 2017-07-20 DIAGNOSIS — E559 Vitamin D deficiency, unspecified: Secondary | ICD-10-CM | POA: Diagnosis not present

## 2017-07-20 DIAGNOSIS — Z0289 Encounter for other administrative examinations: Secondary | ICD-10-CM

## 2017-07-20 DIAGNOSIS — R0600 Dyspnea, unspecified: Secondary | ICD-10-CM

## 2017-07-20 DIAGNOSIS — Z9189 Other specified personal risk factors, not elsewhere classified: Secondary | ICD-10-CM | POA: Diagnosis not present

## 2017-07-20 MED ORDER — VITAMIN D (ERGOCALCIFEROL) 1.25 MG (50000 UNIT) PO CAPS: 50000.0000 [IU] | ORAL_CAPSULE | ORAL | 0 refills | Status: DC

## 2017-07-21 LAB — HEMOGLOBIN A1C
ESTIMATED AVERAGE GLUCOSE: 103 mg/dL
Hgb A1c MFr Bld: 5.2 % (ref 4.8–5.6)

## 2017-07-21 LAB — CBC WITH DIFFERENTIAL
BASOS ABS: 0 10*3/uL (ref 0.0–0.2)
BASOS: 0 %
EOS (ABSOLUTE): 0.1 10*3/uL (ref 0.0–0.4)
Eos: 2 %
Hematocrit: 41.4 % (ref 34.0–46.6)
Hemoglobin: 12.8 g/dL (ref 11.1–15.9)
IMMATURE GRANS (ABS): 0 10*3/uL (ref 0.0–0.1)
IMMATURE GRANULOCYTES: 0 %
LYMPHS: 29 %
Lymphocytes Absolute: 1.5 10*3/uL (ref 0.7–3.1)
MCH: 26.4 pg — ABNORMAL LOW (ref 26.6–33.0)
MCHC: 30.9 g/dL — ABNORMAL LOW (ref 31.5–35.7)
MCV: 86 fL (ref 79–97)
Monocytes Absolute: 0.4 10*3/uL (ref 0.1–0.9)
Monocytes: 8 %
NEUTROS PCT: 61 %
Neutrophils Absolute: 3.3 10*3/uL (ref 1.4–7.0)
RBC: 4.84 x10E6/uL (ref 3.77–5.28)
RDW: 13.8 % (ref 12.3–15.4)
WBC: 5.3 10*3/uL (ref 3.4–10.8)

## 2017-07-21 LAB — COMPREHENSIVE METABOLIC PANEL
ALT: 15 IU/L (ref 0–32)
AST: 18 IU/L (ref 0–40)
Albumin/Globulin Ratio: 1.7 (ref 1.2–2.2)
Albumin: 4.2 g/dL (ref 3.6–4.8)
Alkaline Phosphatase: 128 IU/L — ABNORMAL HIGH (ref 39–117)
BUN/Creatinine Ratio: 13 (ref 12–28)
BUN: 11 mg/dL (ref 8–27)
Bilirubin Total: 0.5 mg/dL (ref 0.0–1.2)
CALCIUM: 9.5 mg/dL (ref 8.7–10.3)
CO2: 25 mmol/L (ref 20–29)
CREATININE: 0.82 mg/dL (ref 0.57–1.00)
Chloride: 103 mmol/L (ref 96–106)
GFR, EST AFRICAN AMERICAN: 89 mL/min/{1.73_m2} (ref >59)
GFR, EST NON AFRICAN AMERICAN: 77 mL/min/{1.73_m2} (ref >59)
Globulin, Total: 2.5 g/dL (ref 1.5–4.5)
Glucose: 82 mg/dL (ref 65–99)
Potassium: 4.6 mmol/L (ref 3.5–5.2)
Sodium: 141 mmol/L (ref 134–144)
TOTAL PROTEIN: 6.7 g/dL (ref 6.0–8.5)

## 2017-07-21 LAB — LIPID PANEL WITH LDL/HDL RATIO
CHOLESTEROL TOTAL: 185 mg/dL (ref 100–199)
HDL: 43 mg/dL (ref >39)
LDL Calculated: 119 mg/dL — ABNORMAL HIGH (ref 0–99)
LDl/HDL Ratio: 2.8 ratio (ref 0.0–3.2)
Triglycerides: 113 mg/dL (ref 0–149)
VLDL CHOLESTEROL CAL: 23 mg/dL (ref 5–40)

## 2017-07-21 LAB — VITAMIN B12: VITAMIN B 12: 292 pg/mL (ref 232–1245)

## 2017-07-21 LAB — FOLATE: Folate: 8.1 ng/mL (ref >3.0)

## 2017-07-21 LAB — INSULIN, RANDOM: INSULIN: 12.5 u[IU]/mL (ref 2.6–24.9)

## 2017-07-24 NOTE — Progress Notes (Signed)
.  Office: [phone]  /  Fax: [phone]   HPI:   Chief Complaint: OBESITY  Karen Vazquez (MR# [id number]) is a 62 y.o. female who presents on 07/24/2017 for obesity evaluation and treatment. Current BMI is Body mass index is 41.02 kg/m.Marland Kitchen Karen Vazquez has struggled with obesity for years and has been unsuccessful in either losing weight or maintaining long term weight loss. Karen Vazquez attended our information session and states she is currently in the action stage of change and ready to dedicate time achieving and maintaining a healthier weight.  Karen Vazquez states her family eats meals together she thinks her family will eat healthier with  her her desired weight loss is 74 lbs she has been heavy most of  her life she started gaining weight in her 44's her heaviest weight ever was 244 lbs. she has significant food cravings issues  she snacks frequently in the evenings she skips meals frequently she is frequently drinking liquids with calories she frequently makes poor food choices she has problems with excessive hunger  she frequently eats larger portions than normal  she has binge eating behaviors she struggles with emotional eating    Fatigue Karen Vazquez feels her energy is lower than it should be. This has worsened with weight gain and has not worsened recently. Karen Vazquez admits to daytime somnolence and admits to waking up still tired. Patient is at risk for obstructive sleep apnea. Patent has a history of symptoms of daytime fatigue, morning fatigue and morning headache. Patient generally gets 4 to 6 hours of sleep per night, and states they generally have restless sleep. Snoring is present. Apneic episodes are present. Epworth Sleepiness Score is 16  Dyspnea on exertion Karen Vazquez notes increasing shortness of breath with exercising and seems to be worsening over time with weight gain. She notes getting out of breath sooner with activity than she used to. This has not gotten worse recently. Karen Vazquez denies  orthopnea.  Vitamin D deficiency Karen Vazquez has a diagnosis of vitamin D deficiency. Her vitamin D labs are low at 27. She is currently taking multi vitamin and admits fatigue. Karen Vazquez denies nausea, vomiting or muscle weakness.  At risk for osteopenia and osteoporosis Karen Vazquez is at higher risk of osteopenia and osteoporosis due to vitamin D deficiency.   Depression Screen Karen Vazquez's Food and Mood (modified PHQ-9) score was  Depression screen PHQ 2/9 07/20/2017  Decreased Interest 3  Down, Depressed, Hopeless 3  PHQ - 2 Score 6  Altered sleeping 2  Tired, decreased energy 3  Change in appetite 3  Feeling bad or failure about yourself  3  Trouble concentrating 3  Moving slowly or fidgety/restless 3  Suicidal thoughts 3  PHQ-9 Score 26  Difficult doing work/chores Somewhat difficult    ALLERGIES: Allergies  Allergen Reactions  . Lisinopril Cough    MEDICATIONS: Current Outpatient Medications on File Prior to Visit  Medication Sig Dispense Refill  . albuterol (PROVENTIL HFA;VENTOLIN HFA) 108 (90 Base) MCG/ACT inhaler Inhale 2 puffs into the lungs every 6 (six) hours as needed for wheezing or shortness of breath. 1 Inhaler 2  . ALPRAZolam (XANAX) 0.5 MG tablet TAKE ONE TABLET BY MOUTH TWO TIMES DAILYAS NEEDED FOR ANXIETY OR SLEEP. 30 tablet 5  . amLODipine (NORVASC) 2.5 MG tablet Take 1 tablet (2.5 mg total) by mouth daily. 30 tablet 5  . ARMOUR THYROID 15 MG tablet TAKE ONE (1) TABLET EACH DAY 30 tablet 5  . ARMOUR THYROID 30 MG tablet TAKE ONE (1) TABLET EACH DAY  30 tablet 5  . benzonatate (TESSALON) 100 MG capsule Take 1 capsule (100 mg total) by mouth 2 (two) times daily as needed for cough. 20 capsule 0  . cholecalciferol (VITAMIN D) 400 units TABS tablet Take 400 Units by mouth.    . meloxicam (MOBIC) 15 MG tablet Take 1 tablet (15 mg total) by mouth daily. 30 tablet 0  . oxybutynin (DITROPAN XL) 5 MG 24 hr tablet Take 1 tablet (5 mg total) by mouth daily as needed. 30 tablet 5  .  Pediatric Multiple Vit-C-FA (FLINSTONES GUMMIES OMEGA-3 DHA PO) Take 1 tablet by mouth daily.    Marland Kitchen rOPINIRole (REQUIP) 4 MG tablet TAKE 1 TABLET (4 MG TOTAL) BY MOUTH EVERY EVENING 30 tablet 12  . zolpidem (AMBIEN) 10 MG tablet Take 1 tablet (10 mg total) by mouth at bedtime as needed for sleep. 90 tablet 1   No current facility-administered medications on file prior to visit.     PAST MEDICAL HISTORY: Past Medical History:  Diagnosis Date  . Anemia   . Anxiety   . Arthritis   . Back pain   . Blood transfusion 2011   at [facility]  . Bulging lumbar disc   . Chest pain   . Depression   . Dry mouth   . Easy bruising   . Excessive thirst   . Fatigue   . Frequent urination   . GERD (gastroesophageal reflux disease)   . Headache   . Heat intolerance   . Hypertension   . Hypothyroidism   . Joint pain   . Leg pain   . Muscle stiffness   . Nervousness   . Palpitations   . Pre-diabetes   . Restless leg syndrome   . Shortness of breath on exertion   . Sleep apnea    does not use c-pap machine  . Stomach ulcer   . Stress   . Swelling of both lower extremities   . Trouble in sleeping   . Vitamin D deficiency   . Weakness     PAST SURGICAL HISTORY: Past Surgical History:  Procedure Laterality Date  . APPENDECTOMY    . BLADDER SUSPENSION  12/29/2010   Procedure: TRANSVAGINAL TAPE (TVT) PROCEDURE;  Surgeon: Daria Pastures;  Location: [facility];  Service: Gynecology;  Laterality: N/A;  . BREAST SURGERY     left breast lumpectomy 1977  . COLONOSCOPY N/A 03/03/2016   Procedure: COLONOSCOPY;  Surgeon: Rogene Houston, MD;  Location: [facility];  Service: Endoscopy;  Laterality: N/A;  2:00 - moved to 12/14 @ 12:55 - Ann notified pt  . CYSTOCELE REPAIR  12/29/2010   Procedure: ANTERIOR REPAIR (CYSTOCELE);  Surgeon: Daria Pastures;  Location: [facility];  Service: Gynecology;  Laterality: N/A;  . CYSTOSCOPY  12/29/2010   Procedure: CYSTOSCOPY;  Surgeon: Daria Pastures;   Location: [facility];  Service: Gynecology;  Laterality: N/A;  . JOINT REPLACEMENT  2011   rt knee  . REPLACEMENT TOTAL KNEE Right 2011  . TONSILLECTOMY    . TOTAL KNEE ARTHROPLASTY Left 11/17/2014   Procedure: LEFT TOTAL KNEE ARTHROPLASTY;  Surgeon: Gaynelle Arabian, MD;  Location: [facility];  Service: Orthopedics;  Laterality: Left;  . TUBAL LIGATION    . VAGINAL HYSTERECTOMY  12/29/2010   Procedure: HYSTERECTOMY VAGINAL;  Surgeon: Daria Pastures;  Location: [facility];  Service: Gynecology;  Laterality: N/A;    SOCIAL HISTORY: Social History   Tobacco Use  . Smoking status: Never Smoker  .  Smokeless tobacco: Never Used  Substance Use Topics  . Alcohol use: No  . Drug use: No    FAMILY HISTORY: Family History  Problem Relation Age of Onset  . Stroke Mother   . Hypertension Mother   . Hyperlipidemia Mother   . Thyroid disease Mother   . Heart disease Father   . COPD Father   . Cancer Father   . Depression Father   . Cancer Paternal Grandmother        breast  . Diabetes Paternal Grandfather     ROS: Review of Systems  Constitutional: Positive for malaise/fatigue.  HENT:       Dry Mouth  Eyes:       Wear Glasses or Contacts  Respiratory: Positive for shortness of breath.   Cardiovascular: Positive for palpitations. Negative for orthopnea.       Positive for Shortness of Breath with Activity Chest Tightness Calf/Leg Pain with Walking   Gastrointestinal: Negative for nausea and vomiting.  Genitourinary: Positive for frequency.  Musculoskeletal: Positive for back pain.       Muscle or Joint Pain Muscle Stiffness Negative for muscle weakness  Neurological: Positive for weakness and headaches.  Endo/Heme/Allergies: Positive for polydipsia. Bruises/bleeds easily (bruising).       Heat/Cold Intolerance  Psychiatric/Behavioral: Positive for depression. The patient is nervous/anxious (nervousness) and has insomnia.        Stress    PHYSICAL EXAM: Blood pressure 133/75,  pulse 70, temperature 97.9 F (36.6 C), temperature source Oral, height 5\' 4"  (1.626 m), weight 239 lb (108.4 kg), last menstrual period 05/24/2010, SpO2 98 %. Body mass index is 41.02 kg/m. Physical Exam  Constitutional: She is oriented to person, place, and time. She appears well-developed and well-nourished.  HENT:  Head: Normocephalic and atraumatic.  Nose: Nose normal.  Eyes: EOM are normal. No scleral icterus.  Neck: Normal range of motion. Neck supple. No thyromegaly present.  Cardiovascular: Normal rate and regular rhythm.  Pulmonary/Chest: Effort normal. No respiratory distress.  Abdominal: Soft. There is no tenderness.  + obesity  Musculoskeletal: Normal range of motion.  Range of Motion normal in all 4 extremities  Neurological: She is alert and oriented to person, place, and time. Coordination normal.  Skin: Skin is warm and dry.  Psychiatric: She has a normal mood and affect. Her behavior is normal.  Vitals reviewed.   RECENT LABS AND TESTS: BMET    Component Value Date/Time   NA 141 07/20/2017 1024   K 4.6 07/20/2017 1024   CL 103 07/20/2017 1024   CO2 25 07/20/2017 1024   GLUCOSE 82 07/20/2017 1024   GLUCOSE 109 (H) 11/19/2014 0520   BUN 11 07/20/2017 1024   CREATININE 0.82 07/20/2017 1024   CREATININE 0.89 12/12/2012 0813   CALCIUM 9.5 07/20/2017 1024   GFRNONAA 77 07/20/2017 1024   GFRAA 89 07/20/2017 1024   Lab Results  Component Value Date   HGBA1C 5.2 07/20/2017   Lab Results  Component Value Date   INSULIN 12.5 07/20/2017   CBC    Component Value Date/Time   WBC 5.3 07/20/2017 1024   WBC 11.6 (H) 11/19/2014 0520   RBC 4.84 07/20/2017 1024   RBC 3.88 11/19/2014 0520   HGB 12.8 07/20/2017 1024   HCT 41.4 07/20/2017 1024   PLT 201 06/20/2017 0907   MCV 86 07/20/2017 1024   MCH 26.4 (L) 07/20/2017 1024   MCH 28.9 11/19/2014 0520   MCHC 30.9 (L) 07/20/2017 1024   MCHC 32.7 11/19/2014  0520   RDW 13.8 07/20/2017 1024   LYMPHSABS 1.5  07/20/2017 1024   MONOABS 0.5 11/27/2013 1131   EOSABS 0.1 07/20/2017 1024   BASOSABS 0.0 07/20/2017 1024   Iron/TIBC/Ferritin/ %Sat    Component Value Date/Time   IRON 78 12/01/2016 0819   IRON 18 (L) 06/30/2016 0924   TIBC 315 12/01/2016 0819   TIBC 497 (H) 06/30/2016 0924   FERRITIN 20 07/14/2017 0828   IRONPCTSAT 25 12/01/2016 0819   Lipid Panel     Component Value Date/Time   CHOL 185 07/20/2017 1024   TRIG 113 07/20/2017 1024   HDL 43 07/20/2017 1024   CHOLHDL 4.8 (H) 06/20/2017 0907   CHOLHDL 4.2 11/21/2013 0712   VLDL 18 11/21/2013 0712   LDLCALC 119 (H) 07/20/2017 1024   Hepatic Function Panel     Component Value Date/Time   PROT 6.7 07/20/2017 1024   ALBUMIN 4.2 07/20/2017 1024   AST 18 07/20/2017 1024   ALT 15 07/20/2017 1024   ALKPHOS 128 (H) 07/20/2017 1024   BILITOT 0.5 07/20/2017 1024   BILIDIR 0.09 06/20/2017 0907   IBILI 0.3 12/12/2012 0813      Component Value Date/Time   TSH 2.030 07/14/2017 0828   Vitamin D Results for YAMIL, DOUGHER (MRN [id number]) as of 07/24/2017 13:33  Ref. Range 06/20/2017 09:07  Vitamin D, 25-Hydroxy Latest Ref Range: 30.0 - 100.0 ng/mL 27.7 (L)    ECG  shows NSR with a rate of 74 BPM INDIRECT CALORIMETER done today shows a VO2 of 201 and a REE of 1397. Her calculated basal metabolic rate is 2595 thus her basal metabolic rate is worse than expected.    ASSESSMENT AND PLAN: Other fatigue - Plan: EKG 12-Lead, CBC With Differential, Comprehensive metabolic panel, Hemoglobin A1c, Insulin, random, Lipid Panel With LDL/HDL Ratio, Vitamin B12, Folate  Dyspnea on exertion  Vitamin D deficiency - Plan: Vitamin D, Ergocalciferol, (DRISDOL) 50000 units CAPS capsule  At risk for osteoporosis  Depression screening  Class 3 severe obesity without serious comorbidity with body mass index (BMI) of 40.0 to 44.9 in adult, unspecified obesity type (HCC)  PLAN:  Fatigue Destenie was informed that her fatigue may be related to  obesity, depression or many other causes. Labs will be ordered, and in the meanwhile Amatullah has agreed to work on diet, exercise and weight loss to help with fatigue. Proper sleep hygiene was discussed including the need for 7-8 hours of quality sleep each night. A sleep study was not ordered based on symptoms and Epworth score.  Dyspnea on exertion Karen Vazquez's shortness of breath appears to be obesity related and exercise induced. She has agreed to work on weight loss and gradually increase exercise to treat her exercise induced shortness of breath. If Kyrstin follows our instructions and loses weight without improvement of her shortness of breath, we will plan to refer to pulmonology. We will monitor this condition regularly. Karen Vazquez agrees to this plan.  Vitamin D Deficiency Karen Vazquez was informed that low vitamin D levels contributes to fatigue and are associated with obesity, breast, and colon cancer. She agrees to continue multi vitamin and start to take prescription Vit D @50 ,000 IU every week #4 with no refills. We will recheck labs in 3 months and she will follow up for routine testing of vitamin D, at least 2-3 times per year. She was informed of the risk of over-replacement of vitamin D and agrees to not increase her dose unless she discusses this with [place] first.  At risk for osteopenia and osteoporosis Karen Vazquez is at risk for osteopenia and osteoporosis due to her vitamin D deficiency. She was encouraged to take her vitamin D and follow her higher calcium diet and increase strengthening exercise to help strengthen her bones and decrease her risk of osteopenia and osteoporosis.  Depression Screen Karen Vazquez had a strongly positive depression screening. Depression is commonly associated with obesity and often results in emotional eating behaviors. We will monitor this closely and work on CBT to help improve the non-hunger eating patterns. Referral to Psychology may be required if no improvement is seen as she  continues in our clinic.  Obesity Karen Vazquez is currently in the action stage of change and her goal is to continue with weight loss efforts She has agreed to follow the Category 2 plan Karen Vazquez has been instructed to work up to a goal of 150 minutes of combined cardio and strengthening exercise per week for weight loss and overall health benefits. We discussed the following Behavioral Modification Strategies today: increasing lean protein intake, decreasing simple carbohydrates , decrease eating out and work on meal planning and easy cooking plans  Karen Vazquez has agreed to follow up with our clinic in 2 weeks. She was informed of the importance of frequent follow up visits to maximize her success with intensive lifestyle modifications for her multiple health conditions. She was informed we would discuss her lab results at her next visit unless there is a critical issue that needs to be addressed sooner. Karen Vazquez agreed to keep her next visit at the agreed upon time to discuss these results.    OBESITY BEHAVIORAL INTERVENTION VISIT  Today's visit was # 1 out of 22.  Starting weight: 239 lbs Starting date: 07/20/17 Today's weight : 239 lbs  Today's date: 07/20/2017 Total lbs lost to date: 0 (Patients must lose 7 lbs in the first 6 months to continue with counseling)   ASK: We discussed the diagnosis of obesity with [name] today and Durene agreed to give [place] permission to discuss obesity behavioral modification therapy today.  ASSESS: Debborah has the diagnosis of obesity and her BMI today is [place] is in the action stage of change   ADVISE: Jaylani was educated on the multiple health risks of obesity as well as the benefit of weight loss to improve her health. She was advised of the need for long term treatment and the importance of lifestyle modifications.  AGREE: Multiple dietary modification options and treatment options were discussed and  Lynette agreed to the above obesity treatment  plan.   I, Doreene Nest, am acting as transcriptionist for Dennard Nip, MD  I have reviewed the above documentation for accuracy and completeness, and I agree with the above. -Dennard Nip, MD

## 2017-08-03 ENCOUNTER — Ambulatory Visit (INDEPENDENT_AMBULATORY_CARE_PROVIDER_SITE_OTHER): Payer: Federal, State, Local not specified - PPO | Admitting: Family Medicine

## 2017-08-03 VITALS — BP 128/74 | HR 65 | Temp 97.9°F | Ht 64.0 in | Wt 231.0 lb

## 2017-08-03 DIAGNOSIS — E559 Vitamin D deficiency, unspecified: Secondary | ICD-10-CM

## 2017-08-03 DIAGNOSIS — E66812 Obesity, class 2: Secondary | ICD-10-CM

## 2017-08-03 DIAGNOSIS — Z9189 Other specified personal risk factors, not elsewhere classified: Secondary | ICD-10-CM | POA: Diagnosis not present

## 2017-08-03 DIAGNOSIS — E8881 Metabolic syndrome: Secondary | ICD-10-CM

## 2017-08-03 DIAGNOSIS — E88819 Insulin resistance, unspecified: Secondary | ICD-10-CM

## 2017-08-03 DIAGNOSIS — Z6839 Body mass index (BMI) 39.0-39.9, adult: Secondary | ICD-10-CM

## 2017-08-03 MED ORDER — VITAMIN D (ERGOCALCIFEROL) 1.25 MG (50000 UNIT) PO CAPS: 50000.0000 [IU] | ORAL_CAPSULE | ORAL | 0 refills | Status: DC

## 2017-08-03 NOTE — Progress Notes (Signed)
Office: [phone]  /  Fax: [phone]   HPI:   Chief Complaint: OBESITY Karen Vazquez is here to discuss her progress with her obesity treatment plan. She is on the Category 2 plan and is following her eating plan approximately 95 % of the time. She states she is doing the stationary bike for 15 minutes 3 times per week. Shahla has done very well with weight loss on her category 2 plan. She di have some late night cravings. Her weight is 231 lb (104.8 kg) today and has had a weight loss of 8 pounds over a period of 2 weeks since her last visit. She has lost 8 lbs since starting treatment with [place].  Vitamin D deficiency Karen Vazquez has a diagnosis of vitamin D deficiency. Karen Vazquez is stable on vit D but she is not yet at goal. Karen Vazquez denies nausea, vomiting or muscle weakness.  Insulin Resistance (new) Karen Vazquez has a new diagnosis of insulin resistance. She has a normal A1c and glucose, but elevated fasting insulin level >5. Although Karen Vazquez's blood glucose readings are still under good control, insulin resistance puts her at greater risk of metabolic syndrome and diabetes. She admits polyphagia. She is not taking metformin currently and continues to work on diet and exercise to decrease risk of diabetes.  At risk for diabetes Karen Vazquez is at higher than average risk for developing diabetes due to her obesity and insulin resistance. She currently denies polyuria or polydipsia.  ALLERGIES: Allergies  Allergen Reactions  . Lisinopril Cough    MEDICATIONS: Current Outpatient Medications on File Prior to Visit  Medication Sig Dispense Refill  . albuterol (PROVENTIL HFA;VENTOLIN HFA) 108 (90 Base) MCG/ACT inhaler Inhale 2 puffs into the lungs every 6 (six) hours as needed for wheezing or shortness of breath. 1 Inhaler 2  . ALPRAZolam (XANAX) 0.5 MG tablet TAKE ONE TABLET BY MOUTH TWO TIMES DAILYAS NEEDED FOR ANXIETY OR SLEEP. 30 tablet 5  . amLODipine (NORVASC) 2.5 MG tablet Take 1 tablet (2.5 mg total) by mouth  daily. 30 tablet 5  . ARMOUR THYROID 15 MG tablet TAKE ONE (1) TABLET EACH DAY 30 tablet 5  . ARMOUR THYROID 30 MG tablet TAKE ONE (1) TABLET EACH DAY 30 tablet 5  . benzonatate (TESSALON) 100 MG capsule Take 1 capsule (100 mg total) by mouth 2 (two) times daily as needed for cough. 20 capsule 0  . meloxicam (MOBIC) 15 MG tablet Take 1 tablet (15 mg total) by mouth daily. 30 tablet 0  . oxybutynin (DITROPAN XL) 5 MG 24 hr tablet Take 1 tablet (5 mg total) by mouth daily as needed. 30 tablet 5  . Pediatric Multiple Vit-C-FA (FLINSTONES GUMMIES OMEGA-3 DHA PO) Take 1 tablet by mouth daily.    Marland Kitchen rOPINIRole (REQUIP) 4 MG tablet TAKE 1 TABLET (4 MG TOTAL) BY MOUTH EVERY EVENING 30 tablet 12  . Vitamin D, Ergocalciferol, (DRISDOL) 50000 units CAPS capsule Take 1 capsule (50,000 Units total) by mouth every 7 (seven) days. 4 capsule 0  . zolpidem (AMBIEN) 10 MG tablet Take 1 tablet (10 mg total) by mouth at bedtime as needed for sleep. 90 tablet 1   No current facility-administered medications on file prior to visit.     PAST MEDICAL HISTORY: Past Medical History:  Diagnosis Date  . Anemia   . Anxiety   . Arthritis   . Back pain   . Blood transfusion 2011   at [facility]  . Bulging lumbar disc   . Chest pain   .  Depression   . Dry mouth   . Easy bruising   . Excessive thirst   . Fatigue   . Frequent urination   . GERD (gastroesophageal reflux disease)   . Headache   . Heat intolerance   . Hypertension   . Hypothyroidism   . Joint pain   . Leg pain   . Muscle stiffness   . Nervousness   . Palpitations   . Pre-diabetes   . Restless leg syndrome   . Shortness of breath on exertion   . Sleep apnea    does not use c-pap machine  . Stomach ulcer   . Stress   . Swelling of both lower extremities   . Trouble in sleeping   . Vitamin D deficiency   . Weakness     PAST SURGICAL HISTORY: Past Surgical History:  Procedure Laterality Date  . APPENDECTOMY    . BLADDER SUSPENSION   12/29/2010   Procedure: TRANSVAGINAL TAPE (TVT) PROCEDURE;  Surgeon: Daria Pastures;  Location: [facility];  Service: Gynecology;  Laterality: N/A;  . BREAST SURGERY     left breast lumpectomy 1977  . COLONOSCOPY N/A 03/03/2016   Procedure: COLONOSCOPY;  Surgeon: Rogene Houston, MD;  [facility];  Service: Endoscopy;  Laterality: N/A;  2:00 - moved to 12/14 @ 12:55 - Ann notified pt  . CYSTOCELE REPAIR  12/29/2010   Procedure: ANTERIOR REPAIR (CYSTOCELE);  Surgeon: Daria Pastures;  Location: [facility];  Service: Gynecology;  Laterality: N/A;  . CYSTOSCOPY  12/29/2010   Procedure: CYSTOSCOPY;  Surgeon: Daria Pastures;  Location: [facility];  Service: Gynecology;  Laterality: N/A;  . JOINT REPLACEMENT  2011   rt knee  . REPLACEMENT TOTAL KNEE Right 2011  . TONSILLECTOMY    . TOTAL KNEE ARTHROPLASTY Left 11/17/2014   Procedure: LEFT TOTAL KNEE ARTHROPLASTY;  Surgeon: Gaynelle Arabian, MD;  Location: [facility];  Service: Orthopedics;  Laterality: Left;  . TUBAL LIGATION    . VAGINAL HYSTERECTOMY  12/29/2010   Procedure: HYSTERECTOMY VAGINAL;  Surgeon: Daria Pastures;  Location: [facility];  Service: Gynecology;  Laterality: N/A;    SOCIAL HISTORY: Social History   Tobacco Use  . Smoking status: Never Smoker  . Smokeless tobacco: Never Used  Substance Use Topics  . Alcohol use: No  . Drug use: No    FAMILY HISTORY: Family History  Problem Relation Age of Onset  . Stroke Mother   . Hypertension Mother   . Hyperlipidemia Mother   . Thyroid disease Mother   . Heart disease Father   . COPD Father   . Cancer Father   . Depression Father   . Cancer Paternal Grandmother        breast  . Diabetes Paternal Grandfather     ROS: Review of Systems  Constitutional: Positive for weight loss.  Gastrointestinal: Negative for nausea and vomiting.  Genitourinary: Negative for frequency.  Musculoskeletal:       Negative for muscle weakness  Endo/Heme/Allergies: Negative for  polydipsia.       Positive for polyphagia    PHYSICAL EXAM: Blood pressure 128/74, pulse 65, temperature 97.9 F (36.6 C), temperature source Oral, height 5\' 4"  (1.626 m), weight 231 lb (104.8 kg), last menstrual period 05/24/2010, SpO2 93 %. Body mass index is 39.65 kg/m. Physical Exam  Constitutional: She is oriented to person, place, and time. She appears well-developed and well-nourished.  Cardiovascular: Normal rate.  Pulmonary/Chest: Effort normal.  Musculoskeletal: Normal range  of motion.  Neurological: She is oriented to person, place, and time.  Skin: Skin is warm and dry.  Psychiatric: She has a normal mood and affect. Her behavior is normal.  Vitals reviewed.   RECENT LABS AND TESTS: BMET    Component Value Date/Time   NA 141 07/20/2017 1024   K 4.6 07/20/2017 1024   CL 103 07/20/2017 1024   CO2 25 07/20/2017 1024   GLUCOSE 82 07/20/2017 1024   GLUCOSE 109 (H) 11/19/2014 0520   BUN 11 07/20/2017 1024   CREATININE 0.82 07/20/2017 1024   CREATININE 0.89 12/12/2012 0813   CALCIUM 9.5 07/20/2017 1024   GFRNONAA 77 07/20/2017 1024   GFRAA 89 07/20/2017 1024   Lab Results  Component Value Date   HGBA1C 5.2 07/20/2017   HGBA1C 5.4 06/12/2014   HGBA1C 5.4 11/21/2013   HGBA1C 5.4 05/01/2013   HGBA1C 5.8 (H) 12/12/2012   Lab Results  Component Value Date   INSULIN 12.5 07/20/2017   CBC    Component Value Date/Time   WBC 5.3 07/20/2017 1024   WBC 11.6 (H) 11/19/2014 0520   RBC 4.84 07/20/2017 1024   RBC 3.88 11/19/2014 0520   HGB 12.8 07/20/2017 1024   HCT 41.4 07/20/2017 1024   PLT 201 06/20/2017 0907   MCV 86 07/20/2017 1024   MCH 26.4 (L) 07/20/2017 1024   MCH 28.9 11/19/2014 0520   MCHC 30.9 (L) 07/20/2017 1024   MCHC 32.7 11/19/2014 0520   RDW 13.8 07/20/2017 1024   LYMPHSABS 1.5 07/20/2017 1024   MONOABS 0.5 11/27/2013 1131   EOSABS 0.1 07/20/2017 1024   BASOSABS 0.0 07/20/2017 1024   Iron/TIBC/Ferritin/ %Sat    Component Value Date/Time     IRON 78 12/01/2016 0819   IRON 18 (L) 06/30/2016 0924   TIBC 315 12/01/2016 0819   TIBC 497 (H) 06/30/2016 0924   FERRITIN 20 07/14/2017 0828   IRONPCTSAT 25 12/01/2016 0819   Lipid Panel     Component Value Date/Time   CHOL 185 07/20/2017 1024   TRIG 113 07/20/2017 1024   HDL 43 07/20/2017 1024   CHOLHDL 4.8 (H) 06/20/2017 0907   CHOLHDL 4.2 11/21/2013 0712   VLDL 18 11/21/2013 0712   LDLCALC 119 (H) 07/20/2017 1024   Hepatic Function Panel     Component Value Date/Time   PROT 6.7 07/20/2017 1024   ALBUMIN 4.2 07/20/2017 1024   AST 18 07/20/2017 1024   ALT 15 07/20/2017 1024   ALKPHOS 128 (H) 07/20/2017 1024   BILITOT 0.5 07/20/2017 1024   BILIDIR 0.09 06/20/2017 0907   IBILI 0.3 12/12/2012 0813      Component Value Date/Time   TSH 2.030 07/14/2017 0828   TSH 2.300 01/10/2017 0826   TSH 2.320 08/12/2015 0926   Results for ADREENA, WILLITS (MRN [id number]) as of 08/03/2017 16:48  Ref. Range 06/20/2017 09:07  Vitamin D, 25-Hydroxy Latest Ref Range: 30.0 - 100.0 ng/mL 27.7 (L)   ASSESSMENT AND PLAN: Insulin resistance  Vitamin D deficiency - Plan: Vitamin D, Ergocalciferol, (DRISDOL) 50000 units CAPS capsule  At risk for diabetes mellitus  Class 2 severe obesity with serious comorbidity and body mass index (BMI) of 39.0 to 39.9 in adult, unspecified obesity type (Karen Vazquez)  PLAN:  Vitamin D Deficiency Karen Vazquez was informed that low vitamin D levels contributes to fatigue and are associated with obesity, breast, and colon cancer. She agrees to continue to take prescription Vit D @50 ,000 IU every week #4 with no refills. We will  recheck labs in 3 months and will follow up for routine testing of vitamin D, at least 2-3 times per year. She was informed of the risk of over-replacement of vitamin D and agrees to not increase her dose unless she discusses this with [place] first. Karen Vazquez agrees to follow up as directed.  Insulin Resistance (new) Karen Vazquez will continue to work on weight  loss, exercise, and decreasing simple carbohydrates in her diet to help decrease the risk of diabetes. She was informed that eating too many simple carbohydrates or too many calories at one sitting increases the likelihood of GI side effects. We will defer metformin for now. Karen Vazquez agreed to follow up with [place] as directed to monitor her progress.  Diabetes risk counseling Karen Vazquez was given extended (30 minutes) diabetes prevention counseling today. She is 62 Vazquez.o. female and has risk factors for diabetes including obesity and insulin resistance. We discussed intensive lifestyle modifications today with an emphasis on weight loss as well as increasing exercise and decreasing simple carbohydrates in her diet.  Obesity Karen Vazquez is currently in the action stage of change. As such, her goal is to continue with weight loss efforts She has agreed to follow the Category 2 plan +563 calories Karen Vazquez has been instructed to work up to a goal of 150 minutes of combined cardio and strengthening exercise per week for weight loss and overall health benefits. We discussed the following Behavioral Modification Strategies today: increasing lean protein intake and decreasing simple carbohydrates   Karen Vazquez has agreed to follow up with our clinic in 2 to 3 weeks. She was informed of the importance of frequent follow up visits to maximize her success with intensive lifestyle modifications for her multiple health conditions.   OBESITY BEHAVIORAL INTERVENTION VISIT  Today's visit was # 2 out of 22.  Starting weight: 239 lbs Starting date: 07/20/17 Today's weight : 231 lbs Today's date: 08/03/2017 Total lbs lost to date: 8 (Patients must lose 7 lbs in the first 6 months to continue with counseling)   ASK: We discussed the diagnosis of obesity with Karen Vazquez today and Karen Vazquez agreed to give [place] permission to discuss obesity behavioral modification therapy today.  ASSESS: Karen Vazquez has the diagnosis of obesity and her BMI today  is 14.97 Karen Vazquez is in the action stage of change   ADVISE: Phoebie was educated on the multiple health risks of obesity as well as the benefit of weight loss to improve her health. She was advised of the need for long term treatment and the importance of lifestyle modifications.  AGREE: Multiple dietary modification options and treatment options were discussed and  Jansen agreed to the above obesity treatment plan.  I, Doreene Nest, am acting as transcriptionist for Dennard Nip, MD  I have reviewed the above documentation for accuracy and completeness, and I agree with the above. -Dennard Nip, MD

## 2017-08-04 ENCOUNTER — Other Ambulatory Visit: Payer: Self-pay | Admitting: Family Medicine

## 2017-08-04 NOTE — Telephone Encounter (Signed)
Ok six mo 

## 2017-08-07 ENCOUNTER — Encounter (INDEPENDENT_AMBULATORY_CARE_PROVIDER_SITE_OTHER): Payer: Self-pay

## 2017-08-07 ENCOUNTER — Telehealth (INDEPENDENT_AMBULATORY_CARE_PROVIDER_SITE_OTHER): Payer: Self-pay | Admitting: Family Medicine

## 2017-08-07 NOTE — Telephone Encounter (Signed)
Sent the patient a my chart message. Karen Vazquez, [place]

## 2017-08-07 NOTE — Telephone Encounter (Signed)
Appointment June 10th, needs refill on vitamin D

## 2017-08-12 DIAGNOSIS — M7672 Peroneal tendinitis, left leg: Secondary | ICD-10-CM | POA: Diagnosis not present

## 2017-08-28 ENCOUNTER — Ambulatory Visit (INDEPENDENT_AMBULATORY_CARE_PROVIDER_SITE_OTHER): Payer: Federal, State, Local not specified - PPO | Admitting: Family Medicine

## 2017-08-28 VITALS — BP 129/76 | HR 70 | Temp 97.7°F | Ht 64.0 in | Wt 223.0 lb

## 2017-08-28 DIAGNOSIS — E8881 Metabolic syndrome: Secondary | ICD-10-CM | POA: Diagnosis not present

## 2017-08-28 DIAGNOSIS — Z6838 Body mass index (BMI) 38.0-38.9, adult: Secondary | ICD-10-CM | POA: Diagnosis not present

## 2017-08-28 NOTE — Progress Notes (Signed)
Office: [phone]  /  Fax: [phone]   HPI:   Chief Complaint: OBESITY Karen Vazquez is here to discuss her progress with her obesity treatment plan. She is on the Category 2 plan +100 calories and is following her eating plan approximately 97 to 98 % of the time. She states she is walking for 30 minutes 2 times per week. Karen Vazquez continues to do well with weight loss on the category 2 plan. She notes increased PM snacking. Karen Vazquez is getting a bit bored with breakfast. Her weight is 223 lb (101.2 kg) today and has had a weight loss of 8 pounds over a period of 2 weeks since her last visit. She has lost 16 lbs since starting treatment with [place].  Insulin Resistance Karen Vazquez has a diagnosis of insulin resistance based on her elevated fasting insulin level >5. Although Karen Vazquez's blood glucose readings are still under good control, insulin resistance puts her at greater risk of metabolic syndrome and diabetes. She is not taking metformin currently and is attempting to improve her insulin resistance with diet and exercise. She still notes some PM polyphagia, but she may be on too few calories. Karen Vazquez continues to work on diet and exercise to decrease risk of diabetes.  ALLERGIES: Allergies  Allergen Reactions  . Lisinopril Cough    MEDICATIONS: Current Outpatient Medications on File Prior to Visit  Medication Sig Dispense Refill  . albuterol (PROVENTIL HFA;VENTOLIN HFA) 108 (90 Base) MCG/ACT inhaler Inhale 2 puffs into the lungs every 6 (six) hours as needed for wheezing or shortness of breath. 1 Inhaler 2  . ALPRAZolam (XANAX) 0.5 MG tablet TAKE ONE TABLET BY MOUTH TWO TIMES DAILYAS NEEDED FOR ANXIETY OR SLEEP. 30 tablet 5  . amLODipine (NORVASC) 2.5 MG tablet Take 1 tablet (2.5 mg total) by mouth daily. 30 tablet 5  . ARMOUR THYROID 15 MG tablet TAKE ONE (1) TABLET EACH DAY 30 tablet 5  . ARMOUR THYROID 30 MG tablet TAKE ONE (1) TABLET EACH DAY 30 tablet 5  . benzonatate (TESSALON) 100 MG capsule Take  1 capsule (100 mg total) by mouth 2 (two) times daily as needed for cough. 20 capsule 0  . meloxicam (MOBIC) 15 MG tablet Take 1 tablet (15 mg total) by mouth daily. 30 tablet 0  . oxybutynin (DITROPAN XL) 5 MG 24 hr tablet Take 1 tablet (5 mg total) by mouth daily as needed. 30 tablet 5  . Pediatric Multiple Vit-C-FA (FLINSTONES GUMMIES OMEGA-3 DHA PO) Take 1 tablet by mouth daily.    Marland Kitchen rOPINIRole (REQUIP) 4 MG tablet TAKE 1 TABLET (4 MG TOTAL) BY MOUTH EVERY EVENING 30 tablet 12  . Vitamin D, Ergocalciferol, (DRISDOL) 50000 units CAPS capsule Take 1 capsule (50,000 Units total) by mouth every 7 (seven) days. 4 capsule 0  . zolpidem (AMBIEN) 10 MG tablet TAKE 1 TABLET (10 MG TOTAL) BY MOUTH AT BEDTIME AS NEEDED FOR SLEEP 90 tablet 1   No current facility-administered medications on file prior to visit.     PAST MEDICAL HISTORY: Past Medical History:  Diagnosis Date  . Anemia   . Anxiety   . Arthritis   . Back pain   . Blood transfusion 2011   at [facility]  . Bulging lumbar disc   . Chest pain   . Depression   . Dry mouth   . Easy bruising   . Excessive thirst   . Fatigue   . Frequent urination   . GERD (gastroesophageal reflux disease)   . Headache   .  Heat intolerance   . Hypertension   . Hypothyroidism   . Joint pain   . Leg pain   . Muscle stiffness   . Nervousness   . Palpitations   . Pre-diabetes   . Restless leg syndrome   . Shortness of breath on exertion   . Sleep apnea    does not use c-pap machine  . Stomach ulcer   . Stress   . Swelling of both lower extremities   . Trouble in sleeping   . Vitamin D deficiency   . Weakness     PAST SURGICAL HISTORY: Past Surgical History:  Procedure Laterality Date  . APPENDECTOMY    . BLADDER SUSPENSION  12/29/2010   Procedure: TRANSVAGINAL TAPE (TVT) PROCEDURE;  Surgeon: Daria Pastures;  Location: [facility];  Service: Gynecology;  Laterality: N/A;  . BREAST SURGERY     left breast lumpectomy 1977  . COLONOSCOPY N/A  03/03/2016   Procedure: COLONOSCOPY;  Surgeon: Rogene Houston, MD;  [facility];  Service: Endoscopy;  Laterality: N/A;  2:00 - moved to 12/14 @ 12:55 - Ann notified pt  . CYSTOCELE REPAIR  12/29/2010   Procedure: ANTERIOR REPAIR (CYSTOCELE);  Surgeon: Daria Pastures;  Location: [facility];  Service: Gynecology;  Laterality: N/A;  . CYSTOSCOPY  12/29/2010   Procedure: CYSTOSCOPY;  Surgeon: Daria Pastures;  Location: [facility];  Service: Gynecology;  Laterality: N/A;  . JOINT REPLACEMENT  2011   rt knee  . REPLACEMENT TOTAL KNEE Right 2011  . TONSILLECTOMY    . TOTAL KNEE ARTHROPLASTY Left 11/17/2014   Procedure: LEFT TOTAL KNEE ARTHROPLASTY;  Surgeon: Gaynelle Arabian, MD;  Location[facility];  Service: Orthopedics;  Laterality: Left;  . TUBAL LIGATION    . VAGINAL HYSTERECTOMY  12/29/2010   Procedure: HYSTERECTOMY VAGINAL;  Surgeon: Daria Pastures;  Location: [facility];  Service: Gynecology;  Laterality: N/A;    SOCIAL HISTORY: Social History   Tobacco Use  . Smoking status: Never Smoker  . Smokeless tobacco: Never Used  Substance Use Topics  . Alcohol use: No  . Drug use: No    FAMILY HISTORY: Family History  Problem Relation Age of Onset  . Stroke Mother   . Hypertension Mother   . Hyperlipidemia Mother   . Thyroid disease Mother   . Heart disease Father   . COPD Father   . Cancer Father   . Depression Father   . Cancer Paternal Grandmother        breast  . Diabetes Paternal Grandfather     ROS: Review of Systems  Constitutional: Positive for weight loss.  Endo/Heme/Allergies:       Positive for polyphagia    PHYSICAL EXAM: Blood pressure 129/76, pulse 70, temperature 97.7 F (36.5 C), temperature source Oral, height 5\' 4"  (1.626 m), weight 223 lb (101.2 kg), last menstrual period 05/24/2010, SpO2 99 %. Body mass index is 38.28 kg/m. Physical Exam  Constitutional: She is oriented to person, place, and time. She appears well-developed and  well-nourished.  Cardiovascular: Normal rate.  Pulmonary/Chest: Effort normal.  Musculoskeletal: Normal range of motion.  Neurological: She is oriented to person, place, and time.  Skin: Skin is warm and dry.  Psychiatric: She has a normal mood and affect. Her behavior is normal.  Vitals reviewed.   RECENT LABS AND TESTS: BMET    Component Value Date/Time   NA 141 07/20/2017 1024   K 4.6 07/20/2017 1024   CL 103 07/20/2017 1024  CO2 25 07/20/2017 1024   GLUCOSE 82 07/20/2017 1024   GLUCOSE 109 (H) 11/19/2014 0520   BUN 11 07/20/2017 1024   CREATININE 0.82 07/20/2017 1024   CREATININE 0.89 12/12/2012 0813   CALCIUM 9.5 07/20/2017 1024   GFRNONAA 77 07/20/2017 1024   GFRAA 89 07/20/2017 1024   Lab Results  Component Value Date   HGBA1C 5.2 07/20/2017   HGBA1C 5.4 06/12/2014   HGBA1C 5.4 11/21/2013   HGBA1C 5.4 05/01/2013   HGBA1C 5.8 (H) 12/12/2012   Lab Results  Component Value Date   INSULIN 12.5 07/20/2017   CBC    Component Value Date/Time   WBC 5.3 07/20/2017 1024   WBC 11.6 (H) 11/19/2014 0520   RBC 4.84 07/20/2017 1024   RBC 3.88 11/19/2014 0520   HGB 12.8 07/20/2017 1024   HCT 41.4 07/20/2017 1024   PLT 201 06/20/2017 0907   MCV 86 07/20/2017 1024   MCH 26.4 (L) 07/20/2017 1024   MCH 28.9 11/19/2014 0520   MCHC 30.9 (L) 07/20/2017 1024   MCHC 32.7 11/19/2014 0520   RDW 13.8 07/20/2017 1024   LYMPHSABS 1.5 07/20/2017 1024   MONOABS 0.5 11/27/2013 1131   EOSABS 0.1 07/20/2017 1024   BASOSABS 0.0 07/20/2017 1024   Iron/TIBC/Ferritin/ %Sat    Component Value Date/Time   IRON 78 12/01/2016 0819   IRON 18 (L) 06/30/2016 0924   TIBC 315 12/01/2016 0819   TIBC 497 (H) 06/30/2016 0924   FERRITIN 20 07/14/2017 0828   IRONPCTSAT 25 12/01/2016 0819   Lipid Panel     Component Value Date/Time   CHOL 185 07/20/2017 1024   TRIG 113 07/20/2017 1024   HDL 43 07/20/2017 1024   CHOLHDL 4.8 (H) 06/20/2017 0907   CHOLHDL 4.2 11/21/2013 0712   VLDL 18  11/21/2013 0712   LDLCALC 119 (H) 07/20/2017 1024   Hepatic Function Panel     Component Value Date/Time   PROT 6.7 07/20/2017 1024   ALBUMIN 4.2 07/20/2017 1024   AST 18 07/20/2017 1024   ALT 15 07/20/2017 1024   ALKPHOS 128 (H) 07/20/2017 1024   BILITOT 0.5 07/20/2017 1024   BILIDIR 0.09 06/20/2017 0907   IBILI 0.3 12/12/2012 0813      Component Value Date/Time   TSH 2.030 07/14/2017 0828   TSH 2.300 01/10/2017 0826   TSH 2.320 08/12/2015 0926   Results for VIBHA, FERDIG (MRN [id number]) as of 08/28/2017 17:46  Ref. Range 06/20/2017 09:07  Vitamin D, 25-Hydroxy Latest Ref Range: 30.0 - 100.0 ng/mL 27.7 (L)   ASSESSMENT AND PLAN: Insulin resistance  Class 2 severe obesity with serious comorbidity and body mass index (BMI) of 38.0 to 38.9 in adult, unspecified obesity type ([place])  PLAN:  Insulin Resistance Tehani will increase snack calories by 100 calories per day and continue to work on weight loss, exercise, and decreasing simple carbohydrates in her diet to help decrease the risk of diabetes. She was informed that eating too many simple carbohydrates or too many calories at one sitting increases the likelihood of GI side effects.  Grayce agreed to follow up with [place] as directed to monitor her progress.  We spent > than 50% of the 15 minute visit on the counseling as documented in the note.  Obesity Kalila is currently in the action stage of change. As such, her goal is to continue with weight loss efforts She has agreed to follow the Category 2 plan with breakfast options, plus 100 extra calories Emya has been  instructed to work up to a goal of 150 minutes of combined cardio and strengthening exercise per week for weight loss and overall health benefits. We discussed the following Behavioral Modification Strategies today: increasing lean protein intake and decreasing simple carbohydrates   Amorie has agreed to follow up with our clinic in 2 to 3 weeks. She was informed of  the importance of frequent follow up visits to maximize her success with intensive lifestyle modifications for her multiple health conditions.   OBESITY BEHAVIORAL INTERVENTION VISIT  Today's visit was # 3 out of 22.  Starting weight: 239 lbs Starting date: 07/20/17 Today's weight : 223 lbs  Today's date: 08/28/2017 Total lbs lost to date: 16 (Patients must lose 7 lbs in the first 6 months to continue with counseling)   ASK: We discussed the diagnosis of obesity with [name] today and [place] agreed to give [place] permission to discuss obesity behavioral modification therapy today.  ASSESS: Odell has the diagnosis of obesity and her BMI today is 89.16 Rowene is in the action stage of change   ADVISE: Kaylean was educated on the multiple health risks of obesity as well as the benefit of weight loss to improve her health. She was advised of the need for long term treatment and the importance of lifestyle modifications.  AGREE: Multiple dietary modification options and treatment options were discussed and  Phelicia agreed to the above obesity treatment plan.  I, Doreene Nest, am acting as transcriptionist for Dennard Nip, MD  I have reviewed the above documentation for accuracy and completeness, and I agree with the above. -Dennard Nip, MD

## 2017-09-13 ENCOUNTER — Ambulatory Visit (INDEPENDENT_AMBULATORY_CARE_PROVIDER_SITE_OTHER): Payer: Federal, State, Local not specified - PPO | Admitting: Family Medicine

## 2017-09-13 VITALS — BP 132/78 | HR 62 | Temp 97.7°F | Ht 64.0 in | Wt 221.0 lb

## 2017-09-13 DIAGNOSIS — Z6838 Body mass index (BMI) 38.0-38.9, adult: Secondary | ICD-10-CM | POA: Diagnosis not present

## 2017-09-13 DIAGNOSIS — E559 Vitamin D deficiency, unspecified: Secondary | ICD-10-CM | POA: Diagnosis not present

## 2017-09-13 DIAGNOSIS — Z9189 Other specified personal risk factors, not elsewhere classified: Secondary | ICD-10-CM

## 2017-09-13 MED ORDER — VITAMIN D (ERGOCALCIFEROL) 1.25 MG (50000 UNIT) PO CAPS: 50000.0000 [IU] | ORAL_CAPSULE | ORAL | 0 refills | Status: DC

## 2017-09-13 NOTE — Progress Notes (Signed)
Office: [phone]  /  Fax: [phone]   HPI:   Chief Complaint: OBESITY Karen Vazquez is here to discuss her progress with her obesity treatment plan. She is on the Category 2 plan with breakfast options, plus 100 extra calories and is following her eating plan approximately 90 % of the time. She states she is riding stationary bike and walking for 30 minutes 4 times per week. Karen Vazquez was on vacation and still did well with weight loss. She notes increased hunger while exercising more in the last 2 weeks. She is going on 3 more vacations this summer.  Her weight is 221 lb (100.2 kg) today and has had a weight loss of 2 pounds over a period of 2 weeks since her last visit. She has lost 18 lbs since starting treatment with [place].  Vitamin D Deficiency Karen Vazquez has a diagnosis of vitamin D deficiency. She is stable on prescription Vit D, note yet at goal. She notes fatigue is improving and denies nausea, vomiting or muscle weakness.  At risk for osteopenia and osteoporosis Karen Vazquez is at higher risk of osteopenia and osteoporosis due to vitamin D deficiency.   ALLERGIES: Allergies  Allergen Reactions  . Lisinopril Cough    MEDICATIONS: Current Outpatient Medications on File Prior to Visit  Medication Sig Dispense Refill  . albuterol (PROVENTIL HFA;VENTOLIN HFA) 108 (90 Base) MCG/ACT inhaler Inhale 2 puffs into the lungs every 6 (six) hours as needed for wheezing or shortness of breath. 1 Inhaler 2  . ALPRAZolam (XANAX) 0.5 MG tablet TAKE ONE TABLET BY MOUTH TWO TIMES DAILYAS NEEDED FOR ANXIETY OR SLEEP. 30 tablet 5  . amLODipine (NORVASC) 2.5 MG tablet Take 1 tablet (2.5 mg total) by mouth daily. 30 tablet 5  . ARMOUR THYROID 15 MG tablet TAKE ONE (1) TABLET EACH DAY 30 tablet 5  . ARMOUR THYROID 30 MG tablet TAKE ONE (1) TABLET EACH DAY 30 tablet 5  . benzonatate (TESSALON) 100 MG capsule Take 1 capsule (100 mg total) by mouth 2 (two) times daily as needed for cough. 20 capsule 0  . meloxicam  (MOBIC) 15 MG tablet Take 1 tablet (15 mg total) by mouth daily. 30 tablet 0  . oxybutynin (DITROPAN XL) 5 MG 24 hr tablet Take 1 tablet (5 mg total) by mouth daily as needed. 30 tablet 5  . Pediatric Multiple Vit-C-FA (FLINSTONES GUMMIES OMEGA-3 DHA PO) Take 1 tablet by mouth daily.    Marland Kitchen rOPINIRole (REQUIP) 4 MG tablet TAKE 1 TABLET (4 MG TOTAL) BY MOUTH EVERY EVENING 30 tablet 12  . zolpidem (AMBIEN) 10 MG tablet TAKE 1 TABLET (10 MG TOTAL) BY MOUTH AT BEDTIME AS NEEDED FOR SLEEP 90 tablet 1   No current facility-administered medications on file prior to visit.     PAST MEDICAL HISTORY: Past Medical History:  Diagnosis Date  . Anemia   . Anxiety   . Arthritis   . Back pain   . Blood transfusion 2011   at [facility]  . Bulging lumbar disc   . Chest pain   . Depression   . Dry mouth   . Easy bruising   . Excessive thirst   . Fatigue   . Frequent urination   . GERD (gastroesophageal reflux disease)   . Headache   . Heat intolerance   . Hypertension   . Hypothyroidism   . Joint pain   . Leg pain   . Muscle stiffness   . Nervousness   . Palpitations   .  Pre-diabetes   . Restless leg syndrome   . Shortness of breath on exertion   . Sleep apnea    does not use c-pap machine  . Stomach ulcer   . Stress   . Swelling of both lower extremities   . Trouble in sleeping   . Vitamin D deficiency   . Weakness     PAST SURGICAL HISTORY: Past Surgical History:  Procedure Laterality Date  . APPENDECTOMY    . BLADDER SUSPENSION  12/29/2010   Procedure: TRANSVAGINAL TAPE (TVT) PROCEDURE;  Surgeon: Daria Pastures;  Location: [facility];  Service: Gynecology;  Laterality: N/A;  . BREAST SURGERY     left breast lumpectomy 1977  . COLONOSCOPY N/A 03/03/2016   Procedure: COLONOSCOPY;  Surgeon: Rogene Houston, MD;  [facility];  Service: Endoscopy;  Laterality: N/A;  2:00 - moved to 12/14 @ 12:55 - Ann notified pt  . CYSTOCELE REPAIR  12/29/2010   Procedure: ANTERIOR REPAIR  (CYSTOCELE);  Surgeon: Daria Pastures;  Location: [facility];  Service: Gynecology;  Laterality: N/A;  . CYSTOSCOPY  12/29/2010   Procedure: CYSTOSCOPY;  Surgeon: Daria Pastures;  Location: [facility];  Service: Gynecology;  Laterality: N/A;  . JOINT REPLACEMENT  2011   rt knee  . REPLACEMENT TOTAL KNEE Right 2011  . TONSILLECTOMY    . TOTAL KNEE ARTHROPLASTY Left 11/17/2014   Procedure: LEFT TOTAL KNEE ARTHROPLASTY;  Surgeon: Gaynelle Arabian, MD;  Location: [facility];  Service: Orthopedics;  Laterality: Left;  . TUBAL LIGATION    . VAGINAL HYSTERECTOMY  12/29/2010   Procedure: HYSTERECTOMY VAGINAL;  Surgeon: Daria Pastures;  Location: [facility];  Service: Gynecology;  Laterality: N/A;    SOCIAL HISTORY: Social History   Tobacco Use  . Smoking status: Never Smoker  . Smokeless tobacco: Never Used  Substance Use Topics  . Alcohol use: No  . Drug use: No    FAMILY HISTORY: Family History  Problem Relation Age of Onset  . Stroke Mother   . Hypertension Mother   . Hyperlipidemia Mother   . Thyroid disease Mother   . Heart disease Father   . COPD Father   . Cancer Father   . Depression Father   . Cancer Paternal Grandmother        breast  . Diabetes Paternal Grandfather     ROS: Review of Systems  Constitutional: Positive for malaise/fatigue and weight loss.  Gastrointestinal: Negative for nausea and vomiting.  Musculoskeletal:       Negative muscle weakness    PHYSICAL EXAM: Blood pressure 132/78, pulse 62, temperature 97.7 F (36.5 C), temperature source Oral, height 5\' 4"  (1.626 m), weight 221 lb (100.2 kg), last menstrual period 05/24/2010, SpO2 98 %. Body mass index is 37.93 kg/m. Physical Exam  Constitutional: She is oriented to person, place, and time. She appears well-developed and well-nourished.  Cardiovascular: Normal rate.  Pulmonary/Chest: Effort normal.  Musculoskeletal: Normal range of motion.  Neurological: She is oriented to person, place, and time.    Skin: Skin is warm and dry.  Psychiatric: She has a normal mood and affect. Her behavior is normal.  Vitals reviewed.   RECENT LABS AND TESTS: BMET    Component Value Date/Time   NA 141 07/20/2017 1024   K 4.6 07/20/2017 1024   CL 103 07/20/2017 1024   CO2 25 07/20/2017 1024   GLUCOSE 82 07/20/2017 1024   GLUCOSE 109 (H) 11/19/2014 0520   BUN 11 07/20/2017 1024  CREATININE 0.82 07/20/2017 1024   CREATININE 0.89 12/12/2012 0813   CALCIUM 9.5 07/20/2017 1024   GFRNONAA 77 07/20/2017 1024   GFRAA 89 07/20/2017 1024   Lab Results  Component Value Date   HGBA1C 5.2 07/20/2017   HGBA1C 5.4 06/12/2014   HGBA1C 5.4 11/21/2013   HGBA1C 5.4 05/01/2013   HGBA1C 5.8 (H) 12/12/2012   Lab Results  Component Value Date   INSULIN 12.5 07/20/2017   CBC    Component Value Date/Time   WBC 5.3 07/20/2017 1024   WBC 11.6 (H) 11/19/2014 0520   RBC 4.84 07/20/2017 1024   RBC 3.88 11/19/2014 0520   HGB 12.8 07/20/2017 1024   HCT 41.4 07/20/2017 1024   PLT 201 06/20/2017 0907   MCV 86 07/20/2017 1024   MCH 26.4 (L) 07/20/2017 1024   MCH 28.9 11/19/2014 0520   MCHC 30.9 (L) 07/20/2017 1024   MCHC 32.7 11/19/2014 0520   RDW 13.8 07/20/2017 1024   LYMPHSABS 1.5 07/20/2017 1024   MONOABS 0.5 11/27/2013 1131   EOSABS 0.1 07/20/2017 1024   BASOSABS 0.0 07/20/2017 1024   Iron/TIBC/Ferritin/ %Sat    Component Value Date/Time   IRON 78 12/01/2016 0819   IRON 18 (L) 06/30/2016 0924   TIBC 315 12/01/2016 0819   TIBC 497 (H) 06/30/2016 0924   FERRITIN 20 07/14/2017 0828   IRONPCTSAT 25 12/01/2016 0819   Lipid Panel     Component Value Date/Time   CHOL 185 07/20/2017 1024   TRIG 113 07/20/2017 1024   HDL 43 07/20/2017 1024   CHOLHDL 4.8 (H) 06/20/2017 0907   CHOLHDL 4.2 11/21/2013 0712   VLDL 18 11/21/2013 0712   LDLCALC 119 (H) 07/20/2017 1024   Hepatic Function Panel     Component Value Date/Time   PROT 6.7 07/20/2017 1024   ALBUMIN 4.2 07/20/2017 1024   AST 18  07/20/2017 1024   ALT 15 07/20/2017 1024   ALKPHOS 128 (H) 07/20/2017 1024   BILITOT 0.5 07/20/2017 1024   BILIDIR 0.09 06/20/2017 0907   IBILI 0.3 12/12/2012 0813      Component Value Date/Time   TSH 2.030 07/14/2017 0828   TSH 2.300 01/10/2017 0826   TSH 2.320 08/12/2015 0926  Results for MERLIE, NOGA (MRN [id number]) as of 09/13/2017 14:19  Ref. Range 06/20/2017 09:07  Vitamin D, 25-Hydroxy Latest Ref Range: 30.0 - 100.0 ng/mL 27.7 (L)    ASSESSMENT AND PLAN: Vitamin D deficiency - Plan: Vitamin D, Ergocalciferol, (DRISDOL) 50000 units CAPS capsule  At risk for osteoporosis  Class 2 severe obesity with serious comorbidity and body mass index (BMI) of 38.0 to 38.9 in adult, unspecified obesity type ([place])  PLAN:  Vitamin D Deficiency Karen Vazquez was informed that low vitamin D levels contributes to fatigue and are associated with obesity, breast, and colon cancer. Karen Vazquez agrees to continue taking prescription Vit D @50 ,000 IU every week #4 and we will refill for 1 month. She will follow up for routine testing of vitamin D, at least 2-3 times per year. She was informed of the risk of over-replacement of vitamin D and agrees to not increase her dose unless she discusses this with [place] first. We will recheck labs in 1 month and Karen Vazquez agrees to follow up with our clinic in 3 weeks.  At risk for osteopenia and osteoporosis Karen Vazquez is at risk for osteopenia and osteoporsis due to her vitamin D deficiency. She was encouraged to take her vitamin D and follow her higher calcium diet and increase  strengthening exercise to help strengthen her bones and decrease her risk of osteopenia and osteoporosis.  Obesity Karen Vazquez is currently in the action stage of change. As such, her goal is to continue with weight loss efforts She has agreed to follow the Category 2 plan with breakfast options Karen Vazquez has been instructed to work up to a goal of 150 minutes of combined cardio and strengthening exercise per week for  weight loss and overall health benefits. We discussed the following Behavioral Modification Strategies today: increasing lean protein intake, decreasing simple carbohydrates, work on meal planning and easy cooking plans, travel eating strategies, and celebration eating strategies   Karen Vazquez has agreed to follow up with our clinic in 3 weeks. She was informed of the importance of frequent follow up visits to maximize her success with intensive lifestyle modifications for her multiple health conditions.   OBESITY BEHAVIORAL INTERVENTION VISIT  Today's visit was # 4 out of 22.  Starting weight: 239 lbs Starting date: 07/20/17 Today's weight : 221 lbs  Today's date: 09/13/2017 Total lbs lost to date: 6 (Patients must lose 7 lbs in the first 6 months to continue with counseling)   ASK: We discussed the diagnosis of obesity with Karen Vazquez today and Karen Vazquez agreed to give [place] permission to discuss obesity behavioral modification therapy today.  ASSESS: Karen Vazquez has the diagnosis of obesity and her BMI today is 24.58 Karen Vazquez is in the action stage of change   ADVISE: Karen Vazquez was educated on the multiple health risks of obesity as well as the benefit of weight loss to improve her health. She was advised of the need for long term treatment and the importance of lifestyle modifications.  AGREE: Multiple dietary modification options and treatment options were discussed and  Karen Vazquez agreed to the above obesity treatment plan.  I, Trixie Dredge, am acting as transcriptionist for Dennard Nip, MD  I have reviewed the above documentation for accuracy and completeness, and I agree with the above. -Dennard Nip, MD

## 2017-10-03 ENCOUNTER — Ambulatory Visit (INDEPENDENT_AMBULATORY_CARE_PROVIDER_SITE_OTHER): Payer: Federal, State, Local not specified - PPO | Admitting: Family Medicine

## 2017-10-03 VITALS — BP 120/70 | HR 70 | Temp 98.0°F | Ht 64.0 in | Wt 220.0 lb

## 2017-10-03 DIAGNOSIS — E559 Vitamin D deficiency, unspecified: Secondary | ICD-10-CM

## 2017-10-03 DIAGNOSIS — Z6837 Body mass index (BMI) 37.0-37.9, adult: Secondary | ICD-10-CM

## 2017-10-03 DIAGNOSIS — Z9189 Other specified personal risk factors, not elsewhere classified: Secondary | ICD-10-CM

## 2017-10-03 MED ORDER — VITAMIN D (ERGOCALCIFEROL) 1.25 MG (50000 UNIT) PO CAPS: 50000.0000 [IU] | ORAL_CAPSULE | ORAL | 0 refills | Status: DC

## 2017-10-03 NOTE — Progress Notes (Signed)
Office: [phone]  /  Fax: [phone]   HPI:   Chief Complaint: OBESITY Karen Vazquez is here to discuss her progress with her obesity treatment plan. She is on the Category 2 plan with breakfast options and is following her eating plan approximately 90 % of the time. She states she is swimming and walking 30 minutes 3 times per week. Karen Vazquez did well with weight loss, even while on vacation. She died well with controlling her portions and making smarter food choices. She is doing more traveling this month. Her weight is 220 lb (99.8 kg) today and has had a weight loss of 1 pound over a period of 3 weeks since her last visit. She has lost 19 lbs since starting treatment with [place].  Vitamin D deficiency Karen Vazquez has a diagnosis of vitamin D deficiency. Karen Vazquez is stable on vit D but she is not yet at goal. Karen Vazquez denies nausea, vomiting or muscle weakness.  At risk for osteopenia and osteoporosis Karen Vazquez is at higher risk of osteopenia and osteoporosis due to vitamin D deficiency.   ALLERGIES: Allergies  Allergen Reactions  . Lisinopril Cough    MEDICATIONS: Current Outpatient Medications on File Prior to Visit  Medication Sig Dispense Refill  . albuterol (PROVENTIL HFA;VENTOLIN HFA) 108 (90 Base) MCG/ACT inhaler Inhale 2 puffs into the lungs every 6 (six) hours as needed for wheezing or shortness of breath. 1 Inhaler 2  . ALPRAZolam (XANAX) 0.5 MG tablet TAKE ONE TABLET BY MOUTH TWO TIMES DAILYAS NEEDED FOR ANXIETY OR SLEEP. 30 tablet 5  . amLODipine (NORVASC) 2.5 MG tablet Take 1 tablet (2.5 mg total) by mouth daily. 30 tablet 5  . ARMOUR THYROID 15 MG tablet TAKE ONE (1) TABLET EACH DAY 30 tablet 5  . ARMOUR THYROID 30 MG tablet TAKE ONE (1) TABLET EACH DAY 30 tablet 5  . benzonatate (TESSALON) 100 MG capsule Take 1 capsule (100 mg total) by mouth 2 (two) times daily as needed for cough. 20 capsule 0  . meloxicam (MOBIC) 15 MG tablet Take 1 tablet (15 mg total) by mouth daily. 30 tablet 0  .  oxybutynin (DITROPAN XL) 5 MG 24 hr tablet Take 1 tablet (5 mg total) by mouth daily as needed. 30 tablet 5  . Pediatric Multiple Vit-C-FA (FLINSTONES GUMMIES OMEGA-3 DHA PO) Take 1 tablet by mouth daily.    Marland Kitchen rOPINIRole (REQUIP) 4 MG tablet TAKE 1 TABLET (4 MG TOTAL) BY MOUTH EVERY EVENING 30 tablet 12  . zolpidem (AMBIEN) 10 MG tablet TAKE 1 TABLET (10 MG TOTAL) BY MOUTH AT BEDTIME AS NEEDED FOR SLEEP 90 tablet 1   No current facility-administered medications on file prior to visit.     PAST MEDICAL HISTORY: Past Medical History:  Diagnosis Date  . Anemia   . Anxiety   . Arthritis   . Back pain   . Blood transfusion 2011   at [facility]  . Bulging lumbar disc   . Chest pain   . Depression   . Dry mouth   . Easy bruising   . Excessive thirst   . Fatigue   . Frequent urination   . GERD (gastroesophageal reflux disease)   . Headache   . Heat intolerance   . Hypertension   . Hypothyroidism   . Joint pain   . Leg pain   . Muscle stiffness   . Nervousness   . Palpitations   . Pre-diabetes   . Restless leg syndrome   . Shortness of breath on  exertion   . Sleep apnea    does not use c-pap machine  . Stomach ulcer   . Stress   . Swelling of both lower extremities   . Trouble in sleeping   . Vitamin D deficiency   . Weakness     PAST SURGICAL HISTORY: Past Surgical History:  Procedure Laterality Date  . APPENDECTOMY    . BLADDER SUSPENSION  12/29/2010   Procedure: TRANSVAGINAL TAPE (TVT) PROCEDURE;  Surgeon: Daria Pastures;  Location: [facility];  Service: Gynecology;  Laterality: N/A;  . BREAST SURGERY     left breast lumpectomy 1977  . COLONOSCOPY N/A 03/03/2016   Procedure: COLONOSCOPY;  Surgeon: Rogene Houston, MD;  [facility];  Service: Endoscopy;  Laterality: N/A;  2:00 - moved to 12/14 @ 12:55 - Ann notified pt  . CYSTOCELE REPAIR  12/29/2010   Procedure: ANTERIOR REPAIR (CYSTOCELE);  Surgeon: Daria Pastures;  Location: [facility];  Service:  Gynecology;  Laterality: N/A;  . CYSTOSCOPY  12/29/2010   Procedure: CYSTOSCOPY;  Surgeon: Daria Pastures;  Location: [facility];  Service: Gynecology;  Laterality: N/A;  . JOINT REPLACEMENT  2011   rt knee  . REPLACEMENT TOTAL KNEE Right 2011  . TONSILLECTOMY    . TOTAL KNEE ARTHROPLASTY Left 11/17/2014   Procedure: LEFT TOTAL KNEE ARTHROPLASTY;  Surgeon: Gaynelle Arabian, MD;  Location: [facility];  Service: Orthopedics;  Laterality: Left;  . TUBAL LIGATION    . VAGINAL HYSTERECTOMY  12/29/2010   Procedure: HYSTERECTOMY VAGINAL;  Surgeon: Daria Pastures;  Location: [facility];  Service: Gynecology;  Laterality: N/A;    SOCIAL HISTORY: Social History   Tobacco Use  . Smoking status: Never Smoker  . Smokeless tobacco: Never Used  Substance Use Topics  . Alcohol use: No  . Drug use: No    FAMILY HISTORY: Family History  Problem Relation Age of Onset  . Stroke Mother   . Hypertension Mother   . Hyperlipidemia Mother   . Thyroid disease Mother   . Heart disease Father   . COPD Father   . Cancer Father   . Depression Father   . Cancer Paternal Grandmother        breast  . Diabetes Paternal Grandfather     ROS: Review of Systems  Constitutional: Positive for weight loss.  Gastrointestinal: Negative for nausea and vomiting.  Musculoskeletal:       Negative for muscle weakness    PHYSICAL EXAM: Blood pressure 120/70, pulse 70, temperature 98 F (36.7 C), temperature source Oral, height 5\' 4"  (1.626 m), weight 220 lb (99.8 kg), last menstrual period 05/24/2010, SpO2 97 %. Body mass index is 37.76 kg/m. Physical Exam  Constitutional: She is oriented to person, place, and time. She appears well-developed and well-nourished.  Cardiovascular: Normal rate.  Pulmonary/Chest: Effort normal.  Musculoskeletal: Normal range of motion.  Neurological: She is oriented to person, place, and time.  Skin: Skin is warm and dry.  Psychiatric: She has a normal mood and affect. Her behavior  is normal.  Vitals reviewed.   RECENT LABS AND TESTS: BMET    Component Value Date/Time   NA 141 07/20/2017 1024   K 4.6 07/20/2017 1024   CL 103 07/20/2017 1024   CO2 25 07/20/2017 1024   GLUCOSE 82 07/20/2017 1024   GLUCOSE 109 (H) 11/19/2014 0520   BUN 11 07/20/2017 1024   CREATININE 0.82 07/20/2017 1024   CREATININE 0.89 12/12/2012 0813   CALCIUM 9.5 07/20/2017  1024   GFRNONAA 77 07/20/2017 1024   GFRAA 89 07/20/2017 1024   Lab Results  Component Value Date   HGBA1C 5.2 07/20/2017   HGBA1C 5.4 06/12/2014   HGBA1C 5.4 11/21/2013   HGBA1C 5.4 05/01/2013   HGBA1C 5.8 (H) 12/12/2012   Lab Results  Component Value Date   INSULIN 12.5 07/20/2017   CBC    Component Value Date/Time   WBC 5.3 07/20/2017 1024   WBC 11.6 (H) 11/19/2014 0520   RBC 4.84 07/20/2017 1024   RBC 3.88 11/19/2014 0520   HGB 12.8 07/20/2017 1024   HCT 41.4 07/20/2017 1024   PLT 201 06/20/2017 0907   MCV 86 07/20/2017 1024   MCH 26.4 (L) 07/20/2017 1024   MCH 28.9 11/19/2014 0520   MCHC 30.9 (L) 07/20/2017 1024   MCHC 32.7 11/19/2014 0520   RDW 13.8 07/20/2017 1024   LYMPHSABS 1.5 07/20/2017 1024   MONOABS 0.5 11/27/2013 1131   EOSABS 0.1 07/20/2017 1024   BASOSABS 0.0 07/20/2017 1024   Iron/TIBC/Ferritin/ %Sat    Component Value Date/Time   IRON 78 12/01/2016 0819   IRON 18 (L) 06/30/2016 0924   TIBC 315 12/01/2016 0819   TIBC 497 (H) 06/30/2016 0924   FERRITIN 20 07/14/2017 0828   IRONPCTSAT 25 12/01/2016 0819   Lipid Panel     Component Value Date/Time   CHOL 185 07/20/2017 1024   TRIG 113 07/20/2017 1024   HDL 43 07/20/2017 1024   CHOLHDL 4.8 (H) 06/20/2017 0907   CHOLHDL 4.2 11/21/2013 0712   VLDL 18 11/21/2013 0712   LDLCALC 119 (H) 07/20/2017 1024   Hepatic Function Panel     Component Value Date/Time   PROT 6.7 07/20/2017 1024   ALBUMIN 4.2 07/20/2017 1024   AST 18 07/20/2017 1024   ALT 15 07/20/2017 1024   ALKPHOS 128 (H) 07/20/2017 1024   BILITOT 0.5  07/20/2017 1024   BILIDIR 0.09 06/20/2017 0907   IBILI 0.3 12/12/2012 0813      Component Value Date/Time   TSH 2.030 07/14/2017 0828   TSH 2.300 01/10/2017 0826   TSH 2.320 08/12/2015 0926   Results for SAMARIA, ANES (MRN [id number]) as of 10/03/2017 09:55  Ref. Range 06/20/2017 09:07  Vitamin D, 25-Hydroxy Latest Ref Range: 30.0 - 100.0 ng/mL 27.7 (L)   ASSESSMENT AND PLAN: Vitamin D deficiency - Plan: Vitamin D, Ergocalciferol, (DRISDOL) 50000 units CAPS capsule  At risk for osteoporosis  Class 2 severe obesity with serious comorbidity and body mass index (BMI) of 37.0 to 37.9 in adult, unspecified obesity type ([place])  PLAN:  Vitamin D Deficiency Karen Vazquez was informed that low vitamin D levels contributes to fatigue and are associated with obesity, breast, and colon cancer. She agrees to continue to take prescription Vit D @50 ,000 IU every week #4 with no refills and will follow up for routine testing of vitamin D, at least 2-3 times per year. She was informed of the risk of over-replacement of vitamin D and agrees to not increase her dose unless she discusses this with [place] first. Karen Vazquez agrees to follow up as directed.  At risk for osteopenia and osteoporosis Karen Vazquez is at risk for osteopenia and osteoporosis due to her vitamin D deficiency. She was encouraged to take her vitamin D and follow her higher calcium diet and increase strengthening exercise to help strengthen her bones and decrease her risk of osteopenia and osteoporosis.  Obesity Karen Vazquez is currently in the action stage of change. As such, her goal  is to continue with weight loss efforts She has agreed to follow the Category 2 plan Karen Vazquez has been instructed to work up to a goal of 150 minutes of combined cardio and strengthening exercise per week for weight loss and overall health benefits. We discussed the following Behavioral Modification Strategies today: increasing lean protein intake, travel eating strategies,  and holiday  eating strategies   Karen Vazquez has agreed to follow up with our clinic in 2 weeks. She was informed of the importance of frequent follow up visits to maximize her success with intensive lifestyle modifications for her multiple health conditions.   OBESITY BEHAVIORAL INTERVENTION VISIT  Today's visit was # 5 out of 22.  Starting weight: 239 lbs Starting date: 07/20/17 Today's weight : 220 lbs Today's date: 10/03/2017 Total lbs lost to date: 26    ASK: We discussed the diagnosis of obesity with Karen Vazquez today and Karen Vazquez agreed to give [place] permission to discuss obesity behavioral modification therapy today.  ASSESS: Karen Vazquez has the diagnosis of obesity and her BMI today is 41.58 Karen Vazquez is in the action stage of change   ADVISE: Karen Vazquez was educated on the multiple health risks of obesity as well as the benefit of weight loss to improve her health. She was advised of the need for long term treatment and the importance of lifestyle modifications.  AGREE: Multiple dietary modification options and treatment options were discussed and  Karen Vazquez agreed to the above obesity treatment plan.  I, Doreene Nest, am acting as transcriptionist for Dennard Nip, MD  I have reviewed the above documentation for accuracy and completeness, and I agree with the above. -Dennard Nip, MD

## 2017-10-04 ENCOUNTER — Telehealth: Payer: Self-pay | Admitting: Family Medicine

## 2017-10-04 ENCOUNTER — Other Ambulatory Visit: Payer: Self-pay | Admitting: Family Medicine

## 2017-10-04 MED ORDER — SCOPOLAMINE 1 MG/3DAYS TD PT72: MEDICATED_PATCH | TRANSDERMAL | 2 refills | Status: DC

## 2017-10-04 NOTE — Telephone Encounter (Signed)
Medication sent in and pt aware. 

## 2017-10-04 NOTE — Telephone Encounter (Signed)
Patient is going on a cruise in the morning.  She is requesting Rx for nausea patches called in ASAP to pick up before she leaves to go out of town today.  [place]

## 2017-10-04 NOTE — Telephone Encounter (Signed)
Scopolamine patches, one box typically has 3 patches, apply the patch later today or in the morning, change a pad check in 3 days, may discontinue the patch after getting off the cruise, patch can cause dry mouth can at times cause blurry vision if any severe side effects stop wearing the patch, may have 2 refills

## 2017-10-04 NOTE — Telephone Encounter (Signed)
Please advise 

## 2017-10-16 ENCOUNTER — Encounter (INDEPENDENT_AMBULATORY_CARE_PROVIDER_SITE_OTHER): Payer: Self-pay | Admitting: Family Medicine

## 2017-10-16 NOTE — Telephone Encounter (Signed)
Can you take a look at this? 

## 2017-10-19 ENCOUNTER — Ambulatory Visit (INDEPENDENT_AMBULATORY_CARE_PROVIDER_SITE_OTHER): Payer: Federal, State, Local not specified - PPO | Admitting: Family Medicine

## 2017-10-19 VITALS — BP 145/79 | HR 57 | Temp 98.0°F | Ht 64.0 in | Wt 218.0 lb

## 2017-10-19 DIAGNOSIS — Z9189 Other specified personal risk factors, not elsewhere classified: Secondary | ICD-10-CM | POA: Diagnosis not present

## 2017-10-19 DIAGNOSIS — Z6837 Body mass index (BMI) 37.0-37.9, adult: Secondary | ICD-10-CM | POA: Diagnosis not present

## 2017-10-19 DIAGNOSIS — E559 Vitamin D deficiency, unspecified: Secondary | ICD-10-CM | POA: Diagnosis not present

## 2017-10-19 MED ORDER — VITAMIN D (ERGOCALCIFEROL) 1.25 MG (50000 UNIT) PO CAPS: 50000.0000 [IU] | ORAL_CAPSULE | ORAL | 0 refills | Status: DC

## 2017-10-19 NOTE — Progress Notes (Signed)
Office: [phone]  /  Fax: [phone]   HPI:   Chief Complaint: OBESITY Karen Vazquez is here to discuss her progress with her obesity treatment plan. She is on the Category 2 plan and is following her eating plan approximately 80 % of the time. She states she is walking for 30-40 minutes 3 times per week. Karen Vazquez continues to do well with weight loss even while on the cruise. She is going on another cruise soon and plans to increase her exercise this time.  Her weight is 218 lb (98.9 kg) today and has had a weight loss of 2 pounds over a period of 2 weeks since her last visit. She has lost 21 lbs since starting treatment with [place].  Vitamin D Deficiency Karen Vazquez has a diagnosis of vitamin D deficiency. She is stable on prescription Vit D, not yet at goal. She denies nausea, vomiting or muscle weakness.  At risk for osteopenia and osteoporosis Karen Vazquez is at higher risk of osteopenia and osteoporosis due to vitamin D deficiency.   ALLERGIES: Allergies  Allergen Reactions  . Lisinopril Cough    MEDICATIONS: Current Outpatient Medications on File Prior to Visit  Medication Sig Dispense Refill  . albuterol (PROVENTIL HFA;VENTOLIN HFA) 108 (90 Base) MCG/ACT inhaler Inhale 2 puffs into the lungs every 6 (six) hours as needed for wheezing or shortness of breath. 1 Inhaler 2  . ALPRAZolam (XANAX) 0.5 MG tablet TAKE ONE TABLET BY MOUTH TWO TIMES DAILYAS NEEDED FOR ANXIETY OR SLEEP. 30 tablet 5  . amLODipine (NORVASC) 2.5 MG tablet Take 1 tablet (2.5 mg total) by mouth daily. 30 tablet 5  . ARMOUR THYROID 15 MG tablet TAKE ONE (1) TABLET EACH DAY 30 tablet 5  . ARMOUR THYROID 30 MG tablet TAKE ONE (1) TABLET EACH DAY 30 tablet 5  . benzonatate (TESSALON) 100 MG capsule Take 1 capsule (100 mg total) by mouth 2 (two) times daily as needed for cough. 20 capsule 0  . meloxicam (MOBIC) 15 MG tablet Take 1 tablet (15 mg total) by mouth daily. 30 tablet 0  . oxybutynin (DITROPAN XL) 5 MG 24 hr tablet Take 1  tablet (5 mg total) by mouth daily as needed. 30 tablet 5  . Pediatric Multiple Vit-C-FA (FLINSTONES GUMMIES OMEGA-3 DHA PO) Take 1 tablet by mouth daily.    Marland Kitchen rOPINIRole (REQUIP) 4 MG tablet TAKE 1 TABLET (4 MG TOTAL) BY MOUTH EVERY EVENING 30 tablet 12  . scopolamine (TRANSDERM-SCOP, 1.5 MG,) 1 MG/3DAYS Place one behind ear every 3 days 3 patch 2  . Vitamin D, Ergocalciferol, (DRISDOL) 50000 units CAPS capsule Take 1 capsule (50,000 Units total) by mouth every 7 (seven) days. 4 capsule 0  . zolpidem (AMBIEN) 10 MG tablet TAKE 1 TABLET (10 MG TOTAL) BY MOUTH AT BEDTIME AS NEEDED FOR SLEEP 90 tablet 1   No current facility-administered medications on file prior to visit.     PAST MEDICAL HISTORY: Past Medical History:  Diagnosis Date  . Anemia   . Anxiety   . Arthritis   . Back pain   . Blood transfusion 2011   at [facility]  . Bulging lumbar disc   . Chest pain   . Depression   . Dry mouth   . Easy bruising   . Excessive thirst   . Fatigue   . Frequent urination   . GERD (gastroesophageal reflux disease)   . Headache   . Heat intolerance   . Hypertension   . Hypothyroidism   .  Joint pain   . Leg pain   . Muscle stiffness   . Nervousness   . Palpitations   . Pre-diabetes   . Restless leg syndrome   . Shortness of breath on exertion   . Sleep apnea    does not use c-pap machine  . Stomach ulcer   . Stress   . Swelling of both lower extremities   . Trouble in sleeping   . Vitamin D deficiency   . Weakness     PAST SURGICAL HISTORY: Past Surgical History:  Procedure Laterality Date  . APPENDECTOMY    . BLADDER SUSPENSION  12/29/2010   Procedure: TRANSVAGINAL TAPE (TVT) PROCEDURE;  Surgeon: Daria Pastures;  Location: [facility];  Service: Gynecology;  Laterality: N/A;  . BREAST SURGERY     left breast lumpectomy 1977  . COLONOSCOPY N/A 03/03/2016   Procedure: COLONOSCOPY;  Surgeon: Rogene Houston, MD;  [facility];  Service: Endoscopy;  Laterality: N/A;   2:00 - moved to 12/14 @ 12:55 - Ann notified pt  . CYSTOCELE REPAIR  12/29/2010   Procedure: ANTERIOR REPAIR (CYSTOCELE);  Surgeon: Daria Pastures;  Location: [facility];  Service: Gynecology;  Laterality: N/A;  . CYSTOSCOPY  12/29/2010   Procedure: CYSTOSCOPY;  Surgeon: Daria Pastures;  Location: [facility];  Service: Gynecology;  Laterality: N/A;  . JOINT REPLACEMENT  2011   rt knee  . REPLACEMENT TOTAL KNEE Right 2011  . TONSILLECTOMY    . TOTAL KNEE ARTHROPLASTY Left 11/17/2014   Procedure: LEFT TOTAL KNEE ARTHROPLASTY;  Surgeon: Gaynelle Arabian, MD;  Location: [facility];  Service: Orthopedics;  Laterality: Left;  . TUBAL LIGATION    . VAGINAL HYSTERECTOMY  12/29/2010   Procedure: HYSTERECTOMY VAGINAL;  Surgeon: Daria Pastures;  Location: [facility];  Service: Gynecology;  Laterality: N/A;    SOCIAL HISTORY: Social History   Tobacco Use  . Smoking status: Never Smoker  . Smokeless tobacco: Never Used  Substance Use Topics  . Alcohol use: No  . Drug use: No    FAMILY HISTORY: Family History  Problem Relation Age of Onset  . Stroke Mother   . Hypertension Mother   . Hyperlipidemia Mother   . Thyroid disease Mother   . Heart disease Father   . COPD Father   . Cancer Father   . Depression Father   . Cancer Paternal Grandmother        breast  . Diabetes Paternal Grandfather     ROS: Review of Systems  Constitutional: Positive for weight loss.  Gastrointestinal: Negative for nausea and vomiting.  Musculoskeletal:       Negative muscle weakness    PHYSICAL EXAM: Blood pressure (!) 145/79, pulse (!) 57, temperature 98 F (36.7 C), temperature source Oral, height 5\' 4"  (1.626 m), weight 218 lb (98.9 kg), last menstrual period 05/24/2010, SpO2 98 %. Body mass index is 37.42 kg/m. Physical Exam  Constitutional: She is oriented to person, place, and time. She appears well-developed and well-nourished.  Cardiovascular: Normal rate.  Pulmonary/Chest: Effort normal.    Musculoskeletal: Normal range of motion.  Neurological: She is oriented to person, place, and time.  Skin: Skin is warm and dry.  Psychiatric: She has a normal mood and affect. Her behavior is normal.  Vitals reviewed.   RECENT LABS AND TESTS: BMET    Component Value Date/Time   NA 141 07/20/2017 1024   K 4.6 07/20/2017 1024   CL 103 07/20/2017 1024   CO2  25 07/20/2017 1024   GLUCOSE 82 07/20/2017 1024   GLUCOSE 109 (H) 11/19/2014 0520   BUN 11 07/20/2017 1024   CREATININE 0.82 07/20/2017 1024   CREATININE 0.89 12/12/2012 0813   CALCIUM 9.5 07/20/2017 1024   GFRNONAA 77 07/20/2017 1024   GFRAA 89 07/20/2017 1024   Lab Results  Component Value Date   HGBA1C 5.2 07/20/2017   HGBA1C 5.4 06/12/2014   HGBA1C 5.4 11/21/2013   HGBA1C 5.4 05/01/2013   HGBA1C 5.8 (H) 12/12/2012   Lab Results  Component Value Date   INSULIN 12.5 07/20/2017   CBC    Component Value Date/Time   WBC 5.3 07/20/2017 1024   WBC 11.6 (H) 11/19/2014 0520   RBC 4.84 07/20/2017 1024   RBC 3.88 11/19/2014 0520   HGB 12.8 07/20/2017 1024   HCT 41.4 07/20/2017 1024   PLT 201 06/20/2017 0907   MCV 86 07/20/2017 1024   MCH 26.4 (L) 07/20/2017 1024   MCH 28.9 11/19/2014 0520   MCHC 30.9 (L) 07/20/2017 1024   MCHC 32.7 11/19/2014 0520   RDW 13.8 07/20/2017 1024   LYMPHSABS 1.5 07/20/2017 1024   MONOABS 0.5 11/27/2013 1131   EOSABS 0.1 07/20/2017 1024   BASOSABS 0.0 07/20/2017 1024   Iron/TIBC/Ferritin/ %Sat    Component Value Date/Time   IRON 78 12/01/2016 0819   IRON 18 (L) 06/30/2016 0924   TIBC 315 12/01/2016 0819   TIBC 497 (H) 06/30/2016 0924   FERRITIN 20 07/14/2017 0828   IRONPCTSAT 25 12/01/2016 0819   Lipid Panel     Component Value Date/Time   CHOL 185 07/20/2017 1024   TRIG 113 07/20/2017 1024   HDL 43 07/20/2017 1024   CHOLHDL 4.8 (H) 06/20/2017 0907   CHOLHDL 4.2 11/21/2013 0712   VLDL 18 11/21/2013 0712   LDLCALC 119 (H) 07/20/2017 1024   Hepatic Function Panel      Component Value Date/Time   PROT 6.7 07/20/2017 1024   ALBUMIN 4.2 07/20/2017 1024   AST 18 07/20/2017 1024   ALT 15 07/20/2017 1024   ALKPHOS 128 (H) 07/20/2017 1024   BILITOT 0.5 07/20/2017 1024   BILIDIR 0.09 06/20/2017 0907   IBILI 0.3 12/12/2012 0813      Component Value Date/Time   TSH 2.030 07/14/2017 0828   TSH 2.300 01/10/2017 0826   TSH 2.320 08/12/2015 0926  Results for DRINA, JOBST (MRN [id number]) as of 10/19/2017 15:08  Ref. Range 06/20/2017 09:07  Vitamin D, 25-Hydroxy Latest Ref Range: 30.0 - 100.0 ng/mL 27.7 (L)    ASSESSMENT AND PLAN: Vitamin D deficiency - Plan: Vitamin D, Ergocalciferol, (DRISDOL) 50000 units CAPS capsule  At risk for osteoporosis  Class 2 severe obesity with serious comorbidity and body mass index (BMI) of 37.0 to 37.9 in adult, unspecified obesity type ([place])  PLAN:  Vitamin D Deficiency Karen Vazquez was informed that low vitamin D levels contributes to fatigue and are associated with obesity, breast, and colon cancer. Karen Vazquez agrees to continue taking prescription Vit D @50 ,000 IU every week #4 and we will refill for 1 month. She will follow up for routine testing of vitamin D, at least 2-3 times per year. She was informed of the risk of over-replacement of vitamin D and agrees to not increase her dose unless she discusses this with [place] first. Earlean agrees to follow up with our clinic in 2 to 3 weeks.  At risk for osteopenia and osteoporosis Karen Vazquez is at risk for osteopenia and osteoporsis due to her vitamin  D deficiency. She was encouraged to take her vitamin D and follow her higher calcium diet and increase strengthening exercise to help strengthen her bones and decrease her risk of osteopenia and osteoporosis.  Obesity Karen Vazquez is currently in the action stage of change. As such, her goal is to continue with weight loss efforts She has agreed to follow the Category 2 plan Karen Vazquez has been instructed to work up to a goal of 150 minutes of combined  cardio and strengthening exercise per week for weight loss and overall health benefits. We discussed the following Behavioral Modification Strategies today: holiday eating strategies, travel eating strategies, and decrease liquid calories   Karen Vazquez has agreed to follow up with our clinic in 2 to 3 weeks. She was informed of the importance of frequent follow up visits to maximize her success with intensive lifestyle modifications for her multiple health conditions.   OBESITY BEHAVIORAL INTERVENTION VISIT  Today's visit was # 6 out of 22.  Starting weight: 239 lbs Starting date: 07/20/17 Today's weight : 218 lbs  Today's date: 10/19/2017 Total lbs lost to date: 21    ASK: We discussed the diagnosis of obesity with Karen Vazquez today and Karen Vazquez agreed to give [place] permission to discuss obesity behavioral modification therapy today.  ASSESS: Karen Vazquez has the diagnosis of obesity and her BMI today is 32.5 Karen Vazquez is in the action stage of change   ADVISE: Karen Vazquez was educated on the multiple health risks of obesity as well as the benefit of weight loss to improve her health. She was advised of the need for long term treatment and the importance of lifestyle modifications.  AGREE: Multiple dietary modification options and treatment options were discussed and  Karen Vazquez agreed to the above obesity treatment plan.  I, Trixie Dredge, am acting as transcriptionist for Dennard Nip, MD  I have reviewed the above documentation for accuracy and completeness, and I agree with the above. -Dennard Nip, MD

## 2017-11-03 DIAGNOSIS — M25551 Pain in right hip: Secondary | ICD-10-CM | POA: Diagnosis not present

## 2017-11-03 DIAGNOSIS — M2142 Flat foot [pes planus] (acquired), left foot: Secondary | ICD-10-CM | POA: Diagnosis not present

## 2017-11-03 DIAGNOSIS — M2141 Flat foot [pes planus] (acquired), right foot: Secondary | ICD-10-CM | POA: Diagnosis not present

## 2017-11-09 ENCOUNTER — Ambulatory Visit (INDEPENDENT_AMBULATORY_CARE_PROVIDER_SITE_OTHER): Payer: Federal, State, Local not specified - PPO | Admitting: Family Medicine

## 2017-11-09 VITALS — BP 129/75 | HR 63 | Temp 97.9°F

## 2017-11-09 DIAGNOSIS — Z6837 Body mass index (BMI) 37.0-37.9, adult: Secondary | ICD-10-CM

## 2017-11-09 DIAGNOSIS — E559 Vitamin D deficiency, unspecified: Secondary | ICD-10-CM

## 2017-11-09 DIAGNOSIS — Z9189 Other specified personal risk factors, not elsewhere classified: Secondary | ICD-10-CM

## 2017-11-09 MED ORDER — VITAMIN D (ERGOCALCIFEROL) 1.25 MG (50000 UNIT) PO CAPS: 50000.0000 [IU] | ORAL_CAPSULE | ORAL | 0 refills | Status: DC

## 2017-11-09 NOTE — Progress Notes (Signed)
Office: [phone]  /  Fax: [phone]   HPI:   Chief Complaint: OBESITY Karen Vazquez is here to discuss her progress with her obesity treatment plan. She is on the Category 2 plan and is following her eating plan approximately 60 % of the time. She states she did a lot of walking on her cruise 5 times per week. Karen Vazquez has been on vacation on a cruise, but she was still mindful about her food choices. She has increased walking and exercise. Her weight is   today and has had a weight loss of 2 pounds over a period of 3 weeks since her last visit. She has lost 23 lbs since starting treatment with [place].  Vitamin D deficiency Karen Vazquez has a diagnosis of vitamin D deficiency. Karen Vazquez is stable on vit D, but she is not yet at goal. Karen Vazquez denies nausea, vomiting or muscle weakness.  At risk for osteopenia and osteoporosis Karen Vazquez is at higher risk of osteopenia and osteoporosis due to vitamin D deficiency.   ALLERGIES: Allergies  Allergen Reactions  . Lisinopril Cough    MEDICATIONS: Current Outpatient Medications on File Prior to Visit  Medication Sig Dispense Refill  . albuterol (PROVENTIL HFA;VENTOLIN HFA) 108 (90 Base) MCG/ACT inhaler Inhale 2 puffs into the lungs every 6 (six) hours as needed for wheezing or shortness of breath. 1 Inhaler 2  . ALPRAZolam (XANAX) 0.5 MG tablet TAKE ONE TABLET BY MOUTH TWO TIMES DAILYAS NEEDED FOR ANXIETY OR SLEEP. 30 tablet 5  . amLODipine (NORVASC) 2.5 MG tablet Take 1 tablet (2.5 mg total) by mouth daily. 30 tablet 5  . ARMOUR THYROID 15 MG tablet TAKE ONE (1) TABLET EACH DAY 30 tablet 5  . ARMOUR THYROID 30 MG tablet TAKE ONE (1) TABLET EACH DAY 30 tablet 5  . benzonatate (TESSALON) 100 MG capsule Take 1 capsule (100 mg total) by mouth 2 (two) times daily as needed for cough. 20 capsule 0  . meloxicam (MOBIC) 15 MG tablet Take 1 tablet (15 mg total) by mouth daily. 30 tablet 0  . oxybutynin (DITROPAN XL) 5 MG 24 hr tablet Take 1 tablet (5 mg total) by mouth  daily as needed. 30 tablet 5  . Pediatric Multiple Vit-C-FA (FLINSTONES GUMMIES OMEGA-3 DHA PO) Take 1 tablet by mouth daily.    Marland Kitchen rOPINIRole (REQUIP) 4 MG tablet TAKE 1 TABLET (4 MG TOTAL) BY MOUTH EVERY EVENING 30 tablet 12  . scopolamine (TRANSDERM-SCOP, 1.5 MG,) 1 MG/3DAYS Place one behind ear every 3 days 3 patch 2  . zolpidem (AMBIEN) 10 MG tablet TAKE 1 TABLET (10 MG TOTAL) BY MOUTH AT BEDTIME AS NEEDED FOR SLEEP 90 tablet 1   No current facility-administered medications on file prior to visit.     PAST MEDICAL HISTORY: Past Medical History:  Diagnosis Date  . Anemia   . Anxiety   . Arthritis   . Back pain   . Blood transfusion 2011   at [facility]  . Bulging lumbar disc   . Chest pain   . Depression   . Dry mouth   . Easy bruising   . Excessive thirst   . Fatigue   . Frequent urination   . GERD (gastroesophageal reflux disease)   . Headache   . Heat intolerance   . Hypertension   . Hypothyroidism   . Joint pain   . Leg pain   . Muscle stiffness   . Nervousness   . Palpitations   . Pre-diabetes   . Restless  leg syndrome   . Shortness of breath on exertion   . Sleep apnea    does not use c-pap machine  . Stomach ulcer   . Stress   . Swelling of both lower extremities   . Trouble in sleeping   . Vitamin D deficiency   . Weakness     PAST SURGICAL HISTORY: Past Surgical History:  Procedure Laterality Date  . APPENDECTOMY    . BLADDER SUSPENSION  12/29/2010   Procedure: TRANSVAGINAL TAPE (TVT) PROCEDURE;  Surgeon: Daria Pastures;  Location: [facility];  Service: Gynecology;  Laterality: N/A;  . BREAST SURGERY     left breast lumpectomy 1977  . COLONOSCOPY N/A 03/03/2016   Procedure: COLONOSCOPY;  Surgeon: Rogene Houston, MD;  [facility];  Service: Endoscopy;  Laterality: N/A;  2:00 - moved to 12/14 @ 12:55 - Ann notified pt  . CYSTOCELE REPAIR  12/29/2010   Procedure: ANTERIOR REPAIR (CYSTOCELE);  Surgeon: Daria Pastures;  Location: [facility];   Service: Gynecology;  Laterality: N/A;  . CYSTOSCOPY  12/29/2010   Procedure: CYSTOSCOPY;  Surgeon: Daria Pastures;  Location: [facility];  Service: Gynecology;  Laterality: N/A;  . JOINT REPLACEMENT  2011   rt knee  . REPLACEMENT TOTAL KNEE Right 2011  . TONSILLECTOMY    . TOTAL KNEE ARTHROPLASTY Left 11/17/2014   Procedure: LEFT TOTAL KNEE ARTHROPLASTY;  Surgeon: Gaynelle Arabian, MD;  Location: [facility];  Service: Orthopedics;  Laterality: Left;  . TUBAL LIGATION    . VAGINAL HYSTERECTOMY  12/29/2010   Procedure: HYSTERECTOMY VAGINAL;  Surgeon: Daria Pastures;  Location: [facility];  Service: Gynecology;  Laterality: N/A;    SOCIAL HISTORY: Social History   Tobacco Use  . Smoking status: Never Smoker  . Smokeless tobacco: Never Used  Substance Use Topics  . Alcohol use: No  . Drug use: No    FAMILY HISTORY: Family History  Problem Relation Age of Onset  . Stroke Mother   . Hypertension Mother   . Hyperlipidemia Mother   . Thyroid disease Mother   . Heart disease Father   . COPD Father   . Cancer Father   . Depression Father   . Cancer Paternal Grandmother        breast  . Diabetes Paternal Grandfather     ROS: Review of Systems  Constitutional: Positive for weight loss.  Gastrointestinal: Negative for nausea and vomiting.  Musculoskeletal:       Negative for muscle weakness    PHYSICAL EXAM: Blood pressure 129/75, pulse 63, temperature 97.9 F (36.6 C), temperature source Oral, last menstrual period 05/24/2010, SpO2 97 %. There is no height or weight on file to calculate BMI. Physical Exam  Constitutional: She is oriented to person, place, and time. She appears well-developed and well-nourished.  Cardiovascular: Normal rate.  Pulmonary/Chest: Effort normal.  Musculoskeletal: Normal range of motion.  Neurological: She is oriented to person, place, and time.  Skin: Skin is warm and dry.  Psychiatric: She has a normal mood and affect. Her behavior is normal.    Vitals reviewed.   RECENT LABS AND TESTS: BMET    Component Value Date/Time   NA 141 07/20/2017 1024   K 4.6 07/20/2017 1024   CL 103 07/20/2017 1024   CO2 25 07/20/2017 1024   GLUCOSE 82 07/20/2017 1024   GLUCOSE 109 (H) 11/19/2014 0520   BUN 11 07/20/2017 1024   CREATININE 0.82 07/20/2017 1024   CREATININE 0.89 12/12/2012 0813  CALCIUM 9.5 07/20/2017 1024   GFRNONAA 77 07/20/2017 1024   GFRAA 89 07/20/2017 1024   Lab Results  Component Value Date   HGBA1C 5.2 07/20/2017   HGBA1C 5.4 06/12/2014   HGBA1C 5.4 11/21/2013   HGBA1C 5.4 05/01/2013   HGBA1C 5.8 (H) 12/12/2012   Lab Results  Component Value Date   INSULIN 12.5 07/20/2017   CBC    Component Value Date/Time   WBC 5.3 07/20/2017 1024   WBC 11.6 (H) 11/19/2014 0520   RBC 4.84 07/20/2017 1024   RBC 3.88 11/19/2014 0520   HGB 12.8 07/20/2017 1024   HCT 41.4 07/20/2017 1024   PLT 201 06/20/2017 0907   MCV 86 07/20/2017 1024   MCH 26.4 (L) 07/20/2017 1024   MCH 28.9 11/19/2014 0520   MCHC 30.9 (L) 07/20/2017 1024   MCHC 32.7 11/19/2014 0520   RDW 13.8 07/20/2017 1024   LYMPHSABS 1.5 07/20/2017 1024   MONOABS 0.5 11/27/2013 1131   EOSABS 0.1 07/20/2017 1024   BASOSABS 0.0 07/20/2017 1024   Iron/TIBC/Ferritin/ %Sat    Component Value Date/Time   IRON 78 12/01/2016 0819   IRON 18 (L) 06/30/2016 0924   TIBC 315 12/01/2016 0819   TIBC 497 (H) 06/30/2016 0924   FERRITIN 20 07/14/2017 0828   IRONPCTSAT 25 12/01/2016 0819   Lipid Panel     Component Value Date/Time   CHOL 185 07/20/2017 1024   TRIG 113 07/20/2017 1024   HDL 43 07/20/2017 1024   CHOLHDL 4.8 (H) 06/20/2017 0907   CHOLHDL 4.2 11/21/2013 0712   VLDL 18 11/21/2013 0712   LDLCALC 119 (H) 07/20/2017 1024   Hepatic Function Panel     Component Value Date/Time   PROT 6.7 07/20/2017 1024   ALBUMIN 4.2 07/20/2017 1024   AST 18 07/20/2017 1024   ALT 15 07/20/2017 1024   ALKPHOS 128 (H) 07/20/2017 1024   BILITOT 0.5 07/20/2017 1024    BILIDIR 0.09 06/20/2017 0907   IBILI 0.3 12/12/2012 0813      Component Value Date/Time   TSH 2.030 07/14/2017 0828   TSH 2.300 01/10/2017 0826   TSH 2.320 08/12/2015 0926   Results for ELISAMA, THISSEN (MRN [id number]) as of 11/09/2017 10:52  Ref. Range 06/20/2017 09:07  Vitamin D, 25-Hydroxy Latest Ref Range: 30.0 - 100.0 ng/mL 27.7 (L)   ASSESSMENT AND PLAN: Vitamin D deficiency - Plan: Vitamin D, Ergocalciferol, (DRISDOL) 50000 units CAPS capsule  At risk for osteoporosis  Class 2 severe obesity with serious comorbidity and body mass index (BMI) of 37.0 to 37.9 in adult, unspecified obesity type ([place])  PLAN:  Vitamin D Deficiency Lei was informed that low vitamin D levels contributes to fatigue and are associated with obesity, breast, and colon cancer. She agrees to continue to take prescription Vit D @50 ,000 IU every week #4 with no refills and will follow up for routine testing of vitamin D, at least 2-3 times per year. She was informed of the risk of over-replacement of vitamin D and agrees to not increase her dose unless she discusses this with [place] first. We will recheck labs in 2 to 3 weeks and Mykenna agrees to follow up at the agreed upon time.  At risk for osteopenia and osteoporosis Louanne is at risk for osteopenia and osteoporosis due to her vitamin D deficiency. She was encouraged to take her vitamin D and follow her higher calcium diet and increase strengthening exercise to help strengthen her bones and decrease her risk of osteopenia and  osteoporosis.  Obesity Mianna is currently in the action stage of change. As such, her goal is to continue with weight loss efforts She has agreed to follow the Category 2 plan Cletis has been instructed to work up to a goal of 150 minutes of combined cardio and strengthening exercise per week or exercise bands for weight loss and overall health benefits. We discussed the following Behavioral Modification Strategies today: increasing lean  protein intake and decreasing simple carbohydrates   Alayia has agreed to follow up with our clinic in 2 to 3 weeks fasting. She was informed of the importance of frequent follow up visits to maximize her success with intensive lifestyle modifications for her multiple health conditions.   OBESITY BEHAVIORAL INTERVENTION VISIT  Today's visit was # 7   Starting weight: 239 lbs Starting date: 07/20/17 Today's weight : 216 lbs Today's date: 11/09/2017 Total lbs lost to date: 23 At least 15 minutes were spent on discussing the following behavioral intervention visit.   ASK: We discussed the diagnosis of obesity with [name] today and Katalia agreed to give [place] permission to discuss obesity behavioral modification therapy today.  ASSESS: Juli has the diagnosis of obesity and her BMI today is 71.2 Chrys is in the action stage of change   ADVISE: Flannery was educated on the multiple health risks of obesity as well as the benefit of weight loss to improve her health. She was advised of the need for long term treatment and the importance of lifestyle modifications to improve her current health and to decrease her risk of future health problems.  AGREE: Multiple dietary modification options and treatment options were discussed and  Jolana agreed to follow the recommendations documented in the above note.  ARRANGE: Raenell was educated on the importance of frequent visits to treat obesity as outlined per CMS and USPSTF guidelines and agreed to schedule her next follow up appointment today.  I, Doreene Nest, am acting as transcriptionist for Dennard Nip, MD  I have reviewed the above documentation for accuracy and completeness, and I agree with the above. -Dennard Nip, MD

## 2017-11-28 ENCOUNTER — Other Ambulatory Visit (INDEPENDENT_AMBULATORY_CARE_PROVIDER_SITE_OTHER): Payer: Self-pay | Admitting: Bariatrics

## 2017-11-28 ENCOUNTER — Ambulatory Visit (INDEPENDENT_AMBULATORY_CARE_PROVIDER_SITE_OTHER): Payer: Federal, State, Local not specified - PPO | Admitting: Bariatrics

## 2017-11-28 ENCOUNTER — Encounter (INDEPENDENT_AMBULATORY_CARE_PROVIDER_SITE_OTHER): Payer: Self-pay | Admitting: Bariatrics

## 2017-11-28 VITALS — BP 134/77 | HR 59 | Temp 97.3°F | Ht 64.0 in | Wt 218.0 lb

## 2017-11-28 DIAGNOSIS — Z6837 Body mass index (BMI) 37.0-37.9, adult: Secondary | ICD-10-CM | POA: Diagnosis not present

## 2017-11-28 DIAGNOSIS — I10 Essential (primary) hypertension: Secondary | ICD-10-CM | POA: Diagnosis not present

## 2017-11-28 DIAGNOSIS — Z862 Personal history of diseases of the blood and blood-forming organs and certain disorders involving the immune mechanism: Secondary | ICD-10-CM | POA: Diagnosis not present

## 2017-11-28 DIAGNOSIS — Z9189 Other specified personal risk factors, not elsewhere classified: Secondary | ICD-10-CM | POA: Diagnosis not present

## 2017-11-28 DIAGNOSIS — E559 Vitamin D deficiency, unspecified: Secondary | ICD-10-CM

## 2017-11-29 LAB — CBC WITH DIFFERENTIAL
Basophils Absolute: 0 10*3/uL (ref 0.0–0.2)
Basos: 0 %
EOS (ABSOLUTE): 0.1 10*3/uL (ref 0.0–0.4)
EOS: 2 %
HEMATOCRIT: 43.5 % (ref 34.0–46.6)
HEMOGLOBIN: 14 g/dL (ref 11.1–15.9)
IMMATURE GRANULOCYTES: 0 %
Immature Grans (Abs): 0 10*3/uL (ref 0.0–0.1)
LYMPHS ABS: 1.6 10*3/uL (ref 0.7–3.1)
Lymphs: 29 %
MCH: 28.4 pg (ref 26.6–33.0)
MCHC: 32.2 g/dL (ref 31.5–35.7)
MCV: 88 fL (ref 79–97)
MONOCYTES: 7 %
Monocytes Absolute: 0.4 10*3/uL (ref 0.1–0.9)
Neutrophils Absolute: 3.5 10*3/uL (ref 1.4–7.0)
Neutrophils: 62 %
RBC: 4.93 x10E6/uL (ref 3.77–5.28)
RDW: 14.8 % (ref 12.3–15.4)
WBC: 5.5 10*3/uL (ref 3.4–10.8)

## 2017-11-29 LAB — HEMOGLOBIN A1C
Est. average glucose Bld gHb Est-mCnc: 108 mg/dL
HEMOGLOBIN A1C: 5.4 % (ref 4.8–5.6)

## 2017-11-29 LAB — COMPREHENSIVE METABOLIC PANEL
ALBUMIN: 4.2 g/dL (ref 3.6–4.8)
ALK PHOS: 105 IU/L (ref 39–117)
ALT: 17 IU/L (ref 0–32)
AST: 17 IU/L (ref 0–40)
Albumin/Globulin Ratio: 1.8 (ref 1.2–2.2)
BUN / CREAT RATIO: 19 (ref 12–28)
BUN: 16 mg/dL (ref 8–27)
Bilirubin Total: 0.5 mg/dL (ref 0.0–1.2)
CO2: 27 mmol/L (ref 20–29)
CREATININE: 0.83 mg/dL (ref 0.57–1.00)
Calcium: 9.6 mg/dL (ref 8.7–10.3)
Chloride: 103 mmol/L (ref 96–106)
GFR, EST AFRICAN AMERICAN: 87 mL/min/{1.73_m2} (ref >59)
GFR, EST NON AFRICAN AMERICAN: 76 mL/min/{1.73_m2} (ref >59)
GLOBULIN, TOTAL: 2.3 g/dL (ref 1.5–4.5)
Glucose: 75 mg/dL (ref 65–99)
Potassium: 4.6 mmol/L (ref 3.5–5.2)
SODIUM: 145 mmol/L — AB (ref 134–144)
Total Protein: 6.5 g/dL (ref 6.0–8.5)

## 2017-11-29 LAB — TSH: TSH: 1.05 u[IU]/mL (ref 0.450–4.500)

## 2017-11-29 LAB — T4, FREE: Free T4: 1.33 ng/dL (ref 0.82–1.77)

## 2017-11-29 LAB — VITAMIN D 25 HYDROXY (VIT D DEFICIENCY, FRACTURES): VIT D 25 HYDROXY: 43.4 ng/mL (ref 30.0–100.0)

## 2017-11-29 LAB — INSULIN, RANDOM: INSULIN: 7.8 u[IU]/mL (ref 2.6–24.9)

## 2017-11-29 LAB — T3: T3 TOTAL: 98 ng/dL (ref 71–180)

## 2017-11-30 NOTE — Progress Notes (Signed)
Office: [phone]  /  Fax: [phone]   HPI:   Chief Complaint: OBESITY Karen Vazquez is here to discuss her progress with her obesity treatment plan. She is on the Category 2 plan and is following her eating plan approximately 50 % of the time. She states she is exercising 0 minutes 0 times per week. Karen Vazquez states her "mind is not focusing on diet". She denies increase in hunger or carbohydrate cravings using Category 2, but still workable.  Her weight is 218 lb (98.9 kg) today and has gained 2 pounds since her last visit. She has lost 21 lbs since starting treatment with [place].  Vitamin D Deficiency Karen Vazquez has a diagnosis of vitamin D deficiency. She is currently taking prescription Vit D and denies nausea, vomiting or muscle weakness.  At risk for osteopenia and osteoporosis Karen Vazquez is at higher risk of osteopenia and osteoporosis due to vitamin D deficiency.   Hypertension Karen Vazquez is a 62 y.o. female with hypertension. Karen Vazquez's blood pressure is well controlled and she denies chest pain. She is taking anti-hypertensive medications. She is working weight loss to help control her blood pressure with the goal of decreasing her risk of heart attack and stroke.   History of Anemia Karen Vazquez has a history of anemia, possible due to giving blood. She had iron infusions 3 times last year. She is on iron supplementation.    ALLERGIES: Allergies  Allergen Reactions  . Lisinopril Cough    MEDICATIONS: Current Outpatient Medications on File Prior to Visit  Medication Sig Dispense Refill  . albuterol (PROVENTIL HFA;VENTOLIN HFA) 108 (90 Base) MCG/ACT inhaler Inhale 2 puffs into the lungs every 6 (six) hours as needed for wheezing or shortness of breath. 1 Inhaler 2  . ALPRAZolam (XANAX) 0.5 MG tablet TAKE ONE TABLET BY MOUTH TWO TIMES DAILYAS NEEDED FOR ANXIETY OR SLEEP. 30 tablet 5  . amLODipine (NORVASC) 2.5 MG tablet Take 1 tablet (2.5 mg total) by mouth daily. 30 tablet 5  . ARMOUR THYROID  15 MG tablet TAKE ONE (1) TABLET EACH DAY 30 tablet 5  . ARMOUR THYROID 30 MG tablet TAKE ONE (1) TABLET EACH DAY 30 tablet 5  . benzonatate (TESSALON) 100 MG capsule Take 1 capsule (100 mg total) by mouth 2 (two) times daily as needed for cough. 20 capsule 0  . meloxicam (MOBIC) 15 MG tablet Take 1 tablet (15 mg total) by mouth daily. 30 tablet 0  . oxybutynin (DITROPAN XL) 5 MG 24 hr tablet Take 1 tablet (5 mg total) by mouth daily as needed. 30 tablet 5  . Pediatric Multiple Vit-C-FA (FLINSTONES GUMMIES OMEGA-3 DHA PO) Take 1 tablet by mouth daily.    Marland Kitchen rOPINIRole (REQUIP) 4 MG tablet TAKE 1 TABLET (4 MG TOTAL) BY MOUTH EVERY EVENING 30 tablet 12  . scopolamine (TRANSDERM-SCOP, 1.5 MG,) 1 MG/3DAYS Place one behind ear every 3 days 3 patch 2  . Vitamin D, Ergocalciferol, (DRISDOL) 50000 units CAPS capsule Take 1 capsule (50,000 Units total) by mouth every 7 (seven) days. 4 capsule 0  . zolpidem (AMBIEN) 10 MG tablet TAKE 1 TABLET (10 MG TOTAL) BY MOUTH AT BEDTIME AS NEEDED FOR SLEEP 90 tablet 1   No current facility-administered medications on file prior to visit.     PAST MEDICAL HISTORY: Past Medical History:  Diagnosis Date  . Anemia   . Anxiety   . Arthritis   . Back pain   . Blood transfusion 2011   at [facility]  . Bulging  lumbar disc   . Chest pain   . Depression   . Dry mouth   . Easy bruising   . Excessive thirst   . Fatigue   . Frequent urination   . GERD (gastroesophageal reflux disease)   . Headache   . Heat intolerance   . Hypertension   . Hypothyroidism   . Joint pain   . Leg pain   . Muscle stiffness   . Nervousness   . Palpitations   . Pre-diabetes   . Restless leg syndrome   . Shortness of breath on exertion   . Sleep apnea    does not use c-pap machine  . Stomach ulcer   . Stress   . Swelling of both lower extremities   . Trouble in sleeping   . Vitamin D deficiency   . Weakness     PAST SURGICAL HISTORY: Past Surgical History:  Procedure  Laterality Date  . APPENDECTOMY    . BLADDER SUSPENSION  12/29/2010   Procedure: TRANSVAGINAL TAPE (TVT) PROCEDURE;  Surgeon: Daria Pastures;  Location: [facility];  Service: Gynecology;  Laterality: N/A;  . BREAST SURGERY     left breast lumpectomy 1977  . COLONOSCOPY N/A 03/03/2016   Procedure: COLONOSCOPY;  Surgeon: Rogene Houston, MD;  Location: [facility];  Service: Endoscopy;  Laterality: N/A;  2:00 - moved to 12/14 @ 12:55 - Ann notified pt  . CYSTOCELE REPAIR  12/29/2010   Procedure: ANTERIOR REPAIR (CYSTOCELE);  Surgeon: Daria Pastures;  Location: [facility];  Service: Gynecology;  Laterality: N/A;  . CYSTOSCOPY  12/29/2010   Procedure: CYSTOSCOPY;  Surgeon: Daria Pastures;  Location: [facility];  Service: Gynecology;  Laterality: N/A;  . JOINT REPLACEMENT  2011   rt knee  . REPLACEMENT TOTAL KNEE Right 2011  . TONSILLECTOMY    . TOTAL KNEE ARTHROPLASTY Left 11/17/2014   Procedure: LEFT TOTAL KNEE ARTHROPLASTY;  Surgeon: Gaynelle Arabian, MD;  Location: [facility];  Service: Orthopedics;  Laterality: Left;  . TUBAL LIGATION    . VAGINAL HYSTERECTOMY  12/29/2010   Procedure: HYSTERECTOMY VAGINAL;  Surgeon: Daria Pastures;  Location: [facility];  Service: Gynecology;  Laterality: N/A;    SOCIAL HISTORY: Social History   Tobacco Use  . Smoking status: Never Smoker  . Smokeless tobacco: Never Used  Substance Use Topics  . Alcohol use: No  . Drug use: No    FAMILY HISTORY: Family History  Problem Relation Age of Onset  . Stroke Mother   . Hypertension Mother   . Hyperlipidemia Mother   . Thyroid disease Mother   . Heart disease Father   . COPD Father   . Cancer Father   . Depression Father   . Cancer Paternal Grandmother        breast  . Diabetes Paternal Grandfather     ROS: Review of Systems  Constitutional: Negative for weight loss.  Cardiovascular: Negative for chest pain.  Gastrointestinal: Negative for nausea and vomiting.  Musculoskeletal:       Negative  muscle weakness    PHYSICAL EXAM: Blood pressure 134/77, pulse (!) 59, temperature (!) 97.3 F (36.3 C), temperature source Oral, height 5\' 4"  (1.626 m), weight 218 lb (98.9 kg), last menstrual period 05/24/2010, SpO2 98 %. Body mass index is 37.42 kg/m. Physical Exam  Constitutional: She is oriented to person, place, and time. She appears well-developed and well-nourished.  Cardiovascular: Normal rate.  Pulmonary/Chest: Effort normal.  Musculoskeletal: Normal range of motion.  Neurological: She is oriented to person, place, and time.  Skin: Skin is warm and dry.  Psychiatric: She has a normal mood and affect. Her behavior is normal.  Vitals reviewed.   RECENT LABS AND TESTS: BMET    Component Value Date/Time   NA 145 (H) 11/28/2017 1403   K 4.6 11/28/2017 1403   CL 103 11/28/2017 1403   CO2 27 11/28/2017 1403   GLUCOSE 75 11/28/2017 1403   GLUCOSE 109 (H) 11/19/2014 0520   BUN 16 11/28/2017 1403   CREATININE 0.83 11/28/2017 1403   CREATININE 0.89 12/12/2012 0813   CALCIUM 9.6 11/28/2017 1403   GFRNONAA 76 11/28/2017 1403   GFRAA 87 11/28/2017 1403   Lab Results  Component Value Date   HGBA1C 5.4 11/28/2017   HGBA1C 5.2 07/20/2017   HGBA1C 5.4 06/12/2014   HGBA1C 5.4 11/21/2013   HGBA1C 5.4 05/01/2013   Lab Results  Component Value Date   INSULIN 7.8 11/28/2017   INSULIN 12.5 07/20/2017   CBC    Component Value Date/Time   WBC 5.5 11/28/2017 1403   WBC 11.6 (H) 11/19/2014 0520   RBC 4.93 11/28/2017 1403   RBC 3.88 11/19/2014 0520   HGB 14.0 11/28/2017 1403   HCT 43.5 11/28/2017 1403   PLT 201 06/20/2017 0907   MCV 88 11/28/2017 1403   MCH 28.4 11/28/2017 1403   MCH 28.9 11/19/2014 0520   MCHC 32.2 11/28/2017 1403   MCHC 32.7 11/19/2014 0520   RDW 14.8 11/28/2017 1403   LYMPHSABS 1.6 11/28/2017 1403   MONOABS 0.5 11/27/2013 1131   EOSABS 0.1 11/28/2017 1403   BASOSABS 0.0 11/28/2017 1403   Iron/TIBC/Ferritin/ %Sat    Component Value Date/Time    IRON 78 12/01/2016 0819   IRON 18 (L) 06/30/2016 0924   TIBC 315 12/01/2016 0819   TIBC 497 (H) 06/30/2016 0924   FERRITIN 20 07/14/2017 0828   IRONPCTSAT 25 12/01/2016 0819   Lipid Panel     Component Value Date/Time   CHOL 185 07/20/2017 1024   TRIG 113 07/20/2017 1024   HDL 43 07/20/2017 1024   CHOLHDL 4.8 (H) 06/20/2017 0907   CHOLHDL 4.2 11/21/2013 0712   VLDL 18 11/21/2013 0712   LDLCALC 119 (H) 07/20/2017 1024   Hepatic Function Panel     Component Value Date/Time   PROT 6.5 11/28/2017 1403   ALBUMIN 4.2 11/28/2017 1403   AST 17 11/28/2017 1403   ALT 17 11/28/2017 1403   ALKPHOS 105 11/28/2017 1403   BILITOT 0.5 11/28/2017 1403   BILIDIR 0.09 06/20/2017 0907   IBILI 0.3 12/12/2012 0813      Component Value Date/Time   TSH 1.050 11/28/2017 1403   TSH 2.030 07/14/2017 0828   TSH 2.300 01/10/2017 0826  Results for DRAYA, FELKER (MRN [id number]) as of 11/30/2017 08:29  Ref. Range 11/28/2017 14:03  Vitamin D, 25-Hydroxy Latest Ref Range: 30.0 - 100.0 ng/mL 43.4    ASSESSMENT AND PLAN: Vitamin D deficiency - Plan: CBC With Differential, Hemoglobin A1c, Insulin, random, VITAMIN D 25 Hydroxy (Vit-D Deficiency, Fractures), T3, T4, free, TSH  Essential hypertension - Plan: Comprehensive metabolic panel  History of anemia  At risk for osteoporosis  Class 2 severe obesity with serious comorbidity and body mass index (BMI) of 37.0 to 37.9 in adult, unspecified obesity type ([place])  PLAN:  Vitamin D Deficiency Raylinn was informed that low vitamin D levels contributes to fatigue and are associated with obesity, breast, and colon cancer. Karen Vazquez agrees to continue  taking prescription Vit D @50 ,000 IU every week and she will increase Vit D rich foods. She will follow up for routine testing of vitamin D, at least 2-3 times per year. She was informed of the risk of over-replacement of vitamin D and agrees to not increase her dose unless she discusses this with [place] first. We will  check labs and Karen Vazquez agrees to follow up with our clinic in 2 weeks.  At risk for osteopenia and osteoporosis Karen Vazquez was given extended  (15 minutes) osteoporosis prevention counseling today. Karen Vazquez is at risk for osteopenia and osteoporsis due to her vitamin D deficiency. She was encouraged to take her vitamin D and follow her higher calcium diet and increase strengthening exercise to help strengthen her bones and decrease her risk of osteopenia and osteoporosis.  Hypertension We discussed sodium restriction, working on healthy weight loss, and a regular exercise program as the means to achieve improved blood pressure control. Karen Vazquez agreed with this plan and agreed to follow up as directed. We will continue to monitor her blood pressure as well as her progress with the above lifestyle modifications. Karen Vazquez agrees to continue her anti-hypertensive medications and will watch for signs of hypotension as she continues her lifestyle modifications. We will check labs and Karen Vazquez agrees to follow up with our clinic in 2 weeks.  History of Anemia The diagnosis of Iron deficiency anemia was discussed with Karen Vazquez and was explained in detail. She was given suggestions of iron rich foods and iron supplement was not prescribed. Karen Vazquez is to continue taking [place] vitamins 1 tablet daily and we will check CBC today. Aritza agrees to follow up with our clinic in 2 weeks.  Obesity Karen Vazquez is currently in the action stage of change. As such, her goal is to continue with weight loss efforts She has agreed to follow the Category 2 plan Karen Vazquez has been instructed to work up to a goal of 150 minutes of combined cardio and strengthening exercise per week for weight loss and overall health benefits. We discussed the following Behavioral Modification Strategies today: increasing lean protein intake, decreasing simple carbohydrates, decrease eating out, work on meal planning and easy cooking plans, increase H20 intake, and no  skipping meals Karen Vazquez is to increase water to 64 ounces, and she will do more meal planning and less eating out.  Karen Vazquez has agreed to follow up with our clinic in 2 weeks. She was informed of the importance of frequent follow up visits to maximize her success with intensive lifestyle modifications for her multiple health conditions.   OBESITY BEHAVIORAL INTERVENTION VISIT  Today's visit was # 8   Starting weight: 239 lbs Starting date: 07/20/17 Today's weight : 218 lbs Today's date: 11/28/2017 Total lbs lost to date: 21    ASK: We discussed the diagnosis of obesity with Karen Vazquez today and Karen Vazquez agreed to give [place] permission to discuss obesity behavioral modification therapy today.  ASSESS: Karen Vazquez has the diagnosis of obesity and her BMI today is 61.4 Karen Vazquez is in the action stage of change   ADVISE: Karen Vazquez was educated on the multiple health risks of obesity as well as the benefit of weight loss to improve her health. She was advised of the need for long term treatment and the importance of lifestyle modifications to improve her current health and to decrease her risk of future health problems.  AGREE: Multiple dietary modification options and treatment options were discussed and  Karen Vazquez agreed to follow the recommendations documented in the  above note.  ARRANGE: Karen Vazquez was educated on the importance of frequent visits to treat obesity as outlined per CMS and USPSTF guidelines and agreed to schedule her next follow up appointment today.  Wilhemena Durie, am acting as transcriptionist for [employer], DO  I have reviewed the above documentation for accuracy and completeness, and I agree with the above. -Jearld Lesch, DO

## 2017-12-13 ENCOUNTER — Ambulatory Visit (INDEPENDENT_AMBULATORY_CARE_PROVIDER_SITE_OTHER): Payer: Federal, State, Local not specified - PPO | Admitting: Bariatrics

## 2017-12-13 VITALS — BP 136/76 | HR 73 | Temp 97.9°F | Ht 64.0 in | Wt 220.0 lb

## 2017-12-13 DIAGNOSIS — Z6837 Body mass index (BMI) 37.0-37.9, adult: Secondary | ICD-10-CM

## 2017-12-13 DIAGNOSIS — Z9189 Other specified personal risk factors, not elsewhere classified: Secondary | ICD-10-CM

## 2017-12-13 DIAGNOSIS — E559 Vitamin D deficiency, unspecified: Secondary | ICD-10-CM

## 2017-12-13 DIAGNOSIS — I10 Essential (primary) hypertension: Secondary | ICD-10-CM | POA: Diagnosis not present

## 2017-12-13 NOTE — Progress Notes (Signed)
Office: [phone]  /  Fax: [phone]   HPI:   Chief Complaint: OBESITY Karen Vazquez is here to discuss her progress with her obesity treatment plan. She is on the Category 2 plan and is following her eating plan approximately 75 % of the time. She states she is walking 30 minutes 2 to 3 times per week. Sin[place] has been visiting and not at home. She is not doing as much meal planning or being as mindful.  Her weight is 220 lb (99.8 kg) today and has not lost weight since her last visit. She has lost 19 lbs since starting treatment with [place].  Vitamin D deficiency Karen Vazquez has a diagnosis of vitamin D deficiency. She is currently taking high dose vit D and denies nausea, vomiting or muscle weakness.  At risk for osteopenia and osteoporosis Karen Vazquez is at higher risk of osteopenia and osteoporosis due to vitamin D deficiency.   Hypertension Karen Vazquez is a 62 y.o. female with hypertension. Karen Vazquez denies lightheadedness and hypotension. She is working on weight loss to help control her blood pressure with the goal of decreasing her risk of heart attack and stroke. Karen Vazquez is taking amlodipine 2.5mg .  ALLERGIES: Allergies  Allergen Reactions  . Lisinopril Cough    MEDICATIONS: Current Outpatient Medications on File Prior to Visit  Medication Sig Dispense Refill  . albuterol (PROVENTIL HFA;VENTOLIN HFA) 108 (90 Base) MCG/ACT inhaler Inhale 2 puffs into the lungs every 6 (six) hours as needed for wheezing or shortness of breath. 1 Inhaler 2  . ALPRAZolam (XANAX) 0.5 MG tablet TAKE ONE TABLET BY MOUTH TWO TIMES DAILYAS NEEDED FOR ANXIETY OR SLEEP. 30 tablet 5  . amLODipine (NORVASC) 2.5 MG tablet Take 1 tablet (2.5 mg total) by mouth daily. 30 tablet 5  . ARMOUR THYROID 15 MG tablet TAKE ONE (1) TABLET EACH DAY 30 tablet 5  . ARMOUR THYROID 30 MG tablet TAKE ONE (1) TABLET EACH DAY 30 tablet 5  . benzonatate (TESSALON) 100 MG capsule Take 1 capsule (100 mg total) by mouth 2 (two) times  daily as needed for cough. 20 capsule 0  . meloxicam (MOBIC) 15 MG tablet Take 1 tablet (15 mg total) by mouth daily. 30 tablet 0  . oxybutynin (DITROPAN XL) 5 MG 24 hr tablet Take 1 tablet (5 mg total) by mouth daily as needed. 30 tablet 5  . Pediatric Multiple Vit-C-FA (FLINSTONES GUMMIES OMEGA-3 DHA PO) Take 1 tablet by mouth daily.    Marland Kitchen rOPINIRole (REQUIP) 4 MG tablet TAKE 1 TABLET (4 MG TOTAL) BY MOUTH EVERY EVENING 30 tablet 12  . scopolamine (TRANSDERM-SCOP, 1.5 MG,) 1 MG/3DAYS Place one behind ear every 3 days 3 patch 2  . Vitamin D, Ergocalciferol, (DRISDOL) 50000 units CAPS capsule Take 1 capsule (50,000 Units total) by mouth every 7 (seven) days. 4 capsule 0  . zolpidem (AMBIEN) 10 MG tablet TAKE 1 TABLET (10 MG TOTAL) BY MOUTH AT BEDTIME AS NEEDED FOR SLEEP 90 tablet 1   No current facility-administered medications on file prior to visit.     PAST MEDICAL HISTORY: Past Medical History:  Diagnosis Date  . Anemia   . Anxiety   . Arthritis   . Back pain   . Blood transfusion 2011   at [facility]  . Bulging lumbar disc   . Chest pain   . Depression   . Dry mouth   . Easy bruising   . Excessive thirst   . Fatigue   . Frequent  urination   . GERD (gastroesophageal reflux disease)   . Headache   . Heat intolerance   . Hypertension   . Hypothyroidism   . Joint pain   . Leg pain   . Muscle stiffness   . Nervousness   . Palpitations   . Pre-diabetes   . Restless leg syndrome   . Shortness of breath on exertion   . Sleep apnea    does not use c-pap machine  . Stomach ulcer   . Stress   . Swelling of both lower extremities   . Trouble in sleeping   . Vitamin D deficiency   . Weakness     PAST SURGICAL HISTORY: Past Surgical History:  Procedure Laterality Date  . APPENDECTOMY    . BLADDER SUSPENSION  12/29/2010   Procedure: TRANSVAGINAL TAPE (TVT) PROCEDURE;  Surgeon: Daria Pastures;  Location: [facility];  Service: Gynecology;  Laterality: N/A;  . BREAST SURGERY      left breast lumpectomy 1977  . COLONOSCOPY N/A 03/03/2016   Procedure: COLONOSCOPY;  Surgeon: Rogene Houston, MD;  Location: [facility];  Service: Endoscopy;  Laterality: N/A;  2:00 - moved to 12/14 @ 12:55 - Ann notified pt  . CYSTOCELE REPAIR  12/29/2010   Procedure: ANTERIOR REPAIR (CYSTOCELE);  Surgeon: Daria Pastures;  Location: [facility];  Service: Gynecology;  Laterality: N/A;  . CYSTOSCOPY  12/29/2010   Procedure: CYSTOSCOPY;  Surgeon: Daria Pastures;  Location: [facility];  Service: Gynecology;  Laterality: N/A;  . JOINT REPLACEMENT  2011   rt knee  . REPLACEMENT TOTAL KNEE Right 2011  . TONSILLECTOMY    . TOTAL KNEE ARTHROPLASTY Left 11/17/2014   Procedure: LEFT TOTAL KNEE ARTHROPLASTY;  Surgeon: Gaynelle Arabian, MD;  Location: [facility];  Service: Orthopedics;  Laterality: Left;  . TUBAL LIGATION    . VAGINAL HYSTERECTOMY  12/29/2010   Procedure: HYSTERECTOMY VAGINAL;  Surgeon: Daria Pastures;  Location: [facility];  Service: Gynecology;  Laterality: N/A;    SOCIAL HISTORY: Social History   Tobacco Use  . Smoking status: Never Smoker  . Smokeless tobacco: Never Used  Substance Use Topics  . Alcohol use: No  . Drug use: No    FAMILY HISTORY: Family History  Problem Relation Age of Onset  . Stroke Mother   . Hypertension Mother   . Hyperlipidemia Mother   . Thyroid disease Mother   . Heart disease Father   . COPD Father   . Cancer Father   . Depression Father   . Cancer Paternal Grandmother        breast  . Diabetes Paternal Grandfather     ROS: Review of Systems  Constitutional: Negative for weight loss.  Cardiovascular:       Negative for hypotension.  Gastrointestinal: Negative for nausea and vomiting.  Musculoskeletal:       Negative for muscle weakness.  Neurological: Negative for headaches.    PHYSICAL EXAM: Blood pressure 136/76, pulse 73, temperature 97.9 F (36.6 C), temperature source Oral, height 5\' 4"  (1.626 m), weight 220 lb (99.8 kg),  last menstrual period 05/24/2010, SpO2 98 %. Body mass index is 37.76 kg/m. Physical Exam  Constitutional: She is oriented to person, place, and time. She appears well-developed and well-nourished.  Cardiovascular: Normal rate.  Pulmonary/Chest: Effort normal.  Musculoskeletal: Normal range of motion.  Neurological: She is oriented to person, place, and time.  Skin: Skin is warm and dry.  Psychiatric: She has a normal mood and  affect. Her behavior is normal.  Vitals reviewed.   RECENT LABS AND TESTS: BMET    Component Value Date/Time   NA 145 (H) 11/28/2017 1403   K 4.6 11/28/2017 1403   CL 103 11/28/2017 1403   CO2 27 11/28/2017 1403   GLUCOSE 75 11/28/2017 1403   GLUCOSE 109 (H) 11/19/2014 0520   BUN 16 11/28/2017 1403   CREATININE 0.83 11/28/2017 1403   CREATININE 0.89 12/12/2012 0813   CALCIUM 9.6 11/28/2017 1403   GFRNONAA 76 11/28/2017 1403   GFRAA 87 11/28/2017 1403   Lab Results  Component Value Date   HGBA1C 5.4 11/28/2017   HGBA1C 5.2 07/20/2017   HGBA1C 5.4 06/12/2014   HGBA1C 5.4 11/21/2013   HGBA1C 5.4 05/01/2013   Lab Results  Component Value Date   INSULIN 7.8 11/28/2017   INSULIN 12.5 07/20/2017   CBC    Component Value Date/Time   WBC 5.5 11/28/2017 1403   WBC 11.6 (H) 11/19/2014 0520   RBC 4.93 11/28/2017 1403   RBC 3.88 11/19/2014 0520   HGB 14.0 11/28/2017 1403   HCT 43.5 11/28/2017 1403   PLT 201 06/20/2017 0907   MCV 88 11/28/2017 1403   MCH 28.4 11/28/2017 1403   MCH 28.9 11/19/2014 0520   MCHC 32.2 11/28/2017 1403   MCHC 32.7 11/19/2014 0520   RDW 14.8 11/28/2017 1403   LYMPHSABS 1.6 11/28/2017 1403   MONOABS 0.5 11/27/2013 1131   EOSABS 0.1 11/28/2017 1403   BASOSABS 0.0 11/28/2017 1403   Iron/TIBC/Ferritin/ %Sat    Component Value Date/Time   IRON 78 12/01/2016 0819   IRON 18 (L) 06/30/2016 0924   TIBC 315 12/01/2016 0819   TIBC 497 (H) 06/30/2016 0924   FERRITIN 20 07/14/2017 0828   IRONPCTSAT 25 12/01/2016 0819    Lipid Panel     Component Value Date/Time   CHOL 185 07/20/2017 1024   TRIG 113 07/20/2017 1024   HDL 43 07/20/2017 1024   CHOLHDL 4.8 (H) 06/20/2017 0907   CHOLHDL 4.2 11/21/2013 0712   VLDL 18 11/21/2013 0712   LDLCALC 119 (H) 07/20/2017 1024   Hepatic Function Panel     Component Value Date/Time   PROT 6.5 11/28/2017 1403   ALBUMIN 4.2 11/28/2017 1403   AST 17 11/28/2017 1403   ALT 17 11/28/2017 1403   ALKPHOS 105 11/28/2017 1403   BILITOT 0.5 11/28/2017 1403   BILIDIR 0.09 06/20/2017 0907   IBILI 0.3 12/12/2012 0813      Component Value Date/Time   TSH 1.050 11/28/2017 1403   TSH 2.030 07/14/2017 0828   TSH 2.300 01/10/2017 0826   Results for MARCELIA, PETERSEN (MRN [id number]) as of 12/13/2017 16:55  Ref. Range 11/28/2017 14:03  Vitamin D, 25-Hydroxy Latest Ref Range: 30.0 - 100.0 ng/mL 43.4    ASSESSMENT AND PLAN: Vitamin D deficiency  Essential hypertension  At risk for osteoporosis  Class 2 severe obesity with serious comorbidity and body mass index (BMI) of 37.0 to 37.9 in adult, unspecified obesity type ([place])  PLAN:  Vitamin D Deficiency Karen Vazquez was informed that low vitamin D levels contributes to fatigue and are associated with obesity, breast, and colon cancer. She agrees to continue to take prescription Vit D @50 ,000 IU every week and will follow up for routine testing of vitamin D, at least 2-3 times per year. She was informed of the risk of over-replacement of vitamin D and agrees to not increase her dose unless she discusses this with [place] first. She agrees  to increase foods high in vitamin D and follow up as directed in 2 weeks.  At risk for osteopenia and osteoporosis Karen Vazquez was given extended (15 minutes) osteoporosis prevention counseling today. Karen Vazquez is at risk for osteopenia and osteoporosis due to her vitamin D deficiency. She was encouraged to take her vitamin D and follow her higher calcium diet and increase strengthening exercise to help  strengthen her bones and decrease her risk of osteopenia and osteoporosis.  Hypertension We discussed sodium restriction, working on healthy weight loss, and a regular exercise program as the means to achieve improved blood pressure control. Karen Vazquez agreed with this plan and agreed to follow up as directed. We will continue to monitor her blood pressure as well as her progress with the above lifestyle modifications. She agrees to continue her medications as prescribed for hypertension and reduce sodium rich foods in her diet. She will watch for signs of hypotension as she continues her lifestyle modifications and follow up in 2 weeks.  Obesity Karen Vazquez is currently in the action stage of change. As such, her goal is to continue with weight loss efforts. She has agreed to follow the Category 2 plan. She will be more mindful of food choices and work on diet planning. Karen Vazquez has been instructed to walk for 30 minutes for 2 to 3 times a week and ride a stationary bike once a week and work up to a goal of 150 minutes of combined cardio and strengthening exercise per week for weight loss and overall health benefits. We discussed the following Behavioral Modification Strategies today: increasing lean protein intake, decreasing simple carbohydrates , increasing vegetables, increase H2O intake, decreasing sodium intake, decrease eating out, and no skipping meals.  Karen Vazquez has agreed to follow up with our clinic in 2 weeks. She was informed of the importance of frequent follow up visits to maximize her success with intensive lifestyle modifications for her multiple health conditions.   OBESITY BEHAVIORAL INTERVENTION VISIT  Today's visit was # 9   Starting weight: 239 lbs Starting date: 07/20/17 Today's weight : Weight: 220 lb (99.8 kg)  Today's date: 12/13/2017 Total lbs lost to date: 16  ASK: We discussed the diagnosis of obesity with Karen Vazquez today and Karen Vazquez agreed to give [place] permission to discuss  obesity behavioral modification therapy today.  ASSESS: Karen Vazquez has the diagnosis of obesity and her BMI today is 37.74. Karen Vazquez is in the action stage of change.   ADVISE: Karen Vazquez was educated on the multiple health risks of obesity as well as the benefit of weight loss to improve her health. She was advised of the need for long term treatment and the importance of lifestyle modifications to improve her current health and to decrease her risk of future health problems.  AGREE: Multiple dietary modification options and treatment options were discussed and Karen Vazquez agreed to follow the recommendations documented in the above note.  ARRANGE: Karen Vazquez was educated on the importance of frequent visits to treat obesity as outlined per CMS and USPSTF guidelines and agreed to schedule her next follow up appointment today.  I, Marcille Blanco, am acting as Location manager for [employer]. Owens Shark, DO  I have reviewed the above documentation for accuracy and completeness, and I agree with the above. -Jearld Lesch, DO

## 2017-12-26 ENCOUNTER — Ambulatory Visit (INDEPENDENT_AMBULATORY_CARE_PROVIDER_SITE_OTHER): Payer: Federal, State, Local not specified - PPO | Admitting: Bariatrics

## 2017-12-26 ENCOUNTER — Telehealth: Payer: Self-pay | Admitting: Family Medicine

## 2017-12-26 VITALS — BP 151/82 | HR 53 | Temp 97.3°F | Ht 64.0 in | Wt 219.0 lb

## 2017-12-26 DIAGNOSIS — Z9189 Other specified personal risk factors, not elsewhere classified: Secondary | ICD-10-CM

## 2017-12-26 DIAGNOSIS — E559 Vitamin D deficiency, unspecified: Secondary | ICD-10-CM

## 2017-12-26 DIAGNOSIS — Z6837 Body mass index (BMI) 37.0-37.9, adult: Secondary | ICD-10-CM

## 2017-12-26 DIAGNOSIS — I10 Essential (primary) hypertension: Secondary | ICD-10-CM

## 2017-12-26 MED ORDER — VITAMIN D (ERGOCALCIFEROL) 1.25 MG (50000 UNIT) PO CAPS: 50000.0000 [IU] | ORAL_CAPSULE | ORAL | 0 refills | Status: DC

## 2017-12-26 NOTE — Telephone Encounter (Signed)
I am fine with her getting one It appears on this form that she will be the one to connect to get it The best I can tell there is nothing we need to do Feel free to look over the form

## 2017-12-26 NOTE — Telephone Encounter (Signed)
Form from [facility] stating patient may be eligible for free blood pressure monitor. In provider office to look over. Thanks.

## 2017-12-27 ENCOUNTER — Encounter (INDEPENDENT_AMBULATORY_CARE_PROVIDER_SITE_OTHER): Payer: Self-pay | Admitting: Bariatrics

## 2017-12-27 NOTE — Progress Notes (Signed)
Office: [phone]  /  Fax: [phone]   HPI:   Chief Complaint: OBESITY Karen Vazquez is here to discuss her progress with her obesity treatment plan. She is on the Category 2 plan and is following her eating plan approximately 75-80 % of the time. She states she is exercising 0 minutes 0 times per week. Karen Vazquez decreased exercise due to increased knee pain. She is doing ok with the plan, but not drinking enough water. She is doing some stress eating at night.  Her weight is 219 lb (99.3 kg) today and has had a weight loss of 1 pound over a period of 2 weeks since her last visit. She has lost 20 lbs since starting treatment with [place].  Vitamin D Deficiency Karen Vazquez has a diagnosis of vitamin D deficiency. She is taking high dose prescription Vit D and denies nausea, vomiting or muscle weakness.  At risk for osteopenia and osteoporosis Karen Vazquez is at higher risk of osteopenia and osteoporosis due to vitamin D deficiency.   Hypertension Karen Vazquez is a 62 y.o. female with hypertension. Reign's blood pressure is elevated. She is taking amlodipine 2.5 mg and denies lightheadedness. She is working weight loss to help control her blood pressure with the goal of decreasing her risk of heart attack and stroke. Karen Vazquez's blood pressure is not currently controlled.  ALLERGIES: Allergies  Allergen Reactions  . Lisinopril Cough    MEDICATIONS: Current Outpatient Medications on File Prior to Visit  Medication Sig Dispense Refill  . albuterol (PROVENTIL HFA;VENTOLIN HFA) 108 (90 Base) MCG/ACT inhaler Inhale 2 puffs into the lungs every 6 (six) hours as needed for wheezing or shortness of breath. 1 Inhaler 2  . ALPRAZolam (XANAX) 0.5 MG tablet TAKE ONE TABLET BY MOUTH TWO TIMES DAILYAS NEEDED FOR ANXIETY OR SLEEP. 30 tablet 5  . amLODipine (NORVASC) 2.5 MG tablet Take 1 tablet (2.5 mg total) by mouth daily. 30 tablet 5  . ARMOUR THYROID 15 MG tablet TAKE ONE (1) TABLET EACH DAY 30 tablet 5  . ARMOUR  THYROID 30 MG tablet TAKE ONE (1) TABLET EACH DAY 30 tablet 5  . benzonatate (TESSALON) 100 MG capsule Take 1 capsule (100 mg total) by mouth 2 (two) times daily as needed for cough. 20 capsule 0  . meloxicam (MOBIC) 15 MG tablet Take 1 tablet (15 mg total) by mouth daily. 30 tablet 0  . oxybutynin (DITROPAN XL) 5 MG 24 hr tablet Take 1 tablet (5 mg total) by mouth daily as needed. 30 tablet 5  . Pediatric Multiple Vit-C-FA (FLINSTONES GUMMIES OMEGA-3 DHA PO) Take 1 tablet by mouth daily.    Marland Kitchen rOPINIRole (REQUIP) 4 MG tablet TAKE 1 TABLET (4 MG TOTAL) BY MOUTH EVERY EVENING 30 tablet 12  . scopolamine (TRANSDERM-SCOP, 1.5 MG,) 1 MG/3DAYS Place one behind ear every 3 days 3 patch 2  . zolpidem (AMBIEN) 10 MG tablet TAKE 1 TABLET (10 MG TOTAL) BY MOUTH AT BEDTIME AS NEEDED FOR SLEEP 90 tablet 1   No current facility-administered medications on file prior to visit.     PAST MEDICAL HISTORY: Past Medical History:  Diagnosis Date  . Anemia   . Anxiety   . Arthritis   . Back pain   . Blood transfusion 2011   at [facility]  . Bulging lumbar disc   . Chest pain   . Depression   . Dry mouth   . Easy bruising   . Excessive thirst   . Fatigue   . Frequent urination   .  GERD (gastroesophageal reflux disease)   . Headache   . Heat intolerance   . Hypertension   . Hypothyroidism   . Joint pain   . Leg pain   . Muscle stiffness   . Nervousness   . Palpitations   . Pre-diabetes   . Restless leg syndrome   . Shortness of breath on exertion   . Sleep apnea    does not use c-pap machine  . Stomach ulcer   . Stress   . Swelling of both lower extremities   . Trouble in sleeping   . Vitamin D deficiency   . Weakness     PAST SURGICAL HISTORY: Past Surgical History:  Procedure Laterality Date  . APPENDECTOMY    . BLADDER SUSPENSION  12/29/2010   Procedure: TRANSVAGINAL TAPE (TVT) PROCEDURE;  Surgeon: Daria Pastures;  Location: [facility];  Service: Gynecology;  Laterality: N/A;  . BREAST  SURGERY     left breast lumpectomy 1977  . COLONOSCOPY N/A 03/03/2016   Procedure: COLONOSCOPY;  Surgeon: Rogene Houston, MD;  Location: [facility];  Service: Endoscopy;  Laterality: N/A;  2:00 - moved to 12/14 @ 12:55 - Ann notified pt  . CYSTOCELE REPAIR  12/29/2010   Procedure: ANTERIOR REPAIR (CYSTOCELE);  Surgeon: Daria Pastures;  Location: [facility];  Service: Gynecology;  Laterality: N/A;  . CYSTOSCOPY  12/29/2010   Procedure: CYSTOSCOPY;  Surgeon: Daria Pastures;  Location: [facility];  Service: Gynecology;  Laterality: N/A;  . JOINT REPLACEMENT  2011   rt knee  . REPLACEMENT TOTAL KNEE Right 2011  . TONSILLECTOMY    . TOTAL KNEE ARTHROPLASTY Left 11/17/2014   Procedure: LEFT TOTAL KNEE ARTHROPLASTY;  Surgeon: Gaynelle Arabian, MD;  Location: [facility];  Service: Orthopedics;  Laterality: Left;  . TUBAL LIGATION    . VAGINAL HYSTERECTOMY  12/29/2010   Procedure: HYSTERECTOMY VAGINAL;  Surgeon: Daria Pastures;  Location: [facility];  Service: Gynecology;  Laterality: N/A;    SOCIAL HISTORY: Social History   Tobacco Use  . Smoking status: Never Smoker  . Smokeless tobacco: Never Used  Substance Use Topics  . Alcohol use: No  . Drug use: No    FAMILY HISTORY: Family History  Problem Relation Age of Onset  . Stroke Mother   . Hypertension Mother   . Hyperlipidemia Mother   . Thyroid disease Mother   . Heart disease Father   . COPD Father   . Cancer Father   . Depression Father   . Cancer Paternal Grandmother        breast  . Diabetes Paternal Grandfather     ROS: Review of Systems  Constitutional: Positive for weight loss.  Gastrointestinal: Negative for nausea and vomiting.  Musculoskeletal:       Negative muscle weakness + Knee pain  Neurological:       Negative lightheadedness    PHYSICAL EXAM: Blood pressure (!) 151/82, pulse (!) 53, temperature (!) 97.3 F (36.3 C), temperature source Oral, height 5\' 4"  (1.626 m), weight 219 lb (99.3 kg), last  menstrual period 05/24/2010, SpO2 99 %. Body mass index is 37.59 kg/m. Physical Exam  Constitutional: She is oriented to person, place, and time. She appears well-developed and well-nourished.  Cardiovascular: Normal rate.  Pulmonary/Chest: Effort normal.  Musculoskeletal: Normal range of motion.  Neurological: She is oriented to person, place, and time.  Skin: Skin is warm and dry.  Psychiatric: She has a normal mood and affect. Her behavior is normal.  Vitals reviewed.   RECENT LABS AND TESTS: BMET    Component Value Date/Time   NA 145 (H) 11/28/2017 1403   K 4.6 11/28/2017 1403   CL 103 11/28/2017 1403   CO2 27 11/28/2017 1403   GLUCOSE 75 11/28/2017 1403   GLUCOSE 109 (H) 11/19/2014 0520   BUN 16 11/28/2017 1403   CREATININE 0.83 11/28/2017 1403   CREATININE 0.89 12/12/2012 0813   CALCIUM 9.6 11/28/2017 1403   GFRNONAA 76 11/28/2017 1403   GFRAA 87 11/28/2017 1403   Lab Results  Component Value Date   HGBA1C 5.4 11/28/2017   HGBA1C 5.2 07/20/2017   HGBA1C 5.4 06/12/2014   HGBA1C 5.4 11/21/2013   HGBA1C 5.4 05/01/2013   Lab Results  Component Value Date   INSULIN 7.8 11/28/2017   INSULIN 12.5 07/20/2017   CBC    Component Value Date/Time   WBC 5.5 11/28/2017 1403   WBC 11.6 (H) 11/19/2014 0520   RBC 4.93 11/28/2017 1403   RBC 3.88 11/19/2014 0520   HGB 14.0 11/28/2017 1403   HCT 43.5 11/28/2017 1403   PLT 201 06/20/2017 0907   MCV 88 11/28/2017 1403   MCH 28.4 11/28/2017 1403   MCH 28.9 11/19/2014 0520   MCHC 32.2 11/28/2017 1403   MCHC 32.7 11/19/2014 0520   RDW 14.8 11/28/2017 1403   LYMPHSABS 1.6 11/28/2017 1403   MONOABS 0.5 11/27/2013 1131   EOSABS 0.1 11/28/2017 1403   BASOSABS 0.0 11/28/2017 1403   Iron/TIBC/Ferritin/ %Sat    Component Value Date/Time   IRON 78 12/01/2016 0819   IRON 18 (L) 06/30/2016 0924   TIBC 315 12/01/2016 0819   TIBC 497 (H) 06/30/2016 0924   FERRITIN 20 07/14/2017 0828   IRONPCTSAT 25 12/01/2016 0819   Lipid  Panel     Component Value Date/Time   CHOL 185 07/20/2017 1024   TRIG 113 07/20/2017 1024   HDL 43 07/20/2017 1024   CHOLHDL 4.8 (H) 06/20/2017 0907   CHOLHDL 4.2 11/21/2013 0712   VLDL 18 11/21/2013 0712   LDLCALC 119 (H) 07/20/2017 1024   Hepatic Function Panel     Component Value Date/Time   PROT 6.5 11/28/2017 1403   ALBUMIN 4.2 11/28/2017 1403   AST 17 11/28/2017 1403   ALT 17 11/28/2017 1403   ALKPHOS 105 11/28/2017 1403   BILITOT 0.5 11/28/2017 1403   BILIDIR 0.09 06/20/2017 0907   IBILI 0.3 12/12/2012 0813      Component Value Date/Time   TSH 1.050 11/28/2017 1403   TSH 2.030 07/14/2017 0828   TSH 2.300 01/10/2017 0826  Results for TEJAL, MONROY (MRN [id number]) as of 12/27/2017 12:31  Ref. Range 11/28/2017 14:03  Vitamin D, 25-Hydroxy Latest Ref Range: 30.0 - 100.0 ng/mL 43.4    ASSESSMENT AND PLAN: Vitamin D deficiency - Plan: Vitamin D, Ergocalciferol, (DRISDOL) 50000 units CAPS capsule  Essential hypertension  At risk for osteoporosis  Class 2 severe obesity with serious comorbidity and body mass index (BMI) of 37.0 to 37.9 in adult, unspecified obesity type ([place])  PLAN:  Vitamin D Deficiency [place] was informed that low vitamin D levels contributes to fatigue and are associated with obesity, breast, and colon cancer. Ivette agrees to continue taking prescription Vit D @50 ,000 IU every week #4 and we will refill for 1 month. She will follow up for routine testing of vitamin D, at least 2-3 times per year. She was informed of the risk of over-replacement of vitamin D and agrees to not increase her  dose unless she discusses this with [place] first. Karen Vazquez agrees to follow up with our clinic in 2 weeks.  At risk for osteopenia and osteoporosis Karen Vazquez was given extended (15 minutes) osteoporosis prevention counseling today. Karen Vazquez is at risk for osteopenia and osteoporsis due to her vitamin D deficiency. She was encouraged to take her vitamin D and follow her higher  calcium diet and increase strengthening exercise to help strengthen her bones and decrease her risk of osteopenia and osteoporosis.  Hypertension We discussed sodium restriction, working on healthy weight loss, and a regular exercise program as the means to achieve improved blood pressure control. Karen Vazquez agreed with this plan and agreed to follow up as directed. We will continue to monitor her blood pressure as well as her progress with the above lifestyle modifications. Karen Vazquez agrees to continue her anti-hypertensive medications and will watch for signs of hypotension as she continues her lifestyle modifications. Karen Vazquez agrees to follow up with our clinic in 2 weeks.  Obesity Karen Vazquez is currently in the action stage of change. As such, her goal is to continue with weight loss efforts She has agreed to follow the Category 2 plan Karen Vazquez has been instructed to work up to a goal of 150 minutes of combined cardio and strengthening exercise per week for weight loss and overall health benefits. We discussed the following Behavioral Modification Strategies today: increasing lean protein intake, decreasing simple carbohydrates, increasing vegetables, decreasing sodium intake, decrease eating out, work on meal planning and easy cooking plans, increase H20 intake, no skipping meals, and keeping healthy foods in the home [place] will eat all of her food and will do more meal planning.  Karen Vazquez has agreed to follow up with our clinic in 2 weeks. She was informed of the importance of frequent follow up visits to maximize her success with intensive lifestyle modifications for her multiple health conditions.   OBESITY BEHAVIORAL INTERVENTION VISIT  Today's visit was # 10   Starting weight: 239 lbs Starting date: 07/20/17 Today's weight : 219 lbs  Today's date: 12/26/2017 Total lbs lost to date: 79    ASK: We discussed the diagnosis of obesity with Karen Vazquez today and Karen Vazquez agreed to give [place] permission to  discuss obesity behavioral modification therapy today.  ASSESS: Karen Vazquez has the diagnosis of obesity and her BMI today is 53.61 Karen Vazquez is in the action stage of change   ADVISE: Karen Vazquez was educated on the multiple health risks of obesity as well as the benefit of weight loss to improve her health. She was advised of the need for long term treatment and the importance of lifestyle modifications to improve her current health and to decrease her risk of future health problems.  AGREE: Multiple dietary modification options and treatment options were discussed and  Karen Vazquez agreed to follow the recommendations documented in the above note.  ARRANGE: Reese was educated on the importance of frequent visits to treat obesity as outlined per CMS and USPSTF guidelines and agreed to schedule her next follow up appointment today.  Wilhemena Durie, am acting as transcriptionist for [employer], DO  I have reviewed the above documentation for accuracy and completeness, and I agree with the above. -Jearld Lesch, DO

## 2017-12-27 NOTE — Telephone Encounter (Signed)
Discussed with pt. Pt states she is coming in on Monday and will get form at that time. Form up front for pickup.

## 2018-01-01 ENCOUNTER — Ambulatory Visit: Payer: Federal, State, Local not specified - PPO | Admitting: Family Medicine

## 2018-01-01 ENCOUNTER — Encounter: Payer: Self-pay | Admitting: Family Medicine

## 2018-01-01 VITALS — BP 126/74 | Ht 64.0 in | Wt 222.0 lb

## 2018-01-01 DIAGNOSIS — E038 Other specified hypothyroidism: Secondary | ICD-10-CM

## 2018-01-01 DIAGNOSIS — G4700 Insomnia, unspecified: Secondary | ICD-10-CM

## 2018-01-01 DIAGNOSIS — I10 Essential (primary) hypertension: Secondary | ICD-10-CM | POA: Diagnosis not present

## 2018-01-01 DIAGNOSIS — Z23 Encounter for immunization: Secondary | ICD-10-CM

## 2018-01-01 DIAGNOSIS — G4733 Obstructive sleep apnea (adult) (pediatric): Secondary | ICD-10-CM

## 2018-01-01 DIAGNOSIS — G2581 Restless legs syndrome: Secondary | ICD-10-CM

## 2018-01-01 MED ORDER — ALPRAZOLAM 0.5 MG PO TABS: ORAL_TABLET | ORAL | 5 refills | Status: DC

## 2018-01-01 MED ORDER — OXYBUTYNIN CHLORIDE ER 5 MG PO TB24: 5.0000 mg | ORAL_TABLET | Freq: Every day | ORAL | 5 refills | Status: DC | PRN

## 2018-01-01 MED ORDER — ZOLPIDEM TARTRATE 10 MG PO TABS: 10.0000 mg | ORAL_TABLET | Freq: Every evening | ORAL | 1 refills | Status: DC | PRN

## 2018-01-01 MED ORDER — ZOSTER VAC RECOMB ADJUVANTED 50 MCG/0.5ML IM SUSR: 0.5000 mL | Freq: Once | INTRAMUSCULAR | 1 refills | Status: AC

## 2018-01-01 MED ORDER — AMLODIPINE BESYLATE 2.5 MG PO TABS: 2.5000 mg | ORAL_TABLET | Freq: Every day | ORAL | 5 refills | Status: DC

## 2018-01-01 MED ORDER — ARMOUR THYROID 15 MG PO TABS: ORAL_TABLET | ORAL | 5 refills | Status: DC

## 2018-01-01 MED ORDER — ROPINIROLE HCL 4 MG PO TABS: 4.0000 mg | ORAL_TABLET | Freq: Every evening | ORAL | 12 refills | Status: DC

## 2018-01-01 MED ORDER — ARMOUR THYROID 30 MG PO TABS: ORAL_TABLET | ORAL | 5 refills | Status: DC

## 2018-01-01 NOTE — Progress Notes (Signed)
Subjective:    Patient ID: Karen Vazquez, female    DOB: [birth year], 62 y.o.   MRN: [id number]  Hypertension  This is a chronic problem. The current episode started more than 1 year ago. The problem has been gradually improving since onset. The problem is controlled. Pertinent negatives include no chest pain, headaches or shortness of breath. There are no associated agents to hypertension.  Insomnia  Primary symptoms: no fragmented sleep, no sleep disturbance.  The current episode started more than one year. The problem is unchanged.  Patient has thyroid condition.  Takes thyroid medication on a regular basis.  States that the proper way.  Relates compliance.  States no negative side effects.  States condition seems to be under good control. Patient does have restless legs she takes her medicine she states it does do good job of controlling it she likes the medicine she would like to have refills of it  She does have morbid obesity she is trying to watch her diet is trying to stay physically active trying to lose weight and watch her portions  Patient with obstructive sleep apnea she does not use her CPAP machine on a regular basis we talked at length about the importance of doing so  Patient is here today to follow up on chronic health issues.She eats healthy and gets some exercise.  Review of Systems  Constitutional: Negative for activity change, fatigue and fever.  HENT: Negative for congestion and rhinorrhea.   Respiratory: Negative for cough, chest tightness and shortness of breath.   Cardiovascular: Negative for chest pain and leg swelling.  Gastrointestinal: Negative for abdominal pain and nausea.  Skin: Negative for color change.  Neurological: Negative for dizziness and headaches.  Psychiatric/Behavioral: Negative for agitation, behavioral problems and sleep disturbance. The patient has insomnia.        Objective:   Physical Exam  Constitutional: She appears  well-nourished. No distress.  HENT:  Head: Normocephalic and atraumatic.  Eyes: Right eye exhibits no discharge. Left eye exhibits no discharge.  Neck: No tracheal deviation present.  Cardiovascular: Normal rate, regular rhythm and normal heart sounds.  No murmur heard. Pulmonary/Chest: Effort normal and breath sounds normal. No respiratory distress.  Musculoskeletal: She exhibits no edema.  Lymphadenopathy:    She has no cervical adenopathy.  Neurological: She is alert. Coordination normal.  Skin: Skin is warm and dry.  Psychiatric: She has a normal mood and affect. Her behavior is normal.  Vitals reviewed.         Assessment & Plan:  1. Essential hypertension Blood pressure overall good control continue current measures watch diet try to lose weight  2. Other specified hypothyroidism Thyroid doing well on current medication recent lab work looks good patient's overall symptoms are doing well continue current measures  3. Obstructive sleep apnea Patient needs to do a better job with sleep apnea usage of the machine on a regular basis she was encouraged to do it at least 90% of the time for at least 4 hours every night  4. Insomnia, unspecified type She does use Xanax and Ambien to help her with sleep she uses one or the other does not use both together  5. Restless legs syndrome She does take her medicine on a regular basis to help her with restless legs she states it is doing well  She will follow-up in 6 months  Morbid obesity she will work hard on healthy eating regular physical activity she follows through with the weight  loss center

## 2018-01-01 NOTE — Patient Instructions (Signed)

## 2018-01-09 ENCOUNTER — Encounter (INDEPENDENT_AMBULATORY_CARE_PROVIDER_SITE_OTHER): Payer: Self-pay

## 2018-01-09 ENCOUNTER — Ambulatory Visit (INDEPENDENT_AMBULATORY_CARE_PROVIDER_SITE_OTHER): Payer: Federal, State, Local not specified - PPO | Admitting: Bariatrics

## 2018-01-19 ENCOUNTER — Encounter (HOSPITAL_COMMUNITY): Payer: Self-pay | Admitting: Emergency Medicine

## 2018-01-19 ENCOUNTER — Emergency Department (HOSPITAL_COMMUNITY): Payer: Federal, State, Local not specified - PPO

## 2018-01-19 ENCOUNTER — Other Ambulatory Visit: Payer: Self-pay

## 2018-01-19 ENCOUNTER — Emergency Department (HOSPITAL_COMMUNITY)
Admission: EM | Admit: 2018-01-19 | Discharge: 2018-01-19 | Disposition: A | Discharge status: Home / Self Care | Payer: Federal, State, Local not specified - PPO | Attending: Emergency Medicine | Admitting: Emergency Medicine

## 2018-01-19 ENCOUNTER — Encounter (INDEPENDENT_AMBULATORY_CARE_PROVIDER_SITE_OTHER): Payer: Self-pay | Admitting: Bariatrics

## 2018-01-19 DIAGNOSIS — W0110XA Fall on same level from slipping, tripping and stumbling with subsequent striking against unspecified object, initial encounter: Secondary | ICD-10-CM | POA: Insufficient documentation

## 2018-01-19 DIAGNOSIS — Y999 Unspecified external cause status: Secondary | ICD-10-CM | POA: Diagnosis not present

## 2018-01-19 DIAGNOSIS — S52602A Unspecified fracture of lower end of left ulna, initial encounter for closed fracture: Secondary | ICD-10-CM | POA: Insufficient documentation

## 2018-01-19 DIAGNOSIS — S52572A Other intraarticular fracture of lower end of left radius, initial encounter for closed fracture: Secondary | ICD-10-CM | POA: Diagnosis not present

## 2018-01-19 DIAGNOSIS — E039 Hypothyroidism, unspecified: Secondary | ICD-10-CM | POA: Insufficient documentation

## 2018-01-19 DIAGNOSIS — S52692A Other fracture of lower end of left ulna, initial encounter for closed fracture: Secondary | ICD-10-CM | POA: Diagnosis not present

## 2018-01-19 DIAGNOSIS — S59912A Unspecified injury of left forearm, initial encounter: Secondary | ICD-10-CM | POA: Diagnosis not present

## 2018-01-19 DIAGNOSIS — Y929 Unspecified place or not applicable: Secondary | ICD-10-CM | POA: Diagnosis not present

## 2018-01-19 DIAGNOSIS — Y9301 Activity, walking, marching and hiking: Secondary | ICD-10-CM | POA: Insufficient documentation

## 2018-01-19 DIAGNOSIS — S52502A Unspecified fracture of the lower end of left radius, initial encounter for closed fracture: Secondary | ICD-10-CM

## 2018-01-19 DIAGNOSIS — Z96653 Presence of artificial knee joint, bilateral: Secondary | ICD-10-CM | POA: Insufficient documentation

## 2018-01-19 DIAGNOSIS — I10 Essential (primary) hypertension: Secondary | ICD-10-CM | POA: Insufficient documentation

## 2018-01-19 DIAGNOSIS — Z79899 Other long term (current) drug therapy: Secondary | ICD-10-CM | POA: Diagnosis not present

## 2018-01-19 DIAGNOSIS — S59292A Other physeal fracture of lower end of radius, left arm, initial encounter for closed fracture: Secondary | ICD-10-CM | POA: Diagnosis not present

## 2018-01-19 MED ORDER — OXYCODONE-ACETAMINOPHEN 5-325 MG PO TABS: 1.0000 | ORAL_TABLET | Freq: Four times a day (QID) | ORAL | 0 refills | Status: DC | PRN

## 2018-01-19 NOTE — ED Notes (Signed)
Pulling hose, tripped and fell trying to catch fall with L wrist  Now with pain to L wrist

## 2018-01-19 NOTE — ED Notes (Signed)
To rad 

## 2018-01-19 NOTE — ED Triage Notes (Addendum)
Charted on wrong patient

## 2018-01-19 NOTE — ED Notes (Signed)
From Rad   Pt reports has seen Dr Maureen Ralphs in the past and would like to be referred back should she need ortho

## 2018-01-19 NOTE — Discharge Instructions (Signed)
You are seen in the emergency department today after a fall.  Your x-ray shows that you have fractures of your radius and ulna, these are bones in the forearm and the wrist.  We have placed you into a splint, please keep the splint on until you have followed up with the hand surgeon at [place] within the next 1 week.  Also follow the instructions below.  Home care instructions: -- *PRICE in the first 24-48 hours after injury: Protect (with brace, splint, sling), if given by your provider Rest Ice- Do not apply ice pack directly to your skin, place towel or similar between your skin and ice/ice pack. Apply ice for 20 min, then remove for 40 min while awake Compression- Wear brace, elastic bandage, splint as directed by your provider Elevate affected extremity above the level of your heart when not walking around for the first 24-48 hours   Medications: Please take ibuprofen per over-the-counter dosing instructions to help with pain and swelling.  We are also send you home with a prescription for Percocet to help with severe pain. -Percocet-this is a narcotic/controlled substance medication that has potential addicting qualities.  We recommend that you take 1-2 tablets every 6 hours as needed for severe pain.  Do not drive or operate heavy machinery when taking this medicine as it can be sedating. Do not drink alcohol or take other sedating medications when taking this medicine for safety reasons.  Keep this out of reach of small children.  Please be aware this medicine has Tylenol in it (325 mg/tab) do not exceed the maximum dose of Tylenol in a day per over the counter recommendations should you decide to supplement with Tylenol over the counter.   We have prescribed you new medication(s) today. Discuss the medications prescribed today with your pharmacist as they can have adverse effects and interactions with your other medicines including over the counter and prescribed medications.  Seek medical evaluation if you start to experience new or abnormal symptoms after taking one of these medicines, seek care immediately if you start to experience difficulty breathing, feeling of your throat closing, facial swelling, or rash as these could be indications of a more serious allergic reaction  Return instructions:  Please return if your digits or extremity are numb or tingling, appear gray or blue, or you have severe pain (also elevate the extremity and loosen splint or wrap if you were given one) Please return if you have redness or fevers.  Please return to the Emergency Department if you experience worsening symptoms.  Please return if you have any other emergent concerns. Additional Information:  Your vital signs today were: BP 135/71 (BP Location: Right Arm)    Pulse 77    Temp 98 F (36.7 C) (Oral)    Resp 16    Ht 5\' 4"  (1.626 m)    Wt 99.3 kg    LMP 05/24/2010    SpO2 98%    BMI 37.59 kg/m  If your blood pressure (BP) was elevated above 135/85 this visit, please have this repeated by your doctor within one month. ---------------

## 2018-01-19 NOTE — ED Triage Notes (Signed)
Patient states she tripped over a hose and fell today injuring her left wrist.

## 2018-01-19 NOTE — ED Provider Notes (Signed)
[facility] EMERGENCY DEPARTMENT Provider Note   CSN: [id number] Arrival date & time: 01/19/18  1459     History   Chief Complaint Chief Complaint  Patient presents with  . Wrist Injury    HPI Karen Vazquez is a 62 y.o. female with a history of hypothyroidism, hyperlipidemia, hypertension, and sleep apnea who presents to the emergency department status post mechanical fall at 1130 this morning with complaints of left wrist pain.  Patient states that her feet got tangled and she tripped over a hose falling onto the palmar surface of the left wrist.  She denies head injury or loss of consciousness.  She states she is having pain specifically to the left wrist and nowhere else.  Pain is minimal at rest, however significantly worse with movement.  No intervention prior to arrival.  She does report associated swelling.  Denies numbness, weakness, or other areas of injury.  She is right-hand dominant.  HPI  Past Medical History:  Diagnosis Date  . Anemia   . Anxiety   . Arthritis   . Back pain   . Blood transfusion 2011   at [facility]  . Bulging lumbar disc   . Chest pain   . Depression   . Dry mouth   . Easy bruising   . Excessive thirst   . Fatigue   . Frequent urination   . GERD (gastroesophageal reflux disease)   . Headache   . Heat intolerance   . Hypertension   . Hypothyroidism   . Joint pain   . Leg pain   . Muscle stiffness   . Nervousness   . Palpitations   . Pre-diabetes   . Restless leg syndrome   . Shortness of breath on exertion   . Sleep apnea    does not use c-pap machine  . Stomach ulcer   . Stress   . Swelling of both lower extremities   . Trouble in sleeping   . Vitamin D deficiency   . Weakness     Patient Active Problem List   Diagnosis Date Noted  . Morbid obesity ([place]) 01/02/2017  . History of colonic polyps 04/26/2016  . OA (osteoarthritis) of knee 11/17/2014  . HTN (hypertension) 05/28/2014  . Obstructive sleep apnea 05/28/2014  .  Anemia, iron deficiency 12/16/2013  . Prediabetes 05/01/2013  . GERD (gastroesophageal reflux disease) 02/06/2013  . Hypothyroidism 12/12/2012  . Insomnia 12/12/2012  . Hyperlipidemia 12/12/2012  . Osteoarthritis 12/12/2012  . Restless legs syndrome 12/12/2012    Past Surgical History:  Procedure Laterality Date  . APPENDECTOMY    . BLADDER SUSPENSION  12/29/2010   Procedure: TRANSVAGINAL TAPE (TVT) PROCEDURE;  Surgeon: Daria Pastures;  Location: [place] [facility];  Service: Gynecology;  Laterality: N/A;  . BREAST SURGERY     left breast lumpectomy 1977  . COLONOSCOPY N/A 03/03/2016   Procedure: COLONOSCOPY;  Surgeon: Rogene Houston, MD;  Location: [facility];  Service: Endoscopy;  Laterality: N/A;  2:00 - moved to 12/14 @ 12:55 - Ann notified pt  . CYSTOCELE REPAIR  12/29/2010   Procedure: ANTERIOR REPAIR (CYSTOCELE);  Surgeon: Daria Pastures;  Location: [place] [facility];  Service: Gynecology;  Laterality: N/A;  . CYSTOSCOPY  12/29/2010   Procedure: CYSTOSCOPY;  Surgeon: Daria Pastures;  Location: [facility];  Service: Gynecology;  Laterality: N/A;  . JOINT REPLACEMENT  2011   rt knee  . REPLACEMENT TOTAL KNEE Right 2011  . TONSILLECTOMY    . TOTAL KNEE ARTHROPLASTY  Left 11/17/2014   Procedure: LEFT TOTAL KNEE ARTHROPLASTY;  Surgeon: Gaynelle Arabian, MD;  Location: [facility];  Service: Orthopedics;  Laterality: Left;  . TUBAL LIGATION    . VAGINAL HYSTERECTOMY  12/29/2010   Procedure: HYSTERECTOMY VAGINAL;  Surgeon: Daria Pastures;  Location: [place] [facility];  Service: Gynecology;  Laterality: N/A;     OB History    Gravida  2   Para  2   Term  2   Preterm      AB      Living  2     SAB      TAB      Ectopic      Multiple      Live Births               Home Medications    Prior to Admission medications   Medication Sig Start Date End Date Taking? Authorizing Provider  albuterol (PROVENTIL HFA;VENTOLIN HFA) 108 (90 Base) MCG/ACT inhaler Inhale 2 puffs into the lungs  every 6 (six) hours as needed for wheezing or shortness of breath. 03/02/17   Mikey Kirschner, MD  ALPRAZolam Duanne Moron) 0.5 MG tablet TAKE ONE TABLET BY MOUTH TWO TIMES DAILYAS NEEDED FOR ANXIETY OR SLEEP. 01/01/18   Kathyrn Drown, MD  amLODipine (NORVASC) 2.5 MG tablet Take 1 tablet (2.5 mg total) by mouth daily. 01/01/18   Kathyrn Drown, MD  ARMOUR THYROID 15 MG tablet TAKE ONE (1) TABLET EACH DAY 01/01/18   Kathyrn Drown, MD  ARMOUR THYROID 30 MG tablet TAKE ONE (1) TABLET EACH DAY 01/01/18   Kathyrn Drown, MD  benzonatate (TESSALON) 100 MG capsule Take 1 capsule (100 mg total) by mouth 2 (two) times daily as needed for cough. 03/02/17   Mikey Kirschner, MD  meloxicam (MOBIC) 15 MG tablet Take 1 tablet (15 mg total) by mouth daily. 06/19/17   Nilda Simmer, NP  oxybutynin (DITROPAN XL) 5 MG 24 hr tablet Take 1 tablet (5 mg total) by mouth daily as needed. 01/01/18   Kathyrn Drown, MD  Pediatric Multiple Vit-C-FA (FLINSTONES GUMMIES OMEGA-3 DHA PO) Take 1 tablet by mouth daily.    [provider]  rOPINIRole (REQUIP) 4 MG tablet Take 1 tablet (4 mg total) by mouth every evening. 01/01/18   Kathyrn Drown, MD  scopolamine (TRANSDERM-SCOP, 1.5 MG,) 1 MG/3DAYS Place one behind ear every 3 days 10/04/17   Kathyrn Drown, MD  Vitamin D, Ergocalciferol, (DRISDOL) 50000 units CAPS capsule Take 1 capsule (50,000 Units total) by mouth every 7 (seven) days. 12/26/17   Jearld Lesch A, DO  zolpidem (AMBIEN) 10 MG tablet Take 1 tablet (10 mg total) by mouth at bedtime as needed for sleep. 01/01/18   Kathyrn Drown, MD    Family History Family History  Problem Relation Age of Onset  . Stroke Mother   . Hypertension Mother   . Hyperlipidemia Mother   . Thyroid disease Mother   . Heart disease Father   . COPD Father   . Cancer Father   . Depression Father   . Cancer Paternal Grandmother        breast  . Diabetes Paternal Grandfather     Social History Social History    Tobacco Use  . Smoking status: Never Smoker  . Smokeless tobacco: Never Used  Substance Use Topics  . Alcohol use: No  . Drug use: No     Allergies   Lisinopril  Review of Systems Review of Systems  Constitutional: Negative for chills and fever.  Musculoskeletal: Positive for arthralgias (L wrist) and joint swelling (L wrist). Negative for back pain and neck pain.  Neurological: Negative for weakness, numbness and headaches.     Physical Exam Updated Vital Signs BP 135/71 (BP Location: Right Arm)   Pulse 77   Temp 98 F (36.7 C) (Oral)   Resp 16   Ht 5\' 4"  (1.626 m)   Wt 99.3 kg   LMP 05/24/2010   SpO2 98%   BMI 37.59 kg/m   Physical Exam  Constitutional: She appears well-developed and well-nourished. No distress.  HENT:  Head: Normocephalic and atraumatic. Head is without raccoon's eyes and without Battle's sign.  Eyes: Conjunctivae and EOM are normal. Right eye exhibits no discharge. Left eye exhibits no discharge.  Neck: Normal range of motion. Neck supple. No spinous process tenderness present.  Cardiovascular: Normal rate and regular rhythm.  Pulses:      Radial pulses are 2+ on the right side, and 2+ on the left side.  Pulmonary/Chest: Effort normal and breath sounds normal.  Abdominal: Soft. She exhibits no distension. There is no tenderness.  Musculoskeletal:  Upper extremities: Patient has soft tissue swelling to the dorsal aspect of the left wrist, more so radially.  No obvious deformity, ecchymosis, or open wounds.  She has full active range of motion to bilateral shoulders, elbows, as well as all IP/MCP joints.  She has full range of motion of the right wrist.  Her left wrist has some mild limitation in flexion extension secondary to pain, however she is able to move through the majority of these motions.  She is tender to palpation diffusely along the distal third of the forearm as well as the wrist extending to the carpals. No tenderness to the  metacarpals or phalanges.  Neurovascularly intact distally.  No tenderness to the elbow including no medial/lateral epicondyles tenderness olecranon tenderness, or tenderness over the radial head. Back: No midline tenderness to palpation Lower extremities: Normal range of motion.  Nontender.  Neurological: She is alert.  Clear speech.  Sensation grossly intact bilateral upper extremities.  5 out of 5 symmetric grip strength.  Able to perform okay sign, thumbs up, and cross second/third digits.  Ambulatory  Psychiatric: She has a normal mood and affect. Her behavior is normal. Thought content normal.  Nursing note and vitals reviewed.  ED Treatments / Results  Labs (all labs ordered are listed, but only abnormal results are displayed) Labs Reviewed - No data to display  EKG None  Radiology Dg Forearm Left  Result Date: 01/19/2018 CLINICAL DATA:  Left forearm pain after fall today. EXAM: LEFT FOREARM - 2 VIEW COMPARISON:  None. FINDINGS: Mildly displaced fracture is seen involving the distal left radius with intra-articular extension. The ulna appears normal. No soft tissue abnormality is noted. IMPRESSION: Mildly displaced distal left radial fracture with intra-articular extension. Electronically Signed   By: Marijo Conception, M.D.   On: 01/19/2018 15:59   Dg Wrist Complete Left  Result Date: 01/19/2018 CLINICAL DATA: Fall.  Pain. EXAM: LEFT WRIST - COMPLETE 3+ VIEW COMPARISON:  No prior. FINDINGS: Distal radial metaphyseal fracture noted. Mild angulation deformity. Nondisplaced ulnar styloid fracture noted. Diffuse degenerative change. IMPRESSION: Fractures of the distal radial metaphysis and ulnar styloid noted as above. Electronically Signed   By: Marcello Moores  Register   On: 01/19/2018 15:55   Dg Hand Complete Left  Result Date: 01/19/2018 CLINICAL DATA:  Left  hand pain after fall today. EXAM: LEFT HAND - COMPLETE 3+ VIEW COMPARISON:  None. FINDINGS: Mildly displaced fracture is seen involving  the distal left radius with intra-articular extension. Mild osteoarthritis of first carpometacarpal joint is noted. No soft tissue abnormality is noted. IMPRESSION: Mildly displaced distal left radial fracture is noted with intra-articular extension. Electronically Signed   By: Marijo Conception, M.D.   On: 01/19/2018 15:58    Procedures Procedures (including critical care time) SPLINT APPLICATION Date/Time: 7:40 PM Authorized by: Kennith Maes Consent: Verbal consent obtained. Risks and benefits: risks, benefits and alternatives were discussed Consent given by: patient Splint applied by: RN Location details: LUE Splint type: Sugar tong Post-procedure: The splinted body part was neurovascularly unchanged following the procedure. Patient tolerance: Patient tolerated the procedure well with no immediate complications.  Medications Ordered in ED Medications - No data to display   Initial Impression / Assessment and Plan / ED Course  I have reviewed the triage vital signs and the nursing notes.  Pertinent labs & imaging results that were available during my care of the patient were reviewed by me and considered in my medical decision making (see chart for details).   Patient presents to the ED status post mechanical fall with complaints of left wrist pain.  Patient nontoxic-appearing, no apparent distress, vitals WNL.  No evidence of serious head, neck, or back injury. L wrist with swelling without obvious deformity or open wounds. ROM mildly limited at the wrist. Xrays confirm mildly displaced distal left radial fracture with intra-articular extension as well as ulnar styloid fracture per detailed reports above.  NVI distally. Discussed with supervising physician Dr. Gilford Raid- recommends sugar tong splint with hand surgery follow up. Splint applied. PRICE recommended. Short course of percocet for pain. [place] Controlled Substance reporting System queried. I discussed results,  treatment plan, need for follow-up, and return precautions with the patient. Provided opportunity for questions, patient confirmed understanding and is in agreement with plan.   Patient has been seen by Dr. Maureen Ralphs w/ [facility] for prior orthopedic related complaints, she is requesting to follow up at this office after having had pleasant experiences- she spoke with his office prior to discharge in order to make an appointment with hand surgeon at their practice for sometime early next week.   Final Clinical Impressions(s) / ED Diagnoses   Final diagnoses:  Closed fracture of distal ends of left radius and ulna, initial encounter    ED Discharge Orders         Ordered    oxyCODONE-acetaminophen (PERCOCET/ROXICET) 5-325 MG tablet  Every 6 hours PRN     01/19/18 1616           Solly Derasmo, [place] R, PA-C 01/19/18 1714    Virgel Manifold, MD 01/20/18 1555

## 2018-01-22 DIAGNOSIS — M25532 Pain in left wrist: Secondary | ICD-10-CM | POA: Diagnosis not present

## 2018-01-22 DIAGNOSIS — S52509A Unspecified fracture of the lower end of unspecified radius, initial encounter for closed fracture: Secondary | ICD-10-CM | POA: Insufficient documentation

## 2018-01-22 DIAGNOSIS — S52592A Other fractures of lower end of left radius, initial encounter for closed fracture: Secondary | ICD-10-CM | POA: Diagnosis not present

## 2018-01-24 ENCOUNTER — Ambulatory Visit (INDEPENDENT_AMBULATORY_CARE_PROVIDER_SITE_OTHER): Payer: Federal, State, Local not specified - PPO | Admitting: Family Medicine

## 2018-01-25 ENCOUNTER — Ambulatory Visit (INDEPENDENT_AMBULATORY_CARE_PROVIDER_SITE_OTHER): Payer: Federal, State, Local not specified - PPO | Admitting: Family Medicine

## 2018-01-25 ENCOUNTER — Encounter (INDEPENDENT_AMBULATORY_CARE_PROVIDER_SITE_OTHER): Payer: Self-pay | Admitting: Family Medicine

## 2018-01-25 VITALS — BP 149/80 | HR 67 | Temp 97.9°F | Ht 64.0 in | Wt 219.0 lb

## 2018-01-25 DIAGNOSIS — Z6837 Body mass index (BMI) 37.0-37.9, adult: Secondary | ICD-10-CM

## 2018-01-25 DIAGNOSIS — E559 Vitamin D deficiency, unspecified: Secondary | ICD-10-CM | POA: Diagnosis not present

## 2018-01-25 DIAGNOSIS — Z9189 Other specified personal risk factors, not elsewhere classified: Secondary | ICD-10-CM | POA: Diagnosis not present

## 2018-01-25 DIAGNOSIS — I10 Essential (primary) hypertension: Secondary | ICD-10-CM

## 2018-01-25 MED ORDER — VITAMIN D (ERGOCALCIFEROL) 1.25 MG (50000 UNIT) PO CAPS: 50000.0000 [IU] | ORAL_CAPSULE | ORAL | 0 refills | Status: DC

## 2018-01-29 ENCOUNTER — Encounter (INDEPENDENT_AMBULATORY_CARE_PROVIDER_SITE_OTHER): Payer: Self-pay | Admitting: Family Medicine

## 2018-01-29 NOTE — Progress Notes (Signed)
Office: [phone]  /  Fax: [phone]   HPI:   Chief Complaint: OBESITY Karen Vazquez is here to discuss her progress with her obesity treatment plan. She is on the Category 2 plan and is following her eating plan approximately 50 % of the time. She states she is walking 60 minutes 1 time per week. Karen Vazquez fractured her wrist last week and she has a cast on. She has been off track with her meal plan. She has also had a recent death in the family.  Her weight is 219 lb (99.3 kg) today and has not lost weight since her last visit. She has lost 20 lbs since starting treatment with [place].  Vitamin D deficiency Karen Vazquez has a diagnosis of vitamin D deficiency. She is currently taking vit D and is stable, but not at goal. She denies nausea, vomiting, or muscle weakness.  At risk for osteopenia and osteoporosis Karen Vazquez is at higher risk of osteopenia and osteoporosis due to vitamin D deficiency.   Hypertension Karen Vazquez is a 62 y.o. female with hypertension. She is working on weight loss to help control her blood pressure with the goal of decreasing her risk of heart attack and stroke. Teagen's blood pressure is 149/80 and is not well controlled today. She has not been taking her blood pressure pill this week. Okie denies chest pain, shortness of breath on exertion, or headaches.  ALLERGIES: Allergies  Allergen Reactions  . Lisinopril Cough    MEDICATIONS: Current Outpatient Medications on File Prior to Visit  Medication Sig Dispense Refill  . albuterol (PROVENTIL HFA;VENTOLIN HFA) 108 (90 Base) MCG/ACT inhaler Inhale 2 puffs into the lungs every 6 (six) hours as needed for wheezing or shortness of breath. 1 Inhaler 2  . ALPRAZolam (XANAX) 0.5 MG tablet TAKE ONE TABLET BY MOUTH TWO TIMES DAILYAS NEEDED FOR ANXIETY OR SLEEP. 30 tablet 5  . amLODipine (NORVASC) 2.5 MG tablet Take 1 tablet (2.5 mg total) by mouth daily. 30 tablet 5  . ARMOUR THYROID 15 MG tablet TAKE ONE (1) TABLET EACH DAY 30  tablet 5  . ARMOUR THYROID 30 MG tablet TAKE ONE (1) TABLET EACH DAY 30 tablet 5  . benzonatate (TESSALON) 100 MG capsule Take 1 capsule (100 mg total) by mouth 2 (two) times daily as needed for cough. 20 capsule 0  . meloxicam (MOBIC) 15 MG tablet Take 1 tablet (15 mg total) by mouth daily. 30 tablet 0  . oxybutynin (DITROPAN XL) 5 MG 24 hr tablet Take 1 tablet (5 mg total) by mouth daily as needed. 30 tablet 5  . oxyCODONE-acetaminophen (PERCOCET/ROXICET) 5-325 MG tablet Take 1-2 tablets by mouth every 6 (six) hours as needed for severe pain. 10 tablet 0  . Pediatric Multiple Vit-C-FA (FLINSTONES GUMMIES OMEGA-3 DHA PO) Take 1 tablet by mouth daily.    Marland Kitchen rOPINIRole (REQUIP) 4 MG tablet Take 1 tablet (4 mg total) by mouth every evening. 30 tablet 12  . scopolamine (TRANSDERM-SCOP, 1.5 MG,) 1 MG/3DAYS Place one behind ear every 3 days 3 patch 2  . zolpidem (AMBIEN) 10 MG tablet Take 1 tablet (10 mg total) by mouth at bedtime as needed for sleep. 90 tablet 1   No current facility-administered medications on file prior to visit.     PAST MEDICAL HISTORY: Past Medical History:  Diagnosis Date  . Anemia   . Anxiety   . Arthritis   . Back pain   . Blood transfusion 2011   at [facility]  . Bulging  lumbar disc   . Chest pain   . Depression   . Dry mouth   . Easy bruising   . Excessive thirst   . Fatigue   . Frequent urination   . GERD (gastroesophageal reflux disease)   . Headache   . Heat intolerance   . Hypertension   . Hypothyroidism   . Joint pain   . Leg pain   . Muscle stiffness   . Nervousness   . Palpitations   . Pre-diabetes   . Restless leg syndrome   . Shortness of breath on exertion   . Sleep apnea    does not use c-pap machine  . Stomach ulcer   . Stress   . Swelling of both lower extremities   . Trouble in sleeping   . Vitamin D deficiency   . Weakness     PAST SURGICAL HISTORY: Past Surgical History:  Procedure Laterality Date  . APPENDECTOMY    . BLADDER  SUSPENSION  12/29/2010   Procedure: TRANSVAGINAL TAPE (TVT) PROCEDURE;  Surgeon: Daria Pastures;  Location: [facility];  Service: Gynecology;  Laterality: N/A;  . BREAST SURGERY     left breast lumpectomy 1977  . COLONOSCOPY N/A 03/03/2016   Procedure: COLONOSCOPY;  Surgeon: Rogene Houston, MD;  Location: [facility];  Service: Endoscopy;  Laterality: N/A;  2:00 - moved to 12/14 @ 12:55 - Ann notified pt  . CYSTOCELE REPAIR  12/29/2010   Procedure: ANTERIOR REPAIR (CYSTOCELE);  Surgeon: Daria Pastures;  Location: [facility];  Service: Gynecology;  Laterality: N/A;  . CYSTOSCOPY  12/29/2010   Procedure: CYSTOSCOPY;  Surgeon: Daria Pastures;  Location: [facility];  Service: Gynecology;  Laterality: N/A;  . JOINT REPLACEMENT  2011   rt knee  . REPLACEMENT TOTAL KNEE Right 2011  . TONSILLECTOMY    . TOTAL KNEE ARTHROPLASTY Left 11/17/2014   Procedure: LEFT TOTAL KNEE ARTHROPLASTY;  Surgeon: Gaynelle Arabian, MD;  Location: [facility];  Service: Orthopedics;  Laterality: Left;  . TUBAL LIGATION    . VAGINAL HYSTERECTOMY  12/29/2010   Procedure: HYSTERECTOMY VAGINAL;  Surgeon: Daria Pastures;  Location: [facility];  Service: Gynecology;  Laterality: N/A;    SOCIAL HISTORY: Social History   Tobacco Use  . Smoking status: Never Smoker  . Smokeless tobacco: Never Used  Substance Use Topics  . Alcohol use: No  . Drug use: No    FAMILY HISTORY: Family History  Problem Relation Age of Onset  . Stroke Mother   . Hypertension Mother   . Hyperlipidemia Mother   . Thyroid disease Mother   . Heart disease Father   . COPD Father   . Cancer Father   . Depression Father   . Cancer Paternal Grandmother        breast  . Diabetes Paternal Grandfather     ROS: Review of Systems  Constitutional: Negative for weight loss.  Respiratory: Negative for shortness of breath.   Cardiovascular: Negative for chest pain.  Gastrointestinal: Positive for nausea. Negative for vomiting.  Musculoskeletal:        Negative for muscle weakness.  Neurological: Negative for headaches.    PHYSICAL EXAM: Blood pressure (!) 149/80, pulse 67, temperature 97.9 F (36.6 C), temperature source Oral, height 5\' 4"  (1.626 m), weight 219 lb (99.3 kg), last menstrual period 05/24/2010, SpO2 97 %. Body mass index is 37.59 kg/m. Physical Exam  Constitutional: She is oriented to person, place, and time. She appears well-developed and well-nourished.  Cardiovascular: Normal rate.  Pulmonary/Chest: Effort normal.  Musculoskeletal: Normal range of motion.  Neurological: She is oriented to person, place, and time.  Skin: Skin is warm and dry.  Psychiatric: She has a normal mood and affect. Her behavior is normal.  Vitals reviewed.   RECENT LABS AND TESTS: BMET    Component Value Date/Time   NA 145 (H) 11/28/2017 1403   K 4.6 11/28/2017 1403   CL 103 11/28/2017 1403   CO2 27 11/28/2017 1403   GLUCOSE 75 11/28/2017 1403   GLUCOSE 109 (H) 11/19/2014 0520   BUN 16 11/28/2017 1403   CREATININE 0.83 11/28/2017 1403   CREATININE 0.89 12/12/2012 0813   CALCIUM 9.6 11/28/2017 1403   GFRNONAA 76 11/28/2017 1403   GFRAA 87 11/28/2017 1403   Lab Results  Component Value Date   HGBA1C 5.4 11/28/2017   HGBA1C 5.2 07/20/2017   HGBA1C 5.4 06/12/2014   HGBA1C 5.4 11/21/2013   HGBA1C 5.4 05/01/2013   Lab Results  Component Value Date   INSULIN 7.8 11/28/2017   INSULIN 12.5 07/20/2017   CBC    Component Value Date/Time   WBC 5.5 11/28/2017 1403   WBC 11.6 (H) 11/19/2014 0520   RBC 4.93 11/28/2017 1403   RBC 3.88 11/19/2014 0520   HGB 14.0 11/28/2017 1403   HCT 43.5 11/28/2017 1403   PLT 201 06/20/2017 0907   MCV 88 11/28/2017 1403   MCH 28.4 11/28/2017 1403   MCH 28.9 11/19/2014 0520   MCHC 32.2 11/28/2017 1403   MCHC 32.7 11/19/2014 0520   RDW 14.8 11/28/2017 1403   LYMPHSABS 1.6 11/28/2017 1403   MONOABS 0.5 11/27/2013 1131   EOSABS 0.1 11/28/2017 1403   BASOSABS 0.0 11/28/2017 1403    Iron/TIBC/Ferritin/ %Sat    Component Value Date/Time   IRON 78 12/01/2016 0819   IRON 18 (L) 06/30/2016 0924   TIBC 315 12/01/2016 0819   TIBC 497 (H) 06/30/2016 0924   FERRITIN 20 07/14/2017 0828   IRONPCTSAT 25 12/01/2016 0819   Lipid Panel     Component Value Date/Time   CHOL 185 07/20/2017 1024   TRIG 113 07/20/2017 1024   HDL 43 07/20/2017 1024   CHOLHDL 4.8 (H) 06/20/2017 0907   CHOLHDL 4.2 11/21/2013 0712   VLDL 18 11/21/2013 0712   LDLCALC 119 (H) 07/20/2017 1024   Hepatic Function Panel     Component Value Date/Time   PROT 6.5 11/28/2017 1403   ALBUMIN 4.2 11/28/2017 1403   AST 17 11/28/2017 1403   ALT 17 11/28/2017 1403   ALKPHOS 105 11/28/2017 1403   BILITOT 0.5 11/28/2017 1403   BILIDIR 0.09 06/20/2017 0907   IBILI 0.3 12/12/2012 0813      Component Value Date/Time   TSH 1.050 11/28/2017 1403   TSH 2.030 07/14/2017 0828   TSH 2.300 01/10/2017 0826   Results for LANETA, GUERIN (MRN [id number]) as of 01/29/2018 12:51  Ref. Range 11/28/2017 14:03  Vitamin D, 25-Hydroxy Latest Ref Range: 30.0 - 100.0 ng/mL 43.4   ASSESSMENT AND PLAN: Vitamin D deficiency - Plan: Vitamin D, Ergocalciferol, (DRISDOL) 1.25 MG (50000 UT) CAPS capsule  Essential hypertension  At risk for osteoporosis  Class 2 severe obesity with serious comorbidity and body mass index (BMI) of 37.0 to 37.9 in adult, unspecified obesity type ([place])  PLAN:  Vitamin D Deficiency [place] was informed that low vitamin D levels contributes to fatigue and are associated with obesity, breast, and colon cancer. She agrees to continue to take prescription  Vit D @50 ,000 IU every week #4 with no refills and will follow up for routine testing of vitamin D, at least 2-3 times per year. She was informed of the risk of over-replacement of vitamin D and agrees to not increase her dose unless she discusses this with [place] first. Christine agrees to follow up in 3 weeks.  At risk for osteopenia and  osteoporosis Teola was given extended (15 minutes) osteoporosis prevention counseling today. Nariyah is at risk for osteopenia and osteoporosis due to her vitamin D deficiency. She was encouraged to take her vitamin D and follow her higher calcium diet and increase strengthening exercise to help strengthen her bones and decrease her risk of osteopenia and osteoporosis.  Hypertension We discussed sodium restriction, working on healthy weight loss, and a regular exercise program as the means to achieve improved blood pressure control. We will continue to monitor her blood pressure as well as her progress with the above lifestyle modifications. She agrees to continue her Norvasc 2.5 mg daily as prescribed and better compliance with the medicine was encouraged. She will watch for signs of hypotension as she continues her lifestyle modifications. Claude agreed with this plan and agreed to follow up as directed.  Obesity Lacinda is currently in the action stage of change. As such, her goal is to continue with weight loss efforts. She has agreed to follow the Category 2 plan and keep a food journal of 400 to 500 calories and 35+ grams of protein for supper. She was provided with the "Protein Content" and the "Journaling" information handout. Yariana has been instructed to continue walking for 60 minutes 1 day a week. We discussed the following Behavioral Modification Strategies today: work on meal planning and easy cooking plans, holiday eating strategies, and planning for success.   Mykhia has agreed to follow up with our clinic in 3 weeks. She was informed of the importance of frequent follow up visits to maximize her success with intensive lifestyle modifications for her multiple health conditions.   OBESITY BEHAVIORAL INTERVENTION VISIT  Today's visit was # 11   Starting weight: 239 lbs Starting date: 08/08/17 Today's weight : Weight: 219 lb (99.3 kg)  Today's date: 01/25/2018 Total lbs lost to date:  20  ASK: We discussed the diagnosis of obesity with [name] today and Santiana agreed to give [place] permission to discuss obesity behavioral modification therapy today.  ASSESS: Fonda has the diagnosis of obesity and her BMI today is 32.99 Romelle is in the action stage of change.   ADVISE: Stephonie was educated on the multiple health risks of obesity as well as the benefit of weight loss to improve her health. She was advised of the need for long term treatment and the importance of lifestyle modifications to improve her current health and to decrease her risk of future health problems.  AGREE: Multiple dietary modification options and treatment options were discussed and Shawnie agreed to follow the recommendations documented in the above note.  ARRANGE: Anicia was educated on the importance of frequent visits to treat obesity as outlined per CMS and USPSTF guidelines and agreed to schedule her next follow up appointment today.  Lenward Chancellor, am acting as Location manager for Georgianne Fick, FNP.  I have reviewed the above documentation for accuracy and completeness, and I agree with the above.  - Keelyn Fjelstad, FNP-C.

## 2018-01-30 ENCOUNTER — Other Ambulatory Visit: Payer: Self-pay | Admitting: Family Medicine

## 2018-02-07 DIAGNOSIS — S52592A Other fractures of lower end of left radius, initial encounter for closed fracture: Secondary | ICD-10-CM | POA: Diagnosis not present

## 2018-02-14 ENCOUNTER — Encounter (INDEPENDENT_AMBULATORY_CARE_PROVIDER_SITE_OTHER): Payer: Self-pay

## 2018-02-14 ENCOUNTER — Ambulatory Visit (INDEPENDENT_AMBULATORY_CARE_PROVIDER_SITE_OTHER): Payer: Federal, State, Local not specified - PPO | Admitting: Family Medicine

## 2018-02-19 DIAGNOSIS — M25532 Pain in left wrist: Secondary | ICD-10-CM | POA: Diagnosis not present

## 2018-02-19 DIAGNOSIS — S52592A Other fractures of lower end of left radius, initial encounter for closed fracture: Secondary | ICD-10-CM | POA: Diagnosis not present

## 2018-02-19 DIAGNOSIS — S52515D Nondisplaced fracture of left radial styloid process, subsequent encounter for closed fracture with routine healing: Secondary | ICD-10-CM | POA: Diagnosis not present

## 2018-03-07 DIAGNOSIS — S52515D Nondisplaced fracture of left radial styloid process, subsequent encounter for closed fracture with routine healing: Secondary | ICD-10-CM | POA: Diagnosis not present

## 2018-03-07 DIAGNOSIS — S52592A Other fractures of lower end of left radius, initial encounter for closed fracture: Secondary | ICD-10-CM | POA: Diagnosis not present

## 2018-03-07 DIAGNOSIS — M25632 Stiffness of left wrist, not elsewhere classified: Secondary | ICD-10-CM | POA: Diagnosis not present

## 2018-03-07 DIAGNOSIS — M25532 Pain in left wrist: Secondary | ICD-10-CM | POA: Diagnosis not present

## 2018-03-22 DIAGNOSIS — M25632 Stiffness of left wrist, not elsewhere classified: Secondary | ICD-10-CM | POA: Diagnosis not present

## 2018-03-28 DIAGNOSIS — S52515D Nondisplaced fracture of left radial styloid process, subsequent encounter for closed fracture with routine healing: Secondary | ICD-10-CM | POA: Diagnosis not present

## 2018-03-28 DIAGNOSIS — M25532 Pain in left wrist: Secondary | ICD-10-CM | POA: Diagnosis not present

## 2018-03-28 DIAGNOSIS — M25632 Stiffness of left wrist, not elsewhere classified: Secondary | ICD-10-CM | POA: Diagnosis not present

## 2018-04-11 DIAGNOSIS — M25632 Stiffness of left wrist, not elsewhere classified: Secondary | ICD-10-CM | POA: Diagnosis not present

## 2018-04-25 DIAGNOSIS — Z1272 Encounter for screening for malignant neoplasm of vagina: Secondary | ICD-10-CM | POA: Diagnosis not present

## 2018-04-25 DIAGNOSIS — R350 Frequency of micturition: Secondary | ICD-10-CM | POA: Diagnosis not present

## 2018-04-25 DIAGNOSIS — Z1231 Encounter for screening mammogram for malignant neoplasm of breast: Secondary | ICD-10-CM | POA: Diagnosis not present

## 2018-04-25 DIAGNOSIS — Z6839 Body mass index (BMI) 39.0-39.9, adult: Secondary | ICD-10-CM | POA: Diagnosis not present

## 2018-04-25 DIAGNOSIS — M25632 Stiffness of left wrist, not elsewhere classified: Secondary | ICD-10-CM | POA: Diagnosis not present

## 2018-04-25 DIAGNOSIS — Z01419 Encounter for gynecological examination (general) (routine) without abnormal findings: Secondary | ICD-10-CM | POA: Diagnosis not present

## 2018-05-15 ENCOUNTER — Telehealth: Payer: Self-pay | Admitting: Family Medicine

## 2018-05-15 NOTE — Telephone Encounter (Signed)
Pt returned call. Pt states that the issue is that since she in on [place], they do not want to cover no more that 30 days in 365 days. Contacted Jonni Sanger at [employer] and he states that the provider would need to call [phone] to do an over ride. Called patient again to inform her but left a voicemail to return call. Will call [phone] to get a better understanding of what is going on.

## 2018-05-15 NOTE — Telephone Encounter (Signed)
Received authorization dates 04/15/2018-05/15/2019. Jonni Sanger at [employer] ran and states it was approved. The patient is aware to pick up.

## 2018-05-15 NOTE — Telephone Encounter (Signed)
PA needed for Ambien 10 mg. Take one tablet by mouth at bedtime prn sleep. PA automatically approved through cover my meds. PA good through 04/15/2018-05/15/2019. Fax from [facility] also needs to be faxed back when PA approved. Fax sent to pharmacy. Left message to return call to inform patient.

## 2018-05-29 DIAGNOSIS — X32XXXA Exposure to sunlight, initial encounter: Secondary | ICD-10-CM | POA: Diagnosis not present

## 2018-05-29 DIAGNOSIS — L570 Actinic keratosis: Secondary | ICD-10-CM | POA: Diagnosis not present

## 2018-05-29 DIAGNOSIS — L720 Epidermal cyst: Secondary | ICD-10-CM | POA: Diagnosis not present

## 2018-06-28 ENCOUNTER — Ambulatory Visit (INDEPENDENT_AMBULATORY_CARE_PROVIDER_SITE_OTHER): Payer: Federal, State, Local not specified - PPO | Admitting: Family Medicine

## 2018-06-28 ENCOUNTER — Other Ambulatory Visit: Payer: Self-pay

## 2018-06-28 ENCOUNTER — Encounter: Payer: Self-pay | Admitting: Family Medicine

## 2018-06-28 DIAGNOSIS — E559 Vitamin D deficiency, unspecified: Secondary | ICD-10-CM

## 2018-06-28 DIAGNOSIS — G4700 Insomnia, unspecified: Secondary | ICD-10-CM

## 2018-06-28 DIAGNOSIS — E038 Other specified hypothyroidism: Secondary | ICD-10-CM

## 2018-06-28 DIAGNOSIS — I10 Essential (primary) hypertension: Secondary | ICD-10-CM

## 2018-06-28 DIAGNOSIS — G2581 Restless legs syndrome: Secondary | ICD-10-CM

## 2018-06-28 MED ORDER — ARMOUR THYROID 15 MG PO TABS: ORAL_TABLET | ORAL | 5 refills | Status: DC

## 2018-06-28 MED ORDER — ALPRAZOLAM 0.5 MG PO TABS: ORAL_TABLET | ORAL | 5 refills | Status: DC

## 2018-06-28 MED ORDER — THYROID 30 MG PO TABS: ORAL_TABLET | ORAL | 5 refills | Status: DC

## 2018-06-28 MED ORDER — ROPINIROLE HCL 4 MG PO TABS: 4.0000 mg | ORAL_TABLET | Freq: Every evening | ORAL | 5 refills | Status: DC

## 2018-06-28 MED ORDER — AMLODIPINE BESYLATE 2.5 MG PO TABS: 2.5000 mg | ORAL_TABLET | Freq: Every day | ORAL | 5 refills | Status: DC

## 2018-06-28 MED ORDER — ZOLPIDEM TARTRATE 10 MG PO TABS: 10.0000 mg | ORAL_TABLET | Freq: Every evening | ORAL | 1 refills | Status: DC | PRN

## 2018-06-28 NOTE — Progress Notes (Signed)
   Subjective:    Patient ID: Karen Vazquez, female    DOB: [birth year], 63 y.o.   MRN: [id number]  Hypertension  This is a chronic problem. There are no compliance problems.   Patient relates compliance with her medicine takes it on an every other day basis states blood pressure readings overall are  good.  She does states she is trying to eat somewhat healthy and stay physically active  Restless legs does not bother medication does good job she will continue this  Has history of vitamin D deficiency recheck lab work in the summertime  Thyroid patient takes 2 thyroid supplements she states it does a good job we will check lab work in the summertime  Insomnia uses medication at night helps her refills were given  BP has been good. Only takes Amlodipine every other day. Sometimes his BP is high, sometimes BP is doing good. Pt wondering if she should stay on Amlodipine where she  Is only doing every other day.   Virtual Visit via Video Note  I connected with Karen Vazquez on 40/34/74 at 10:00 AM EDT by a video enabled telemedicine application and verified that I am speaking with the correct person using two identifiers.   I discussed the limitations of evaluation and management by telemedicine and the availability of in person appointments. The patient expressed understanding and agreed to proceed.  History of Present Illness:    Observations/Objective:   Assessment and Plan:   Follow Up Instructions:    I discussed the assessment and treatment plan with the patient. The patient was provided an opportunity to ask questions and all were answered. The patient agreed with the plan and demonstrated an understanding of the instructions.   The patient was advised to call back or seek an in-person evaluation if the symptoms worsen or if the condition fails to improve as anticipated.  I provided 15 minutes of non-face-to-face time during this encounter.   Vicente Males, LPN   Review of Systems     Objective:   Physical Exam  Patient does not appear to be in any distress over the virtual visit 15 minutes was spent with patient today discussing healthcare issues which they came.  More than 50% of this visit-total duration of visit-was spent in counseling and coordination of care.  Please see diagnosis regarding the focus of this coordination and care       Assessment & Plan:  HTN reasonable control with use medicine every other day as well as blood pressures look good Thyroid continue current medication check labs in the summer Vitamin D deficiency check labs in the summer Insomnia medication refills given Restless legs refills given Follow-up in 6 months follow-up sooner problems

## 2018-07-04 ENCOUNTER — Ambulatory Visit: Payer: Federal, State, Local not specified - PPO | Admitting: Family Medicine

## 2018-07-19 ENCOUNTER — Encounter (INDEPENDENT_AMBULATORY_CARE_PROVIDER_SITE_OTHER): Payer: Self-pay | Admitting: Family Medicine

## 2018-07-23 ENCOUNTER — Other Ambulatory Visit: Payer: Self-pay

## 2018-07-23 ENCOUNTER — Encounter (INDEPENDENT_AMBULATORY_CARE_PROVIDER_SITE_OTHER): Payer: Self-pay | Admitting: Bariatrics

## 2018-07-23 ENCOUNTER — Ambulatory Visit (INDEPENDENT_AMBULATORY_CARE_PROVIDER_SITE_OTHER): Payer: Federal, State, Local not specified - PPO | Admitting: Bariatrics

## 2018-07-23 DIAGNOSIS — E559 Vitamin D deficiency, unspecified: Secondary | ICD-10-CM | POA: Diagnosis not present

## 2018-07-23 DIAGNOSIS — E538 Deficiency of other specified B group vitamins: Secondary | ICD-10-CM

## 2018-07-23 DIAGNOSIS — I10 Essential (primary) hypertension: Secondary | ICD-10-CM | POA: Diagnosis not present

## 2018-07-23 DIAGNOSIS — Z6837 Body mass index (BMI) 37.0-37.9, adult: Secondary | ICD-10-CM

## 2018-07-24 DIAGNOSIS — E559 Vitamin D deficiency, unspecified: Secondary | ICD-10-CM | POA: Diagnosis not present

## 2018-07-24 DIAGNOSIS — I10 Essential (primary) hypertension: Secondary | ICD-10-CM | POA: Diagnosis not present

## 2018-07-24 DIAGNOSIS — E538 Deficiency of other specified B group vitamins: Secondary | ICD-10-CM | POA: Diagnosis not present

## 2018-07-24 NOTE — Progress Notes (Signed)
Office: [phone]  /  Fax: [phone] TeleHealth Visit:  Karen Vazquez has verbally consented to this TeleHealth visit today. The patient is located in her car (driving in [place]), the provider is located at the [employer] [facility] office. The participants in this visit include the listed provider and patient and any and all parties involved. The visit was conducted today via FaceTime.  HPI:   Chief Complaint: OBESITY Karen Vazquez is here to discuss her progress with her obesity treatment plan. She is on the Category 2 plan and is following her eating plan approximately 0 % of the time. She states she is walking and riding her stationary bike 60 minutes 3 times per week. Liliane has not been here since 01/25/18. She has gained back 18 pounds per patient. We were unable to weigh the patient today for this TeleHealth visit. She feels as if she has gained weight since her last visit. She has lost 1 lb since starting treatment with [place].  Hypertension Karen Vazquez is a 63 y.o. female with hypertension. She is taking Amlodipine. Her last blood pressure was 140/90. Karen Vazquez denies chest pain or shortness of breath on exertion. She is working weight loss to help control her blood pressure with the goal of decreasing her risk of heart attack and stroke. Alices blood pressure is not well controlled.  Vitamin D deficiency Karen Vazquez has a diagnosis of vitamin D deficiency. She is currently taking vit D and denies nausea, vomiting or muscle weakness.  C62 Deficiency Karen Vazquez has a diagnosis of B12 insufficiency and notes fatigue. She had a previously low normal. Rumor is not a vegetarian. She denies paresthesias.  ASSESSMENT AND PLAN:  Vitamin D deficiency - Plan: VITAMIN D 25 Hydroxy (Vit-D Deficiency, Fractures), T3, T4, free, TSH  Essential hypertension - Plan: Comprehensive metabolic panel, Hemoglobin A1c, Insulin, random, Lipid Panel With LDL/HDL Ratio  B12 nutritional deficiency -  Plan: Vitamin B12  Class 2 severe obesity with serious comorbidity and body mass index (BMI) of 37.0 to 37.9 in adult, unspecified obesity type (HCC)  PLAN:  Hypertension We discussed sodium restriction, working on healthy weight loss, and a regular exercise program as the means to achieve improved blood pressure control. Quanda agreed with this plan and agreed to follow up as directed. We will continue to monitor her blood pressure as well as her progress with the above lifestyle modifications. She will continue her medications as prescribed and started back one amlodipine once daily  and will watch for signs of hypotension as she continues her lifestyle modifications.  Vitamin D Deficiency Karen Vazquez was informed that low vitamin D levels contributes to fatigue and are associated with obesity, breast, and colon cancer. She agrees to change prescription Vit D @50 ,000 IU to every other week #2 with no refills and follow up for routine testing of vitamin D, at least 2-3 times per year. She was informed of the risk of over-replacement of vitamin D and agrees to not increase her dose unless she discusses this with [place] first. Karen Vazquez agrees to follow up as directed.  B76 Deficiency Karen Vazquez will work on increasing B12 rich foods in her diet. B12 supplementation was not prescribed today. We will check B12 level today and Karen Vazquez will follow up with our clinic in 2 weeks.  Obesity Karen Vazquez is currently in the action stage of change. As such, her goal is to continue with weight loss efforts She has agreed to keep a food journal with 400 to 500  calories and 35 grams of protein at supper daily and follow the Category 2 plan Karen Vazquez will continue with exercise (stationary bike) for weight loss and overall health benefits. We discussed the following Behavioral Modification Strategies today: increase H2O intake, no skipping meals, keeping healthy foods in the home, better snacking choices, increasing lean protein intake,  decreasing simple carbohydrates, increasing vegetables, decrease eating out and work on meal planning and intentional eating We will review labs at the next visit. She will weigh herself at home before each visit.  Karen Vazquez has agreed to follow up with our clinic in 2 weeks. She was informed of the importance of frequent follow up visits to maximize her success with intensive lifestyle modifications for her multiple health conditions.  ALLERGIES: Allergies  Allergen Reactions   Lisinopril Cough    MEDICATIONS: Current Outpatient Medications on File Prior to Visit  Medication Sig Dispense Refill   albuterol (PROVENTIL HFA;VENTOLIN HFA) 108 (90 Base) MCG/ACT inhaler Inhale 2 puffs into the lungs every 6 (six) hours as needed for wheezing or shortness of breath. 1 Inhaler 2   ALPRAZolam (XANAX) 0.5 MG tablet TAKE ONE TABLET BY MOUTH TWO TIMES DAILYAS NEEDED FOR ANXIETY OR SLEEP. 30 tablet 5   amLODipine (NORVASC) 2.5 MG tablet Take 1 tablet (2.5 mg total) by mouth daily. 30 tablet 5   ARMOUR THYROID 15 MG tablet TAKE ONE (1) TABLET EACH DAY 30 tablet 5   benzonatate (TESSALON) 100 MG capsule Take 1 capsule (100 mg total) by mouth 2 (two) times daily as needed for cough. 20 capsule 0   meloxicam (MOBIC) 15 MG tablet Take 1 tablet (15 mg total) by mouth daily. 30 tablet 0   oxybutynin (DITROPAN XL) 5 MG 24 hr tablet Take 1 tablet (5 mg total) by mouth daily as needed. 30 tablet 5   oxyCODONE-acetaminophen (PERCOCET/ROXICET) 5-325 MG tablet Take 1-2 tablets by mouth every 6 (six) hours as needed for severe pain. 10 tablet 0   Pediatric Multiple Vit-C-FA (FLINSTONES GUMMIES OMEGA-3 DHA PO) Take 1 tablet by mouth daily.     rOPINIRole (REQUIP) 4 MG tablet Take 1 tablet (4 mg total) by mouth every evening. 30 tablet 5   scopolamine (TRANSDERM-SCOP, 1.5 MG,) 1 MG/3DAYS Place one behind ear every 3 days 3 patch 2   thyroid (ARMOUR THYROID) 30 MG tablet TAKE ONE (1) TABLET BY MOUTH EVERY DAY  30 tablet 5   Vitamin D, Ergocalciferol, (DRISDOL) 1.25 MG (50000 UT) CAPS capsule Take 1 capsule (50,000 Units total) by mouth every 7 (seven) days. 4 capsule 0   zolpidem (AMBIEN) 10 MG tablet Take 1 tablet (10 mg total) by mouth at bedtime as needed for sleep. 90 tablet 1   No current facility-administered medications on file prior to visit.     PAST MEDICAL HISTORY: Past Medical History:  Diagnosis Date   Anemia    Anxiety    Arthritis    Back pain    Blood transfusion 2011   at [facility]   Bulging lumbar disc    Chest pain    Depression    Dry mouth    Easy bruising    Excessive thirst    Fatigue    Frequent urination    GERD (gastroesophageal reflux disease)    Headache    Heat intolerance    Hypertension    Hypothyroidism    Joint pain    Leg pain    Muscle stiffness    Nervousness    Palpitations  Pre-diabetes    Restless leg syndrome    Shortness of breath on exertion    Sleep apnea    does not use c-pap machine   Stomach ulcer    Stress    Swelling of both lower extremities    Trouble in sleeping    Vitamin D deficiency    Weakness     PAST SURGICAL HISTORY: Past Surgical History:  Procedure Laterality Date   APPENDECTOMY     BLADDER SUSPENSION  12/29/2010   Procedure: TRANSVAGINAL TAPE (TVT) PROCEDURE;  Surgeon: Daria Pastures;  Location: [facility];  Service: Gynecology;  Laterality: N/A;   BREAST SURGERY     left breast lumpectomy 1977   COLONOSCOPY N/A 03/03/2016   Procedure: COLONOSCOPY;  Surgeon: Rogene Houston, MD;  [facility];  Service: Endoscopy;  Laterality: N/A;  2:00 - moved to 12/14 @ 12:55 - Ann notified pt   CYSTOCELE REPAIR  12/29/2010   Procedure: ANTERIOR REPAIR (CYSTOCELE);  Surgeon: Daria Pastures;  Location: [facility];  Service: Gynecology;  Laterality: N/A;   CYSTOSCOPY  12/29/2010   Procedure: CYSTOSCOPY;  Surgeon: Daria Pastures;  Location: [facility];  Service:  Gynecology;  Laterality: N/A;   JOINT REPLACEMENT  2011   rt knee   REPLACEMENT TOTAL KNEE Right 2011   TONSILLECTOMY     TOTAL KNEE ARTHROPLASTY Left 11/17/2014   Procedure: LEFT TOTAL KNEE ARTHROPLASTY;  Surgeon: Gaynelle Arabian, MD;  Location: [facility];  Service: Orthopedics;  Laterality: Left;   TUBAL LIGATION     VAGINAL HYSTERECTOMY  12/29/2010   Procedure: HYSTERECTOMY VAGINAL;  Surgeon: Daria Pastures;  Location: [facility];  Service: Gynecology;  Laterality: N/A;    SOCIAL HISTORY: Social History   Tobacco Use   Smoking status: Never Smoker   Smokeless tobacco: Never Used  Substance Use Topics   Alcohol use: No   Drug use: No    FAMILY HISTORY: Family History  Problem Relation Age of Onset   Stroke Mother    Hypertension Mother    Hyperlipidemia Mother    Thyroid disease Mother    Heart disease Father    COPD Father    Cancer Father    Depression Father    Cancer Paternal Grandmother        breast   Diabetes Paternal Grandfather     ROS: Review of Systems  Constitutional: Negative for weight loss.  Respiratory: Negative for shortness of breath (on exertion).   Cardiovascular: Negative for chest pain.  Gastrointestinal: Negative for nausea and vomiting.  Musculoskeletal:       Negative for muscle weakness  Neurological: Negative for tingling.    PHYSICAL EXAM: Pt in no acute distress  RECENT LABS AND TESTS: BMET    Component Value Date/Time   NA 145 (H) 11/28/2017 1403   K 4.6 11/28/2017 1403   CL 103 11/28/2017 1403   CO2 27 11/28/2017 1403   GLUCOSE 75 11/28/2017 1403   GLUCOSE 109 (H) 11/19/2014 0520   BUN 16 11/28/2017 1403   CREATININE 0.83 11/28/2017 1403   CREATININE 0.89 12/12/2012 0813   CALCIUM 9.6 11/28/2017 1403   GFRNONAA 76 11/28/2017 1403   GFRAA 87 11/28/2017 1403   Lab Results  Component Value Date   HGBA1C 5.4 11/28/2017   HGBA1C 5.2 07/20/2017   HGBA1C 5.4 06/12/2014   HGBA1C 5.4 11/21/2013   HGBA1C  5.4 05/01/2013   Lab Results  Component Value Date   INSULIN 7.8 11/28/2017  INSULIN 12.5 07/20/2017   CBC    Component Value Date/Time   WBC 5.5 11/28/2017 1403   WBC 11.6 (H) 11/19/2014 0520   RBC 4.93 11/28/2017 1403   RBC 3.88 11/19/2014 0520   HGB 14.0 11/28/2017 1403   HCT 43.5 11/28/2017 1403   PLT 201 06/20/2017 0907   MCV 88 11/28/2017 1403   MCH 28.4 11/28/2017 1403   MCH 28.9 11/19/2014 0520   MCHC 32.2 11/28/2017 1403   MCHC 32.7 11/19/2014 0520   RDW 14.8 11/28/2017 1403   LYMPHSABS 1.6 11/28/2017 1403   MONOABS 0.5 11/27/2013 1131   EOSABS 0.1 11/28/2017 1403   BASOSABS 0.0 11/28/2017 1403   Iron/TIBC/Ferritin/ %Sat    Component Value Date/Time   IRON 78 12/01/2016 0819   IRON 18 (L) 06/30/2016 0924   TIBC 315 12/01/2016 0819   TIBC 497 (H) 06/30/2016 0924   FERRITIN 20 07/14/2017 0828   IRONPCTSAT 25 12/01/2016 0819   Lipid Panel     Component Value Date/Time   CHOL 185 07/20/2017 1024   TRIG 113 07/20/2017 1024   HDL 43 07/20/2017 1024   CHOLHDL 4.8 (H) 06/20/2017 0907   CHOLHDL 4.2 11/21/2013 0712   VLDL 18 11/21/2013 0712   LDLCALC 119 (H) 07/20/2017 1024   Hepatic Function Panel     Component Value Date/Time   PROT 6.5 11/28/2017 1403   ALBUMIN 4.2 11/28/2017 1403   AST 17 11/28/2017 1403   ALT 17 11/28/2017 1403   ALKPHOS 105 11/28/2017 1403   BILITOT 0.5 11/28/2017 1403   BILIDIR 0.09 06/20/2017 0907   IBILI 0.3 12/12/2012 0813      Component Value Date/Time   TSH 1.050 11/28/2017 1403   TSH 2.030 07/14/2017 0828   TSH 2.300 01/10/2017 0826     Ref. Range 11/28/2017 14:03  Vitamin D, 25-Hydroxy Latest Ref Range: 30.0 - 100.0 ng/mL 43.4    I, Doreene Nest, am acting as Location manager for [employer]. Owens Shark, DO  I have reviewed the above documentation for accuracy and completeness, and I agree with the above. -Jearld Lesch, DO

## 2018-07-26 LAB — COMPREHENSIVE METABOLIC PANEL
ALT: 17 IU/L (ref 0–32)
AST: 21 IU/L (ref 0–40)
Albumin/Globulin Ratio: 1.8 (ref 1.2–2.2)
Albumin: 4.4 g/dL (ref 3.8–4.8)
Alkaline Phosphatase: 127 IU/L — ABNORMAL HIGH (ref 39–117)
BUN/Creatinine Ratio: 21 (ref 12–28)
BUN: 17 mg/dL (ref 8–27)
Bilirubin Total: 0.4 mg/dL (ref 0.0–1.2)
CO2: 23 mmol/L (ref 20–29)
Calcium: 9.9 mg/dL (ref 8.7–10.3)
Chloride: 102 mmol/L (ref 96–106)
Creatinine, Ser: 0.82 mg/dL (ref 0.57–1.00)
GFR calc Af Amer: 88 mL/min/{1.73_m2} (ref >59)
GFR calc non Af Amer: 76 mL/min/{1.73_m2} (ref >59)
Globulin, Total: 2.4 g/dL (ref 1.5–4.5)
Glucose: 92 mg/dL (ref 65–99)
Potassium: 4.5 mmol/L (ref 3.5–5.2)
Sodium: 138 mmol/L (ref 134–144)
Total Protein: 6.8 g/dL (ref 6.0–8.5)

## 2018-07-26 LAB — LIPID PANEL WITH LDL/HDL RATIO
Cholesterol, Total: 195 mg/dL (ref 100–199)
HDL: 45 mg/dL (ref >39)
LDL Calculated: 135 mg/dL — ABNORMAL HIGH (ref 0–99)
LDl/HDL Ratio: 3 ratio (ref 0.0–3.2)
Triglycerides: 76 mg/dL (ref 0–149)
VLDL Cholesterol Cal: 15 mg/dL (ref 5–40)

## 2018-07-26 LAB — HEMOGLOBIN A1C
Est. average glucose Bld gHb Est-mCnc: 105 mg/dL
Hgb A1c MFr Bld: 5.3 % (ref 4.8–5.6)

## 2018-07-26 LAB — VITAMIN D 25 HYDROXY (VIT D DEFICIENCY, FRACTURES): Vit D, 25-Hydroxy: 26.8 ng/mL — ABNORMAL LOW (ref 30.0–100.0)

## 2018-07-26 LAB — VITAMIN B12: Vitamin B-12: 295 pg/mL (ref 232–1245)

## 2018-07-26 LAB — T3: T3, Total: 166 ng/dL (ref 71–180)

## 2018-07-26 LAB — TSH: TSH: 1.28 u[IU]/mL (ref 0.450–4.500)

## 2018-07-26 LAB — T4, FREE: Free T4: 1.24 ng/dL (ref 0.82–1.77)

## 2018-07-26 LAB — INSULIN, RANDOM: INSULIN: 15.8 u[IU]/mL (ref 2.6–24.9)

## 2018-08-06 ENCOUNTER — Other Ambulatory Visit: Payer: Self-pay

## 2018-08-06 ENCOUNTER — Ambulatory Visit (INDEPENDENT_AMBULATORY_CARE_PROVIDER_SITE_OTHER): Payer: Federal, State, Local not specified - PPO | Admitting: Bariatrics

## 2018-08-06 ENCOUNTER — Encounter (INDEPENDENT_AMBULATORY_CARE_PROVIDER_SITE_OTHER): Payer: Self-pay | Admitting: Bariatrics

## 2018-08-06 DIAGNOSIS — E559 Vitamin D deficiency, unspecified: Secondary | ICD-10-CM | POA: Diagnosis not present

## 2018-08-06 DIAGNOSIS — E8881 Metabolic syndrome: Secondary | ICD-10-CM | POA: Diagnosis not present

## 2018-08-06 DIAGNOSIS — Z6837 Body mass index (BMI) 37.0-37.9, adult: Secondary | ICD-10-CM | POA: Diagnosis not present

## 2018-08-06 MED ORDER — VITAMIN D3 1.25 MG (50000 UT) PO CAPS: 1.0000 | ORAL_CAPSULE | ORAL | 0 refills | Status: DC

## 2018-08-07 NOTE — Progress Notes (Signed)
Office: [phone]  /  Fax: [phone] TeleHealth Visit:  Karen Vazquez has verbally consented to this TeleHealth visit today. The patient is located at home, the provider is located at the [employer] [facility] office. The participants in this visit include the listed provider and patient and any and all parties involved. The visit was conducted today via FaceTime.  HPI:   Chief Complaint: OBESITY Karen Vazquez is here to discuss her progress with her obesity treatment plan. She is on the Category 2 plan and is following her eating plan approximately 80 % of the time. She states she is walking 30 minutes 3 times per week. Karen Vazquez states that she has lost 3 pounds. She has done well overall. We were unable to weigh the patient today for this TeleHealth visit. She feels as if she has lost weight since her last visit. She has lost 4 lbs since starting treatment with [place].  Vitamin D deficiency Karen Vazquez has a diagnosis of vitamin D deficiency. She is currently taking vit D and denies nausea, vomiting or muscle weakness.  Insulin Resistance Karen Vazquez has a diagnosis of insulin resistance based on her elevated fasting insulin level >5. Although Karen Vazquez's blood glucose readings are still under good control, insulin resistance puts her at greater risk of metabolic syndrome and diabetes. Her last A1c was at 5.3 and last insulin level was at 15.8 She is not taking medications currently and continues to work on diet and exercise to decrease risk of diabetes.  ASSESSMENT AND PLAN:  Vitamin D deficiency - Plan: Cholecalciferol (VITAMIN D3) 1.25 MG (50000 UT) CAPS  Insulin resistance  Class 2 severe obesity with serious comorbidity and body mass index (BMI) of 37.0 to 37.9 in adult, unspecified obesity type ([place])  PLAN:  Vitamin D Deficiency Karen Vazquez was informed that low vitamin D levels contributes to fatigue and are associated with obesity, breast, and colon cancer. She agrees to change prescription Vit D  from D2 to D3 @50 ,000 IU every week #4 with no refills and will follow up for routine testing of vitamin D, at least 2-3 times per year. She was informed of the risk of over-replacement of vitamin D and agrees to not increase her dose unless she discusses this with [place] first. Karen Vazquez agrees to follow up with our clinic in 2 weeks.  Insulin Resistance Karen Vazquez will continue to work on weight loss, exercise, increasing lean protein and decreasing simple carbohydrates in her diet to help decrease the risk of diabetes. She was informed that eating too many simple carbohydrates or too many calories at one sitting increases the likelihood of GI side effects. Karen Vazquez agreed to follow up with [place] as directed to monitor her progress.  Obesity Karen Vazquez is currently in the action stage of change. As such, her goal is to continue with weight loss efforts She has agreed to keep a food journal with 400 to 500 calories and 35 grams of protein at supper daily and follow the Category 2 plan Karen Vazquez will continue her exercise regimen for weight loss and overall health benefits. We discussed the following Behavioral Modification Strategies today: increase H2O intake, no skipping meals, keeping healthy foods in the home, increasing lean protein intake, decreasing simple carbohydrates, increasing vegetables, decrease eating out, work on meal planning and easy cooking plans and ways to avoid boredom eating  Karen Vazquez has agreed to follow up with our clinic in 2 weeks. She was informed of the importance of frequent follow up visits to maximize her success with  intensive lifestyle modifications for her multiple health conditions.  ALLERGIES: Allergies  Allergen Reactions  . Lisinopril Cough    MEDICATIONS: Current Outpatient Medications on File Prior to Visit  Medication Sig Dispense Refill  . albuterol (PROVENTIL HFA;VENTOLIN HFA) 108 (90 Base) MCG/ACT inhaler Inhale 2 puffs into the lungs every 6 (six) hours as needed for wheezing  or shortness of breath. 1 Inhaler 2  . ALPRAZolam (XANAX) 0.5 MG tablet TAKE ONE TABLET BY MOUTH TWO TIMES DAILYAS NEEDED FOR ANXIETY OR SLEEP. 30 tablet 5  . amLODipine (NORVASC) 2.5 MG tablet Take 1 tablet (2.5 mg total) by mouth daily. 30 tablet 5  . ARMOUR THYROID 15 MG tablet TAKE ONE (1) TABLET EACH DAY 30 tablet 5  . benzonatate (TESSALON) 100 MG capsule Take 1 capsule (100 mg total) by mouth 2 (two) times daily as needed for cough. 20 capsule 0  . meloxicam (MOBIC) 15 MG tablet Take 1 tablet (15 mg total) by mouth daily. 30 tablet 0  . oxybutynin (DITROPAN XL) 5 MG 24 hr tablet Take 1 tablet (5 mg total) by mouth daily as needed. 30 tablet 5  . oxyCODONE-acetaminophen (PERCOCET/ROXICET) 5-325 MG tablet Take 1-2 tablets by mouth every 6 (six) hours as needed for severe pain. 10 tablet 0  . Pediatric Multiple Vit-C-FA (FLINSTONES GUMMIES OMEGA-3 DHA PO) Take 1 tablet by mouth daily.    Marland Kitchen rOPINIRole (REQUIP) 4 MG tablet Take 1 tablet (4 mg total) by mouth every evening. 30 tablet 5  . scopolamine (TRANSDERM-SCOP, 1.5 MG,) 1 MG/3DAYS Place one behind ear every 3 days 3 patch 2  . thyroid (ARMOUR THYROID) 30 MG tablet TAKE ONE (1) TABLET BY MOUTH EVERY DAY 30 tablet 5  . zolpidem (AMBIEN) 10 MG tablet Take 1 tablet (10 mg total) by mouth at bedtime as needed for sleep. 90 tablet 1   No current facility-administered medications on file prior to visit.     PAST MEDICAL HISTORY: Past Medical History:  Diagnosis Date  . Anemia   . Anxiety   . Arthritis   . Back pain   . Blood transfusion 2011   at [facility]  . Bulging lumbar disc   . Chest pain   . Depression   . Dry mouth   . Easy bruising   . Excessive thirst   . Fatigue   . Frequent urination   . GERD (gastroesophageal reflux disease)   . Headache   . Heat intolerance   . Hypertension   . Hypothyroidism   . Joint pain   . Leg pain   . Muscle stiffness   . Nervousness   . Palpitations   . Pre-diabetes   . Restless leg syndrome    . Shortness of breath on exertion   . Sleep apnea    does not use c-pap machine  . Stomach ulcer   . Stress   . Swelling of both lower extremities   . Trouble in sleeping   . Vitamin D deficiency   . Weakness     PAST SURGICAL HISTORY: Past Surgical History:  Procedure Laterality Date  . APPENDECTOMY    . BLADDER SUSPENSION  12/29/2010   Procedure: TRANSVAGINAL TAPE (TVT) PROCEDURE;  Surgeon: Daria Pastures;  Location: [facility];  Service: Gynecology;  Laterality: N/A;  . BREAST SURGERY     left breast lumpectomy 1977  . COLONOSCOPY N/A 03/03/2016   Procedure: COLONOSCOPY;  Surgeon: Rogene Houston, MD;  [facility];  Service: Endoscopy;  Laterality: N/A;  2:00 - moved to 12/14 @ 12:55 - Ann notified pt  . CYSTOCELE REPAIR  12/29/2010   Procedure: ANTERIOR REPAIR (CYSTOCELE);  Surgeon: Daria Pastures;  Location: [facility];  Service: Gynecology;  Laterality: N/A;  . CYSTOSCOPY  12/29/2010   Procedure: CYSTOSCOPY;  Surgeon: Daria Pastures;  Location: [facility];  Service: Gynecology;  Laterality: N/A;  . JOINT REPLACEMENT  2011   rt knee  . REPLACEMENT TOTAL KNEE Right 2011  . TONSILLECTOMY    . TOTAL KNEE ARTHROPLASTY Left 11/17/2014   Procedure: LEFT TOTAL KNEE ARTHROPLASTY;  Surgeon: Gaynelle Arabian, MD;  Location: [facility];  Service: Orthopedics;  Laterality: Left;  . TUBAL LIGATION    . VAGINAL HYSTERECTOMY  12/29/2010   Procedure: HYSTERECTOMY VAGINAL;  Surgeon: Daria Pastures;  Location: [facility];  Service: Gynecology;  Laterality: N/A;    SOCIAL HISTORY: Social History   Tobacco Use  . Smoking status: Never Smoker  . Smokeless tobacco: Never Used  Substance Use Topics  . Alcohol use: No  . Drug use: No    FAMILY HISTORY: Family History  Problem Relation Age of Onset  . Stroke Mother   . Hypertension Mother   . Hyperlipidemia Mother   . Thyroid disease Mother   . Heart disease Father   . COPD Father   . Cancer Father   . Depression Father    . Cancer Paternal Grandmother        breast  . Diabetes Paternal Grandfather     ROS: Review of Systems  Constitutional: Positive for weight loss.  Gastrointestinal: Negative for nausea and vomiting.  Musculoskeletal:       Negative for muscle weakness    PHYSICAL EXAM: Pt in no acute distress  RECENT LABS AND TESTS: BMET    Component Value Date/Time   NA 138 07/24/2018 1059   K 4.5 07/24/2018 1059   CL 102 07/24/2018 1059   CO2 23 07/24/2018 1059   GLUCOSE 92 07/24/2018 1059   GLUCOSE 109 (H) 11/19/2014 0520   BUN 17 07/24/2018 1059   CREATININE 0.82 07/24/2018 1059   CREATININE 0.89 12/12/2012 0813   CALCIUM 9.9 07/24/2018 1059   GFRNONAA 76 07/24/2018 1059   GFRAA 88 07/24/2018 1059   Lab Results  Component Value Date   HGBA1C 5.3 07/24/2018   HGBA1C 5.4 11/28/2017   HGBA1C 5.2 07/20/2017   HGBA1C 5.4 06/12/2014   HGBA1C 5.4 11/21/2013   Lab Results  Component Value Date   INSULIN 15.8 07/24/2018   INSULIN 7.8 11/28/2017   INSULIN 12.5 07/20/2017   CBC    Component Value Date/Time   WBC 5.5 11/28/2017 1403   WBC 11.6 (H) 11/19/2014 0520   RBC 4.93 11/28/2017 1403   RBC 3.88 11/19/2014 0520   HGB 14.0 11/28/2017 1403   HCT 43.5 11/28/2017 1403   PLT 201 06/20/2017 0907   MCV 88 11/28/2017 1403   MCH 28.4 11/28/2017 1403   MCH 28.9 11/19/2014 0520   MCHC 32.2 11/28/2017 1403   MCHC 32.7 11/19/2014 0520   RDW 14.8 11/28/2017 1403   LYMPHSABS 1.6 11/28/2017 1403   MONOABS 0.5 11/27/2013 1131   EOSABS 0.1 11/28/2017 1403   BASOSABS 0.0 11/28/2017 1403   Iron/TIBC/Ferritin/ %Sat    Component Value Date/Time   IRON 78 12/01/2016 0819   IRON 18 (L) 06/30/2016 0924   TIBC 315 12/01/2016 0819   TIBC 497 (H) 06/30/2016 0924   FERRITIN 20 07/14/2017 0828   IRONPCTSAT  25 12/01/2016 0819   Lipid Panel     Component Value Date/Time   CHOL 195 07/24/2018 1059   TRIG 76 07/24/2018 1059   HDL 45 07/24/2018 1059   CHOLHDL 4.8 (H) 06/20/2017 0907    CHOLHDL 4.2 11/21/2013 0712   VLDL 18 11/21/2013 0712   LDLCALC 135 (H) 07/24/2018 1059   Hepatic Function Panel     Component Value Date/Time   PROT 6.8 07/24/2018 1059   ALBUMIN 4.4 07/24/2018 1059   AST 21 07/24/2018 1059   ALT 17 07/24/2018 1059   ALKPHOS 127 (H) 07/24/2018 1059   BILITOT 0.4 07/24/2018 1059   BILIDIR 0.09 06/20/2017 0907   IBILI 0.3 12/12/2012 0813      Component Value Date/Time   TSH 1.280 07/24/2018 1059   TSH 1.050 11/28/2017 1403   TSH 2.030 07/14/2017 0828     Ref. Range 07/24/2018 10:59  Vitamin D, 25-Hydroxy Latest Ref Range: 30.0 - 100.0 ng/mL 26.8 (L)    I, Doreene Nest, am acting as Location manager for [employer]. Owens Shark, DO  I have reviewed the above documentation for accuracy and completeness, and I agree with the above. -Jearld Lesch, DO

## 2018-08-20 ENCOUNTER — Encounter (INDEPENDENT_AMBULATORY_CARE_PROVIDER_SITE_OTHER): Payer: Self-pay | Admitting: Bariatrics

## 2018-08-20 ENCOUNTER — Other Ambulatory Visit: Payer: Self-pay

## 2018-08-20 ENCOUNTER — Ambulatory Visit (INDEPENDENT_AMBULATORY_CARE_PROVIDER_SITE_OTHER): Payer: Federal, State, Local not specified - PPO | Admitting: Bariatrics

## 2018-08-20 DIAGNOSIS — Z6837 Body mass index (BMI) 37.0-37.9, adult: Secondary | ICD-10-CM | POA: Diagnosis not present

## 2018-08-20 DIAGNOSIS — E559 Vitamin D deficiency, unspecified: Secondary | ICD-10-CM

## 2018-08-20 DIAGNOSIS — E8881 Metabolic syndrome: Secondary | ICD-10-CM | POA: Diagnosis not present

## 2018-08-20 DIAGNOSIS — E66812 Obesity, class 2: Secondary | ICD-10-CM

## 2018-08-20 DIAGNOSIS — E88819 Insulin resistance, unspecified: Secondary | ICD-10-CM

## 2018-08-21 NOTE — Progress Notes (Signed)
Office: [phone]  /  Fax: [phone] TeleHealth Visit:  Karen Vazquez has verbally consented to this TeleHealth visit today. The patient is located at home, the provider is located at the [facility] office. The participants in this visit include the listed provider and patient and any and all parties involved. The visit was conducted today via FaceTime.  HPI:   Chief Complaint: OBESITY Karen Vazquez is here to discuss her progress with her obesity treatment plan. She is on the Category 2 plan and is following her eating plan approximately 45 % of the time. She states she is biking 30 minutes 3 times per week. Karen Vazquez states that she has gained 2 pounds (weight 237 pounds). She has been stressed over the last two weeks. We were unable to weigh the patient today for this TeleHealth visit. She feels as if she has gained weight since her last visit. She has lost 2 lbs since starting treatment with [place].  Insulin Resistance Terie has a diagnosis of insulin resistance based on her elevated fasting insulin level >5. Although Karen Vazquez's blood glucose readings are still under good control, insulin resistance puts her at greater risk of metabolic syndrome and diabetes. His last A1c was at 5.3 and last insulin level was at 15.8 She is not taking medications currently and continues to work on diet and exercise to decrease risk of diabetes.  Vitamin D deficiency Karen Vazquez has a diagnosis of vitamin D deficiency. Her last vitamin D level was at 26.8 She is currently taking vit D and denies nausea, vomiting or muscle weakness.  ASSESSMENT AND PLAN:  Insulin resistance  Vitamin D deficiency  Class 2 severe obesity with serious comorbidity and body mass index (BMI) of 37.0 to 37.9 in adult, unspecified obesity type ([place])  PLAN:  Insulin Resistance Karen Vazquez will continue to work on weight loss, exercise, increasing lean protein and decreasing simple carbohydrates in her diet to help decrease the risk  of diabetes. She was informed that eating too many simple carbohydrates or too many calories at one sitting increases the likelihood of GI side effects. Analy agreed to follow up with [place] as directed to monitor her progress.  Vitamin D Deficiency Karen Vazquez was informed that low vitamin D levels contributes to fatigue and are associated with obesity, breast, and colon cancer. She agrees to continue to take prescription Vit D @50 ,000 IU every week and will follow up for routine testing of vitamin D, at least 2-3 times per year. She was informed of the risk of over-replacement of vitamin D and agrees to not increase her dose unless she discusses this with [place] first.  Obesity Karen Vazquez is currently in the action stage of change. As such, her goal is to continue with weight loss efforts She has agreed to follow the Category 2 plan Karen Vazquez has been instructed to work up to a goal of 150 minutes of combined cardio and strengthening exercise per week for weight loss and overall health benefits. We discussed the following Behavioral Modification Strategies today: increase H2O intake, no skipping meals, keeping healthy foods in the home, increasing lean protein intake, decreasing simple carbohydrates, increasing vegetables, decrease eating out and work on meal planning and easy cooking plans Karen Vazquez will continue to weigh herself at home and record.  Karen Vazquez has agreed to follow up with our clinic in 2 weeks. She was informed of the importance of frequent follow up visits to maximize her success with intensive lifestyle modifications for her multiple health conditions.  ALLERGIES:  Allergies  Allergen Reactions  . Lisinopril Cough    MEDICATIONS: Current Outpatient Medications on File Prior to Visit  Medication Sig Dispense Refill  . albuterol (PROVENTIL HFA;VENTOLIN HFA) 108 (90 Base) MCG/ACT inhaler Inhale 2 puffs into the lungs every 6 (six) hours as needed for wheezing or shortness of breath. 1 Inhaler 2  .  ALPRAZolam (XANAX) 0.5 MG tablet TAKE ONE TABLET BY MOUTH TWO TIMES DAILYAS NEEDED FOR ANXIETY OR SLEEP. 30 tablet 5  . amLODipine (NORVASC) 2.5 MG tablet Take 1 tablet (2.5 mg total) by mouth daily. 30 tablet 5  . ARMOUR THYROID 15 MG tablet TAKE ONE (1) TABLET EACH DAY 30 tablet 5  . benzonatate (TESSALON) 100 MG capsule Take 1 capsule (100 mg total) by mouth 2 (two) times daily as needed for cough. 20 capsule 0  . Cholecalciferol (VITAMIN D3) 1.25 MG (50000 UT) CAPS Take 1 Dose by mouth once a week. 4 capsule 0  . meloxicam (MOBIC) 15 MG tablet Take 1 tablet (15 mg total) by mouth daily. 30 tablet 0  . oxybutynin (DITROPAN XL) 5 MG 24 hr tablet Take 1 tablet (5 mg total) by mouth daily as needed. 30 tablet 5  . oxyCODONE-acetaminophen (PERCOCET/ROXICET) 5-325 MG tablet Take 1-2 tablets by mouth every 6 (six) hours as needed for severe pain. 10 tablet 0  . Pediatric Multiple Vit-C-FA (FLINSTONES GUMMIES OMEGA-3 DHA PO) Take 1 tablet by mouth daily.    Marland Kitchen rOPINIRole (REQUIP) 4 MG tablet Take 1 tablet (4 mg total) by mouth every evening. 30 tablet 5  . scopolamine (TRANSDERM-SCOP, 1.5 MG,) 1 MG/3DAYS Place one behind ear every 3 days 3 patch 2  . thyroid (ARMOUR THYROID) 30 MG tablet TAKE ONE (1) TABLET BY MOUTH EVERY DAY 30 tablet 5  . zolpidem (AMBIEN) 10 MG tablet Take 1 tablet (10 mg total) by mouth at bedtime as needed for sleep. 90 tablet 1   No current facility-administered medications on file prior to visit.     PAST MEDICAL HISTORY: Past Medical History:  Diagnosis Date  . Anemia   . Anxiety   . Arthritis   . Back pain   . Blood transfusion 2011   at [facility]  . Bulging lumbar disc   . Chest pain   . Depression   . Dry mouth   . Easy bruising   . Excessive thirst   . Fatigue   . Frequent urination   . GERD (gastroesophageal reflux disease)   . Headache   . Heat intolerance   . Hypertension   . Hypothyroidism   . Joint pain   . Leg pain   . Muscle stiffness   . Nervousness    . Palpitations   . Pre-diabetes   . Restless leg syndrome   . Shortness of breath on exertion   . Sleep apnea    does not use c-pap machine  . Stomach ulcer   . Stress   . Swelling of both lower extremities   . Trouble in sleeping   . Vitamin D deficiency   . Weakness     PAST SURGICAL HISTORY: Past Surgical History:  Procedure Laterality Date  . APPENDECTOMY    . BLADDER SUSPENSION  12/29/2010   Procedure: TRANSVAGINAL TAPE (TVT) PROCEDURE;  Surgeon: Daria Pastures;  Location: [facility];  Service: Gynecology;  Laterality: N/A;  . BREAST SURGERY     left breast lumpectomy 1977  . COLONOSCOPY N/A 03/03/2016   Procedure: COLONOSCOPY;  Surgeon: Mechele Dawley  Laural Golden, MD;  [facility];  Service: Endoscopy;  Laterality: N/A;  2:00 - moved to 12/14 @ 12:55 - Ann notified pt  . CYSTOCELE REPAIR  12/29/2010   Procedure: ANTERIOR REPAIR (CYSTOCELE);  Surgeon: Daria Pastures;  Location: [place] [facility];  Service: Gynecology;  Laterality: N/A;  . CYSTOSCOPY  12/29/2010   Procedure: CYSTOSCOPY;  Surgeon: Daria Pastures;  Location: [facility];  Service: Gynecology;  Laterality: N/A;  . JOINT REPLACEMENT  2011   rt knee  . REPLACEMENT TOTAL KNEE Right 2011  . TONSILLECTOMY    . TOTAL KNEE ARTHROPLASTY Left 11/17/2014   Procedure: LEFT TOTAL KNEE ARTHROPLASTY;  Surgeon: Gaynelle Arabian, MD;  Location: [facility];  Service: Orthopedics;  Laterality: Left;  . TUBAL LIGATION    . VAGINAL HYSTERECTOMY  12/29/2010   Procedure: HYSTERECTOMY VAGINAL;  Surgeon: Daria Pastures;  Location: [facility] [facility];  Service: Gynecology;  Laterality: N/A;    SOCIAL HISTORY: Social History   Tobacco Use  . Smoking status: Never Smoker  . Smokeless tobacco: Never Used  Substance Use Topics  . Alcohol use: No  . Drug use: No    FAMILY HISTORY: Family History  Problem Relation Age of Onset  . Stroke Mother   . Hypertension Mother   . Hyperlipidemia Mother   . Thyroid disease Mother   . Heart disease  Father   . COPD Father   . Cancer Father   . Depression Father   . Cancer Paternal Grandmother        breast  . Diabetes Paternal Grandfather     ROS: Review of Systems  Constitutional: Negative for weight loss.  Gastrointestinal: Negative for nausea and vomiting.  Musculoskeletal:       Negative for muscle weakness    PHYSICAL EXAM: Pt in no acute distress  RECENT LABS AND TESTS: BMET    Component Value Date/Time   NA 138 07/24/2018 1059   K 4.5 07/24/2018 1059   CL 102 07/24/2018 1059   CO2 23 07/24/2018 1059   GLUCOSE 92 07/24/2018 1059   GLUCOSE 109 (H) 11/19/2014 0520   BUN 17 07/24/2018 1059   CREATININE 0.82 07/24/2018 1059   CREATININE 0.89 12/12/2012 0813   CALCIUM 9.9 07/24/2018 1059   GFRNONAA 76 07/24/2018 1059   GFRAA 88 07/24/2018 1059   Lab Results  Component Value Date   HGBA1C 5.3 07/24/2018   HGBA1C 5.4 11/28/2017   HGBA1C 5.2 07/20/2017   HGBA1C 5.4 06/12/2014   HGBA1C 5.4 11/21/2013   Lab Results  Component Value Date   INSULIN 15.8 07/24/2018   INSULIN 7.8 11/28/2017   INSULIN 12.5 07/20/2017   CBC    Component Value Date/Time   WBC 5.5 11/28/2017 1403   WBC 11.6 (H) 11/19/2014 0520   RBC 4.93 11/28/2017 1403   RBC 3.88 11/19/2014 0520   HGB 14.0 11/28/2017 1403   HCT 43.5 11/28/2017 1403   PLT 201 06/20/2017 0907   MCV 88 11/28/2017 1403   MCH 28.4 11/28/2017 1403   MCH 28.9 11/19/2014 0520   MCHC 32.2 11/28/2017 1403   MCHC 32.7 11/19/2014 0520   RDW 14.8 11/28/2017 1403   LYMPHSABS 1.6 11/28/2017 1403   MONOABS 0.5 11/27/2013 1131   EOSABS 0.1 11/28/2017 1403   BASOSABS 0.0 11/28/2017 1403   Iron/TIBC/Ferritin/ %Sat    Component Value Date/Time   IRON 78 12/01/2016 0819   IRON 18 (L) 06/30/2016 0924   TIBC 315 12/01/2016 0819   TIBC 497 (H)  06/30/2016 0924   FERRITIN 20 07/14/2017 0828   IRONPCTSAT 25 12/01/2016 0819   Lipid Panel     Component Value Date/Time   CHOL 195 07/24/2018 1059   TRIG 76 07/24/2018  1059   HDL 45 07/24/2018 1059   CHOLHDL 4.8 (H) 06/20/2017 0907   CHOLHDL 4.2 11/21/2013 0712   VLDL 18 11/21/2013 0712   LDLCALC 135 (H) 07/24/2018 1059   Hepatic Function Panel     Component Value Date/Time   PROT 6.8 07/24/2018 1059   ALBUMIN 4.4 07/24/2018 1059   AST 21 07/24/2018 1059   ALT 17 07/24/2018 1059   ALKPHOS 127 (H) 07/24/2018 1059   BILITOT 0.4 07/24/2018 1059   BILIDIR 0.09 06/20/2017 0907   IBILI 0.3 12/12/2012 0813      Component Value Date/Time   TSH 1.280 07/24/2018 1059   TSH 1.050 11/28/2017 1403   TSH 2.030 07/14/2017 0828     Ref. Range 07/24/2018 10:59  Vitamin D, 25-Hydroxy Latest Ref Range: 30.0 - 100.0 ng/mL 26.8 (L)    I, Doreene Nest, am acting as Location manager for [employer]. Owens Shark, DO

## 2018-08-27 ENCOUNTER — Encounter (INDEPENDENT_AMBULATORY_CARE_PROVIDER_SITE_OTHER): Payer: Self-pay | Admitting: Bariatrics

## 2018-08-27 NOTE — Telephone Encounter (Signed)
Please reschedule

## 2018-09-03 ENCOUNTER — Encounter: Payer: Self-pay | Admitting: Family Medicine

## 2018-09-03 ENCOUNTER — Ambulatory Visit (INDEPENDENT_AMBULATORY_CARE_PROVIDER_SITE_OTHER): Payer: Federal, State, Local not specified - PPO | Admitting: Bariatrics

## 2018-09-03 DIAGNOSIS — M2550 Pain in unspecified joint: Secondary | ICD-10-CM

## 2018-09-09 NOTE — Telephone Encounter (Signed)
Let the patient know I looked over her message I also looked over the lab work that Dr. Owens Shark did I would recommend additional lab work to look for inflammatory issues I recommend ANA, rheumatoid factor, sed rate, C-reactive protein, CK level Await to see the findings of these tests

## 2018-09-10 NOTE — Telephone Encounter (Signed)
Nurses-please see additional documentation that was completed late last week regarding this patient regarding labs etc.

## 2018-09-11 ENCOUNTER — Encounter (INDEPENDENT_AMBULATORY_CARE_PROVIDER_SITE_OTHER): Payer: Self-pay

## 2018-09-11 DIAGNOSIS — M2550 Pain in unspecified joint: Secondary | ICD-10-CM | POA: Diagnosis not present

## 2018-09-11 NOTE — Addendum Note (Signed)
Addended by: Dairl Ponder on: 09/11/2018 08:37 AM   Modules accepted: Orders

## 2018-09-12 ENCOUNTER — Encounter (INDEPENDENT_AMBULATORY_CARE_PROVIDER_SITE_OTHER): Payer: Self-pay | Admitting: Bariatrics

## 2018-09-12 ENCOUNTER — Other Ambulatory Visit: Payer: Self-pay

## 2018-09-12 ENCOUNTER — Ambulatory Visit (INDEPENDENT_AMBULATORY_CARE_PROVIDER_SITE_OTHER): Payer: Federal, State, Local not specified - PPO | Admitting: Bariatrics

## 2018-09-12 VITALS — BP 133/72 | HR 69 | Temp 97.7°F | Ht 64.0 in | Wt 235.0 lb

## 2018-09-12 DIAGNOSIS — E559 Vitamin D deficiency, unspecified: Secondary | ICD-10-CM | POA: Diagnosis not present

## 2018-09-12 DIAGNOSIS — Z9189 Other specified personal risk factors, not elsewhere classified: Secondary | ICD-10-CM | POA: Diagnosis not present

## 2018-09-12 DIAGNOSIS — E8881 Metabolic syndrome: Secondary | ICD-10-CM | POA: Diagnosis not present

## 2018-09-12 DIAGNOSIS — Z6841 Body Mass Index (BMI) 40.0 and over, adult: Secondary | ICD-10-CM

## 2018-09-12 MED ORDER — VITAMIN D3 1.25 MG (50000 UT) PO CAPS: 1.0000 | ORAL_CAPSULE | ORAL | 0 refills | Status: DC

## 2018-09-13 LAB — C-REACTIVE PROTEIN: CRP: 3 mg/L (ref 0–10)

## 2018-09-13 LAB — CK: Total CK: 76 U/L (ref 32–182)

## 2018-09-13 LAB — RHEUMATOID FACTOR: Rhuematoid fact SerPl-aCnc: <10 IU/mL (ref 0.0–13.9)

## 2018-09-13 LAB — ANA: ANA Titer 1: NEGATIVE

## 2018-09-13 LAB — SEDIMENTATION RATE: Sed Rate: 12 mm/hr (ref 0–40)

## 2018-09-13 NOTE — Progress Notes (Signed)
Office: [phone]  /  Fax: [phone]   HPI:   Chief Complaint: OBESITY Karen Vazquez is here to discuss her progress with her obesity treatment plan. She is on the Category 2 plan and is following her eating plan approximately 40 to 50 % of the time. She states she is doing yard work 3 to 4 minutes 2 times per week. Antonietta has not been seen since November 2019. She has gained 16 pounds. Her weight is 235 lb (106.6 kg) today and has had a weight gain of 16 pounds since her last in-office visit. She has lost 4 lbs since starting treatment with [place].  Vitamin D deficiency Karen Vazquez has a diagnosis of vitamin D deficiency. Her last vitamin D level was at 22.0 Karen Vazquez is currently taking vit D and she denies nausea, vomiting or muscle weakness.  At risk for osteopenia and osteoporosis Karen Vazquez is at higher risk of osteopenia and osteoporosis due to vitamin D deficiency.   Insulin Resistance Karen Vazquez has a diagnosis of insulin resistance based on her elevated fasting insulin level >5. Although Karen Vazquez's blood glucose readings are still under good control, insulin resistance puts her at greater risk of metabolic syndrome and diabetes. Her last A1c was at 5.3 and last insulin level was at 15.8 She is not taking metformin currently and continues to work on diet and exercise to decrease risk of diabetes.  ASSESSMENT AND PLAN:  Vitamin D deficiency - Plan: Cholecalciferol (VITAMIN D3) 1.25 MG (50000 UT) CAPS  Insulin resistance  At risk for osteoporosis  Class 3 severe obesity without serious comorbidity with body mass index (BMI) of 40.0 to 44.9 in adult, unspecified obesity type ([place])  PLAN:  Vitamin D Deficiency Karen Vazquez was informed that low vitamin D levels contributes to fatigue and are associated with obesity, breast, and colon cancer. She agrees to continue to take prescription Vit D @50 ,000 IU every week #4 with no refills and will follow up for routine testing of vitamin D, at least 2-3 times per year.  She was informed of the risk of over-replacement of vitamin D and agrees to not increase her dose unless she discusses this with [place] first. Vaneta agrees to follow up as directed.  At risk for osteopenia and osteoporosis Karen Vazquez was given extended  (15 minutes) osteoporosis prevention counseling today. Karen Vazquez is at risk for osteopenia and osteoporosis due to her vitamin D deficiency. She was encouraged to take her vitamin D and follow her higher calcium diet and increase strengthening exercise to help strengthen her bones and decrease her risk of osteopenia and osteoporosis.  Insulin Resistance Karen Vazquez will continue to work on weight loss, exercise, increasing lean protein and decreasing simple carbohydrates in her diet to help decrease the risk of diabetes. She was informed that eating too many simple carbohydrates or too many calories at one sitting increases the likelihood of GI side effects. Karen Vazquez agreed to follow up with [place] as directed to monitor her progress.  Obesity Karen Vazquez is currently in the action stage of change. As such, her goal is to continue with weight loss efforts She has agreed to follow the Category 2 plan Karen Vazquez will remain active for weight loss and overall health benefits. We discussed the following Behavioral Modification Strategies today: increase H2O intake, no skipping meals, keeping healthy foods in the home, increasing lean protein intake, decreasing simple carbohydrates, increasing vegetables, decrease eating out and work on meal planning and intentional eating Handouts for dinner, additional Category 1 and Category 2 dinner options and  additional lunch options were given to patient today  Karen Vazquez has agreed to follow up with our clinic in 2 weeks. She was informed of the importance of frequent follow up visits to maximize her success with intensive lifestyle modifications for her multiple health conditions.  ALLERGIES: Allergies  Allergen Reactions  . Lisinopril Cough     MEDICATIONS: Current Outpatient Medications on File Prior to Visit  Medication Sig Dispense Refill  . albuterol (PROVENTIL HFA;VENTOLIN HFA) 108 (90 Base) MCG/ACT inhaler Inhale 2 puffs into the lungs every 6 (six) hours as needed for wheezing or shortness of breath. 1 Inhaler 2  . ALPRAZolam (XANAX) 0.5 MG tablet TAKE ONE TABLET BY MOUTH TWO TIMES DAILYAS NEEDED FOR ANXIETY OR SLEEP. 30 tablet 5  . amLODipine (NORVASC) 2.5 MG tablet Take 1 tablet (2.5 mg total) by mouth daily. 30 tablet 5  . ARMOUR THYROID 15 MG tablet TAKE ONE (1) TABLET EACH DAY 30 tablet 5  . benzonatate (TESSALON) 100 MG capsule Take 1 capsule (100 mg total) by mouth 2 (two) times daily as needed for cough. 20 capsule 0  . meloxicam (MOBIC) 15 MG tablet Take 1 tablet (15 mg total) by mouth daily. 30 tablet 0  . oxybutynin (DITROPAN XL) 5 MG 24 hr tablet Take 1 tablet (5 mg total) by mouth daily as needed. 30 tablet 5  . oxyCODONE-acetaminophen (PERCOCET/ROXICET) 5-325 MG tablet Take 1-2 tablets by mouth every 6 (six) hours as needed for severe pain. 10 tablet 0  . Pediatric Multiple Vit-C-FA (FLINSTONES GUMMIES OMEGA-3 DHA PO) Take 1 tablet by mouth daily.    Marland Kitchen rOPINIRole (REQUIP) 4 MG tablet Take 1 tablet (4 mg total) by mouth every evening. 30 tablet 5  . scopolamine (TRANSDERM-SCOP, 1.5 MG,) 1 MG/3DAYS Place one behind ear every 3 days 3 patch 2  . thyroid (ARMOUR THYROID) 30 MG tablet TAKE ONE (1) TABLET BY MOUTH EVERY DAY 30 tablet 5  . zolpidem (AMBIEN) 10 MG tablet Take 1 tablet (10 mg total) by mouth at bedtime as needed for sleep. 90 tablet 1   No current facility-administered medications on file prior to visit.     PAST MEDICAL HISTORY: Past Medical History:  Diagnosis Date  . Anemia   . Anxiety   . Arthritis   . Back pain   . Blood transfusion 2011   at [facility]  . Bulging lumbar disc   . Chest pain   . Depression   . Dry mouth   . Easy bruising   . Excessive thirst   . Fatigue   . Frequent urination    . GERD (gastroesophageal reflux disease)   . Headache   . Heat intolerance   . Hypertension   . Hypothyroidism   . Joint pain   . Leg pain   . Muscle stiffness   . Nervousness   . Palpitations   . Pre-diabetes   . Restless leg syndrome   . Shortness of breath on exertion   . Sleep apnea    does not use c-pap machine  . Stomach ulcer   . Stress   . Swelling of both lower extremities   . Trouble in sleeping   . Vitamin D deficiency   . Weakness     PAST SURGICAL HISTORY: Past Surgical History:  Procedure Laterality Date  . APPENDECTOMY    . BLADDER SUSPENSION  12/29/2010   Procedure: TRANSVAGINAL TAPE (TVT) PROCEDURE;  Surgeon: Daria Pastures;  Location: [place] [facility];  Service: Gynecology;  Laterality:  N/A;  . BREAST SURGERY     left breast lumpectomy 1977  . COLONOSCOPY N/A 03/03/2016   Procedure: COLONOSCOPY;  Surgeon: Rogene Houston, MD;  [facility];  Service: Endoscopy;  Laterality: N/A;  2:00 - moved to 12/14 @ 12:55 - Ann notified pt  . CYSTOCELE REPAIR  12/29/2010   Procedure: ANTERIOR REPAIR (CYSTOCELE);  Surgeon: Daria Pastures;  Location: [facility];  Service: Gynecology;  Laterality: N/A;  . CYSTOSCOPY  12/29/2010   Procedure: CYSTOSCOPY;  Surgeon: Daria Pastures;  Location: [facility];  Service: Gynecology;  Laterality: N/A;  . JOINT REPLACEMENT  2011   rt knee  . REPLACEMENT TOTAL KNEE Right 2011  . TONSILLECTOMY    . TOTAL KNEE ARTHROPLASTY Left 11/17/2014   Procedure: LEFT TOTAL KNEE ARTHROPLASTY;  Surgeon: Gaynelle Arabian, MD;  Location: [facility];  Service: Orthopedics;  Laterality: Left;  . TUBAL LIGATION    . VAGINAL HYSTERECTOMY  12/29/2010   Procedure: HYSTERECTOMY VAGINAL;  Surgeon: Daria Pastures;  Location: [facility];  Service: Gynecology;  Laterality: N/A;    SOCIAL HISTORY: Social History   Tobacco Use  . Smoking status: Never Smoker  . Smokeless tobacco: Never Used  Substance Use Topics  . Alcohol use: No  . Drug use: No     FAMILY HISTORY: Family History  Problem Relation Age of Onset  . Stroke Mother   . Hypertension Mother   . Hyperlipidemia Mother   . Thyroid disease Mother   . Heart disease Father   . COPD Father   . Cancer Father   . Depression Father   . Cancer Paternal Grandmother        breast  . Diabetes Paternal Grandfather     ROS: Review of Systems  Constitutional: Negative for weight loss.  Gastrointestinal: Negative for nausea and vomiting.  Musculoskeletal:       Negative for muscle weakness    PHYSICAL EXAM: Blood pressure 133/72, pulse 69, temperature 97.7 F (36.5 C), temperature source Oral, height 5\' 4"  (1.626 m), weight 235 lb (106.6 kg), last menstrual period 05/24/2010, SpO2 96 %. Body mass index is 40.34 kg/m. Physical Exam Vitals signs reviewed.  Constitutional:      Appearance: Normal appearance. She is well-developed. She is obese.  Cardiovascular:     Rate and Rhythm: Normal rate.  Pulmonary:     Effort: Pulmonary effort is normal.  Musculoskeletal: Normal range of motion.  Skin:    General: Skin is warm and dry.  Neurological:     Mental Status: She is alert and oriented to person, place, and time.  Psychiatric:        Mood and Affect: Mood normal.        Behavior: Behavior normal.     RECENT LABS AND TESTS: BMET    Component Value Date/Time   NA 138 07/24/2018 1059   K 4.5 07/24/2018 1059   CL 102 07/24/2018 1059   CO2 23 07/24/2018 1059   GLUCOSE 92 07/24/2018 1059   GLUCOSE 109 (H) 11/19/2014 0520   BUN 17 07/24/2018 1059   CREATININE 0.82 07/24/2018 1059   CREATININE 0.89 12/12/2012 0813   CALCIUM 9.9 07/24/2018 1059   GFRNONAA 76 07/24/2018 1059   GFRAA 88 07/24/2018 1059   Lab Results  Component Value Date   HGBA1C 5.3 07/24/2018   HGBA1C 5.4 11/28/2017   HGBA1C 5.2 07/20/2017   HGBA1C 5.4 06/12/2014   HGBA1C 5.4 11/21/2013   Lab Results  Component Value  Date   INSULIN 15.8 07/24/2018   INSULIN 7.8 11/28/2017   INSULIN 12.5  07/20/2017   CBC    Component Value Date/Time   WBC 5.5 11/28/2017 1403   WBC 11.6 (H) 11/19/2014 0520   RBC 4.93 11/28/2017 1403   RBC 3.88 11/19/2014 0520   HGB 14.0 11/28/2017 1403   HCT 43.5 11/28/2017 1403   PLT 201 06/20/2017 0907   MCV 88 11/28/2017 1403   MCH 28.4 11/28/2017 1403   MCH 28.9 11/19/2014 0520   MCHC 32.2 11/28/2017 1403   MCHC 32.7 11/19/2014 0520   RDW 14.8 11/28/2017 1403   LYMPHSABS 1.6 11/28/2017 1403   MONOABS 0.5 11/27/2013 1131   EOSABS 0.1 11/28/2017 1403   BASOSABS 0.0 11/28/2017 1403   Iron/TIBC/Ferritin/ %Sat    Component Value Date/Time   IRON 78 12/01/2016 0819   IRON 18 (L) 06/30/2016 0924   TIBC 315 12/01/2016 0819   TIBC 497 (H) 06/30/2016 0924   FERRITIN 20 07/14/2017 0828   IRONPCTSAT 25 12/01/2016 0819   Lipid Panel     Component Value Date/Time   CHOL 195 07/24/2018 1059   TRIG 76 07/24/2018 1059   HDL 45 07/24/2018 1059   CHOLHDL 4.8 (H) 06/20/2017 0907   CHOLHDL 4.2 11/21/2013 0712   VLDL 18 11/21/2013 0712   LDLCALC 135 (H) 07/24/2018 1059   Hepatic Function Panel     Component Value Date/Time   PROT 6.8 07/24/2018 1059   ALBUMIN 4.4 07/24/2018 1059   AST 21 07/24/2018 1059   ALT 17 07/24/2018 1059   ALKPHOS 127 (H) 07/24/2018 1059   BILITOT 0.4 07/24/2018 1059   BILIDIR 0.09 06/20/2017 0907   IBILI 0.3 12/12/2012 0813      Component Value Date/Time   TSH 1.280 07/24/2018 1059   TSH 1.050 11/28/2017 1403   TSH 2.030 07/14/2017 0828      Ref. Range 07/24/2018 10:59  Vitamin D, 25-Hydroxy Latest Ref Range: 30.0 - 100.0 ng/mL 26.8 (L)    OBESITY BEHAVIORAL INTERVENTION VISIT  Today's visit was # 15   Starting weight: 239 lbs Starting date: 07/20/17 Today's weight : 235 lbs Today's date: 09/12/2018 Total lbs lost to date: 4   ASK: We discussed the diagnosis of obesity with [name] today and Avamarie agreed to give [place] permission to discuss obesity behavioral modification therapy today.  ASSESS:  Pegge has the diagnosis of obesity and her BMI today is 84.66 [place] is in the action stage of change   ADVISE: Josilynn was educated on the multiple health risks of obesity as well as the benefit of weight loss to improve her health. She was advised of the need for long term treatment and the importance of lifestyle modifications to improve her current health and to decrease her risk of future health problems.  AGREE: Multiple dietary modification options and treatment options were discussed and  Arletha agreed to follow the recommendations documented in the above note.  ARRANGE: Manmeet was educated on the importance of frequent visits to treat obesity as outlined per CMS and USPSTF guidelines and agreed to schedule her next follow up appointment today.  Corey Skains, am acting as Location manager for [employer]. Owens Shark, DO  I have reviewed the above documentation for accuracy and completeness, and I agree with the above. -Jearld Lesch, DO

## 2018-09-27 ENCOUNTER — Ambulatory Visit (INDEPENDENT_AMBULATORY_CARE_PROVIDER_SITE_OTHER): Payer: Federal, State, Local not specified - PPO | Admitting: Bariatrics

## 2018-09-27 ENCOUNTER — Encounter (INDEPENDENT_AMBULATORY_CARE_PROVIDER_SITE_OTHER): Payer: Self-pay | Admitting: Bariatrics

## 2018-09-27 ENCOUNTER — Other Ambulatory Visit: Payer: Self-pay

## 2018-09-27 DIAGNOSIS — Z6841 Body Mass Index (BMI) 40.0 and over, adult: Secondary | ICD-10-CM

## 2018-09-27 DIAGNOSIS — E559 Vitamin D deficiency, unspecified: Secondary | ICD-10-CM

## 2018-09-27 DIAGNOSIS — I10 Essential (primary) hypertension: Secondary | ICD-10-CM

## 2018-09-27 MED ORDER — VITAMIN D3 1.25 MG (50000 UT) PO CAPS: 1.0000 | ORAL_CAPSULE | ORAL | 0 refills | Status: DC

## 2018-10-01 ENCOUNTER — Encounter (INDEPENDENT_AMBULATORY_CARE_PROVIDER_SITE_OTHER): Payer: Self-pay | Admitting: Bariatrics

## 2018-10-01 NOTE — Progress Notes (Signed)
Office: [phone]  /  Fax: [phone] TeleHealth Visit:  Karen Vazquez has verbally consented to this TeleHealth visit today. The patient is located at her daughter's, the provider is located at the [employer] [facility] office. The participants in this visit include the listed provider and patient and any and all parties involved. The visit was conducted today via FaceTime.  HPI:   Chief Complaint: OBESITY Karen Vazquez is here to discuss her progress with her obesity treatment plan. She is on the Category 2 plan and is following her eating plan approximately 50 % of the time. She states she is swimming 2 hours 2 times per week. Karen Vazquez states that she has lost 4 pounds (weight 234 lbs). She had been on vacation. We were unable to weigh the patient today for this TeleHealth visit. She feels as if she has lost weight since her last visit. She has lost 5 lbs since starting treatment with [place].  Hypertension Karen Vazquez is a 63 y.o. female with hypertension. Karen Vazquez denies lightheadedness. She is working weight loss to help control her blood pressure with the goal of decreasing her risk of heart attack and stroke. Karen Vazquez blood pressure is well controlled.  Vitamin D deficiency Karen Vazquez has a diagnosis of vitamin D deficiency. She is currently taking vit D and denies nausea, vomiting or muscle weakness.  ASSESSMENT AND PLAN:  Essential hypertension  Vitamin D deficiency - Plan: Cholecalciferol (VITAMIN D3) 1.25 MG (50000 UT) CAPS  Class 3 severe obesity with serious comorbidity and body mass index (BMI) of 40.0 to 44.9 in adult, unspecified obesity type ([place])  PLAN:  Hypertension We discussed sodium restriction, working on healthy weight loss, and a regular exercise program as the means to achieve improved blood pressure control. Karen Vazquez agreed with this plan and agreed to follow up as directed. We will continue to monitor her blood pressure as well as her progress with the above  lifestyle modifications. She will continue her medications as prescribed and will watch for signs of hypotension as she continues her lifestyle modifications.  Vitamin D Deficiency Karen Vazquez was informed that low vitamin D levels contributes to fatigue and are associated with obesity, breast, and colon cancer. She agrees to continue to take prescription Vit D @50 ,000 IU every week #4 with no refills and will follow up for routine testing of vitamin D, at least 2-3 times per year. She was informed of the risk of over-replacement of vitamin D and agrees to not increase her dose unless she discusses this with [place] first. Karen Vazquez agrees to follow up as directed.  Obesity Karen Vazquez is currently in the action stage of change. As such, her goal is to continue with weight loss efforts She has agreed to follow the Category 2 plan Karen Vazquez will continue her exercise regimen for weight loss and overall health benefits. We discussed the following Behavioral Modification Strategies today: planning for success, increase H2O intake, no skipping meals, keeping healthy foods in the home, increasing lean protein intake, decreasing simple carbohydrates, increasing vegetables, decrease eating out and work on meal planning and intentional eating  Karen Vazquez has agreed to follow up with our clinic in 4 weeks. She was informed of the importance of frequent follow up visits to maximize her success with intensive lifestyle modifications for her multiple health conditions.  ALLERGIES: Allergies  Allergen Reactions  . Lisinopril Cough    MEDICATIONS: Current Outpatient Medications on File Prior to Visit  Medication Sig Dispense Refill  . albuterol (PROVENTIL  HFA;VENTOLIN HFA) 108 (90 Base) MCG/ACT inhaler Inhale 2 puffs into the lungs every 6 (six) hours as needed for wheezing or shortness of breath. 1 Inhaler 2  . ALPRAZolam (XANAX) 0.5 MG tablet TAKE ONE TABLET BY MOUTH TWO TIMES DAILYAS NEEDED FOR ANXIETY OR SLEEP. 30 tablet 5  .  amLODipine (NORVASC) 2.5 MG tablet Take 1 tablet (2.5 mg total) by mouth daily. 30 tablet 5  . ARMOUR THYROID 15 MG tablet TAKE ONE (1) TABLET EACH DAY 30 tablet 5  . benzonatate (TESSALON) 100 MG capsule Take 1 capsule (100 mg total) by mouth 2 (two) times daily as needed for cough. 20 capsule 0  . meloxicam (MOBIC) 15 MG tablet Take 1 tablet (15 mg total) by mouth daily. 30 tablet 0  . oxybutynin (DITROPAN XL) 5 MG 24 hr tablet Take 1 tablet (5 mg total) by mouth daily as needed. 30 tablet 5  . oxyCODONE-acetaminophen (PERCOCET/ROXICET) 5-325 MG tablet Take 1-2 tablets by mouth every 6 (six) hours as needed for severe pain. 10 tablet 0  . Pediatric Multiple Vit-C-FA (FLINSTONES GUMMIES OMEGA-3 DHA PO) Take 1 tablet by mouth daily.    Marland Kitchen rOPINIRole (REQUIP) 4 MG tablet Take 1 tablet (4 mg total) by mouth every evening. 30 tablet 5  . scopolamine (TRANSDERM-SCOP, 1.5 MG,) 1 MG/3DAYS Place one behind ear every 3 days 3 patch 2  . thyroid (ARMOUR THYROID) 30 MG tablet TAKE ONE (1) TABLET BY MOUTH EVERY DAY 30 tablet 5  . zolpidem (AMBIEN) 10 MG tablet Take 1 tablet (10 mg total) by mouth at bedtime as needed for sleep. 90 tablet 1   No current facility-administered medications on file prior to visit.     PAST MEDICAL HISTORY: Past Medical History:  Diagnosis Date  . Anemia   . Anxiety   . Arthritis   . Back pain   . Blood transfusion 2011   at [facility]  . Bulging lumbar disc   . Chest pain   . Depression   . Dry mouth   . Easy bruising   . Excessive thirst   . Fatigue   . Frequent urination   . GERD (gastroesophageal reflux disease)   . Headache   . Heat intolerance   . Hypertension   . Hypothyroidism   . Joint pain   . Leg pain   . Muscle stiffness   . Nervousness   . Palpitations   . Pre-diabetes   . Restless leg syndrome   . Shortness of breath on exertion   . Sleep apnea    does not use c-pap machine  . Stomach ulcer   . Stress   . Swelling of both lower extremities   .  Trouble in sleeping   . Vitamin D deficiency   . Weakness     PAST SURGICAL HISTORY: Past Surgical History:  Procedure Laterality Date  . APPENDECTOMY    . BLADDER SUSPENSION  12/29/2010   Procedure: TRANSVAGINAL TAPE (TVT) PROCEDURE;  Surgeon: Daria Pastures;  Location: [facility] [place] [facility];  Service: Gynecology;  Laterality: N/A;  . BREAST SURGERY     left breast lumpectomy 1977  . COLONOSCOPY N/A 03/03/2016   Procedure: COLONOSCOPY;  Surgeon: Rogene Houston, MD;  Location: [facility];  Service: Endoscopy;  Laterality: N/A;  2:00 - moved to 12/14 @ 12:55 - Ann notified pt  . CYSTOCELE REPAIR  12/29/2010   Procedure: ANTERIOR REPAIR (CYSTOCELE);  Surgeon: Daria Pastures;  Location: [facility];  Service: Gynecology;  Laterality: N/A;  . CYSTOSCOPY  12/29/2010   Procedure: CYSTOSCOPY;  Surgeon: Daria Pastures;  Location: [facility];  Service: Gynecology;  Laterality: N/A;  . JOINT REPLACEMENT  2011   rt knee  . REPLACEMENT TOTAL KNEE Right 2011  . TONSILLECTOMY    . TOTAL KNEE ARTHROPLASTY Left 11/17/2014   Procedure: LEFT TOTAL KNEE ARTHROPLASTY;  Surgeon: Gaynelle Arabian, MD;  Location: [facility];  Service: Orthopedics;  Laterality: Left;  . TUBAL LIGATION    . VAGINAL HYSTERECTOMY  12/29/2010   Procedure: HYSTERECTOMY VAGINAL;  Surgeon: Daria Pastures;  Location: [facility];  Service: Gynecology;  Laterality: N/A;    SOCIAL HISTORY: Social History   Tobacco Use  . Smoking status: Never Smoker  . Smokeless tobacco: Never Used  Substance Use Topics  . Alcohol use: No  . Drug use: No    FAMILY HISTORY: Family History  Problem Relation Age of Onset  . Stroke Mother   . Hypertension Mother   . Hyperlipidemia Mother   . Thyroid disease Mother   . Heart disease Father   . COPD Father   . Cancer Father   . Depression Father   . Cancer Paternal Grandmother        breast  . Diabetes Paternal Grandfather     ROS: Review of Systems  Constitutional: Positive for weight loss.   Gastrointestinal: Negative for nausea and vomiting.  Musculoskeletal:       Negative for muscle weakness  Neurological:       Negative for lightheadedness    PHYSICAL EXAM: Pt in no acute distress  RECENT LABS AND TESTS: BMET    Component Value Date/Time   NA 138 07/24/2018 1059   K 4.5 07/24/2018 1059   CL 102 07/24/2018 1059   CO2 23 07/24/2018 1059   GLUCOSE 92 07/24/2018 1059   GLUCOSE 109 (H) 11/19/2014 0520   BUN 17 07/24/2018 1059   CREATININE 0.82 07/24/2018 1059   CREATININE 0.89 12/12/2012 0813   CALCIUM 9.9 07/24/2018 1059   GFRNONAA 76 07/24/2018 1059   GFRAA 88 07/24/2018 1059   Lab Results  Component Value Date   HGBA1C 5.3 07/24/2018   HGBA1C 5.4 11/28/2017   HGBA1C 5.2 07/20/2017   HGBA1C 5.4 06/12/2014   HGBA1C 5.4 11/21/2013   Lab Results  Component Value Date   INSULIN 15.8 07/24/2018   INSULIN 7.8 11/28/2017   INSULIN 12.5 07/20/2017   CBC    Component Value Date/Time   WBC 5.5 11/28/2017 1403   WBC 11.6 (H) 11/19/2014 0520   RBC 4.93 11/28/2017 1403   RBC 3.88 11/19/2014 0520   HGB 14.0 11/28/2017 1403   HCT 43.5 11/28/2017 1403   PLT 201 06/20/2017 0907   MCV 88 11/28/2017 1403   MCH 28.4 11/28/2017 1403   MCH 28.9 11/19/2014 0520   MCHC 32.2 11/28/2017 1403   MCHC 32.7 11/19/2014 0520   RDW 14.8 11/28/2017 1403   LYMPHSABS 1.6 11/28/2017 1403   MONOABS 0.5 11/27/2013 1131   EOSABS 0.1 11/28/2017 1403   BASOSABS 0.0 11/28/2017 1403   Iron/TIBC/Ferritin/ %Sat    Component Value Date/Time   IRON 78 12/01/2016 0819   IRON 18 (L) 06/30/2016 0924   TIBC 315 12/01/2016 0819   TIBC 497 (H) 06/30/2016 0924   FERRITIN 20 07/14/2017 0828   IRONPCTSAT 25 12/01/2016 0819   Lipid Panel     Component Value Date/Time   CHOL 195 07/24/2018 1059   TRIG 76 07/24/2018 1059  HDL 45 07/24/2018 1059   CHOLHDL 4.8 (H) 06/20/2017 0907   CHOLHDL 4.2 11/21/2013 0712   VLDL 18 11/21/2013 0712   LDLCALC 135 (H) 07/24/2018 1059   Hepatic  Function Panel     Component Value Date/Time   PROT 6.8 07/24/2018 1059   ALBUMIN 4.4 07/24/2018 1059   AST 21 07/24/2018 1059   ALT 17 07/24/2018 1059   ALKPHOS 127 (H) 07/24/2018 1059   BILITOT 0.4 07/24/2018 1059   BILIDIR 0.09 06/20/2017 0907   IBILI 0.3 12/12/2012 0813      Component Value Date/Time   TSH 1.280 07/24/2018 1059   TSH 1.050 11/28/2017 1403   TSH 2.030 07/14/2017 0828     Ref. Range 07/24/2018 10:59  Vitamin D, 25-Hydroxy Latest Ref Range: 30.0 - 100.0 ng/mL 26.8 (L)    I, Doreene Nest, am acting as Location manager for [employer]. Owens Shark, DO  I have reviewed the above documentation for accuracy and completeness, and I agree with the above. -Jearld Lesch, DO

## 2018-10-25 ENCOUNTER — Ambulatory Visit (INDEPENDENT_AMBULATORY_CARE_PROVIDER_SITE_OTHER): Payer: Federal, State, Local not specified - PPO | Admitting: Bariatrics

## 2018-11-19 ENCOUNTER — Other Ambulatory Visit: Payer: Self-pay

## 2018-11-19 ENCOUNTER — Encounter: Payer: Self-pay | Admitting: Family Medicine

## 2018-11-20 ENCOUNTER — Ambulatory Visit (INDEPENDENT_AMBULATORY_CARE_PROVIDER_SITE_OTHER): Payer: Federal, State, Local not specified - PPO | Admitting: Family Medicine

## 2018-11-20 ENCOUNTER — Other Ambulatory Visit: Payer: Self-pay

## 2018-11-20 VITALS — BP 148/89

## 2018-11-20 DIAGNOSIS — R7303 Prediabetes: Secondary | ICD-10-CM

## 2018-11-20 DIAGNOSIS — E559 Vitamin D deficiency, unspecified: Secondary | ICD-10-CM | POA: Diagnosis not present

## 2018-11-20 DIAGNOSIS — R0600 Dyspnea, unspecified: Secondary | ICD-10-CM

## 2018-11-20 DIAGNOSIS — E7849 Other hyperlipidemia: Secondary | ICD-10-CM

## 2018-11-20 DIAGNOSIS — R0609 Other forms of dyspnea: Secondary | ICD-10-CM | POA: Diagnosis not present

## 2018-11-20 DIAGNOSIS — I10 Essential (primary) hypertension: Secondary | ICD-10-CM | POA: Diagnosis not present

## 2018-11-20 MED ORDER — VITAMIN D3 1.25 MG (50000 UT) PO CAPS: 1.0000 | ORAL_CAPSULE | ORAL | 1 refills | Status: DC

## 2018-11-20 MED ORDER — AMLODIPINE BESYLATE 5 MG PO TABS: 5.0000 mg | ORAL_TABLET | Freq: Every day | ORAL | 5 refills | Status: DC

## 2018-11-20 NOTE — Progress Notes (Signed)
Subjective:    Patient ID: Karen Vazquez, female    DOB: 1955-04-20, 63 y.o.   MRN: [id number] Initially virtual but she was brought to the office to have this evaluated more closely Hypertension This is a chronic problem. The current episode started more than 1 year ago. Associated symptoms include shortness of breath. Pertinent negatives include no chest pain. Risk factors for coronary artery disease include post-menopausal state. Treatments tried: amlodipine. There are no compliance problems.    Patient reports she is having SOB she believes is related to stress.  She relates her shortness of breath she notices it more when she walks when she goes up an incline she denies any chest pressure tightness or pain she does relate her blood pressure is been elevated she denies any swelling in her lower legs she states she is under a fair amount of stress having to help her grandson with virtual school Patient states she is helping grandson with virtual learning and it is really really stressful- almost in tears at times PMH includes morbid obesity prediabetes hyperlipidemia as well as hypertension  Review of Systems  Constitutional: Negative for activity change, appetite change and fatigue.  HENT: Negative for congestion and rhinorrhea.   Respiratory: Positive for shortness of breath. Negative for cough.   Cardiovascular: Negative for chest pain and leg swelling.  Gastrointestinal: Negative for abdominal pain and diarrhea.  Endocrine: Negative for polydipsia and polyphagia.  Skin: Negative for color change.  Neurological: Negative for dizziness and weakness.  Psychiatric/Behavioral: Negative for behavioral problems and confusion.   Virtual Visit via Video Note  I connected with Karen Vazquez on [id number] at  3:00 PM EDT by a video enabled telemedicine application and verified that I am speaking with the correct person using two identifiers.  Location: Patient: Office Provider: office  I discussed the limitations of evaluation and management by telemedicine and the availability of in person appointments. The patient expressed understanding and agreed to proceed.  History of Present Illness:    Observations/Objective:   Assessment and Plan:   Follow Up Instructions:    I discussed the assessment and treatment plan with the patient. The patient was provided an opportunity to ask questions and all were answered. The patient agreed with the plan and demonstrated an understanding of the instructions.   The patient was advised to call back or seek an in-person evaluation if the symptoms worsen or if the condition fails to improve as anticipated.  I provided 25 minutes of non-face-to-face time during this encounter.         Objective:   Physical Exam Vitals signs reviewed.  Constitutional:      General: She is not in acute distress. HENT:     Head: Normocephalic and atraumatic.  Eyes:     General:        Right eye: No discharge.        Left eye: No discharge.  Neck:     Trachea: No tracheal deviation.  Cardiovascular:     Rate and Rhythm: Normal rate and regular rhythm.     Heart sounds: Normal heart sounds. No murmur.  Pulmonary:     Effort: Pulmonary effort is normal. No respiratory distress.     Breath sounds: Normal breath sounds. No wheezing or rhonchi.  Musculoskeletal:        General: No swelling or deformity.     Right lower leg: No edema.     Left lower leg: No edema.  Lymphadenopathy:  Cervical: No cervical adenopathy.  Skin:    General: Skin is warm and dry.  Neurological:     Mental Status: She is alert.     Coordination: Coordination normal.  Psychiatric:        Behavior: Behavior normal.           Assessment & Plan:  1. Essential hypertension Blood pressure is not under good control increase amlodipine 5 mg follow-up again in 4 to 6 weeks to recheck  2. DOE (dyspnea on exertion) Significant DOE given that her blood  pressure is elevated and she is moderately overweight it could be deconditioning but it could also be a sign of ventricular dysfunction I doubt angina I recommend an echo she does not - PR ELECTROCARDIOGRAM, COMPLETE - ECHOCARDIOGRAM COMPLETE  3. Vitamin D deficiency She does have a history of vitamin D deficiency we will check vitamin D level continue 50,000 units once weekly for the next 6 weeks then shifted over 2000 units daily - Cholecalciferol (VITAMIN D3) 1.25 MG (50000 UT) CAPS; Take 1 Dose by mouth once a week.  Dispense: 4 capsule; Refill: 1 - VITAMIN D 25 Hydroxy (Vit-D Deficiency, Fractures)  4. Other hyperlipidemia History of hyperlipidemia check lipid profile watch diet closely - Lipid panel  5. Prediabetes Prediabetes check lab work watch diet minimize starches - Basic metabolic panel  Significant morbid obesity patient encouraged to lose weight to try to help her overall status  If the echo does not explain the issue there is possibility may have to get cardiology involved but also feel the patient needs to lose weight work hard at getting blood pressure under control and getting him better condition  25 minutes was spent with the patient.  This statement verifies that 25 minutes was indeed spent with the patient.  More than 50% of this visit-total duration of the visit-was spent in counseling and coordination of care. The issues that the patient came in for today as reflected in the diagnosis (s) please refer to documentation for further details.  EKG did not show any acute changes this was compared to May 2019

## 2018-11-21 ENCOUNTER — Telehealth: Payer: Self-pay | Admitting: Family Medicine

## 2018-11-21 ENCOUNTER — Other Ambulatory Visit: Payer: Self-pay | Admitting: Family Medicine

## 2018-11-21 MED ORDER — ERGOCALCIFEROL 1.25 MG (50000 UT) PO CAPS: 50000.0000 [IU] | ORAL_CAPSULE | ORAL | 2 refills | Status: DC

## 2018-11-21 NOTE — Telephone Encounter (Signed)
Karen Vazquez from [facility] contacted office and is needing clarification regarding VIT D script that was received yesterday. Karen Vazquez states that pt has been taking Ergocalciferol 50000 units; insurance will cover this. Script was sent in for OTC Cholecalciferol which insurance will not cover. Karen Vazquez needing clarification on what med provider would like patient on. Please advise. Thank you

## 2018-11-21 NOTE — Telephone Encounter (Signed)
ergocalcipheral 500000 was sent in as requested the other was canceled

## 2018-11-21 NOTE — Telephone Encounter (Signed)
Jonni Sanger at [facility] has been notified

## 2018-11-22 ENCOUNTER — Encounter: Payer: Self-pay | Admitting: Family Medicine

## 2018-12-04 ENCOUNTER — Ambulatory Visit (HOSPITAL_COMMUNITY)
Admission: RE | Admit: 2018-12-04 | Discharge: 2018-12-04 | Disposition: A | Discharge status: Home / Self Care | Payer: Federal, State, Local not specified - PPO | Source: Ambulatory Visit | Attending: Family Medicine | Admitting: Family Medicine

## 2018-12-04 ENCOUNTER — Other Ambulatory Visit: Payer: Self-pay

## 2018-12-04 DIAGNOSIS — R0609 Other forms of dyspnea: Secondary | ICD-10-CM | POA: Diagnosis not present

## 2018-12-04 NOTE — Progress Notes (Signed)
*  PRELIMINARY RESULTS* Echocardiogram 2D Echocardiogram has been performed.  Karen Vazquez 12/04/2018, 1:46 PM

## 2018-12-24 DIAGNOSIS — G4733 Obstructive sleep apnea (adult) (pediatric): Secondary | ICD-10-CM | POA: Diagnosis not present

## 2019-01-01 ENCOUNTER — Ambulatory Visit: Payer: Federal, State, Local not specified - PPO | Admitting: Family Medicine

## 2019-01-01 DIAGNOSIS — E559 Vitamin D deficiency, unspecified: Secondary | ICD-10-CM | POA: Diagnosis not present

## 2019-01-01 DIAGNOSIS — E7849 Other hyperlipidemia: Secondary | ICD-10-CM | POA: Diagnosis not present

## 2019-01-01 DIAGNOSIS — R7303 Prediabetes: Secondary | ICD-10-CM | POA: Diagnosis not present

## 2019-01-02 ENCOUNTER — Encounter: Payer: Self-pay | Admitting: Family Medicine

## 2019-01-02 ENCOUNTER — Ambulatory Visit (INDEPENDENT_AMBULATORY_CARE_PROVIDER_SITE_OTHER): Payer: Federal, State, Local not specified - PPO | Admitting: Family Medicine

## 2019-01-02 ENCOUNTER — Other Ambulatory Visit: Payer: Self-pay

## 2019-01-02 VITALS — BP 122/76 | Temp 98.2°F | Wt 249.4 lb

## 2019-01-02 DIAGNOSIS — I951 Orthostatic hypotension: Secondary | ICD-10-CM | POA: Diagnosis not present

## 2019-01-02 DIAGNOSIS — I10 Essential (primary) hypertension: Secondary | ICD-10-CM | POA: Diagnosis not present

## 2019-01-02 LAB — BASIC METABOLIC PANEL
BUN/Creatinine Ratio: 19 (ref 12–28)
BUN: 18 mg/dL (ref 8–27)
CO2: 26 mmol/L (ref 20–29)
Calcium: 9.5 mg/dL (ref 8.7–10.3)
Chloride: 104 mmol/L (ref 96–106)
Creatinine, Ser: 0.97 mg/dL (ref 0.57–1.00)
GFR calc Af Amer: 72 mL/min/{1.73_m2} (ref >59)
GFR calc non Af Amer: 62 mL/min/{1.73_m2} (ref >59)
Glucose: 92 mg/dL (ref 65–99)
Potassium: 5.3 mmol/L — ABNORMAL HIGH (ref 3.5–5.2)
Sodium: 140 mmol/L (ref 134–144)

## 2019-01-02 LAB — LIPID PANEL
Chol/HDL Ratio: 4 ratio (ref 0.0–4.4)
Cholesterol, Total: 159 mg/dL (ref 100–199)
HDL: 40 mg/dL (ref >39)
LDL Chol Calc (NIH): 98 mg/dL (ref 0–99)
Triglycerides: 117 mg/dL (ref 0–149)
VLDL Cholesterol Cal: 21 mg/dL (ref 5–40)

## 2019-01-02 LAB — VITAMIN D 25 HYDROXY (VIT D DEFICIENCY, FRACTURES): Vit D, 25-Hydroxy: 33.7 ng/mL (ref 30.0–100.0)

## 2019-01-02 MED ORDER — AMLODIPINE BESYLATE 2.5 MG PO TABS: 2.5000 mg | ORAL_TABLET | Freq: Every day | ORAL | 3 refills | Status: DC

## 2019-01-02 NOTE — Patient Instructions (Signed)

## 2019-01-02 NOTE — Progress Notes (Signed)
   Subjective:    Patient ID: Karen Vazquez, female    DOB: 09/14/1955, 63 y.o.   MRN: [id number]  HPI Pt here today for follow up on Amlodipine dose that was increased. Patient states that she finds herself feeling very fatigued no energy feels little lightheaded when she stands up denies any falls denies any injuries no other particular troubles PMH benign   Pt states she has no energy, "heavy" feeling in pelvic area and urinary frequency. Pt is taking Amlodipine 5 mg each morning. She was increased from 2.5 mg to 5 mg in September.   Patient is trying to lose weight she is trying to watch her diet she is also trying to use her exercise bike she follows with orthopedics with her knee Review of Systems  Constitutional: Negative for activity change, fatigue and fever.  HENT: Negative for congestion and rhinorrhea.   Respiratory: Negative for cough, chest tightness and shortness of breath.   Cardiovascular: Negative for chest pain and leg swelling.  Gastrointestinal: Negative for abdominal pain and nausea.  Skin: Negative for color change.  Neurological: Negative for dizziness and headaches.  Psychiatric/Behavioral: Negative for agitation and behavioral problems.       Objective:   Physical Exam Vitals signs reviewed.  Constitutional:      General: She is not in acute distress. HENT:     Head: Normocephalic and atraumatic.  Eyes:     General:        Right eye: No discharge.        Left eye: No discharge.  Neck:     Trachea: No tracheal deviation.  Cardiovascular:     Rate and Rhythm: Normal rate and regular rhythm.     Heart sounds: Normal heart sounds. No murmur.  Pulmonary:     Effort: Pulmonary effort is normal. No respiratory distress.     Breath sounds: Normal breath sounds.  Lymphadenopathy:     Cervical: No cervical adenopathy.  Skin:    General: Skin is warm and dry.  Neurological:     Mental Status: She is alert.     Coordination: Coordination normal.   Psychiatric:        Behavior: Behavior normal.   Blood pressure was checked it is 120/76        Assessment & Plan:  Relative low blood pressure for this patient Go back to 2.5 mg amlodipine daily Patient will try to watch salt in her diet exercise more and bring her weight down Follow-up in 6 weeks bring her blood pressure cuff with her Ideally I would like to see her blood pressure staying in the 130s over 80s or less Hopefully by going down on the medication she will feel more energetic

## 2019-01-29 ENCOUNTER — Other Ambulatory Visit: Payer: Self-pay | Admitting: Family Medicine

## 2019-01-31 DIAGNOSIS — Z471 Aftercare following joint replacement surgery: Secondary | ICD-10-CM | POA: Diagnosis not present

## 2019-01-31 DIAGNOSIS — Z96652 Presence of left artificial knee joint: Secondary | ICD-10-CM | POA: Diagnosis not present

## 2019-02-06 ENCOUNTER — Encounter: Payer: Self-pay | Admitting: Family Medicine

## 2019-02-06 ENCOUNTER — Other Ambulatory Visit: Payer: Self-pay

## 2019-02-06 ENCOUNTER — Ambulatory Visit: Payer: Federal, State, Local not specified - PPO | Admitting: Family Medicine

## 2019-02-06 VITALS — BP 118/76 | Temp 97.1°F | Ht 64.0 in | Wt 242.8 lb

## 2019-02-06 DIAGNOSIS — E038 Other specified hypothyroidism: Secondary | ICD-10-CM

## 2019-02-06 DIAGNOSIS — I10 Essential (primary) hypertension: Secondary | ICD-10-CM | POA: Diagnosis not present

## 2019-02-06 DIAGNOSIS — G2581 Restless legs syndrome: Secondary | ICD-10-CM

## 2019-02-06 MED ORDER — ERGOCALCIFEROL 1.25 MG (50000 UT) PO CAPS: 50000.0000 [IU] | ORAL_CAPSULE | ORAL | 2 refills | Status: DC

## 2019-02-06 MED ORDER — ZOLPIDEM TARTRATE 10 MG PO TABS: 10.0000 mg | ORAL_TABLET | Freq: Every evening | ORAL | 1 refills | Status: DC | PRN

## 2019-02-06 MED ORDER — THYROID 30 MG PO TABS: ORAL_TABLET | ORAL | 5 refills | Status: DC

## 2019-02-06 MED ORDER — ARMOUR THYROID 15 MG PO TABS: ORAL_TABLET | ORAL | 5 refills | Status: DC

## 2019-02-06 MED ORDER — AMLODIPINE BESYLATE 2.5 MG PO TABS: 2.5000 mg | ORAL_TABLET | Freq: Every day | ORAL | 6 refills | Status: DC

## 2019-02-06 NOTE — Progress Notes (Signed)
Subjective:    Patient ID: Karen Vazquez, female    DOB: 15-Jan-1956, 63 y.o.   MRN: [id number]  Hypertension This is a chronic problem. Pertinent negatives include no chest pain or shortness of breath. Treatments tried: norvasc 2.5. Compliance problems: takes med every day, eats healthy, has lost 7 lbs since last month, not able to do exercise right now due to knee problem.    Patient states restless legs doing well with medication.  Does not have to change anything  Patient relates insomnia doing well with the Ambien helps her rest and sleep  Patient working hard to try to lose some weight watching portions staying active   Review of Systems  Constitutional: Negative for activity change, appetite change and fatigue.  HENT: Negative for congestion and rhinorrhea.   Respiratory: Negative for cough and shortness of breath.   Cardiovascular: Negative for chest pain and leg swelling.  Gastrointestinal: Negative for abdominal pain and diarrhea.  Endocrine: Negative for polydipsia and polyphagia.  Skin: Negative for color change.  Neurological: Negative for dizziness and weakness.  Psychiatric/Behavioral: Negative for behavioral problems and confusion.       Objective:   Physical Exam Vitals signs reviewed.  Constitutional:      General: She is not in acute distress. HENT:     Head: Normocephalic and atraumatic.  Eyes:     General:        Right eye: No discharge.        Left eye: No discharge.  Neck:     Trachea: No tracheal deviation.  Cardiovascular:     Rate and Rhythm: Normal rate and regular rhythm.     Heart sounds: Normal heart sounds. No murmur.  Pulmonary:     Effort: Pulmonary effort is normal. No respiratory distress.     Breath sounds: Normal breath sounds.  Lymphadenopathy:     Cervical: No cervical adenopathy.  Skin:    General: Skin is warm and dry.  Neurological:     Mental Status: She is alert.     Coordination: Coordination normal.  Psychiatric:         Behavior: Behavior normal.    Lab work from October 13 reviewed  Results for orders placed or performed in visit on 11/20/18  Lipid panel  Result Value Ref Range   Cholesterol, Total 159 100 - 199 mg/dL   Triglycerides 117 0 - 149 mg/dL   HDL 40 >39 mg/dL   VLDL Cholesterol Cal 21 5 - 40 mg/dL   LDL Chol Calc (NIH) 98 0 - 99 mg/dL   Chol/HDL Ratio 4.0 0.0 - 4.4 ratio  VITAMIN D 25 Hydroxy (Vit-D Deficiency, Fractures)  Result Value Ref Range   Vit D, 25-Hydroxy 33.7 30.0 - 100.0 ng/mL  Basic metabolic panel  Result Value Ref Range   Glucose 92 65 - 99 mg/dL   BUN 18 8 - 27 mg/dL   Creatinine, Ser 0.97 0.57 - 1.00 mg/dL   GFR calc non Af Amer 62 >59 mL/min/1.73   GFR calc Af Amer 72 >59 mL/min/1.73   BUN/Creatinine Ratio 19 12 - 28   Sodium 140 134 - 144 mmol/L   Potassium 5.3 (H) 3.5 - 5.2 mmol/L   Chloride 104 96 - 106 mmol/L   CO2 26 20 - 29 mmol/L   Calcium 9.5 8.7 - 10.3 mg/dL    25 minutes was spent with the patient.  This statement verifies that 25 minutes was indeed spent with the patient.  More  than 50% of this visit-total duration of the visit-was spent in counseling and coordination of care. The issues that the patient came in for today as reflected in the diagnosis (s) please refer to documentation for further details.     Assessment & Plan:  Blood pressure good control with current medication continue current measures watch diet minimize salt  Follow-up in 6 months regarding blood pressure  Morbid obesity patient is losing weight watching portions try to stay active  Hypothyroidism patient continues current medication refills given  Ambien for insomnia does not misuse it gives proper amount of rest for it  Rarely uses Xanax  Restless legs doing well with current medication continue  Patient with overactive bladder but is not bothering her a lot I recommend that she consider dropping off her oxybutynin because of increased risk with anticholinergic  and increased risk of dementia

## 2019-02-06 NOTE — Patient Instructions (Signed)
January February if he has any issues we can help with any alcohol that is fine it can be an in person as well I will leave it up to him switch his first name again [place] stands for "Dietary Approaches to Stop Hypertension." The DASH eating plan is a healthy eating plan that has been shown to reduce high blood pressure (hypertension). It may also reduce your risk for type 2 diabetes, heart disease, and stroke. The DASH eating plan may also help with weight loss. What are tips for following this plan?  General guidelines  Avoid eating more than 2,300 mg (milligrams) of salt (sodium) a day. If you have hypertension, you may need to reduce your sodium intake to 1,500 mg a day.  Limit alcohol intake to no more than 1 drink a day for nonpregnant women and 2 drinks a day for men. One drink equals 12 oz of beer, 5 oz of wine, or 1 oz of hard liquor.  Work with your health care provider to maintain a healthy body weight or to lose weight. Ask what an ideal weight is for you.  Get at least 30 minutes of exercise that causes your heart to beat faster (aerobic exercise) most days of the week. Activities may include walking, swimming, or biking.  Work with your health care provider or diet and nutrition specialist (dietitian) to adjust your eating plan to your individual calorie needs. Reading food labels   Check food labels for the amount of sodium per serving. Choose foods with less than 5 percent of the Daily Value of sodium. Generally, foods with less than 300 mg of sodium per serving fit into this eating plan.  To find whole grains, look for the word "whole" as the first word in the ingredient list. Shopping  Buy products labeled as "low-sodium" or "no salt added."  Buy fresh foods. Avoid canned foods and premade or frozen meals. Cooking  Avoid adding salt when cooking. Use salt-free seasonings or herbs instead of table salt or sea salt. Check with your health care provider  or pharmacist before using salt substitutes.  Do not fry foods. Cook foods using healthy methods such as baking, boiling, grilling, and broiling instead.  Cook with heart-healthy oils, such as olive, canola, soybean, or sunflower oil. Meal planning  Eat a balanced diet that includes: ? 5 or more servings of fruits and vegetables each day. At each meal, try to fill half of your plate with fruits and vegetables. ? Up to 6-8 servings of whole grains each day. ? Less than 6 oz of lean meat, poultry, or fish each day. A 3-oz serving of meat is about the same size as a deck of cards. One egg equals 1 oz. ? 2 servings of low-fat dairy each day. ? A serving of nuts, seeds, or beans 5 times each week. ? Heart-healthy fats. Healthy fats called Omega-3 fatty acids are found in foods such as flaxseeds and coldwater fish, like sardines, salmon, and mackerel.  Limit how much you eat of the following: ? Canned or prepackaged foods. ? Food that is high in trans fat, such as fried foods. ? Food that is high in saturated fat, such as fatty meat. ? Sweets, desserts, sugary drinks, and other foods with added sugar. ? Full-fat dairy products.  Do not salt foods before eating.  Try to eat at least 2 vegetarian meals each week.  Eat more home-cooked food and less restaurant, buffet, and fast food.  When  eating at a restaurant, ask that your food be prepared with less salt or no salt, if possible. What foods are recommended? The items listed may not be a complete list. Talk with your dietitian about what dietary choices are best for you. Grains Whole-grain or whole-wheat bread. Whole-grain or whole-wheat pasta. Brown rice. [place]. Bulgur. Whole-grain and low-sodium cereals. Pita bread. Low-fat, low-sodium crackers. Whole-wheat flour tortillas. Vegetables Fresh or frozen vegetables (raw, steamed, roasted, or grilled). Low-sodium or reduced-sodium tomato and vegetable juice. Low-sodium or  reduced-sodium tomato sauce and tomato paste. Low-sodium or reduced-sodium canned vegetables. Fruits All fresh, dried, or frozen fruit. Canned fruit in natural juice (without added sugar). Meat and other protein foods Skinless chicken or [place]. Ground chicken or [place]. Pork with fat trimmed off. Fish and seafood. Egg whites. Dried beans, peas, or lentils. Unsalted nuts, nut butters, and seeds. Unsalted canned beans. Lean cuts of beef with fat trimmed off. Low-sodium, lean deli meat. Dairy Low-fat (1%) or fat-free (skim) milk. Fat-free, low-fat, or reduced-fat cheeses. Nonfat, low-sodium ricotta or cottage cheese. Low-fat or nonfat yogurt. Low-fat, low-sodium cheese. Fats and oils Soft margarine without trans fats. Vegetable oil. Low-fat, reduced-fat, or light mayonnaise and salad dressings (reduced-sodium). Canola, safflower, olive, soybean, and sunflower oils. Avocado. Seasoning and other foods Herbs. Spices. Seasoning mixes without salt. Unsalted popcorn and pretzels. Fat-free sweets. What foods are not recommended? The items listed may not be a complete list. Talk with your dietitian about what dietary choices are best for you. Grains Baked goods made with fat, such as croissants, muffins, or some breads. Dry pasta or rice meal packs. Vegetables Creamed or fried vegetables. Vegetables in a cheese sauce. Regular canned vegetables (not low-sodium or reduced-sodium). Regular canned tomato sauce and paste (not low-sodium or reduced-sodium). Regular tomato and vegetable juice (not low-sodium or reduced-sodium). Angie Fava. Olives. Fruits Canned fruit in a light or heavy syrup. Fried fruit. Fruit in cream or butter sauce. Meat and other protein foods Fatty cuts of meat. Ribs. Fried meat. Berniece Salines. Sausage. Bologna and other processed lunch meats. Salami. Fatback. Hotdogs. Bratwurst. Salted nuts and seeds. Canned beans with added salt. Canned or smoked fish. Whole eggs or egg yolks. Chicken or [place]  with skin. Dairy Whole or 2% milk, cream, and half-and-half. Whole or full-fat cream cheese. Whole-fat or sweetened yogurt. Full-fat cheese. Nondairy creamers. Whipped toppings. Processed cheese and cheese spreads. Fats and oils Butter. Stick margarine. Lard. Shortening. Ghee. Bacon fat. Tropical oils, such as coconut, palm kernel, or palm oil. Seasoning and other foods Salted popcorn and pretzels. Onion salt, garlic salt, seasoned salt, table salt, and sea salt. Worcestershire sauce. Tartar sauce. Barbecue sauce. Teriyaki sauce. Soy sauce, including reduced-sodium. Steak sauce. Canned and packaged gravies. Fish sauce. Oyster sauce. Cocktail sauce. Horseradish that you find on the shelf. Ketchup. Mustard. Meat flavorings and tenderizers. Bouillon cubes. Hot sauce and Tabasco sauce. Premade or packaged marinades. Premade or packaged taco seasonings. Relishes. Regular salad dressings. Where to find more information:  National Heart, Lung, and [place]: https://[place]-eaton.com/  American Heart Association: www.heart.org Summary  The DASH eating plan is a healthy eating plan that has been shown to reduce high blood pressure (hypertension). It may also reduce your risk for type 2 diabetes, heart disease, and stroke.  With the DASH eating plan, you should limit salt (sodium) intake to 2,300 mg a day. If you have hypertension, you may need to reduce your sodium intake to 1,500 mg a day.  When on the DASH eating plan, aim  to eat more fresh fruits and vegetables, whole grains, lean proteins, low-fat dairy, and heart-healthy fats.  Work with your health care provider or diet and nutrition specialist (dietitian) to adjust your eating plan to your individual calorie needs. This information is not intended to replace advice given to you by your health care provider. Make sure you discuss any questions you have with your health care provider. Document Released: 02/24/2011 Document Revised: 02/17/2017  Document Reviewed: 02/29/2016 Elsevier Patient Education  2020 [employer].

## 2019-02-27 ENCOUNTER — Other Ambulatory Visit (HOSPITAL_COMMUNITY): Payer: Self-pay | Admitting: Orthopedic Surgery

## 2019-02-27 ENCOUNTER — Other Ambulatory Visit: Payer: Self-pay | Admitting: Orthopedic Surgery

## 2019-02-27 DIAGNOSIS — Z96652 Presence of left artificial knee joint: Secondary | ICD-10-CM

## 2019-03-07 ENCOUNTER — Other Ambulatory Visit: Payer: Self-pay

## 2019-03-07 ENCOUNTER — Encounter (HOSPITAL_COMMUNITY)
Admission: RE | Admit: 2019-03-07 | Discharge: 2019-03-07 | Disposition: A | Discharge status: Home / Self Care | Payer: Federal, State, Local not specified - PPO | Source: Ambulatory Visit | Attending: Orthopedic Surgery | Admitting: Orthopedic Surgery

## 2019-03-07 DIAGNOSIS — M25562 Pain in left knee: Secondary | ICD-10-CM | POA: Diagnosis not present

## 2019-03-07 DIAGNOSIS — Z96652 Presence of left artificial knee joint: Secondary | ICD-10-CM | POA: Diagnosis not present

## 2019-03-07 MED ORDER — TECHNETIUM TC 99M MEDRONATE IV KIT
20.9000 | PACK | Freq: Once | INTRAVENOUS | Status: AC | PRN
  Administered 2019-03-07: 20.9 via INTRAVENOUS

## 2019-03-26 DIAGNOSIS — G4733 Obstructive sleep apnea (adult) (pediatric): Secondary | ICD-10-CM | POA: Diagnosis not present

## 2019-03-28 DIAGNOSIS — Z23 Encounter for immunization: Secondary | ICD-10-CM | POA: Diagnosis not present

## 2019-04-25 DIAGNOSIS — G4733 Obstructive sleep apnea (adult) (pediatric): Secondary | ICD-10-CM | POA: Diagnosis not present

## 2019-04-25 DIAGNOSIS — Z23 Encounter for immunization: Secondary | ICD-10-CM | POA: Diagnosis not present

## 2019-05-10 ENCOUNTER — Encounter: Payer: Self-pay | Admitting: Family Medicine

## 2019-05-10 NOTE — Telephone Encounter (Signed)
Nurses It would be good to do a follow-up visit with the patient Monday Tuesday or Wednesday preferably late morning that way if we need to run any tests we have plenty of time to do so

## 2019-05-14 ENCOUNTER — Ambulatory Visit: Payer: Federal, State, Local not specified - PPO | Admitting: Family Medicine

## 2019-05-14 ENCOUNTER — Other Ambulatory Visit: Payer: Self-pay

## 2019-05-14 ENCOUNTER — Ambulatory Visit (HOSPITAL_COMMUNITY)
Admission: RE | Admit: 2019-05-14 | Discharge: 2019-05-14 | Disposition: A | Discharge status: Home / Self Care | Payer: Federal, State, Local not specified - PPO | Source: Ambulatory Visit | Attending: Family Medicine | Admitting: Family Medicine

## 2019-05-14 ENCOUNTER — Encounter: Payer: Self-pay | Admitting: Family Medicine

## 2019-05-14 VITALS — BP 124/72 | Temp 98.8°F | Wt 244.2 lb

## 2019-05-14 DIAGNOSIS — E559 Vitamin D deficiency, unspecified: Secondary | ICD-10-CM

## 2019-05-14 DIAGNOSIS — R5383 Other fatigue: Secondary | ICD-10-CM | POA: Diagnosis not present

## 2019-05-14 DIAGNOSIS — R0600 Dyspnea, unspecified: Secondary | ICD-10-CM | POA: Diagnosis not present

## 2019-05-14 DIAGNOSIS — I10 Essential (primary) hypertension: Secondary | ICD-10-CM

## 2019-05-14 DIAGNOSIS — R0609 Other forms of dyspnea: Secondary | ICD-10-CM

## 2019-05-14 DIAGNOSIS — R05 Cough: Secondary | ICD-10-CM

## 2019-05-14 DIAGNOSIS — D509 Iron deficiency anemia, unspecified: Secondary | ICD-10-CM | POA: Diagnosis not present

## 2019-05-14 DIAGNOSIS — G4700 Insomnia, unspecified: Secondary | ICD-10-CM | POA: Diagnosis not present

## 2019-05-14 DIAGNOSIS — R053 Chronic cough: Secondary | ICD-10-CM

## 2019-05-14 DIAGNOSIS — K219 Gastro-esophageal reflux disease without esophagitis: Secondary | ICD-10-CM

## 2019-05-14 MED ORDER — FLUTICASONE PROPIONATE 50 MCG/ACT NA SUSP: 2.0000 | Freq: Every day | NASAL | 5 refills | Status: DC

## 2019-05-14 MED ORDER — ZOLPIDEM TARTRATE 5 MG PO TABS: 5.0000 mg | ORAL_TABLET | Freq: Every evening | ORAL | 1 refills | Status: DC | PRN

## 2019-05-14 MED ORDER — PANTOPRAZOLE SODIUM 40 MG PO TBEC: 40.0000 mg | DELAYED_RELEASE_TABLET | Freq: Every day | ORAL | 3 refills | Status: DC

## 2019-05-14 NOTE — Progress Notes (Signed)
Subjective:    Patient ID: Karen Vazquez, female    DOB: 1955-10-21, 64 y.o.   MRN: [id number]  HPI Pt here today due to no energy, short of breath, and persistent hacking cough. Pt began taking Lyrica 75 mg once at night. Pt has also had to had have iron infusions. Has not had one in a while but pt is having similar symptoms.   Patient relates a lot of fatigue tiredness feeling rundown also states energy level subpar in addition to this also persistent cough over the past month is not a smoker has not been around smoke for years patient does get a little short winded when she pushes herself but denies any type of chest tightness pressure pain  Patient also describes having a spell where she fell out of bed and was not remembering that she fell out of bed but she was taking Ambien 10 mg Essential hypertension - Plan: TSH, T4, free, Hepatic function panel, CBC with Differential, Basic Metabolic Panel (BMET), Ferritin, Iron Binding Cap (TIBC)([facility]), Iron, Vitamin D (25 hydroxy), B12, B Nat Peptide  DOE (dyspnea on exertion) - Plan: DG Chest 2 View, TSH, T4, free, Hepatic function panel, CBC with Differential, Basic Metabolic Panel (BMET), Ferritin, Iron Binding Cap (TIBC)([facility]), Iron, Vitamin D (25 hydroxy), B12, B Nat Peptide  Iron deficiency anemia, unspecified iron deficiency anemia type - Plan: TSH, T4, free, Hepatic function panel, CBC with Differential, Basic Metabolic Panel (BMET), Ferritin, Iron Binding Cap (TIBC)([facility]), Iron, Vitamin D (25 hydroxy), B12, B Nat Peptide  Insomnia, unspecified type - Plan: TSH, T4, free, Hepatic function panel, CBC with Differential, Basic Metabolic Panel (BMET), Ferritin, Iron Binding Cap (TIBC)([facility]), Iron, Vitamin D (25 hydroxy), B12, B Nat Peptide  Other fatigue - Plan: TSH, T4, free, Hepatic function panel, CBC with Differential, Basic Metabolic Panel (BMET), Ferritin, Iron Binding Cap  (TIBC)([facility]), Iron, Vitamin D (25 hydroxy), B12, B Nat Peptide  Vitamin D deficiency - Plan: TSH, T4, free, Hepatic function panel, CBC with Differential, Basic Metabolic Panel (BMET), Ferritin, Iron Binding Cap (TIBC)([facility]), Iron, Vitamin D (25 hydroxy), B12, B Nat Peptide  Persistent cough - Plan: DG Chest 2 View, TSH, T4, free, Hepatic function panel, CBC with Differential, Basic Metabolic Panel (BMET), Ferritin, Iron Binding Cap (TIBC)([facility]), Iron, Vitamin D (25 hydroxy), B12, B Nat Peptide  Gastroesophageal reflux disease without esophagitis - Plan: TSH, T4, free, Hepatic function panel, CBC with Differential, Basic Metabolic Panel (BMET), Ferritin, Iron Binding Cap (TIBC)([facility]), Iron, Vitamin D (25 hydroxy), B12, B Nat Peptide    Review of Systems  Constitutional: Negative for activity change, appetite change and fatigue.  HENT: Negative for congestion and rhinorrhea.   Respiratory: Positive for cough and shortness of breath.   Cardiovascular: Negative for chest pain and leg swelling.  Gastrointestinal: Negative for abdominal pain and diarrhea.  Endocrine: Negative for polydipsia and polyphagia.  Skin: Negative for color change.  Neurological: Negative for dizziness and weakness.  Psychiatric/Behavioral: Negative for behavioral problems and confusion.       Objective:   Physical Exam Lungs clear respiratory rate normal heart regular no murmurs extremities trace edema in the lower legs skin warm dry neurologic grossly normal       Assessment & Plan:  1. Essential hypertension Blood pressure very good on current medication continue amlodipine 2.5 minimal pedal edema may have to switch over to a diuretic if pedal edema gets worse - TSH - T4, free - Hepatic function panel - CBC with Differential - Basic  Metabolic Panel (BMET) - Ferritin - Iron Binding Cap (TIBC)([facility]) - Iron - Vitamin D (25 hydroxy) - B12 - B Nat  Peptide  2. DOE (dyspnea on exertion) Patient with a persistent cough gets a little short winded when she pushes her self also fatigue this could be due to iron deficiency but we do need to do a chest x-ray patient may also need to have pulmonary function testing and pulmonary consult depending on how she does - DG Chest 2 View - TSH - T4, free - Hepatic function panel - CBC with Differential - Basic Metabolic Panel (BMET) - Ferritin - Iron Binding Cap (TIBC)([facility]) - Iron - Vitamin D (25 hydroxy) - B12 - B Nat Peptide  3. Iron deficiency anemia, unspecified iron deficiency anemia type Certainly check lab work may need iron infusion await the results - TSH - T4, free - Hepatic function panel - CBC with Differential - Basic Metabolic Panel (BMET) - Ferritin - Iron Binding Cap (TIBC)([facility]) - Iron - Vitamin D (25 hydroxy) - B12 - B Nat Peptide  4. Insomnia, unspecified type Patient describes 1 time where she rolled out of bed and did not realize until morning time I recommend she reduce her Ambien Ambien down to 5 mg daily and also recommend that she consider in the next month or 2 tapering off of the medicine she will let us know how that goes this could help reduce the risk of this happening plus also reduce the risk of dementia - TSH - T4, free - Hepatic function panel - CBC with Differential - Basic Metabolic Panel (BMET) - Ferritin - Iron Binding Cap (TIBC)([facility]) - Iron - Vitamin D (25 hydroxy) - B12 - B Nat Peptide  5. Other fatigue Significant fatigue check lab work await the results - TSH - T4, free - Hepatic function panel - CBC with Differential - Basic Metabolic Panel (BMET) - Ferritin - Iron Binding Cap (TIBC)([facility]) - Iron - Vitamin D (25 hydroxy) - B12 - B Nat Peptide  6. Vitamin D deficiency Vitamin D deficiency in the past check level may need adjustments may need supplementation - TSH - T4,  free - Hepatic function panel - CBC with Differential - Basic Metabolic Panel (BMET) - Ferritin - Iron Binding Cap (TIBC)([facility]) - Iron - Vitamin D (25 hydroxy) - B12 - B Nat Peptide  7. Persistent cough Persistent cough could be related to postnasal drip could be silent regurg could also be a pulmonary issue such as COPD chest x-ray necessary initiate Protonix initiate Flonase recheck in 1 month if ongoing troubles referral to pulmonary for pulmonary function testing and possible evaluation regarding possible COPD - DG Chest 2 View - TSH - T4, free - Hepatic function panel - CBC with Differential - Basic Metabolic Panel (BMET) - Ferritin - Iron Binding Cap (TIBC)([facility]) - Iron - Vitamin D (25 hydroxy) - B12 - B Nat Peptide  8. Gastroesophageal reflux disease without esophagitis Patient may be having silent regurg try Protonix see if this helps with the cough - TSH - T4, free - Hepatic function panel - CBC with Differential - Basic Metabolic Panel (BMET) - Ferritin - Iron Binding Cap (TIBC)([facility]) - Iron - Vitamin D (25 hydroxy) - B12 - B Nat Peptide 30 minutes spent with patient evaluating multiple different aspects.  Time was also spent reviewing her chart plus also documentation this totaled 30 minutes plus

## 2019-05-15 ENCOUNTER — Encounter: Payer: Self-pay | Admitting: Family Medicine

## 2019-05-15 ENCOUNTER — Telehealth: Payer: Self-pay | Admitting: Family Medicine

## 2019-05-15 ENCOUNTER — Encounter (HOSPITAL_COMMUNITY): Payer: Self-pay | Admitting: Emergency Medicine

## 2019-05-15 ENCOUNTER — Inpatient Hospital Stay (HOSPITAL_COMMUNITY)
Admission: EM | Admit: 2019-05-15 | Discharge: 2019-05-16 | DRG: 812 | Disposition: A | Discharge status: Home / Self Care | Payer: Federal, State, Local not specified - PPO | Attending: Internal Medicine | Admitting: Internal Medicine

## 2019-05-15 ENCOUNTER — Other Ambulatory Visit: Payer: Self-pay

## 2019-05-15 DIAGNOSIS — D509 Iron deficiency anemia, unspecified: Secondary | ICD-10-CM | POA: Diagnosis not present

## 2019-05-15 DIAGNOSIS — Z90710 Acquired absence of both cervix and uterus: Secondary | ICD-10-CM

## 2019-05-15 DIAGNOSIS — Z96653 Presence of artificial knee joint, bilateral: Secondary | ICD-10-CM | POA: Diagnosis not present

## 2019-05-15 DIAGNOSIS — Z803 Family history of malignant neoplasm of breast: Secondary | ICD-10-CM

## 2019-05-15 DIAGNOSIS — K269 Duodenal ulcer, unspecified as acute or chronic, without hemorrhage or perforation: Secondary | ICD-10-CM | POA: Diagnosis not present

## 2019-05-15 DIAGNOSIS — R7303 Prediabetes: Secondary | ICD-10-CM | POA: Diagnosis present

## 2019-05-15 DIAGNOSIS — G4733 Obstructive sleep apnea (adult) (pediatric): Secondary | ICD-10-CM | POA: Diagnosis not present

## 2019-05-15 DIAGNOSIS — Z8249 Family history of ischemic heart disease and other diseases of the circulatory system: Secondary | ICD-10-CM

## 2019-05-15 DIAGNOSIS — K259 Gastric ulcer, unspecified as acute or chronic, without hemorrhage or perforation: Secondary | ICD-10-CM | POA: Diagnosis present

## 2019-05-15 DIAGNOSIS — G2581 Restless legs syndrome: Secondary | ICD-10-CM | POA: Diagnosis not present

## 2019-05-15 DIAGNOSIS — I10 Essential (primary) hypertension: Secondary | ICD-10-CM | POA: Diagnosis present

## 2019-05-15 DIAGNOSIS — K449 Diaphragmatic hernia without obstruction or gangrene: Secondary | ICD-10-CM | POA: Diagnosis not present

## 2019-05-15 DIAGNOSIS — E559 Vitamin D deficiency, unspecified: Secondary | ICD-10-CM | POA: Diagnosis not present

## 2019-05-15 DIAGNOSIS — Z833 Family history of diabetes mellitus: Secondary | ICD-10-CM

## 2019-05-15 DIAGNOSIS — K219 Gastro-esophageal reflux disease without esophagitis: Secondary | ICD-10-CM | POA: Diagnosis not present

## 2019-05-15 DIAGNOSIS — Z888 Allergy status to other drugs, medicaments and biological substances status: Secondary | ICD-10-CM | POA: Diagnosis not present

## 2019-05-15 DIAGNOSIS — Z8349 Family history of other endocrine, nutritional and metabolic diseases: Secondary | ICD-10-CM

## 2019-05-15 DIAGNOSIS — K319 Disease of stomach and duodenum, unspecified: Secondary | ICD-10-CM | POA: Diagnosis present

## 2019-05-15 DIAGNOSIS — E039 Hypothyroidism, unspecified: Secondary | ICD-10-CM | POA: Diagnosis present

## 2019-05-15 DIAGNOSIS — Z823 Family history of stroke: Secondary | ICD-10-CM

## 2019-05-15 DIAGNOSIS — M1990 Unspecified osteoarthritis, unspecified site: Secondary | ICD-10-CM | POA: Diagnosis present

## 2019-05-15 DIAGNOSIS — Z8711 Personal history of peptic ulcer disease: Secondary | ICD-10-CM

## 2019-05-15 DIAGNOSIS — Z20822 Contact with and (suspected) exposure to covid-19: Secondary | ICD-10-CM | POA: Diagnosis not present

## 2019-05-15 DIAGNOSIS — R0602 Shortness of breath: Secondary | ICD-10-CM | POA: Diagnosis not present

## 2019-05-15 DIAGNOSIS — F419 Anxiety disorder, unspecified: Secondary | ICD-10-CM | POA: Diagnosis present

## 2019-05-15 DIAGNOSIS — K3189 Other diseases of stomach and duodenum: Secondary | ICD-10-CM | POA: Diagnosis not present

## 2019-05-15 DIAGNOSIS — Z79899 Other long term (current) drug therapy: Secondary | ICD-10-CM

## 2019-05-15 DIAGNOSIS — Z818 Family history of other mental and behavioral disorders: Secondary | ICD-10-CM

## 2019-05-15 DIAGNOSIS — D649 Anemia, unspecified: Secondary | ICD-10-CM | POA: Diagnosis present

## 2019-05-15 DIAGNOSIS — Z825 Family history of asthma and other chronic lower respiratory diseases: Secondary | ICD-10-CM

## 2019-05-15 LAB — CBC WITH DIFFERENTIAL/PLATELET
Abs Immature Granulocytes: 0.03 10*3/uL (ref 0.00–0.07)
Basophils Absolute: 0 10*3/uL (ref 0.0–0.2)
Basophils Absolute: 0 10*3/uL (ref 0.0–0.1)
Basophils Relative: 0 %
Basos: 0 %
EOS (ABSOLUTE): 0.1 10*3/uL (ref 0.0–0.4)
Eos: 1 %
Eosinophils Absolute: 0.1 10*3/uL (ref 0.0–0.5)
Eosinophils Relative: 1 %
HCT: 21.7 % — ABNORMAL LOW (ref 36.0–46.0)
Hematocrit: 21.6 % — ABNORMAL LOW (ref 34.0–46.6)
Hemoglobin: 5.5 g/dL — CL (ref 11.1–15.9)
Hemoglobin: 5.5 g/dL — CL (ref 12.0–15.0)
Immature Grans (Abs): 0 10*3/uL (ref 0.0–0.1)
Immature Granulocytes: 1 %
Immature Granulocytes: 0 %
Lymphocytes Absolute: 1.7 10*3/uL (ref 0.7–3.1)
Lymphocytes Relative: 24 %
Lymphs Abs: 1.8 10*3/uL (ref 0.7–4.0)
Lymphs: 24 %
MCH: 15.4 pg — ABNORMAL LOW (ref 26.6–33.0)
MCH: 15.7 pg — ABNORMAL LOW (ref 26.0–34.0)
MCHC: 25.5 g/dL — ABNORMAL LOW (ref 31.5–35.7)
MCHC: 25.3 g/dL — ABNORMAL LOW (ref 30.0–36.0)
MCV: 61 fL — ABNORMAL LOW (ref 79–97)
MCV: 61.8 fL — ABNORMAL LOW (ref 80.0–100.0)
Monocytes Absolute: 0.6 10*3/uL (ref 0.1–0.9)
Monocytes Absolute: 0.6 10*3/uL (ref 0.1–1.0)
Monocytes Relative: 8 %
Monocytes: 8 %
Neutro Abs: 4.9 10*3/uL (ref 1.7–7.7)
Neutrophils Absolute: 4.6 10*3/uL (ref 1.4–7.0)
Neutrophils Relative %: 67 %
Neutrophils: 66 %
Platelets: 277 10*3/uL (ref 150–450)
Platelets: 287 10*3/uL (ref 150–400)
RBC: 3.56 x10E6/uL — ABNORMAL LOW (ref 3.77–5.28)
RBC: 3.51 MIL/uL — ABNORMAL LOW (ref 3.87–5.11)
RDW: 18.4 % — ABNORMAL HIGH (ref 11.7–15.4)
RDW: 19.9 % — ABNORMAL HIGH (ref 11.5–15.5)
WBC: 7 10*3/uL (ref 3.4–10.8)
WBC: 7.5 10*3/uL (ref 4.0–10.5)
nRBC: 0.5 % — ABNORMAL HIGH (ref 0.0–0.2)

## 2019-05-15 LAB — ABO/RH: ABO/RH(D): O NEG

## 2019-05-15 LAB — BASIC METABOLIC PANEL
BUN/Creatinine Ratio: 14 (ref 12–28)
BUN: 13 mg/dL (ref 8–27)
CO2: 22 mmol/L (ref 20–29)
Calcium: 9.1 mg/dL (ref 8.7–10.3)
Chloride: 99 mmol/L (ref 96–106)
Creatinine, Ser: 0.94 mg/dL (ref 0.57–1.00)
GFR calc Af Amer: 75 mL/min/{1.73_m2} (ref >59)
GFR calc non Af Amer: 65 mL/min/{1.73_m2} (ref >59)
Glucose: 67 mg/dL (ref 65–99)
Potassium: 4.9 mmol/L (ref 3.5–5.2)
Sodium: 139 mmol/L (ref 134–144)

## 2019-05-15 LAB — COMPREHENSIVE METABOLIC PANEL
ALT: 16 U/L (ref 0–44)
AST: 17 U/L (ref 15–41)
Albumin: 3.9 g/dL (ref 3.5–5.0)
Alkaline Phosphatase: 97 U/L (ref 38–126)
Anion gap: 10 (ref 5–15)
BUN: 16 mg/dL (ref 8–23)
CO2: 25 mmol/L (ref 22–32)
Calcium: 9.2 mg/dL (ref 8.9–10.3)
Chloride: 103 mmol/L (ref 98–111)
Creatinine, Ser: 0.87 mg/dL (ref 0.44–1.00)
GFR calc Af Amer: >60 mL/min (ref >60)
GFR calc non Af Amer: >60 mL/min (ref >60)
Glucose, Bld: 110 mg/dL — ABNORMAL HIGH (ref 70–99)
Potassium: 4.2 mmol/L (ref 3.5–5.1)
Sodium: 138 mmol/L (ref 135–145)
Total Bilirubin: 0.5 mg/dL (ref 0.3–1.2)
Total Protein: 6.9 g/dL (ref 6.5–8.1)

## 2019-05-15 LAB — HEPATIC FUNCTION PANEL
ALT: 9 IU/L (ref 0–32)
AST: 19 IU/L (ref 0–40)
Albumin: 4.2 g/dL (ref 3.8–4.8)
Alkaline Phosphatase: 111 IU/L (ref 39–117)
Bilirubin Total: 0.3 mg/dL (ref 0.0–1.2)
Bilirubin, Direct: 0.09 mg/dL (ref 0.00–0.40)
Total Protein: 6.7 g/dL (ref 6.0–8.5)

## 2019-05-15 LAB — IRON AND TIBC
Iron Saturation: 2 % — CL (ref 15–55)
Iron: 13 ug/dL — ABNORMAL LOW (ref 27–139)
Total Iron Binding Capacity: 528 ug/dL — ABNORMAL HIGH (ref 250–450)
UIBC: 515 ug/dL — ABNORMAL HIGH (ref 118–369)

## 2019-05-15 LAB — BRAIN NATRIURETIC PEPTIDE: BNP: 36 pg/mL (ref 0.0–100.0)

## 2019-05-15 LAB — FERRITIN: Ferritin: 6 ng/mL — ABNORMAL LOW (ref 15–150)

## 2019-05-15 LAB — PREPARE RBC (CROSSMATCH)

## 2019-05-15 LAB — TSH: TSH: 2.15 u[IU]/mL (ref 0.450–4.500)

## 2019-05-15 LAB — VITAMIN B12: Vitamin B-12: 267 pg/mL (ref 232–1245)

## 2019-05-15 LAB — VITAMIN D 25 HYDROXY (VIT D DEFICIENCY, FRACTURES): Vit D, 25-Hydroxy: 45.4 ng/mL (ref 30.0–100.0)

## 2019-05-15 LAB — T4, FREE: Free T4: 1.26 ng/dL (ref 0.82–1.77)

## 2019-05-15 MED ORDER — ONDANSETRON HCL 4 MG PO TABS: 4.0000 mg | ORAL_TABLET | Freq: Four times a day (QID) | ORAL | Status: DC | PRN

## 2019-05-15 MED ORDER — ALPRAZOLAM 0.5 MG PO TABS: 0.5000 mg | ORAL_TABLET | Freq: Two times a day (BID) | ORAL | Status: DC | PRN

## 2019-05-15 MED ORDER — POLYETHYLENE GLYCOL 3350 17 G PO PACK: 17.0000 g | PACK | Freq: Every day | ORAL | Status: DC | PRN

## 2019-05-15 MED ORDER — ACETAMINOPHEN 325 MG PO TABS: 650.0000 mg | ORAL_TABLET | Freq: Four times a day (QID) | ORAL | Status: DC | PRN

## 2019-05-15 MED ORDER — PANTOPRAZOLE SODIUM 40 MG IV SOLR
40.0000 mg | INTRAVENOUS | Status: DC
  Administered 2019-05-15: 40 mg via INTRAVENOUS
  Filled 2019-05-15: qty 40

## 2019-05-15 MED ORDER — ROPINIROLE HCL 1 MG PO TABS
4.0000 mg | ORAL_TABLET | Freq: Every day | ORAL | Status: DC
  Administered 2019-05-15: 4 mg via ORAL
  Filled 2019-05-15 (×2): qty 4

## 2019-05-15 MED ORDER — ZOLPIDEM TARTRATE 5 MG PO TABS
5.0000 mg | ORAL_TABLET | Freq: Every day | ORAL | Status: DC
  Administered 2019-05-15: 5 mg via ORAL
  Filled 2019-05-15: qty 1

## 2019-05-15 MED ORDER — ACETAMINOPHEN 650 MG RE SUPP: 650.0000 mg | Freq: Four times a day (QID) | RECTAL | Status: DC | PRN

## 2019-05-15 MED ORDER — ONDANSETRON HCL 4 MG/2ML IJ SOLN: 4.0000 mg | Freq: Four times a day (QID) | INTRAMUSCULAR | Status: DC | PRN

## 2019-05-15 MED ORDER — FLUTICASONE PROPIONATE 50 MCG/ACT NA SUSP
2.0000 | Freq: Every day | NASAL | Status: DC
  Administered 2019-05-15 – 2019-05-16 (×2): 2 via NASAL
  Filled 2019-05-15: qty 16

## 2019-05-15 MED ORDER — SODIUM CHLORIDE 0.9 % IV SOLN: 10.0000 mL/h | Freq: Once | INTRAVENOUS | Status: DC

## 2019-05-15 NOTE — H&P (Signed)
History and Physical    Karen Vazquez [id number] DOB: [birth year] DOA: 05/15/2019  PCP: Kathyrn Drown, MD   Patient coming from: Home  I have personally briefly reviewed patient's old medical records in [place]  Chief Complaint: Low Hemoglobin  HPI: Karen Vazquez is a 64 y.o. female with medical history significant for iron deficiency anemia, hypertension, obstructive sleep apnea, hypothyroidism.  Presents to the ED with reports of low hemoglobin of 5.5.  She has a history of iron deficiency anemia for which she has had iron transfusions in the past, she has also had blood transfusions- 2011, but that was in the in the setting of knee surgery.  She reports NSAID use with ibuprofen 1-2 times a week, 400mg . She reports mild difficulty breathing with exertion, no chest pain.  She denies vomiting of blood, no bloody stools, no black stools.  She is supposed to be on iron tablets but she has not entirely been consistent with it.  She has a problem iron absorption from her diet.  ED Course: Heart rate 101, blood pressure 128/60.  Hemoglobin 5.5.  Marked reduction in RBC indices across board.  Two-view chest x-ray obtained yesterday showed moderate hiatal hernia otherwise without acute abnormality.  Needs PRBC ordered for transfusion in the ED.  Hospitalist to admit for acute symptomatic anemia.  Review of Systems: As per HPI all other systems reviewed and negative.  Past Medical History:  Diagnosis Date  . Anemia   . Anxiety   . Arthritis   . Back pain   . Blood transfusion 2011   at [facility]  . Bulging lumbar disc   . Chest pain   . Depression   . Dry mouth   . Easy bruising   . Excessive thirst   . Fatigue   . Frequent urination   . GERD (gastroesophageal reflux disease)   . Headache   . Heat intolerance   . Hypertension   . Hypothyroidism   . Joint pain   . Leg pain   . Muscle stiffness   . Nervousness   . Palpitations   . Pre-diabetes   . Restless leg syndrome    . Shortness of breath on exertion   . Sleep apnea    does not use c-pap machine  . Stomach ulcer   . Stress   . Swelling of both lower extremities   . Trouble in sleeping   . Vitamin D deficiency   . Weakness     Past Surgical History:  Procedure Laterality Date  . APPENDECTOMY    . BLADDER SUSPENSION  12/29/2010   Procedure: TRANSVAGINAL TAPE (TVT) PROCEDURE;  Surgeon: Daria Pastures;  Location: [facility];  Service: Gynecology;  Laterality: N/A;  . BREAST SURGERY     left breast lumpectomy 1977  . COLONOSCOPY N/A 03/03/2016   Procedure: COLONOSCOPY;  Surgeon: Rogene Houston, MD;  [facility];  Service: Endoscopy;  Laterality: N/A;  2:00 - moved to 12/14 @ 12:55 - Ann notified pt  . CYSTOCELE REPAIR  12/29/2010   Procedure: ANTERIOR REPAIR (CYSTOCELE);  Surgeon: Daria Pastures;  Location: [facility];  Service: Gynecology;  Laterality: N/A;  . CYSTOSCOPY  12/29/2010   Procedure: CYSTOSCOPY;  Surgeon: Daria Pastures;  Location: [facility];  Service: Gynecology;  Laterality: N/A;  . JOINT REPLACEMENT  2011   rt knee  . REPLACEMENT TOTAL KNEE Right 2011  . TONSILLECTOMY    . TOTAL KNEE ARTHROPLASTY Left 11/17/2014  Procedure: LEFT TOTAL KNEE ARTHROPLASTY;  Surgeon: Gaynelle Arabian, MD;  Location: [facility];  Service: Orthopedics;  Laterality: Left;  . TUBAL LIGATION    . VAGINAL HYSTERECTOMY  12/29/2010   Procedure: HYSTERECTOMY VAGINAL;  Surgeon: Daria Pastures;  Location: [facility];  Service: Gynecology;  Laterality: N/A;     reports that she has never smoked. She has never used smokeless tobacco. She reports that she does not drink alcohol or use drugs.  Allergies  Allergen Reactions  . Lisinopril Cough    Family History  Problem Relation Age of Onset  . Stroke Mother   . Hypertension Mother   . Hyperlipidemia Mother   . Thyroid disease Mother   . Heart disease Father   . COPD Father   . Cancer Father   . Depression Father   . Cancer Paternal  Grandmother        breast  . Diabetes Paternal Grandfather     Prior to Admission medications   Medication Sig Start Date End Date Taking? Authorizing Provider  albuterol (PROVENTIL HFA;VENTOLIN HFA) 108 (90 Base) MCG/ACT inhaler Inhale 2 puffs into the lungs every 6 (six) hours as needed for wheezing or shortness of breath. 03/02/17   Mikey Kirschner, MD  ALPRAZolam Duanne Moron) 0.5 MG tablet TAKE ONE TABLET BY MOUTH TWO TIMES DAILYAS NEEDED FOR ANXIETY OR SLEEP. 06/28/18   Kathyrn Drown, MD  amLODipine (NORVASC) 2.5 MG tablet Take 1 tablet (2.5 mg total) by mouth daily. 02/06/19   Kathyrn Drown, MD  ARMOUR THYROID 15 MG tablet TAKE ONE (1) TABLET EACH DAY 02/06/19   Kathyrn Drown, MD  ergocalciferol (VITAMIN D2) 1.25 MG (50000 UT) capsule Take 1 capsule (50,000 Units total) by mouth once a week. 02/06/19   Kathyrn Drown, MD  fluticasone (FLONASE) 50 MCG/ACT nasal spray Place 2 sprays into both nostrils daily. 05/14/19   Kathyrn Drown, MD  pantoprazole (PROTONIX) 40 MG tablet Take 1 tablet (40 mg total) by mouth daily. 05/14/19   Kathyrn Drown, MD  Pediatric Multiple Vit-C-FA (FLINSTONES GUMMIES OMEGA-3 DHA PO) Take 1 tablet by mouth daily.    [provider]  pregabalin (LYRICA) 75 MG capsule  04/09/19   [provider]  rOPINIRole (REQUIP) 4 MG tablet TAEK ONE TABLET (4MG  TOTAL) BY MOUTH EVERY EVENING 01/29/19   Kathyrn Drown, MD  thyroid (ARMOUR THYROID) 30 MG tablet TAKE ONE (1) TABLET BY MOUTH EVERY DAY 02/06/19   Kathyrn Drown, MD  zolpidem (AMBIEN) 5 MG tablet Take 1 tablet (5 mg total) by mouth at bedtime as needed for sleep. 05/14/19   Kathyrn Drown, MD    Physical Exam: Vitals:   05/15/19 1619  BP: 128/60  Pulse: (!) 101  Resp: 18  Temp: 98.1 F (36.7 C)  TempSrc: Oral  SpO2: 96%  Weight: 110.7 kg  Height: 5\' 4"  (1.626 m)    Constitutional: NAD, calm, comfortable Vitals:   05/15/19 1619  BP: 128/60  Pulse: (!) 101  Resp: 18  Temp: 98.1 F  (36.7 C)  TempSrc: Oral  SpO2: 96%  Weight: 110.7 kg  Height: 5\' 4"  (1.626 m)   Eyes: PERRL, lids and conjunctivae normal ENMT: Mucous membranes are moist. Posterior pharynx clear of any exudate or lesions. Neck: normal, supple, no masses, no thyromegaly Respiratory:  Normal respiratory effort. No accessory muscle use.  Cardiovascular: Regular rate and rhythm,. No extremity edema. 2+ pedal pulses. Abdomen: no tenderness, no masses palpated. No hepatosplenomegaly.  Bowel sounds positive.  Musculoskeletal: no clubbing / cyanosis. No joint deformity upper and lower extremities. Good ROM, no contractures. Normal muscle tone.  Skin: no rashes, lesions, ulcers. No induration Neurologic: No apparent cranial nerve abnormality, moving all extremities spontaneously. Psychiatric: Normal judgment and insight. Alert and oriented x 3. Normal mood.   Labs on Admission: I have personally reviewed following labs and imaging studies  CBC: Recent Labs  Lab 05/14/19 1154 05/15/19 1736  WBC 7.0 7.5  NEUTROABS 4.6 4.9  HGB 5.5* 5.5*  HCT 21.6* 21.7*  MCV 61* 61.8*  PLT 277 [id number]   Basic Metabolic Panel: Recent Labs  Lab 05/14/19 1154 05/15/19 1736  NA 139 138  K 4.9 4.2  CL 99 103  CO2 22 25  GLUCOSE 67 110*  BUN 13 16  CREATININE 0.94 0.87  CALCIUM 9.1 9.2   Liver Function Tests: Recent Labs  Lab 05/14/19 1154 05/15/19 1736  AST 19 17  ALT 9 16  ALKPHOS 111 97  BILITOT 0.3 0.5  PROT 6.7 6.9  ALBUMIN 4.2 3.9   Thyroid Function Tests: Recent Labs    05/14/19 1154  TSH 2.150  FREET4 1.26   Anemia Panel: Recent Labs    05/14/19 1154  VITAMINB12 267  FERRITIN 6*  TIBC 528*  IRON 13*   Urine analysis:    Component Value Date/Time   COLORURINE YELLOW 11/10/2014 0850   APPEARANCEUR CLEAR 11/10/2014 0850   LABSPEC 1.012 11/10/2014 0850   PHURINE 5.5 11/10/2014 0850   GLUCOSEU NEGATIVE 11/10/2014 0850   HGBUR NEGATIVE 11/10/2014 0850   BILIRUBINUR NEGATIVE 11/10/2014  0850   KETONESUR NEGATIVE 11/10/2014 0850   PROTEINUR NEGATIVE 11/10/2014 0850   UROBILINOGEN 0.2 11/10/2014 0850   NITRITE NEGATIVE 11/10/2014 0850   LEUKOCYTESUR NEGATIVE 11/10/2014 0850    Radiological Exams on Admission: DG Chest 2 View  Result Date: 05/14/2019 CLINICAL DATA:  Hacking cough. Shortness of breath. Negative COVID-19 test. EXAM: CHEST - 2 VIEW COMPARISON:  None. FINDINGS: There is a moderate hiatal hernia. The heart, hila, and mediastinum are normal. No pneumothorax. No nodules or mass. No focal infiltrates. IMPRESSION: Moderate hiatal hernia.  No acute abnormalities. Electronically Signed   By: Dorise Bullion III M.D   On: 05/14/2019 18:03    EKG: Pending  Assessment/Plan Active Problems:   Acute anemia   Acute anemia with history of iron deficiency-hemoglobin 5.5, baseline 2019 12-14.  Marked reduction red blood cell indices, HCT 21, MCV 61, MCH 15, MCHC 15, increased RDW 19.  Iron studies yesterday show marked low iron saturation of 2, low ferritin of 6, serum iron of 13, TIBC increased at 528.  Doubt active GI blood loss.  Reports NSAID use.   - Last colonoscopy 2017-polyp and external hemorrhoids (pathology results tubular adenoma, no high-grade dysplasia or malignancy. -2 units PRBC ordered for transfusion -May benefit from a 3rd unit of PRBC prior to discharge as she has no iron stores - Would benefit from iron transfusion prior to discharge - CBC a.m. -N.p.o. midnight -GI consult  -IV Protonix 40 daily.  Restless leg syndrome-likely from iron deficiency -Pending med reconciliation, resume ropinirole, pregabalin  Hypertension-stable -Med reconciliation pending  Hypothyroidism -Pending med reconciliation, resume thyroid supplements  DVT prophylaxis: SCDs Code Status: Full code Family Communication: None at bedside Disposition Plan: 1 to 2 days Consults called: Gastroenterology Admission status: Observation, telemetry   Bethena Roys  MD [facility]  05/15/2019, 8:19 PM

## 2019-05-15 NOTE — ED Provider Notes (Addendum)
[facility] EMERGENCY DEPARTMENT Provider Note   CSN: [id number] Arrival date & time: 05/15/19  1613     History Chief Complaint  Patient presents with  . Abnormal Lab    Karen Vazquez is a 64 y.o. female.  Patient sent in from Dr. Yehuda Mao office.  For low hemoglobin.  Came in at 5.5.  Patient has a history of anemia felt to be iron deficiency anemia.  Patient has been feeling a little fatigued since her last Covid vaccination which was February 14.  She had a [place] vaccine.  Denies any blood in her bowel movements.  No significant shortness of breath.  Oxygen saturation 96%.  But patient did say she did feel slightly short of breath.        Past Medical History:  Diagnosis Date  . Anemia   . Anxiety   . Arthritis   . Back pain   . Blood transfusion 2011   at [facility]  . Bulging lumbar disc   . Chest pain   . Depression   . Dry mouth   . Easy bruising   . Excessive thirst   . Fatigue   . Frequent urination   . GERD (gastroesophageal reflux disease)   . Headache   . Heat intolerance   . Hypertension   . Hypothyroidism   . Joint pain   . Leg pain   . Muscle stiffness   . Nervousness   . Palpitations   . Pre-diabetes   . Restless leg syndrome   . Shortness of breath on exertion   . Sleep apnea    does not use c-pap machine  . Stomach ulcer   . Stress   . Swelling of both lower extremities   . Trouble in sleeping   . Vitamin D deficiency   . Weakness     Patient Active Problem List   Diagnosis Date Noted  . Morbid obesity ([place]) 01/02/2017  . History of colonic polyps 04/26/2016  . OA (osteoarthritis) of knee 11/17/2014  . HTN (hypertension) 05/28/2014  . Obstructive sleep apnea 05/28/2014  . Anemia, iron deficiency 12/16/2013  . Prediabetes 05/01/2013  . GERD (gastroesophageal reflux disease) 02/06/2013  . Hypothyroidism 12/12/2012  . Insomnia 12/12/2012  . Hyperlipidemia 12/12/2012  . Osteoarthritis 12/12/2012  . Restless legs syndrome 12/12/2012     Past Surgical History:  Procedure Laterality Date  . APPENDECTOMY    . BLADDER SUSPENSION  12/29/2010   Procedure: TRANSVAGINAL TAPE (TVT) PROCEDURE;  Surgeon: Daria Pastures;  Location: [facility];  Service: Gynecology;  Laterality: N/A;  . BREAST SURGERY     left breast lumpectomy 1977  . COLONOSCOPY N/A 03/03/2016   Procedure: COLONOSCOPY;  Surgeon: Rogene Houston, MD;  [facility];  Service: Endoscopy;  Laterality: N/A;  2:00 - moved to 12/14 @ 12:55 - Ann notified pt  . CYSTOCELE REPAIR  12/29/2010   Procedure: ANTERIOR REPAIR (CYSTOCELE);  Surgeon: Daria Pastures;  Location: [facility];  Service: Gynecology;  Laterality: N/A;  . CYSTOSCOPY  12/29/2010   Procedure: CYSTOSCOPY;  Surgeon: Daria Pastures;  Location: [facility];  Service: Gynecology;  Laterality: N/A;  . JOINT REPLACEMENT  2011   rt knee  . REPLACEMENT TOTAL KNEE Right 2011  . TONSILLECTOMY    . TOTAL KNEE ARTHROPLASTY Left 11/17/2014   Procedure: LEFT TOTAL KNEE ARTHROPLASTY;  Surgeon: Gaynelle Arabian, MD;  Location: [facility];  Service: Orthopedics;  Laterality: Left;  . TUBAL LIGATION    .  VAGINAL HYSTERECTOMY  12/29/2010   Procedure: HYSTERECTOMY VAGINAL;  Surgeon: Daria Pastures;  Location: [facility];  Service: Gynecology;  Laterality: N/A;     OB History    Gravida  2   Para  2   Term  2   Preterm      AB      Living  2     SAB      TAB      Ectopic      Multiple      Live Births              Family History  Problem Relation Age of Onset  . Stroke Mother   . Hypertension Mother   . Hyperlipidemia Mother   . Thyroid disease Mother   . Heart disease Father   . COPD Father   . Cancer Father   . Depression Father   . Cancer Paternal Grandmother        breast  . Diabetes Paternal Grandfather     Social History   Tobacco Use  . Smoking status: Never Smoker  . Smokeless tobacco: Never Used  Substance Use Topics  . Alcohol use: No  . Drug use: No    Home  Medications Prior to Admission medications   Medication Sig Start Date End Date Taking? Authorizing Provider  albuterol (PROVENTIL HFA;VENTOLIN HFA) 108 (90 Base) MCG/ACT inhaler Inhale 2 puffs into the lungs every 6 (six) hours as needed for wheezing or shortness of breath. 03/02/17   Mikey Kirschner, MD  ALPRAZolam Duanne Moron) 0.5 MG tablet TAKE ONE TABLET BY MOUTH TWO TIMES DAILYAS NEEDED FOR ANXIETY OR SLEEP. 06/28/18   Kathyrn Drown, MD  amLODipine (NORVASC) 2.5 MG tablet Take 1 tablet (2.5 mg total) by mouth daily. 02/06/19   Kathyrn Drown, MD  ARMOUR THYROID 15 MG tablet TAKE ONE (1) TABLET EACH DAY 02/06/19   Kathyrn Drown, MD  ergocalciferol (VITAMIN D2) 1.25 MG (50000 UT) capsule Take 1 capsule (50,000 Units total) by mouth once a week. 02/06/19   Kathyrn Drown, MD  fluticasone (FLONASE) 50 MCG/ACT nasal spray Place 2 sprays into both nostrils daily. 05/14/19   Kathyrn Drown, MD  pantoprazole (PROTONIX) 40 MG tablet Take 1 tablet (40 mg total) by mouth daily. 05/14/19   Kathyrn Drown, MD  Pediatric Multiple Vit-C-FA (FLINSTONES GUMMIES OMEGA-3 DHA PO) Take 1 tablet by mouth daily.    [provider]  pregabalin (LYRICA) 75 MG capsule  04/09/19   [provider]  rOPINIRole (REQUIP) 4 MG tablet TAEK ONE TABLET (4MG  TOTAL) BY MOUTH EVERY EVENING 01/29/19   Kathyrn Drown, MD  thyroid (ARMOUR THYROID) 30 MG tablet TAKE ONE (1) TABLET BY MOUTH EVERY DAY 02/06/19   Kathyrn Drown, MD  zolpidem (AMBIEN) 5 MG tablet Take 1 tablet (5 mg total) by mouth at bedtime as needed for sleep. 05/14/19   Kathyrn Drown, MD    Allergies    Lisinopril  Review of Systems   Review of Systems  Constitutional: Positive for fatigue. Negative for chills and fever.  HENT: Negative for congestion, rhinorrhea and sore throat.   Eyes: Negative for visual disturbance.  Respiratory: Positive for shortness of breath. Negative for cough.   Cardiovascular: Negative for chest pain and leg  swelling.  Gastrointestinal: Negative for abdominal pain, blood in stool, diarrhea, nausea and vomiting.  Genitourinary: Negative for dysuria.  Musculoskeletal: Negative for back pain and neck pain.  Skin: Negative for rash.  Neurological: Negative for dizziness, light-headedness and headaches.  Hematological: Does not bruise/bleed easily.  Psychiatric/Behavioral: Negative for confusion.    Physical Exam Updated Vital Signs BP 128/60 (BP Location: Right Arm)   Pulse (!) 101   Temp 98.1 F (36.7 C) (Oral)   Resp 18   Ht 1.626 m (5\' 4" )   Wt 110.7 kg   LMP 05/24/2010   SpO2 96%   BMI 41.88 kg/m   Physical Exam Vitals and nursing note reviewed.  Constitutional:      General: She is not in acute distress.    Appearance: Normal appearance. She is well-developed.  HENT:     Head: Normocephalic and atraumatic.  Eyes:     Conjunctiva/sclera: Conjunctivae normal.     Pupils: Pupils are equal, round, and reactive to light.  Cardiovascular:     Rate and Rhythm: Normal rate and regular rhythm.     Heart sounds: No murmur.  Pulmonary:     Effort: Pulmonary effort is normal. No respiratory distress.     Breath sounds: Normal breath sounds.  Abdominal:     Palpations: Abdomen is soft.     Tenderness: There is no abdominal tenderness.  Musculoskeletal:        General: No swelling.     Cervical back: Normal range of motion and neck supple.  Skin:    General: Skin is warm and dry.     Coloration: Skin is pale.  Neurological:     General: No focal deficit present.     Mental Status: She is alert and oriented to person, place, and time.     ED Results / Procedures / Treatments   Labs (all labs ordered are listed, but only abnormal results are displayed) Labs Reviewed  CBC WITH DIFFERENTIAL/PLATELET - Abnormal; Notable for the following components:      Result Value   RBC 3.51 (*)    Hemoglobin 5.5 (*)    HCT 21.7 (*)    MCV 61.8 (*)    MCH 15.7 (*)    MCHC 25.3 (*)     RDW 19.9 (*)    nRBC 0.5 (*)    All other components within normal limits  COMPREHENSIVE METABOLIC PANEL - Abnormal; Notable for the following components:   Glucose, Bld 110 (*)    All other components within normal limits  TYPE AND SCREEN  PREPARE RBC (CROSSMATCH)    EKG None  Radiology DG Chest 2 View  Result Date: 05/14/2019 CLINICAL DATA:  Hacking cough. Shortness of breath. Negative COVID-19 test. EXAM: CHEST - 2 VIEW COMPARISON:  None. FINDINGS: There is a moderate hiatal hernia. The heart, hila, and mediastinum are normal. No pneumothorax. No nodules or mass. No focal infiltrates. IMPRESSION: Moderate hiatal hernia.  No acute abnormalities. Electronically Signed   By: Dorise Bullion III M.D   On: 05/14/2019 18:03    Procedures Procedures (including critical care time)  CRITICAL CARE Performed by: Fredia Sorrow Total critical care time: 30 minutes Critical care time was exclusive of separately billable procedures and treating other patients. Critical care was necessary to treat or prevent imminent or life-threatening deterioration. Critical care was time spent personally by me on the following activities: development of treatment plan with patient and/or surrogate as well as nursing, discussions with consultants, evaluation of patient's response to treatment, examination of patient, obtaining history from patient or surrogate, ordering and performing treatments and interventions, ordering and review of laboratory studies, ordering and review of radiographic  studies, pulse oximetry and re-evaluation of patient's condition.  Medications Ordered in ED Medications  0.9 %  sodium chloride infusion (has no administration in time range)    ED Course  I have reviewed the triage vital signs and the nursing notes.  Pertinent labs & imaging results that were available during my care of the patient were reviewed by me and considered in my medical decision making (see chart for  details).    MDM Rules/Calculators/A&P                      Hemoglobin 5.5.  Will order 2 units of blood transfusion.  And arrange admission.  Patient with anemia with some symptoms.  Patient nontoxic no acute distress.  Oxygen saturations are good.  Blood pressure is fine.     Final Clinical Impression(s) / ED Diagnoses Final diagnoses:  Symptomatic anemia    Rx / DC Orders ED Discharge Orders    None       Fredia Sorrow, MD 05/15/19 [id number]    Fredia Sorrow, MD 05/15/19 [phone]

## 2019-05-15 NOTE — ED Triage Notes (Signed)
Patient sent by PCP for transfusion. Hgb 5.5 per patient report.

## 2019-05-15 NOTE — Telephone Encounter (Signed)
Patient was spoken with GI was spoken with patient was sent to ED

## 2019-05-15 NOTE — Telephone Encounter (Signed)
[facility] called with critical labs for patient. Pt had labs drawn yesterday 05/14/2019. Pts hemoglobin was 5.5. Wrote lab value down on sticky note and informed provider. Please advise. Thank you

## 2019-05-15 NOTE — ED Notes (Signed)
CRITICAL VALUE ALERT  Critical Value:  5.5  Date & Time Notied:    Provider Notified: Rogene Houston, MD   Orders Received/Actions taken: none

## 2019-05-16 ENCOUNTER — Encounter (HOSPITAL_COMMUNITY): Payer: Self-pay | Admitting: Internal Medicine

## 2019-05-16 ENCOUNTER — Encounter (HOSPITAL_COMMUNITY)
Admission: EM | Disposition: A | Discharge status: Home / Self Care | Payer: Self-pay | Source: Home / Self Care | Attending: Internal Medicine

## 2019-05-16 DIAGNOSIS — D649 Anemia, unspecified: Secondary | ICD-10-CM | POA: Diagnosis not present

## 2019-05-16 DIAGNOSIS — Z79899 Other long term (current) drug therapy: Secondary | ICD-10-CM | POA: Diagnosis not present

## 2019-05-16 DIAGNOSIS — Z818 Family history of other mental and behavioral disorders: Secondary | ICD-10-CM | POA: Diagnosis not present

## 2019-05-16 DIAGNOSIS — G2581 Restless legs syndrome: Secondary | ICD-10-CM | POA: Diagnosis present

## 2019-05-16 DIAGNOSIS — K3189 Other diseases of stomach and duodenum: Secondary | ICD-10-CM | POA: Diagnosis not present

## 2019-05-16 DIAGNOSIS — Z833 Family history of diabetes mellitus: Secondary | ICD-10-CM | POA: Diagnosis not present

## 2019-05-16 DIAGNOSIS — K449 Diaphragmatic hernia without obstruction or gangrene: Secondary | ICD-10-CM | POA: Diagnosis not present

## 2019-05-16 DIAGNOSIS — Z90710 Acquired absence of both cervix and uterus: Secondary | ICD-10-CM | POA: Diagnosis not present

## 2019-05-16 DIAGNOSIS — M1990 Unspecified osteoarthritis, unspecified site: Secondary | ICD-10-CM | POA: Diagnosis present

## 2019-05-16 DIAGNOSIS — F419 Anxiety disorder, unspecified: Secondary | ICD-10-CM | POA: Diagnosis present

## 2019-05-16 DIAGNOSIS — Z803 Family history of malignant neoplasm of breast: Secondary | ICD-10-CM | POA: Diagnosis not present

## 2019-05-16 DIAGNOSIS — Z96653 Presence of artificial knee joint, bilateral: Secondary | ICD-10-CM | POA: Diagnosis present

## 2019-05-16 DIAGNOSIS — Z888 Allergy status to other drugs, medicaments and biological substances status: Secondary | ICD-10-CM | POA: Diagnosis not present

## 2019-05-16 DIAGNOSIS — G4733 Obstructive sleep apnea (adult) (pediatric): Secondary | ICD-10-CM | POA: Diagnosis present

## 2019-05-16 DIAGNOSIS — E559 Vitamin D deficiency, unspecified: Secondary | ICD-10-CM | POA: Diagnosis present

## 2019-05-16 DIAGNOSIS — Z20822 Contact with and (suspected) exposure to covid-19: Secondary | ICD-10-CM | POA: Diagnosis present

## 2019-05-16 DIAGNOSIS — K319 Disease of stomach and duodenum, unspecified: Secondary | ICD-10-CM | POA: Diagnosis present

## 2019-05-16 DIAGNOSIS — R7303 Prediabetes: Secondary | ICD-10-CM | POA: Diagnosis present

## 2019-05-16 DIAGNOSIS — K219 Gastro-esophageal reflux disease without esophagitis: Secondary | ICD-10-CM | POA: Diagnosis present

## 2019-05-16 DIAGNOSIS — E039 Hypothyroidism, unspecified: Secondary | ICD-10-CM | POA: Diagnosis present

## 2019-05-16 DIAGNOSIS — Z8249 Family history of ischemic heart disease and other diseases of the circulatory system: Secondary | ICD-10-CM | POA: Diagnosis not present

## 2019-05-16 DIAGNOSIS — D509 Iron deficiency anemia, unspecified: Secondary | ICD-10-CM | POA: Diagnosis not present

## 2019-05-16 DIAGNOSIS — I10 Essential (primary) hypertension: Secondary | ICD-10-CM | POA: Diagnosis present

## 2019-05-16 DIAGNOSIS — Z8349 Family history of other endocrine, nutritional and metabolic diseases: Secondary | ICD-10-CM | POA: Diagnosis not present

## 2019-05-16 DIAGNOSIS — K269 Duodenal ulcer, unspecified as acute or chronic, without hemorrhage or perforation: Secondary | ICD-10-CM | POA: Diagnosis not present

## 2019-05-16 DIAGNOSIS — Z823 Family history of stroke: Secondary | ICD-10-CM | POA: Diagnosis not present

## 2019-05-16 DIAGNOSIS — Z825 Family history of asthma and other chronic lower respiratory diseases: Secondary | ICD-10-CM | POA: Diagnosis not present

## 2019-05-16 HISTORY — PX: BIOPSY: SHX5522

## 2019-05-16 HISTORY — PX: ESOPHAGOGASTRODUODENOSCOPY: SHX5428

## 2019-05-16 LAB — HEMOGLOBIN AND HEMATOCRIT, BLOOD
HCT: 31 % — ABNORMAL LOW (ref 36.0–46.0)
Hemoglobin: 8.6 g/dL — ABNORMAL LOW (ref 12.0–15.0)

## 2019-05-16 LAB — CBC
HCT: 26.2 % — ABNORMAL LOW (ref 36.0–46.0)
Hemoglobin: 7.1 g/dL — ABNORMAL LOW (ref 12.0–15.0)
MCH: 19 pg — ABNORMAL LOW (ref 26.0–34.0)
MCHC: 27.1 g/dL — ABNORMAL LOW (ref 30.0–36.0)
MCV: 70.1 fL — ABNORMAL LOW (ref 80.0–100.0)
Platelets: 222 10*3/uL (ref 150–400)
RBC: 3.74 MIL/uL — ABNORMAL LOW (ref 3.87–5.11)
RDW: 27.4 % — ABNORMAL HIGH (ref 11.5–15.5)
WBC: 5.7 10*3/uL (ref 4.0–10.5)
nRBC: 0.9 % — ABNORMAL HIGH (ref 0.0–0.2)

## 2019-05-16 LAB — HIV ANTIBODY (ROUTINE TESTING W REFLEX): HIV Screen 4th Generation wRfx: NONREACTIVE

## 2019-05-16 LAB — OCCULT BLOOD X 1 CARD TO LAB, STOOL: Fecal Occult Bld: NEGATIVE

## 2019-05-16 LAB — SARS CORONAVIRUS 2 (TAT 6-24 HRS): SARS Coronavirus 2: NEGATIVE

## 2019-05-16 LAB — PREPARE RBC (CROSSMATCH)

## 2019-05-16 SURGERY — EGD (ESOPHAGOGASTRODUODENOSCOPY)
Anesthesia: Moderate Sedation

## 2019-05-16 MED ORDER — STERILE WATER FOR IRRIGATION IR SOLN
Status: DC | PRN
  Administered 2019-05-16: 14:00:00 1.5 mL

## 2019-05-16 MED ORDER — PREGABALIN 75 MG PO CAPS: 75.0000 mg | ORAL_CAPSULE | Freq: Every day | ORAL | Status: DC

## 2019-05-16 MED ORDER — LIDOCAINE VISCOUS HCL 2 % MT SOLN
OROMUCOSAL | Status: AC
  Filled 2019-05-16: qty 15

## 2019-05-16 MED ORDER — MIDAZOLAM HCL 5 MG/5ML IJ SOLN
INTRAMUSCULAR | Status: DC | PRN
  Administered 2019-05-16 (×2): 2 mg via INTRAVENOUS
  Administered 2019-05-16: 1 mg via INTRAVENOUS
  Administered 2019-05-16: 2 mg via INTRAVENOUS

## 2019-05-16 MED ORDER — LACTATED RINGERS IV SOLN: INTRAVENOUS | Status: DC

## 2019-05-16 MED ORDER — MEPERIDINE HCL 50 MG/ML IJ SOLN
INTRAMUSCULAR | Status: DC | PRN
  Administered 2019-05-16 (×2): 25 mg via INTRAVENOUS

## 2019-05-16 MED ORDER — THYROID 30 MG PO TABS: 30.0000 mg | ORAL_TABLET | Freq: Every day | ORAL | Status: DC

## 2019-05-16 MED ORDER — SODIUM CHLORIDE 0.9% IV SOLUTION: Freq: Once | INTRAVENOUS | Status: AC

## 2019-05-16 MED ORDER — PANTOPRAZOLE SODIUM 40 MG PO TBEC
40.0000 mg | DELAYED_RELEASE_TABLET | Freq: Two times a day (BID) | ORAL | Status: DC
  Administered 2019-05-16: 40 mg via ORAL
  Filled 2019-05-16: qty 1

## 2019-05-16 MED ORDER — MEPERIDINE HCL 50 MG/ML IJ SOLN
INTRAMUSCULAR | Status: AC
  Filled 2019-05-16: qty 1

## 2019-05-16 MED ORDER — SODIUM CHLORIDE 0.9 % IV SOLN: INTRAVENOUS | Status: DC

## 2019-05-16 MED ORDER — LIDOCAINE VISCOUS HCL 2 % MT SOLN
OROMUCOSAL | Status: DC | PRN
  Administered 2019-05-16: 4 mL via OROMUCOSAL

## 2019-05-16 MED ORDER — SODIUM CHLORIDE 0.9 % IV SOLN
510.0000 mg | Freq: Once | INTRAVENOUS | Status: AC
  Administered 2019-05-16: 510 mg via INTRAVENOUS
  Filled 2019-05-16: qty 17

## 2019-05-16 MED ORDER — MIDAZOLAM HCL 5 MG/5ML IJ SOLN
INTRAMUSCULAR | Status: AC
  Filled 2019-05-16: qty 10

## 2019-05-16 MED ORDER — PANTOPRAZOLE SODIUM 40 MG PO TBEC: 40.0000 mg | DELAYED_RELEASE_TABLET | Freq: Two times a day (BID) | ORAL | 1 refills | Status: DC

## 2019-05-16 NOTE — Progress Notes (Addendum)
PROGRESS NOTE    Karen Vazquez  [id number] DOB: [birth year] DOA: 05/15/2019 PCP: Kathyrn Drown, MD   Brief Narrative:  Per HPI: Karen Vazquez is a 64 y.o. female with medical history significant for iron deficiency anemia, hypertension, obstructive sleep apnea, hypothyroidism.  Presents to the ED with reports of low hemoglobin of 5.5.  She has a history of iron deficiency anemia for which she has had iron transfusions in the past, she has also had blood transfusions- 2011, but that was in the in the setting of knee surgery.  She reports NSAID use with ibuprofen 1-2 times a week, 400mg . She reports mild difficulty breathing with exertion, no chest pain.  She denies vomiting of blood, no bloody stools, no black stools.  She is supposed to be on iron tablets but she has not entirely been consistent with it.  She has a problem iron absorption from her diet.  2/25: Patient reports no bloody bowel movements.  She has had some dark stools prior to presentation.  She states that overall she is doing well and denies any abdominal pain, nausea, or vomiting.  She is looking forward to eating.  Assessment & Plan:   Active Problems:   Acute anemia   Acute on chronic anemia with suspected GI related blood loss in the setting of iron deficiency anemia -Patient continues to have iron deficiency for which Feraheme has been ordered -2 units PRBC have been transfused with improvement in hemoglobin to 7.1, will order third unit -Continue n.p.o. status with GI consultation appreciated for EGD -Maintain on IV Protonix daily -Continue gentle IV fluid until diet resumed  Restless leg syndrome -Likely related to iron deficiency -Continue ropinirole and pregabalin  Hypertension-stable -Continue home medications  Hypothyroidism -Resume home [place] Thyroid  DVT prophylaxis: SCDs Code Status: Full code Family Communication: None at bedside Disposition Plan: Per GI, plans for EGD   Consultants:    GI  Procedures:   As seen below  Antimicrobials:   None   Subjective: Patient seen and evaluated today with no new acute complaints or concerns. No acute concerns or events noted overnight.  No bloody bowel movements have been noted.  Objective: Vitals:   05/16/19 0544 05/16/19 1028 05/16/19 1100 05/16/19 1313  BP: (!) 108/46 (!) 130/52 (!) 119/57 (!) 132/51  Pulse: 77 73 84 70  Resp: 16 16 16 20   Temp: 98.8 F (37.1 C) 98.7 F (37.1 C) 98.6 F (37 C) (!) 97.5 F (36.4 C)  TempSrc: Oral Oral Oral Oral  SpO2: 97% 95% 96% 98%  Weight:      Height:        Intake/Output Summary (Last 24 hours) at 05/16/2019 1325 Last data filed at 05/15/2019 2005 Gross per 24 hour  Intake 50 ml  Output --  Net 50 ml   Filed Weights   05/15/19 1619  Weight: 110.7 kg    Examination:  General exam: Appears calm and comfortable  Respiratory system: Clear to auscultation. Respiratory effort normal. Cardiovascular system: S1 & S2 heard, RRR. No JVD, murmurs, rubs, gallops or clicks. No pedal edema. Gastrointestinal system: Abdomen is nondistended, soft and nontender. No organomegaly or masses felt. Normal bowel sounds heard. Central nervous system: Alert and oriented. No focal neurological deficits. Extremities: Symmetric 5 x 5 power. Skin: No rashes, lesions or ulcers Psychiatry: Judgement and insight appear normal. Mood & affect appropriate.     Data Reviewed: I have personally reviewed following labs and imaging studies  CBC: Recent Labs  Lab 05/14/19 1154 05/15/19 1736 05/16/19 0535  WBC 7.0 7.5 5.7  NEUTROABS 4.6 4.9  --   HGB 5.5* 5.5* 7.1*  HCT 21.6* 21.7* 26.2*  MCV 61* 61.8* 70.1*  PLT 277 287 [id number]   Basic Metabolic Panel: Recent Labs  Lab 05/14/19 1154 05/15/19 1736  NA 139 138  K 4.9 4.2  CL 99 103  CO2 22 25  GLUCOSE 67 110*  BUN 13 16  CREATININE 0.94 0.87  CALCIUM 9.1 9.2   GFR: Estimated Creatinine Clearance: 80.6 mL/min (by C-G formula based  on SCr of 0.87 mg/dL). Liver Function Tests: Recent Labs  Lab 05/14/19 1154 05/15/19 1736  AST 19 17  ALT 9 16  ALKPHOS 111 97  BILITOT 0.3 0.5  PROT 6.7 6.9  ALBUMIN 4.2 3.9   No results for input(s): LIPASE, AMYLASE in the last 168 hours. No results for input(s): AMMONIA in the last 168 hours. Coagulation Profile: No results for input(s): INR, PROTIME in the last 168 hours. Cardiac Enzymes: No results for input(s): CKTOTAL, CKMB, CKMBINDEX, TROPONINI in the last 168 hours. BNP (last 3 results) No results for input(s): PROBNP in the last 8760 hours. HbA1C: No results for input(s): HGBA1C in the last 72 hours. CBG: No results for input(s): GLUCAP in the last 168 hours. Lipid Profile: No results for input(s): CHOL, HDL, LDLCALC, TRIG, CHOLHDL, LDLDIRECT in the last 72 hours. Thyroid Function Tests: Recent Labs    05/14/19 1154  TSH 2.150  FREET4 1.26   Anemia Panel: Recent Labs    05/14/19 1154  VITAMINB12 267  FERRITIN 6*  TIBC 528*  IRON 13*   Sepsis Labs: No results for input(s): PROCALCITON, LATICACIDVEN in the last 168 hours.  Recent Results (from the past 240 hour(s))  SARS CORONAVIRUS 2 (TAT 6-24 HRS) Nasopharyngeal Nasopharyngeal Swab     Status: None   Collection Time: 05/15/19  6:32 PM   Specimen: Nasopharyngeal Swab  Result Value Ref Range Status   SARS Coronavirus 2 NEGATIVE NEGATIVE Final    Comment: (NOTE) SARS-CoV-2 target nucleic acids are NOT DETECTED. The SARS-CoV-2 RNA is generally detectable in upper and lower respiratory specimens during the acute phase of infection. Negative results do not preclude SARS-CoV-2 infection, do not rule out co-infections with other pathogens, and should not be used as the sole basis for treatment or other patient management decisions. Negative results must be combined with clinical observations, patient history, and epidemiological information. The expected result is Negative. Fact Sheet for  Patients: SugarRoll.be Fact Sheet for Healthcare Providers: https://www.woods-mathews.com/ This test is not yet approved or cleared by the [place] FDA and  has been authorized for detection and/or diagnosis of SARS-CoV-2 by FDA under an Emergency Use Authorization (EUA). This EUA will remain  in effect (meaning this test can be used) for the duration of the COVID-19 declaration under Section 56 4(b)(1) of the Act, 21 U.S.C. section 360bbb-3(b)(1), unless the authorization is terminated or revoked sooner. Performed at [facility], [place] [address] [place] [address], [place], [place] [address]          Radiology Studies: No results found.      Scheduled Meds: . sodium chloride   Intravenous Once  . fluticasone  2 spray Each Nare Daily  . pantoprazole (PROTONIX) IV  40 mg Intravenous Q24H  . rOPINIRole  4 mg Oral QHS  . zolpidem  5 mg Oral QHS   Continuous Infusions: . sodium chloride    . lactated ringers  LOS: 0 days    Time spent: 30 minutes    Karen Taha Darleen Crocker, DO [facility] Pager [phone]  If 7PM-7AM, please contact night-coverage [email or web address] Password [email or web address] 05/16/2019, 1:25 PM

## 2019-05-16 NOTE — Op Note (Addendum)
[facility] Patient Name: Karen Vazquez Procedure Date: 05/16/2019 1:57 PM MRN: [id number] Date of Birth: Mar 06, 1956 Attending MD: Hildred Laser , MD CSN: [id number] Age: 64 Admit Type: Outpatient Procedure:                Upper GI endoscopy Indications:              Unexplained iron deficiency anemia Providers:                Hildred Laser, MD, Otis Peak B. Sharon Seller, RN, Aram Candela Referring MD:             Elayne Snare. Wolfgang Phoenix, MD Medicines:                Lidocaine spray, Meperidine 50 mg IV, Midazolam 7                            mg IV Complications:            No immediate complications. Estimated Blood Loss:     Estimated blood loss was minimal. Procedure:                Pre-Anesthesia Assessment:                           - Prior to the procedure, a History and Physical                            was performed, and patient medications and                            allergies were reviewed. The patient's tolerance of                            previous anesthesia was also reviewed. The risks                            and benefits of the procedure and the sedation                            options and risks were discussed with the patient.                            All questions were answered, and informed consent                            was obtained. Prior Anticoagulants: The patient has                            taken no previous anticoagulant or antiplatelet                            agents except for NSAID medication. ASA Grade  Assessment: II - A patient with mild systemic                            disease. After reviewing the risks and benefits,                            the patient was deemed in satisfactory condition to                            undergo the procedure.                           After obtaining informed consent, the endoscope was                            passed under direct vision. Throughout  the                            procedure, the patient's blood pressure, pulse, and                            oxygen saturations were monitored continuously. The                            [id number]) scope was introduced through the                            mouth, and advanced to the second part of duodenum.                            The upper GI endoscopy was accomplished without                            difficulty. The patient tolerated the procedure                            well. Scope In: 2:23:49 PM Scope Out: 2:32:27 PM Total Procedure Duration: 0 hours 8 minutes 38 seconds  Findings:      The examined esophagus was normal.      The Z-line was regular and was found 30 cm from the incisors.      An 8 cm hiatal hernia was present.      A few erosions with no stigmata of recent bleeding were found in the       gastric fundus.      The exam of the stomach was otherwise normal.      One non-bleeding superficial duodenal ulcer with no stigmata of bleeding       was found in the duodenal bulb. The lesion was four mm by six mm in       largest dimension.      The second portion of the duodenum was normal. Impression:               - Normal esophagus.                           -  Z-line regular, 30 cm from the incisors.                           - 8 cm hiatal hernia.                           - Erosive gastropathy with no stigmata of recent                            bleeding.                           - Non-bleeding duodenal ulcer with no stigmata of                            bleeding.                           - Normal second portion of the duodenum.                           - No specimens collected. Moderate Sedation:      Moderate (conscious) sedation was administered by the endoscopy nurse       and supervised by the endoscopist. The following parameters were       monitored: oxygen saturation, heart rate, blood pressure, CO2       capnography and response to care.  Total physician intraservice time was       14 minutes. Recommendation:           - Patient has a contact number available for                            emergencies. The signs and symptoms of potential                            delayed complications were discussed with the                            patient. Return to normal activities tomorrow.                            Written discharge instructions were provided to the                            patient.                           - Cardiac diet today.                           - Continue present medications.                           - Pantoprazole 40 mg po bid.                           - Resume po iron as before.                           -  No aspirin, ibuprofen, naproxen, or other                            non-steroidal anti-inflammatory drugs.                           - Await pathology results. Procedure Code(s):        --- Professional ---                           [phone], Esophagogastroduodenoscopy, flexible,                            transoral; diagnostic, including collection of                            specimen(s) by brushing or washing, when performed                            (separate procedure)                           G0500, Moderate sedation services provided by the                            same physician or other qualified health care                            professional performing a gastrointestinal                            endoscopic service that sedation supports,                            requiring the presence of an independent trained                            observer to assist in the monitoring of the                            patient's level of consciousness and physiological                            status; initial 15 minutes of intra-service time;                            patient age 29 years or older (additional time may                            be reported with [phone], as  appropriate) Diagnosis Code(s):        --- Professional ---                           K44.9, Diaphragmatic hernia without obstruction or  gangrene                           K31.89, Other diseases of stomach and duodenum                           K26.9, Duodenal ulcer, unspecified as acute or                            chronic, without hemorrhage or perforation                           D50.9, Iron deficiency anemia, unspecified CPT copyright 2019 American [employer]. All rights reserved. The codes documented in this report are preliminary and upon coder review may  be revised to meet current compliance requirements. Hildred Laser, MD Hildred Laser, MD 05/16/2019 2:50:12 PM This report has been signed electronically. Number of Addenda: 1 Addendum Number: 1   Addendum Date: 05/21/2019 6:21:55 PM      Gastric biopsy taken from antral mucosa.      Duodenal biopsy taken from post bulbar duodenal mucosa. Hildred Laser, MD Hildred Laser, MD 05/21/2019 6:23:07 PM This report has been signed electronically.

## 2019-05-16 NOTE — Plan of Care (Signed)
  Problem: Nutrition: Goal: Adequate nutrition will be maintained Outcome: Progressing Note: Patient remains NPO for procedure    Problem: Pain Managment: Goal: General experience of comfort will improve Outcome: Progressing   Problem: Safety: Goal: Ability to remain free from injury will improve Outcome: Progressing

## 2019-05-16 NOTE — Consult Note (Signed)
Referring Provider:  Heath Lark, DO Primary Care Physician:  Kathyrn Drown, MD Primary Gastroenterologist:  Dr. Laural Golden  Reason for Consultation:   Iron deficiency anemia.  HPI:   Patient is 64 year old Caucasian female with lifelong history of iron deficiency anemia who had not been taking her iron pills daily.  Over the last few weeks she has noted progressive fatigue and exertional dyspnea.  She was seen by Dr. Nicki Reaper looking.  Her hemoglobin was low.  Patient was sent to emergency room and subsequently hospitalized.  She has received 2 units of PRBCs and is to get 30 unit today.  Patient states she was begun on pantoprazole recently when she was found to have hiatal hernia on her chest found. She denies nausea vomiting heartburn dysphagia abdominal pain melena or rectal bleeding.  She also denies vaginal bleeding or hematuria.  She has good appetite and has not lost any weight.  She takes ibuprofen no more than 400 mg once or twice a week. She has a craving for ice. Her last colonoscopy was in December 2017 because of history of polyps.  She had single tubular adenoma removed.  She has never undergone an EGD. Family history is negative for celiac disease.  She is retired.  She worked for [employer] for 27-1/2 years.  She does not smoke cigarettes.  She may have alcohol occasionally.  She is married and has 2 daughters in good health. Father died of COPD at age 41.  Mother lived to be 20.  She developed dementia in later years. She has 2 brothers who are younger and in good health other than hypertension.  Past Medical History:  Diagnosis Date  . Anemia   . Anxiety   . Arthritis   . Back pain   . Blood transfusion 2011   at [facility]  . Bulging lumbar disc   . Chest pain   . Depression   . Dry mouth   . Easy bruising   . Excessive thirst   . Fatigue   . Frequent urination   . GERD (gastroesophageal reflux disease)   . Headache   . Heat intolerance   . Hypertension   .  Hypothyroidism   . Joint pain   . Leg pain   . Muscle stiffness   . Nervousness   . Palpitations   . Pre-diabetes   . Restless leg syndrome   . Shortness of breath on exertion   . Sleep apnea    does not use c-pap machine  . Stomach ulcer   . Stress   . Swelling of both lower extremities   . Trouble in sleeping   . Vitamin D deficiency   . Weakness     Past Surgical History:  Procedure Laterality Date  . APPENDECTOMY    . BLADDER SUSPENSION  12/29/2010   Procedure: TRANSVAGINAL TAPE (TVT) PROCEDURE;  Surgeon: Daria Pastures;  Location: [facility];  Service: Gynecology;  Laterality: N/A;  . BREAST SURGERY     left breast lumpectomy 1977  . COLONOSCOPY N/A 03/03/2016   Procedure: COLONOSCOPY;  Surgeon: Rogene Houston, MD;  [facility];  Service: Endoscopy;  Laterality: N/A;  2:00 - moved to 12/14 @ 12:55 - Ann notified pt  . CYSTOCELE REPAIR  12/29/2010   Procedure: ANTERIOR REPAIR (CYSTOCELE);  Surgeon: Daria Pastures;  Location: [facility];  Service: Gynecology;  Laterality: N/A;  . CYSTOSCOPY  12/29/2010   Procedure: CYSTOSCOPY;  Surgeon: Daria Pastures;  Location: [facility];  Service: Gynecology;  Laterality: N/A;  . JOINT REPLACEMENT  2011   rt knee  . REPLACEMENT TOTAL KNEE Right 2011  . TONSILLECTOMY    . TOTAL KNEE ARTHROPLASTY Left 11/17/2014   Procedure: LEFT TOTAL KNEE ARTHROPLASTY;  Surgeon: Gaynelle Arabian, MD;  Location: [facility];  Service: Orthopedics;  Laterality: Left;  . TUBAL LIGATION    . VAGINAL HYSTERECTOMY  12/29/2010   Procedure: HYSTERECTOMY VAGINAL;  Surgeon: Daria Pastures;  Location: [facility];  Service: Gynecology;  Laterality: N/A;    Prior to Admission medications   Medication Sig Start Date End Date Taking? Authorizing Provider  albuterol (PROVENTIL HFA;VENTOLIN HFA) 108 (90 Base) MCG/ACT inhaler Inhale 2 puffs into the lungs every 6 (six) hours as needed for wheezing or shortness of breath. 03/02/17  Yes Mikey Kirschner, MD   ALPRAZolam Duanne Moron) 0.5 MG tablet TAKE ONE TABLET BY MOUTH TWO TIMES DAILYAS NEEDED FOR ANXIETY OR SLEEP. Patient taking differently: Take 0.5 mg by mouth 2 (two) times daily as needed for anxiety.  06/28/18  Yes Luking, Elayne Snare, MD  amLODipine (NORVASC) 2.5 MG tablet Take 1 tablet (2.5 mg total) by mouth daily. Patient taking differently: Take 2.5 mg by mouth every morning.  02/06/19  Yes Luking, Elayne Snare, MD  ARMOUR THYROID 15 MG tablet TAKE ONE (1) TABLET EACH DAY Patient taking differently: Take 15 mg by mouth daily before breakfast.  02/06/19  Yes Luking, Elayne Snare, MD  ergocalciferol (VITAMIN D2) 1.25 MG (50000 UT) capsule Take 1 capsule (50,000 Units total) by mouth once a week. Patient taking differently: Take 50,000 Units by mouth every Sunday.  02/06/19  Yes Kathyrn Drown, MD  Pediatric Multiple Vit-C-FA (FLINSTONES GUMMIES OMEGA-3 DHA PO) Take 1 tablet by mouth daily.   Yes [provider]  pregabalin (LYRICA) 75 MG capsule Take 75 mg by mouth at bedtime.  04/09/19  Yes [provider]  rOPINIRole (REQUIP) 4 MG tablet TAEK ONE TABLET (4MG  TOTAL) BY MOUTH EVERY EVENING Patient taking differently: Take 4 mg by mouth at bedtime.  01/29/19  Yes Kathyrn Drown, MD  thyroid (ARMOUR THYROID) 30 MG tablet TAKE ONE (1) TABLET BY MOUTH EVERY DAY Patient taking differently: Take 30 mg by mouth daily before breakfast.  02/06/19  Yes Luking, Elayne Snare, MD  zolpidem (AMBIEN) 5 MG tablet Take 1 tablet (5 mg total) by mouth at bedtime as needed for sleep. Patient taking differently: Take 5 mg by mouth at bedtime.  05/14/19  Yes Luking, Elayne Snare, MD  fluticasone (FLONASE) 50 MCG/ACT nasal spray Place 2 sprays into both nostrils daily. 05/14/19   Kathyrn Drown, MD  pantoprazole (PROTONIX) 40 MG tablet Take 1 tablet (40 mg total) by mouth daily. 05/14/19   Kathyrn Drown, MD    Current Facility-Administered Medications  Medication Dose Route Frequency Provider Last Rate Last Admin  . 0.9 %   sodium chloride infusion (Manually program via Guardrails IV Fluids)   Intravenous Once Manuella Ghazi, Pratik D, DO      . acetaminophen (TYLENOL) tablet 650 mg  650 mg Oral Q6H PRN Emokpae, Ejiroghene E, MD       Or  . acetaminophen (TYLENOL) suppository 650 mg  650 mg Rectal Q6H PRN Emokpae, Ejiroghene E, MD      . ALPRAZolam (XANAX) tablet 0.5 mg  0.5 mg Oral BID PRN Emokpae, Ejiroghene E, MD      . fluticasone (FLONASE) 50 MCG/ACT nasal spray 2 spray  2 spray  Each Nare Daily Emokpae, Ejiroghene E, MD   2 spray at 05/16/19 [phone]  . ondansetron (ZOFRAN) tablet 4 mg  4 mg Oral Q6H PRN Emokpae, Ejiroghene E, MD       Or  . ondansetron (ZOFRAN) injection 4 mg  4 mg Intravenous Q6H PRN Emokpae, Ejiroghene E, MD      . pantoprazole (PROTONIX) injection 40 mg  40 mg Intravenous Q24H Emokpae, Ejiroghene E, MD   40 mg at 05/15/19 2239  . polyethylene glycol (MIRALAX / GLYCOLAX) packet 17 g  17 g Oral Daily PRN Emokpae, Ejiroghene E, MD      . rOPINIRole (REQUIP) tablet 4 mg  4 mg Oral QHS Emokpae, Ejiroghene E, MD   4 mg at 05/15/19 2244  . zolpidem (AMBIEN) tablet 5 mg  5 mg Oral QHS Emokpae, Ejiroghene E, MD   5 mg at 05/15/19 2244    Allergies as of 05/15/2019 - Review Complete 05/15/2019  Allergen Reaction Noted  . Lisinopril Cough 06/11/2014    Family History  Problem Relation Age of Onset  . Stroke Mother   . Hypertension Mother   . Hyperlipidemia Mother   . Thyroid disease Mother   . Heart disease Father   . COPD Father   . Cancer Father   . Depression Father   . Cancer Paternal Grandmother        breast  . Diabetes Paternal Grandfather     Social History   Socioeconomic History  . Marital status: Married    Spouse name: Jori Moll  . Number of children: Not on file  . Years of education: Not on file  . Highest education level: Not on file  Occupational History  . Occupation: Retired  Tobacco Use  . Smoking status: Never Smoker  . Smokeless tobacco: Never Used  Substance and  Sexual Activity  . Alcohol use: No  . Drug use: No  . Sexual activity: Yes    Birth control/protection: Post-menopausal  Other Topics Concern  . Not on file  Social History Narrative  . Not on file   Social Determinants of Health   Financial Resource Strain:   . Difficulty of Paying Living Expenses: Not on file  Food Insecurity:   . Worried About Charity fundraiser in the Last Year: Not on file  . Ran Out of Food in the Last Year: Not on file  Transportation Needs:   . Lack of Transportation (Medical): Not on file  . Lack of Transportation (Non-Medical): Not on file  Physical Activity:   . Days of Exercise per Week: Not on file  . Minutes of Exercise per Session: Not on file  Stress:   . Feeling of Stress : Not on file  Social Connections:   . Frequency of Communication with Friends and Family: Not on file  . Frequency of Social Gatherings with Friends and Family: Not on file  . Attends Religious Services: Not on file  . Active Member of Clubs or Organizations: Not on file  . Attends Archivist Meetings: Not on file  . Marital Status: Not on file  Intimate Partner Violence:   . Fear of Current or Ex-Partner: Not on file  . Emotionally Abused: Not on file  . Physically Abused: Not on file  . Sexually Abused: Not on file    Review of Systems: See HPI, otherwise normal ROS  Physical Exam: Temp:  [98.1 F (36.7 C)-99.1 F (37.3 C)] 98.8 F (37.1 C) (02/25 0544) Pulse Rate:  [  77-101] 77 (02/25 0544) Resp:  [16-19] 16 (02/25 0544) BP: (108-141)/(46-70) 108/46 (02/25 0544) SpO2:  [95 %-98 %] 97 % (02/25 0544) Weight:  [110.7 kg] 110.7 kg (02/24 1619) Last BM Date: 05/15/19  Patient is alert and in no acute distress. Conjunctiva is pale.  Sclerae nonicteric. Oropharyngeal mucosa is normal. Neck without masses thyromegaly or lymphadenopathy. Cardiac exam with regular rhythm normal S1 and S2.  She has faint systolic murmur best heard at aortic  area. Auscultation lungs reveal vesicular breath sounds bilaterally. Abdomen is full.  Bowel sounds are normal.  No bruit noted.  On palpation abdomen is soft and nontender with no organomegaly or masses. No peripheral edema clubbing or koilonychia noted. She does not have a skin rash.    Lab Results: Recent Labs    05/14/19 1154 05/15/19 1736 05/16/19 0535  WBC 7.0 7.5 5.7  HGB 5.5* 5.5* 7.1*  HCT 21.6* 21.7* 26.2*  PLT 277 287 222   BMET Recent Labs    05/14/19 1154 05/15/19 1736  NA 139 138  K 4.9 4.2  CL 99 103  CO2 22 25  GLUCOSE 67 110*  BUN 13 16  CREATININE 0.94 0.87  CALCIUM 9.1 9.2   LFT Recent Labs    05/14/19 1154 05/14/19 1154 05/15/19 1736  PROT 6.7  --  6.9  ALBUMIN 4.2  --  3.9  AST 19   < > 17  ALT 9   < > 16  ALKPHOS 111   < > 97  BILITOT 0.3  --  0.5  BILIDIR 0.09  --   --    < > = values in this interval not displayed.   PT/INR No results for input(s): LABPROT, INR in the last 72 hours. Hepatitis Panel No results for input(s): HEPBSAG, HCVAB, HEPAIGM, HEPBIGM in the last 72 hours.  Studies/Results: DG Chest 2 View  Result Date: 05/14/2019 CLINICAL DATA:  Hacking cough. Shortness of breath. Negative COVID-19 test. EXAM: CHEST - 2 VIEW COMPARISON:  None. FINDINGS: There is a moderate hiatal hernia. The heart, hila, and mediastinum are normal. No pneumothorax. No nodules or mass. No focal infiltrates. IMPRESSION: Moderate hiatal hernia.  No acute abnormalities. Electronically Signed   By: Dorise Bullion III M.D   On: 05/14/2019 18:03    Assessment;  Patient is 64 year old Caucasian female with lifelong history of iron deficiency anemia who was last colonoscopy in December 2017 with removal of small tubular adenoma who presents with progressive fatigue and exertional dyspnea and found to be profoundly anemic with hemoglobin of 5.5 g.  Patient has received 2 units PRBCs and to receive third unit. Suspect impaired iron absorption but need  to rule out celiac disease although she does not have any other symptoms that one would encountering this diagnosis. If she is documented to have heme positive stool may consider small bowel given capsule study and colonoscopy ahead of schedule.  She is supposed to have a colonoscopy in December 2022.  Recommendations;  Diagnostic esophagogastroduodenoscopy with duodenal biopsy if no other abnormality encountered. Further recommendations to follow.   LOS: 0 days   Cariann Kinnamon  05/16/2019, 10:25 AM

## 2019-05-16 NOTE — Progress Notes (Signed)
Discussed AVS including medications & need for follow up appointments with the patient and all questioned fully answered. Patient taken with personal belongings to discharge area.

## 2019-05-16 NOTE — Discharge Summary (Addendum)
Physician Discharge Summary  SOSHA EMSLEY [id number] DOB: Oct 19, 1955 DOA: 05/15/2019  PCP: Kathyrn Drown, MD  Admit date: 05/15/2019  Discharge date: 05/16/2019  Admitted From:Home  Disposition:  Home  Recommendations for Outpatient Follow-up:  1. Follow up with PCP in 1-2 weeks and follow-up CBC. 2. Follow-up with Dr. Laural Golden in 6 weeks 3. Continue PPI as prescribed twice daily 4. Avoid NSAIDs 5. Continue iron supplementation orally at home as previously prescribed  Home Health: None  Equipment/Devices: None  Discharge Condition: Stable  CODE STATUS: Full  Diet recommendation: Heart Healthy  Brief/Interim Summary: Per HPI: Karen Phillipe Perkinsis a 64 y.o.femalewith medical history significant foriron deficiency anemia, hypertension, obstructive sleep apnea, hypothyroidism. Presents to the ED with reports of low hemoglobin of 5.5. She has a history of iron deficiency anemia for which she has had iron transfusions in the past,she has also had blood transfusions- 2011, but that was in thein the setting of knee surgery. She reports NSAID use with ibuprofen 1-2 times a week, 400mg . She reports mild difficulty breathing with exertion, no chest pain. She denies vomiting of blood, no bloody stools, no black stools. She is supposed to be on iron tablets but she has not entirely been consistent with it.She has a problem iron absorption from her diet.  2/25: Patient reports no bloody bowel movements.  She has had some dark stools prior to presentation.  She states that overall she is doing well and denies any abdominal pain, nausea, or vomiting.  She had undergone EGD earlier this afternoon with sliding hiatal hernia and mucosal erosions, but with no active bleeding noted.  She will finish up her third unit of transfusion and will be stable for discharge.  It appears that much of her anemia is secondary to her severe iron deficiency.  She has received IV Feraheme while here as  well to help with her iron status.  She will need repeat CBC in the outpatient setting in 1 week to ensure stability.  No other acute events otherwise noted and she is stable for discharge to home. Repeat hemoglobin is 8.6.  Discharge Diagnoses:  Active Problems:   Acute anemia  Principal discharge diagnosis: Acute on chronic anemia secondary to severe iron deficiency anemia.  Discharge Instructions  Discharge Instructions    Diet - low sodium heart healthy   Complete by: As directed    Increase activity slowly   Complete by: As directed      Allergies as of 05/16/2019      Reactions   Lisinopril Cough      Medication List    TAKE these medications   albuterol 108 (90 Base) MCG/ACT inhaler Commonly known as: VENTOLIN HFA Inhale 2 puffs into the lungs every 6 (six) hours as needed for wheezing or shortness of breath.   ALPRAZolam 0.5 MG tablet Commonly known as: XANAX TAKE ONE TABLET BY MOUTH TWO TIMES DAILYAS NEEDED FOR ANXIETY OR SLEEP. What changed:   how much to take  how to take this  when to take this  reasons to take this  additional instructions   amLODipine 2.5 MG tablet Commonly known as: NORVASC Take 1 tablet (2.5 mg total) by mouth daily. What changed: when to take this   ergocalciferol 1.25 MG (50000 UT) capsule Commonly known as: VITAMIN D2 Take 1 capsule (50,000 Units total) by mouth once a week. What changed: when to take this   FLINSTONES GUMMIES OMEGA-3 DHA PO Take 1 tablet by mouth daily.  fluticasone 50 MCG/ACT nasal spray Commonly known as: Flonase Place 2 sprays into both nostrils daily.   pantoprazole 40 MG tablet Commonly known as: PROTONIX Take 1 tablet (40 mg total) by mouth 2 (two) times daily before a meal. What changed: when to take this   pregabalin 75 MG capsule Commonly known as: LYRICA Take 75 mg by mouth at bedtime.   rOPINIRole 4 MG tablet Commonly known as: REQUIP TAEK ONE TABLET (4MG  TOTAL) BY MOUTH EVERY  EVENING What changed: See the new instructions.   thyroid 30 MG tablet Commonly known as: Armour Thyroid TAKE ONE (1) TABLET BY MOUTH EVERY DAY What changed:   how much to take  how to take this  when to take this  additional instructions   Armour Thyroid 15 MG tablet Generic drug: thyroid TAKE ONE (1) TABLET EACH DAY What changed:   how much to take  how to take this  when to take this  additional instructions   zolpidem 5 MG tablet Commonly known as: AMBIEN Take 1 tablet (5 mg total) by mouth at bedtime as needed for sleep. What changed: when to take this      Follow-up Information    Luking, Elayne Snare, MD Follow up in 1 week(s).   Specialty: Family Medicine Contact information: [place] [address] [phone]        Rogene Houston, MD Follow up in 6 week(s).   Specialty: Gastroenterology Contact information: [address], [address] [place] [address] [phone]          Allergies  Allergen Reactions  . Lisinopril Cough    Consultations:  GI   Procedures/Studies: DG Chest 2 View  Result Date: 05/14/2019 CLINICAL DATA:  Hacking cough. Shortness of breath. Negative COVID-19 test. EXAM: CHEST - 2 VIEW COMPARISON:  None. FINDINGS: There is a moderate hiatal hernia. The heart, hila, and mediastinum are normal. No pneumothorax. No nodules or mass. No focal infiltrates. IMPRESSION: Moderate hiatal hernia.  No acute abnormalities. Electronically Signed   By: Dorise Bullion III M.D   On: 05/14/2019 18:03     Discharge Exam: Vitals:   05/16/19 1430 05/16/19 1435  BP: (!) 149/71   Pulse: 86 86  Resp: 14 17  Temp:    SpO2: 97% 98%   Vitals:   05/16/19 1420 05/16/19 1425 05/16/19 1430 05/16/19 1435  BP: 139/80 (!) 158/82 (!) 149/71   Pulse: 86 91 86 86  Resp: 20 19 14 17   Temp:      TempSrc:      SpO2: 98% 96% 97% 98%  Weight:      Height:        General: Pt is alert, awake, not in acute  distress Cardiovascular: RRR, S1/S2 +, no rubs, no gallops Respiratory: CTA bilaterally, no wheezing, no rhonchi Abdominal: Soft, NT, ND, bowel sounds + Extremities: no edema, no cyanosis    The results of significant diagnostics from this hospitalization (including imaging, microbiology, ancillary and laboratory) are listed below for reference.     Microbiology: Recent Results (from the past 240 hour(s))  SARS CORONAVIRUS 2 (TAT 6-24 HRS) Nasopharyngeal Nasopharyngeal Swab     Status: None   Collection Time: 05/15/19  6:32 PM   Specimen: Nasopharyngeal Swab  Result Value Ref Range Status   SARS Coronavirus 2 NEGATIVE NEGATIVE Final    Comment: (NOTE) SARS-CoV-2 target nucleic acids are NOT DETECTED. The SARS-CoV-2 RNA is generally detectable in upper and lower respiratory  specimens during the acute phase of infection. Negative results do not preclude SARS-CoV-2 infection, do not rule out co-infections with other pathogens, and should not be used as the sole basis for treatment or other patient management decisions. Negative results must be combined with clinical observations, patient history, and epidemiological information. The expected result is Negative. Fact Sheet for Patients: SugarRoll.be Fact Sheet for Healthcare Providers: https://www.woods-mathews.com/ This test is not yet approved or cleared by the [place] FDA and  has been authorized for detection and/or diagnosis of SARS-CoV-2 by FDA under an Emergency Use Authorization (EUA). This EUA will remain  in effect (meaning this test can be used) for the duration of the COVID-19 declaration under Section 56 4(b)(1) of the Act, 21 U.S.C. section 360bbb-3(b)(1), unless the authorization is terminated or revoked sooner. Performed at [facility], [place] [address] [place], [place] [address]      Labs: BNP (last 3 results) Recent Labs    05/14/19 1154  BNP 123XX123    Basic Metabolic Panel: Recent Labs  Lab 05/14/19 1154 05/15/19 1736  NA 139 138  K 4.9 4.2  CL 99 103  CO2 22 25  GLUCOSE 67 110*  BUN 13 16  CREATININE 0.94 0.87  CALCIUM 9.1 9.2   Liver Function Tests: Recent Labs  Lab 05/14/19 1154 05/15/19 1736  AST 19 17  ALT 9 16  ALKPHOS 111 97  BILITOT 0.3 0.5  PROT 6.7 6.9  ALBUMIN 4.2 3.9   No results for input(s): LIPASE, AMYLASE in the last 168 hours. No results for input(s): AMMONIA in the last 168 hours. CBC: Recent Labs  Lab 05/14/19 1154 05/15/19 1736 05/16/19 0535  WBC 7.0 7.5 5.7  NEUTROABS 4.6 4.9  --   HGB 5.5* 5.5* 7.1*  HCT 21.6* 21.7* 26.2*  MCV 61* 61.8* 70.1*  PLT 277 287 222   Cardiac Enzymes: No results for input(s): CKTOTAL, CKMB, CKMBINDEX, TROPONINI in the last 168 hours. BNP: Invalid input(s): POCBNP CBG: No results for input(s): GLUCAP in the last 168 hours. D-Dimer No results for input(s): DDIMER in the last 72 hours. Hgb A1c No results for input(s): HGBA1C in the last 72 hours. Lipid Profile No results for input(s): CHOL, HDL, LDLCALC, TRIG, CHOLHDL, LDLDIRECT in the last 72 hours. Thyroid function studies Recent Labs    05/14/19 1154  TSH 2.150   Anemia work up Recent Labs    05/14/19 1154  VITAMINB12 267  FERRITIN 6*  TIBC 528*  IRON 13*   Urinalysis    Component Value Date/Time   COLORURINE YELLOW 11/10/2014 [place] 11/10/2014 0850   LABSPEC 1.012 11/10/2014 0850   PHURINE 5.5 11/10/2014 0850   GLUCOSEU NEGATIVE 11/10/2014 0850   HGBUR NEGATIVE 11/10/2014 0850   BILIRUBINUR NEGATIVE 11/10/2014 0850   KETONESUR NEGATIVE 11/10/2014 0850   PROTEINUR NEGATIVE 11/10/2014 0850   UROBILINOGEN 0.2 11/10/2014 0850   NITRITE NEGATIVE 11/10/2014 0850   LEUKOCYTESUR NEGATIVE 11/10/2014 0850   Sepsis Labs Invalid input(s): PROCALCITONIN,  WBC,  LACTICIDVEN Microbiology Recent Results (from the past 240 hour(s))  SARS CORONAVIRUS 2 (TAT 6-24 HRS)  Nasopharyngeal Nasopharyngeal Swab     Status: None   Collection Time: 05/15/19  6:32 PM   Specimen: Nasopharyngeal Swab  Result Value Ref Range Status   SARS Coronavirus 2 NEGATIVE NEGATIVE Final    Comment: (NOTE) SARS-CoV-2 target nucleic acids are NOT DETECTED. The SARS-CoV-2 RNA is generally detectable in upper and lower respiratory specimens during the acute  phase of infection. Negative results do not preclude SARS-CoV-2 infection, do not rule out co-infections with other pathogens, and should not be used as the sole basis for treatment or other patient management decisions. Negative results must be combined with clinical observations, patient history, and epidemiological information. The expected result is Negative. Fact Sheet for Patients: SugarRoll.be Fact Sheet for Healthcare Providers: https://www.woods-mathews.com/ This test is not yet approved or cleared by the [place] FDA and  has been authorized for detection and/or diagnosis of SARS-CoV-2 by FDA under an Emergency Use Authorization (EUA). This EUA will remain  in effect (meaning this test can be used) for the duration of the COVID-19 declaration under Section 56 4(b)(1) of the Act, 21 U.S.C. section 360bbb-3(b)(1), unless the authorization is terminated or revoked sooner. Performed at [facility], [place] [address], [place], [place] [address]      Time coordinating discharge: 35 minutes  SIGNED:   Rodena Goldmann, DO [facility] 05/16/2019, 4:01 PM   If 7PM-7AM, please contact night-coverage [email or web address]

## 2019-05-16 NOTE — Progress Notes (Signed)
Brief EGD note.  Normal mucosa of esophagus. GE junction unremarkable. Large/8 cm sliding hiatal hernia with mucosal erosions. Mucosal erosions also noted at the level of hiatus. Normal mucosa gastric body antrum and pylorus.  Biopsy taken. Small bulbar ulcer. Normal mucosa of post bulbar mucosa.  Biopsy taken to check for celiac disease

## 2019-05-17 LAB — TYPE AND SCREEN
ABO/RH(D): O NEG
Antibody Screen: NEGATIVE
Unit division: 0
Unit division: 0
Unit division: 0

## 2019-05-17 LAB — BPAM RBC
Blood Product Expiration Date: 202102282359
Blood Product Expiration Date: 202103292359
Blood Product Expiration Date: 202103292359
ISSUE DATE / TIME: 202102241956
ISSUE DATE / TIME: 202102242315
ISSUE DATE / TIME: 202102251040
Unit Type and Rh: 9500
Unit Type and Rh: 9500
Unit Type and Rh: 9500

## 2019-05-18 ENCOUNTER — Telehealth: Payer: Self-pay | Admitting: Family Medicine

## 2019-05-18 DIAGNOSIS — I10 Essential (primary) hypertension: Secondary | ICD-10-CM

## 2019-05-18 DIAGNOSIS — D509 Iron deficiency anemia, unspecified: Secondary | ICD-10-CM

## 2019-05-18 NOTE — Telephone Encounter (Signed)
Patient was recently discharged from the hospital on the 25th it will be necessary for the patient to do a CBC and metabolic 7 on March 8 or 9 I believe it would be a good idea for her to do a CBC metabolic 7 at the start of the week and do a follow-up visit later that week This would take the place of her follow-up at the end of March thank you

## 2019-05-20 ENCOUNTER — Other Ambulatory Visit: Payer: Self-pay

## 2019-05-20 ENCOUNTER — Telehealth: Payer: Self-pay | Admitting: Family Medicine

## 2019-05-20 LAB — SURGICAL PATHOLOGY

## 2019-05-20 NOTE — Telephone Encounter (Signed)
Blood work ordered in [employer]. 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 Patient states urgent care said they don't do that anymore.

## 2019-05-20 NOTE — Addendum Note (Signed)
Addended by: Dairl Ponder on: 05/20/2019 10:34 AM   Modules accepted: Orders

## 2019-05-20 NOTE — Telephone Encounter (Signed)
Patient called in to make a hospital f/u appt.  I read the note in the chart from doctor Nicki Reaper and explained to her that he wanted her to have labs done and schedule for end of next week.  Patient made a virtual appt for 05/31/18 and I canceled her end of March visit.  Please call patient when lab orders are put in.  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

## 2019-05-20 NOTE — Telephone Encounter (Signed)
Blood work ordered in Epic. Patient notified and verbalized understanding. ?

## 2019-05-22 NOTE — Telephone Encounter (Signed)
Patient notified

## 2019-05-22 NOTE — Telephone Encounter (Signed)
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  Also down in [place] there are occupational medicine facilities that do these

## 2019-05-22 NOTE — Telephone Encounter (Signed)
Left message to return call 

## 2019-05-27 DIAGNOSIS — I10 Essential (primary) hypertension: Secondary | ICD-10-CM | POA: Diagnosis not present

## 2019-05-27 DIAGNOSIS — D509 Iron deficiency anemia, unspecified: Secondary | ICD-10-CM | POA: Diagnosis not present

## 2019-05-27 DIAGNOSIS — G4733 Obstructive sleep apnea (adult) (pediatric): Secondary | ICD-10-CM | POA: Diagnosis not present

## 2019-05-28 LAB — CBC WITH DIFFERENTIAL/PLATELET
Basophils Absolute: 0 10*3/uL (ref 0.0–0.2)
Basos: 0 %
EOS (ABSOLUTE): 0.1 10*3/uL (ref 0.0–0.4)
Eos: 3 %
Hematocrit: 35.5 % (ref 34.0–46.6)
Hemoglobin: 10.7 g/dL — ABNORMAL LOW (ref 11.1–15.9)
Immature Grans (Abs): 0 10*3/uL (ref 0.0–0.1)
Immature Granulocytes: 0 %
Lymphocytes Absolute: 1.3 10*3/uL (ref 0.7–3.1)
Lymphs: 27 %
MCH: 22.4 pg — ABNORMAL LOW (ref 26.6–33.0)
MCHC: 30.1 g/dL — ABNORMAL LOW (ref 31.5–35.7)
MCV: 74 fL — ABNORMAL LOW (ref 79–97)
Monocytes Absolute: 0.3 10*3/uL (ref 0.1–0.9)
Monocytes: 6 %
Neutrophils Absolute: 3 10*3/uL (ref 1.4–7.0)
Neutrophils: 64 %
Platelets: 194 10*3/uL (ref 150–450)
RBC: 4.78 x10E6/uL (ref 3.77–5.28)
WBC: 4.8 10*3/uL (ref 3.4–10.8)

## 2019-05-28 LAB — BASIC METABOLIC PANEL
BUN/Creatinine Ratio: 14 (ref 12–28)
BUN: 13 mg/dL (ref 8–27)
CO2: 23 mmol/L (ref 20–29)
Calcium: 9.5 mg/dL (ref 8.7–10.3)
Chloride: 103 mmol/L (ref 96–106)
Creatinine, Ser: 0.94 mg/dL (ref 0.57–1.00)
GFR calc Af Amer: 75 mL/min/{1.73_m2} (ref >59)
GFR calc non Af Amer: 65 mL/min/{1.73_m2} (ref >59)
Glucose: 113 mg/dL — ABNORMAL HIGH (ref 65–99)
Potassium: 5 mmol/L (ref 3.5–5.2)
Sodium: 141 mmol/L (ref 134–144)

## 2019-05-31 ENCOUNTER — Ambulatory Visit (INDEPENDENT_AMBULATORY_CARE_PROVIDER_SITE_OTHER): Payer: Federal, State, Local not specified - PPO | Admitting: Family Medicine

## 2019-05-31 ENCOUNTER — Other Ambulatory Visit: Payer: Self-pay

## 2019-05-31 DIAGNOSIS — D509 Iron deficiency anemia, unspecified: Secondary | ICD-10-CM | POA: Diagnosis not present

## 2019-05-31 NOTE — Progress Notes (Signed)
   Subjective:    Patient ID: Karen Vazquez, female    DOB: [birth year], 64 y.o.   MRN: [id number]  HPI Patient recently hospitalization for anemia had to have transfusion at EGD was instructed to follow-up denies any vomitus rectal bleeding or black stools Patient calls for a follow up on a recent hospitalization for anemia. Patient states she is feeling much better and recent labs were better.  Virtual Visit via Video Note  I connected with Karen Vazquez on [id number] at 10:00 AM EST by a video enabled telemedicine application and verified that I am speaking with the correct person using two identifiers.  Location: Patient: home Provider: office   I discussed the limitations of evaluation and management by telemedicine and the availability of in person appointments. The patient expressed understanding and agreed to proceed.  History of Present Illness:    Observations/Objective:   Assessment and Plan:   Follow Up Instructions:    I discussed the assessment and treatment plan with the patient. The patient was provided an opportunity to ask questions and all were answered. The patient agreed with the plan and demonstrated an understanding of the instructions.   The patient was advised to call back or seek an in-person evaluation if the symptoms worsen or if the condition fails to improve as anticipated.  I provided 20 minutes of non-face-to-face time during this encounter.     Review of Systems     Objective:   Physical Exam  Patient had virtual visit Appears to be in no distress Atraumatic Neuro able to relate and oriented No apparent resp distress Color normal       Assessment & Plan:  1. Iron deficiency anemia, unspecified iron deficiency anemia type Significant iron deficient anemia recent CBC looks much better repeat this again along with TIBC and ferritin in a few weeks follow-up with gastroenterology as planned - CBC with Differential/Platelet -  Ferritin - Iron Binding Cap (TIBC)([facility]) We will check this lab work and also send a copy of it to her gastroenterologist patient will do a standard follow-up in the summer

## 2019-06-12 ENCOUNTER — Ambulatory Visit: Payer: Federal, State, Local not specified - PPO | Admitting: Family Medicine

## 2019-06-18 DIAGNOSIS — D509 Iron deficiency anemia, unspecified: Secondary | ICD-10-CM | POA: Diagnosis not present

## 2019-06-19 LAB — CBC WITH DIFFERENTIAL/PLATELET
Basophils Absolute: 0 10*3/uL (ref 0.0–0.2)
Basos: 0 %
EOS (ABSOLUTE): 0.2 10*3/uL (ref 0.0–0.4)
Eos: 3 %
Hematocrit: 39.1 % (ref 34.0–46.6)
Hemoglobin: 12.1 g/dL (ref 11.1–15.9)
Immature Grans (Abs): 0 10*3/uL (ref 0.0–0.1)
Immature Granulocytes: 0 %
Lymphocytes Absolute: 1.4 10*3/uL (ref 0.7–3.1)
Lymphs: 30 %
MCH: 24.6 pg — ABNORMAL LOW (ref 26.6–33.0)
MCHC: 30.9 g/dL — ABNORMAL LOW (ref 31.5–35.7)
MCV: 80 fL (ref 79–97)
Monocytes Absolute: 0.4 10*3/uL (ref 0.1–0.9)
Monocytes: 9 %
Neutrophils Absolute: 2.7 10*3/uL (ref 1.4–7.0)
Neutrophils: 58 %
Platelets: 151 10*3/uL (ref 150–450)
RBC: 4.91 x10E6/uL (ref 3.77–5.28)
WBC: 4.7 10*3/uL (ref 3.4–10.8)

## 2019-06-19 LAB — IRON AND TIBC
Iron Saturation: 25 % (ref 15–55)
Iron: 97 ug/dL (ref 27–139)
Total Iron Binding Capacity: 382 ug/dL (ref 250–450)
UIBC: 285 ug/dL (ref 118–369)

## 2019-06-19 LAB — FERRITIN: Ferritin: 26 ng/mL (ref 15–150)

## 2019-06-24 ENCOUNTER — Ambulatory Visit (INDEPENDENT_AMBULATORY_CARE_PROVIDER_SITE_OTHER): Payer: Federal, State, Local not specified - PPO | Admitting: Internal Medicine

## 2019-06-24 ENCOUNTER — Encounter (INDEPENDENT_AMBULATORY_CARE_PROVIDER_SITE_OTHER): Payer: Self-pay | Admitting: Internal Medicine

## 2019-06-24 ENCOUNTER — Other Ambulatory Visit: Payer: Self-pay

## 2019-06-24 VITALS — BP 144/82 | HR 88 | Temp 97.4°F | Ht 64.0 in | Wt 247.4 lb

## 2019-06-24 DIAGNOSIS — D509 Iron deficiency anemia, unspecified: Secondary | ICD-10-CM

## 2019-06-24 DIAGNOSIS — K219 Gastro-esophageal reflux disease without esophagitis: Secondary | ICD-10-CM

## 2019-06-24 DIAGNOSIS — K269 Duodenal ulcer, unspecified as acute or chronic, without hemorrhage or perforation: Secondary | ICD-10-CM | POA: Diagnosis not present

## 2019-06-24 MED ORDER — FLINSTONES GUMMIES OMEGA-3 DHA PO CHEW: 18.0000 mg | CHEWABLE_TABLET | Freq: Every day | ORAL | Status: DC

## 2019-06-24 NOTE — Patient Instructions (Addendum)
Next blood work repeated in 1 month. Decrease pantoprazole to once daily after 4 weeks.

## 2019-06-24 NOTE — Progress Notes (Signed)
Presenting complaint;  Follow-up for iron deficiency anemia GERD and duodenal ulcer.  Database and subjective:  Patient is 64 year old Caucasian female who has several year history of iron deficiency anemia who had not been taking her iron as recommended.  She was recently found to have hemoglobin of 5.5 g when she presented to Dr. Nicki Reaper looking for progressive fatigue and exertional dyspnea.  She received 2 units of PRBCs.  He had recently been started on pantoprazole but she was found to have large hernia on one of the imaging studies.  She has been taking ibuprofen once or twice a week.  She underwent esophagogastroduodenoscopy which revealed large sliding hiatal hernia with erosions involving the herniated part of the mucosa antral erosions and small bulbar ulcer without stigmata of bleed.  Gastric biopsy was negative for H. pylori infection.  Patient's PPI was doubled.  She was begun on Flintstone with iron chewable. She now returns for follow-up visit.  She had a blood work  on 06/18/2019 as planned. She feels much better.  She denies shortness of breath chest pain heartburn dysphagia nausea or vomiting.  Her bowels move regularly.  She eats her supper at least 4 hours before she goes to bed.  She also denies melena or rectal bleeding.  Her craving for ice has decreased.  She is also using pillows at night so that she is not sleeping flat.  She is planning to talk with health expert at [facility] and [facility] and hoping to get some help so she can lose weight.  She says she has gained 25 pounds in the last 5 years.  Current Medications: Outpatient Encounter Medications as of 06/24/2019  Medication Sig  . albuterol (PROVENTIL HFA;VENTOLIN HFA) 108 (90 Base) MCG/ACT inhaler Inhale 2 puffs into the lungs every 6 (six) hours as needed for wheezing or shortness of breath.  . ALPRAZolam (XANAX) 0.5 MG tablet TAKE ONE TABLET BY MOUTH TWO TIMES DAILYAS NEEDED FOR ANXIETY OR SLEEP. (Patient taking  differently: Take 0.5 mg by mouth 2 (two) times daily as needed for anxiety. )  . amLODipine (NORVASC) 2.5 MG tablet Take 1 tablet (2.5 mg total) by mouth daily. (Patient taking differently: Take 2.5 mg by mouth every morning. )  . ARMOUR THYROID 15 MG tablet TAKE ONE (1) TABLET EACH DAY (Patient taking differently: Take 15 mg by mouth daily before breakfast. )  . ergocalciferol (VITAMIN D2) 1.25 MG (50000 UT) capsule Take 1 capsule (50,000 Units total) by mouth once a week. (Patient taking differently: Take 50,000 Units by mouth every Sunday. )  . fluticasone (FLONASE) 50 MCG/ACT nasal spray Place 2 sprays into both nostrils daily.  . pantoprazole (PROTONIX) 40 MG tablet Take 1 tablet (40 mg total) by mouth 2 (two) times daily before a meal.  . Pediatric Multiple Vit-C-FA (FLINSTONES GUMMIES OMEGA-3 DHA PO) Take 1 tablet by mouth daily.  Marland Kitchen rOPINIRole (REQUIP) 4 MG tablet TAEK ONE TABLET (4MG TOTAL) BY MOUTH EVERY EVENING (Patient taking differently: Take 4 mg by mouth at bedtime. )  . thyroid (ARMOUR THYROID) 30 MG tablet TAKE ONE (1) TABLET BY MOUTH EVERY DAY (Patient taking differently: Take 30 mg by mouth daily before breakfast. )  . zolpidem (AMBIEN) 5 MG tablet Take 1 tablet (5 mg total) by mouth at bedtime as needed for sleep. (Patient taking differently: Take 5 mg by mouth at bedtime. )  . [DISCONTINUED] pregabalin (LYRICA) 75 MG capsule Take 75 mg by mouth at bedtime.    No  facility-administered encounter medications on file as of 06/24/2019.     Objective: Blood pressure (!) 144/82, pulse 88, temperature (!) 97.4 F (36.3 C), temperature source Temporal, height _0  (1.626 m), weight 247 lb 6.4 oz (112.2 kg), last menstrual period 05/24/2010. Patient is alert and in no acute distress. She is wearing a facial mask. Conjunctiva is pink. Sclera is nonicteric Oropharyngeal mucosa is normal. No neck masses or thyromegaly noted. Cardiac exam with regular rhythm normal S1 and S2. No murmur  or gallop noted. Lungs are clear to auscultation. Abdomen is full but soft and nontender with no organomegaly or masses. No LE edema or clubbing noted.  Labs/studies Results:  CBC Latest Ref Rng & Units 06/18/2019 05/27/2019 05/16/2019  WBC 3.4 - 10.8 x10E3/uL 4.7 4.8 -  Hemoglobin 11.1 - 15.9 g/dL 12.1 10.7(L) 8.6(L)  Hematocrit 34.0 - 46.6 % 39.1 35.5 31.0(L)  Platelets 150 - 450 x10E3/uL 151 194 -    CMP Latest Ref Rng & Units 05/27/2019 05/15/2019 05/14/2019  Glucose 65 - 99 mg/dL 113(H) 110(H) 67  BUN 8 - 27 mg/dL _1 Creatinine 0.57 - 1.00 mg/dL 0.94 0.87 0.94  Sodium 134 - 144 mmol/L 141 138 139  Potassium 3.5 - 5.2 mmol/L 5.0 4.2 4.9  Chloride 96 - 106 mmol/L 103 103 99  CO2 20 - 29 mmol/L _2 Calcium 8.7 - 10.3 mg/dL 9.5 9.2 9.1  Total Protein 6.5 - 8.1 g/dL - 6.9 6.7  Total Bilirubin 0.3 - 1.2 mg/dL - 0.5 0.3  Alkaline Phos 38 - 126 U/L - 97 111  AST 15 - 41 U/L - 17 19  ALT 0 - 44 U/L - 16 9    Hepatic Function Latest Ref Rng & Units 05/15/2019 05/14/2019 07/24/2018  Total Protein 6.5 - 8.1 g/dL 6.9 6.7 6.8  Albumin 3.5 - 5.0 g/dL 3.9 4.2 4.4  AST 15 - 41 U/L _3 ALT 0 - 44 U/L _4 Alk Phosphatase 38 - 126 U/L 97 111 127(H)  Total Bilirubin 0.3 - 1.2 mg/dL 0.5 0.3 0.4  Bilirubin, Direct 0.00 - 0.40 mg/dL - 0.09 -   Large platelets noted on CBC.  Serum iron 97, TIBC 382 and saturation 25%. Serum ferritin 26.  Lab studies from 05/27/2019 as follows Serum iron 13 TIBC 528 and saturation 2% Serum ferritin was 6.  Assessment:  #1.  Iron deficiency anemia.  She has lifelong history of iron deficiency anemia.  She mostly likely has impaired iron absorption although she could have been losing blood from her upper GI tract given endoscopic findings of recent EGD.  Colonoscopy was not undertaken as she had one in December 2017.  Hemoglobin and iron studies are back to normal.  #2.  Large hiatal hernia.  She is on antireflux measures and double dose PPI.   PPI dose will be decreased as below.  #3.  Duodenal ulcer.  Gastric biopsy was negative for H. pylori infection.  Also felt to be due to NSAID use.  She is not take ibuprofen anymore.  #4. large platelets noted on CBC.?  Significance.  Plan:  Decrease pantoprazole to 40 mg by mouth daily 30 minutes before breakfast after 4 weeks. Increase Flintstone chewable with iron to 1 tablet twice daily. Patient will have CBC with differential and peripheral smear by pathologist in [redacted] weeks along with serum ferritin. Office visit in 1 year.

## 2019-06-26 ENCOUNTER — Other Ambulatory Visit (INDEPENDENT_AMBULATORY_CARE_PROVIDER_SITE_OTHER): Payer: Self-pay | Admitting: *Deleted

## 2019-06-26 DIAGNOSIS — D509 Iron deficiency anemia, unspecified: Secondary | ICD-10-CM

## 2019-06-26 DIAGNOSIS — K219 Gastro-esophageal reflux disease without esophagitis: Secondary | ICD-10-CM

## 2019-06-26 DIAGNOSIS — K269 Duodenal ulcer, unspecified as acute or chronic, without hemorrhage or perforation: Secondary | ICD-10-CM

## 2019-07-16 ENCOUNTER — Other Ambulatory Visit (INDEPENDENT_AMBULATORY_CARE_PROVIDER_SITE_OTHER): Payer: Self-pay | Admitting: *Deleted

## 2019-07-16 ENCOUNTER — Encounter (INDEPENDENT_AMBULATORY_CARE_PROVIDER_SITE_OTHER): Payer: Self-pay | Admitting: *Deleted

## 2019-07-16 DIAGNOSIS — D509 Iron deficiency anemia, unspecified: Secondary | ICD-10-CM

## 2019-07-16 DIAGNOSIS — K269 Duodenal ulcer, unspecified as acute or chronic, without hemorrhage or perforation: Secondary | ICD-10-CM

## 2019-07-16 DIAGNOSIS — K219 Gastro-esophageal reflux disease without esophagitis: Secondary | ICD-10-CM

## 2019-07-17 DIAGNOSIS — E119 Type 2 diabetes mellitus without complications: Secondary | ICD-10-CM | POA: Diagnosis not present

## 2019-07-24 ENCOUNTER — Encounter: Payer: Self-pay | Admitting: Family Medicine

## 2019-07-24 DIAGNOSIS — K269 Duodenal ulcer, unspecified as acute or chronic, without hemorrhage or perforation: Secondary | ICD-10-CM | POA: Diagnosis not present

## 2019-07-24 DIAGNOSIS — D509 Iron deficiency anemia, unspecified: Secondary | ICD-10-CM | POA: Diagnosis not present

## 2019-07-25 LAB — CBC
Hematocrit: 43.4 % (ref 34.0–46.6)
Hemoglobin: 13.2 g/dL (ref 11.1–15.9)
MCH: 25.5 pg — ABNORMAL LOW (ref 26.6–33.0)
MCHC: 30.4 g/dL — ABNORMAL LOW (ref 31.5–35.7)
MCV: 84 fL (ref 79–97)
Platelets: 169 10*3/uL (ref 150–450)
RBC: 5.18 x10E6/uL (ref 3.77–5.28)
RDW: 16.5 % — ABNORMAL HIGH (ref 11.7–15.4)
WBC: 5.2 10*3/uL (ref 3.4–10.8)

## 2019-07-25 LAB — FERRITIN: Ferritin: 24 ng/mL (ref 15–150)

## 2019-07-25 LAB — SPECIMEN STATUS REPORT

## 2019-07-26 LAB — PATHOLOGIST SMEAR REVIEW
Basophils Absolute: 0 10*3/uL (ref 0.0–0.2)
Basos: 0 %
EOS (ABSOLUTE): 0.1 10*3/uL (ref 0.0–0.4)
Eos: 3 %
Hematocrit: 42.5 % (ref 34.0–46.6)
Hemoglobin: 13.2 g/dL (ref 11.1–15.9)
Immature Grans (Abs): 0 10*3/uL (ref 0.0–0.1)
Immature Granulocytes: 0 %
Lymphocytes Absolute: 1.6 10*3/uL (ref 0.7–3.1)
Lymphs: 30 %
MCH: 25.8 pg — ABNORMAL LOW (ref 26.6–33.0)
MCHC: 31.1 g/dL — ABNORMAL LOW (ref 31.5–35.7)
MCV: 83 fL (ref 79–97)
Monocytes Absolute: 0.4 10*3/uL (ref 0.1–0.9)
Monocytes: 7 %
Neutrophils Absolute: 3.2 10*3/uL (ref 1.4–7.0)
Neutrophils: 60 %
Platelets: 147 10*3/uL — ABNORMAL LOW (ref 150–450)
RBC: 5.12 x10E6/uL (ref 3.77–5.28)
RDW: 16.5 % — ABNORMAL HIGH (ref 11.7–15.4)
WBC: 5.3 10*3/uL (ref 3.4–10.8)

## 2019-07-29 ENCOUNTER — Other Ambulatory Visit: Payer: Self-pay

## 2019-07-29 ENCOUNTER — Other Ambulatory Visit (INDEPENDENT_AMBULATORY_CARE_PROVIDER_SITE_OTHER): Payer: Self-pay | Admitting: *Deleted

## 2019-07-29 ENCOUNTER — Telehealth (INDEPENDENT_AMBULATORY_CARE_PROVIDER_SITE_OTHER): Payer: Federal, State, Local not specified - PPO | Admitting: Family Medicine

## 2019-07-29 ENCOUNTER — Telehealth: Payer: Self-pay | Admitting: *Deleted

## 2019-07-29 DIAGNOSIS — D509 Iron deficiency anemia, unspecified: Secondary | ICD-10-CM

## 2019-07-29 DIAGNOSIS — I10 Essential (primary) hypertension: Secondary | ICD-10-CM | POA: Diagnosis not present

## 2019-07-29 DIAGNOSIS — M79604 Pain in right leg: Secondary | ICD-10-CM

## 2019-07-29 DIAGNOSIS — K269 Duodenal ulcer, unspecified as acute or chronic, without hemorrhage or perforation: Secondary | ICD-10-CM

## 2019-07-29 DIAGNOSIS — E038 Other specified hypothyroidism: Secondary | ICD-10-CM | POA: Diagnosis not present

## 2019-07-29 DIAGNOSIS — G2581 Restless legs syndrome: Secondary | ICD-10-CM | POA: Diagnosis not present

## 2019-07-29 DIAGNOSIS — M79605 Pain in left leg: Secondary | ICD-10-CM

## 2019-07-29 MED ORDER — ROPINIROLE HCL 3 MG PO TABS: 3.0000 mg | ORAL_TABLET | Freq: Every day | ORAL | 1 refills | Status: DC

## 2019-07-29 NOTE — Progress Notes (Signed)
   Subjective:    Patient ID: Karen Vazquez, female    DOB: 1956-03-03, 64 y.o.   MRN: [id number]  Hypertension This is a chronic problem. The current episode started more than 1 year ago. The problem is controlled. Treatments tried: Amlodipine. There are no compliance problems.   Patient reports BP has been running around 130/80 when checking it at home.   Patient would like to discuss increased problems with her restless leg.  She states she is having worsening problems with restless legs She did not tolerate gabapentin or Lyrica Also her current Requip is not quite getting the job done She does take Ambien at nighttime but is trying to cut back currently on 5 mg  Patient is concerned she may have symptoms in her leg consistent with claudication She states that her legs ache with activity she would like to have a circulation test done on them Review of Systems Relates restless legs relates intermittent leg pain    Objective:   Physical Exam  Virtual Visit via Video Note  I connected with Karen Vazquez on [id number] at  8:40 AM EDT by a video enabled telemedicine application and verified that I am speaking with the correct person using two identifiers.  Location: Patient: home Provider: office    I discussed the limitations of evaluation and management by telemedicine and the availability of in person appointments. The patient expressed understanding and agreed to proceed.  History of Present Illness:    Observations/Objective:   Assessment and Plan:   Follow Up Instructions:    I discussed the assessment and treatment plan with the patient. The patient was provided an opportunity to ask questions and all were answered. The patient agreed with the plan and demonstrated an understanding of the instructions.   The patient was advised to call back or seek an in-person evaluation if the symptoms worsen or if the condition fails to improve as anticipated.  I provided 20  minutes of non-face-to-face time during this encounter.         Assessment & Plan:  HTN-under good control medication  Thyroid takes her medication previous labs look good  Restless legs giving her more trouble it is possible she may be experiencing some augmentation for help the Requip we will reduce the dose from 4 mg to 3 mg see if that makes a difference  She did not tolerate gabapentin or Lyrica due to side effects  She does have a low normal ferritin with restless legs if her ferritin could get near the 75 mark or higher that would help her we will send a message to gastroenterology to see if they might be willing to do iron infusion for her because of her increased trouble with restless legs  Certainly if getting her ferritin up does not improve her restless legs then trying a long-acting benzodiazepine in place of the Ambien would be a good step  Patient does state that she is taking less of the PPI only doing 1/day  We will set up patient for ABI of the legs

## 2019-07-29 NOTE — Telephone Encounter (Signed)
Ms. marilynn, merola are scheduled for a virtual visit with your provider today.    Just as we do with appointments in the office, we must obtain your consent to participate.  Your consent will be active for this visit and any virtual visit you may have with one of our providers in the next 365 days.    If you have a MyChart account, I can also send a copy of this consent to you electronically.  All virtual visits are billed to your insurance company just like a traditional visit in the office.  As this is a virtual visit, video technology does not allow for your provider to perform a traditional examination.  This may limit your provider's ability to fully assess your condition.  If your provider identifies any concerns that need to be evaluated in person or the need to arrange testing such as labs, EKG, etc, we will make arrangements to do so.    Although advances in technology are sophisticated, we cannot ensure that it will always work on either your end or our end.  If the connection with a video visit is poor, we may have to switch to a telephone visit.  With either a video or telephone visit, we are not always able to ensure that we have a secure connection.   I need to obtain your verbal consent now.   Are you willing to proceed with your visit today?   DARYL DESLAURIERS has provided verbal consent on 07/29/2019 for a virtual visit (video or telephone).   Patsy Lager, LPN [id number]  [id number]

## 2019-07-30 ENCOUNTER — Other Ambulatory Visit: Payer: Self-pay | Admitting: *Deleted

## 2019-07-30 DIAGNOSIS — M79605 Pain in left leg: Secondary | ICD-10-CM

## 2019-07-30 DIAGNOSIS — M79604 Pain in right leg: Secondary | ICD-10-CM

## 2019-08-01 DIAGNOSIS — Z6841 Body Mass Index (BMI) 40.0 and over, adult: Secondary | ICD-10-CM | POA: Diagnosis not present

## 2019-08-01 DIAGNOSIS — Z01419 Encounter for gynecological examination (general) (routine) without abnormal findings: Secondary | ICD-10-CM | POA: Diagnosis not present

## 2019-08-01 DIAGNOSIS — Z1231 Encounter for screening mammogram for malignant neoplasm of breast: Secondary | ICD-10-CM | POA: Diagnosis not present

## 2019-08-02 ENCOUNTER — Ambulatory Visit (HOSPITAL_COMMUNITY)
Admission: RE | Admit: 2019-08-02 | Discharge: 2019-08-02 | Disposition: A | Discharge status: Home / Self Care | Payer: Federal, State, Local not specified - PPO | Source: Ambulatory Visit | Attending: Family Medicine | Admitting: Family Medicine

## 2019-08-02 ENCOUNTER — Other Ambulatory Visit: Payer: Self-pay

## 2019-08-02 DIAGNOSIS — R600 Localized edema: Secondary | ICD-10-CM | POA: Diagnosis not present

## 2019-08-02 DIAGNOSIS — M79604 Pain in right leg: Secondary | ICD-10-CM | POA: Diagnosis not present

## 2019-08-02 DIAGNOSIS — M79605 Pain in left leg: Secondary | ICD-10-CM | POA: Diagnosis not present

## 2019-08-06 ENCOUNTER — Other Ambulatory Visit: Payer: Self-pay | Admitting: Family Medicine

## 2019-08-08 NOTE — Telephone Encounter (Signed)
Nurses please talk with patient The best I can tell from looking through the records it looks like the vitamin B12 is something she takes intermittently In other words she is not on it month after month after month after month So in most cases we recommend utilizing the higher dose vitamin D for up to 4 months then switching over to oral vitamin D 2000 units daily and then rechecking a vitamin D level in 3 months if she is fine with this approach go ahead  If she feels she always needs to stay on the vitamin D she may have 3 refills along with repeating a vitamin D in 3 months thank you  Feel free to final this message back to me should there be any issues or problems thank you

## 2019-08-09 ENCOUNTER — Other Ambulatory Visit: Payer: Self-pay | Admitting: *Deleted

## 2019-08-09 DIAGNOSIS — E559 Vitamin D deficiency, unspecified: Secondary | ICD-10-CM

## 2019-08-09 NOTE — Telephone Encounter (Signed)
Called and discussed with pt. Pt states she will go on otc vit d 2000 units daily. Orders for vit d put in and mailed to pt to do in 3 months.

## 2019-08-29 DIAGNOSIS — N9089 Other specified noninflammatory disorders of vulva and perineum: Secondary | ICD-10-CM | POA: Diagnosis not present

## 2019-08-29 DIAGNOSIS — Z6841 Body Mass Index (BMI) 40.0 and over, adult: Secondary | ICD-10-CM | POA: Diagnosis not present

## 2019-10-16 ENCOUNTER — Other Ambulatory Visit (INDEPENDENT_AMBULATORY_CARE_PROVIDER_SITE_OTHER): Payer: Self-pay | Admitting: *Deleted

## 2019-10-16 DIAGNOSIS — K269 Duodenal ulcer, unspecified as acute or chronic, without hemorrhage or perforation: Secondary | ICD-10-CM

## 2019-10-16 DIAGNOSIS — D509 Iron deficiency anemia, unspecified: Secondary | ICD-10-CM

## 2019-11-04 DIAGNOSIS — R7989 Other specified abnormal findings of blood chemistry: Secondary | ICD-10-CM | POA: Diagnosis not present

## 2019-11-04 DIAGNOSIS — K269 Duodenal ulcer, unspecified as acute or chronic, without hemorrhage or perforation: Secondary | ICD-10-CM | POA: Diagnosis not present

## 2019-11-04 DIAGNOSIS — E559 Vitamin D deficiency, unspecified: Secondary | ICD-10-CM | POA: Diagnosis not present

## 2019-11-04 DIAGNOSIS — D509 Iron deficiency anemia, unspecified: Secondary | ICD-10-CM | POA: Diagnosis not present

## 2019-11-05 LAB — CBC
Hematocrit: 42.4 % (ref 34.0–46.6)
Hemoglobin: 13.6 g/dL (ref 11.1–15.9)
MCH: 27.1 pg (ref 26.6–33.0)
MCHC: 32.1 g/dL (ref 31.5–35.7)
MCV: 85 fL (ref 79–97)
Platelets: 205 10*3/uL (ref 150–450)
RBC: 5.02 x10E6/uL (ref 3.77–5.28)
RDW: 14.6 % (ref 11.7–15.4)
WBC: 7 10*3/uL (ref 3.4–10.8)

## 2019-11-05 LAB — VITAMIN D 25 HYDROXY (VIT D DEFICIENCY, FRACTURES): Vit D, 25-Hydroxy: 34.3 ng/mL (ref 30.0–100.0)

## 2019-11-16 DIAGNOSIS — Z1159 Encounter for screening for other viral diseases: Secondary | ICD-10-CM | POA: Diagnosis not present

## 2019-12-02 ENCOUNTER — Other Ambulatory Visit: Payer: Self-pay

## 2019-12-02 ENCOUNTER — Other Ambulatory Visit: Payer: Self-pay | Admitting: Family Medicine

## 2019-12-02 ENCOUNTER — Other Ambulatory Visit: Payer: Federal, State, Local not specified - PPO

## 2019-12-02 DIAGNOSIS — Z20822 Contact with and (suspected) exposure to covid-19: Secondary | ICD-10-CM | POA: Diagnosis not present

## 2019-12-03 ENCOUNTER — Other Ambulatory Visit: Payer: Federal, State, Local not specified - PPO

## 2019-12-04 ENCOUNTER — Other Ambulatory Visit: Payer: Self-pay | Admitting: Family Medicine

## 2019-12-04 LAB — NOVEL CORONAVIRUS, NAA: SARS-CoV-2, NAA: NOT DETECTED

## 2019-12-04 LAB — SARS-COV-2, NAA 2 DAY TAT

## 2019-12-04 LAB — SPECIMEN STATUS REPORT

## 2019-12-08 DIAGNOSIS — Z20822 Contact with and (suspected) exposure to covid-19: Secondary | ICD-10-CM | POA: Diagnosis not present

## 2019-12-12 ENCOUNTER — Encounter: Payer: Self-pay | Admitting: Family Medicine

## 2020-01-10 DIAGNOSIS — H2513 Age-related nuclear cataract, bilateral: Secondary | ICD-10-CM | POA: Diagnosis not present

## 2020-01-10 DIAGNOSIS — H02831 Dermatochalasis of right upper eyelid: Secondary | ICD-10-CM | POA: Diagnosis not present

## 2020-01-10 DIAGNOSIS — H02834 Dermatochalasis of left upper eyelid: Secondary | ICD-10-CM | POA: Diagnosis not present

## 2020-01-10 DIAGNOSIS — E119 Type 2 diabetes mellitus without complications: Secondary | ICD-10-CM | POA: Diagnosis not present

## 2020-01-27 ENCOUNTER — Other Ambulatory Visit: Payer: Self-pay | Admitting: Family Medicine

## 2020-02-01 ENCOUNTER — Encounter: Payer: Self-pay | Admitting: Family Medicine

## 2020-02-03 ENCOUNTER — Telehealth: Payer: Self-pay

## 2020-02-03 ENCOUNTER — Other Ambulatory Visit: Payer: Self-pay | Admitting: Family Medicine

## 2020-02-03 MED ORDER — ROPINIROLE HCL 3 MG PO TABS
3.0000 mg | ORAL_TABLET | Freq: Every day | ORAL | 1 refills | Status: DC
Start: 2020-02-03 — End: 2020-02-11

## 2020-02-03 NOTE — Telephone Encounter (Signed)
I actually already sent in a refill earlier today via electronic record at lunch-and send her a MyChart message letting her know we have done so thank you So please have her check with pharmacy-if they still have not received that-and then please let me know again

## 2020-02-03 NOTE — Telephone Encounter (Signed)
Patient is requesting refill on Ropinrole 3mg , she is completely out now. Patient sent a my chart message in on 11/13 also. Please advise [place]

## 2020-02-04 ENCOUNTER — Telehealth: Payer: Self-pay

## 2020-02-04 ENCOUNTER — Other Ambulatory Visit: Payer: Self-pay | Admitting: *Deleted

## 2020-02-04 DIAGNOSIS — Z13220 Encounter for screening for lipoid disorders: Secondary | ICD-10-CM

## 2020-02-04 DIAGNOSIS — I10 Essential (primary) hypertension: Secondary | ICD-10-CM

## 2020-02-04 DIAGNOSIS — E038 Other specified hypothyroidism: Secondary | ICD-10-CM

## 2020-02-04 DIAGNOSIS — E559 Vitamin D deficiency, unspecified: Secondary | ICD-10-CM

## 2020-02-04 DIAGNOSIS — E7849 Other hyperlipidemia: Secondary | ICD-10-CM

## 2020-02-04 NOTE — Telephone Encounter (Signed)
Sent  My chart message to schedule appointment 

## 2020-02-04 NOTE — Telephone Encounter (Signed)
TSH, met 7, vitamin D, lipid Screening hyperlipidemia, vitamin D deficiency, hypothyroidism, high risk med

## 2020-02-04 NOTE — Telephone Encounter (Signed)
Last labs from our office 4/21: cbc, met 7, ferritin

## 2020-02-04 NOTE — Telephone Encounter (Signed)
Pt is schedule for 11/23 Med Check will she need blood work?   Pt call back [phone]

## 2020-02-04 NOTE — Telephone Encounter (Signed)
Labs ordered, pt notified.

## 2020-02-06 DIAGNOSIS — I10 Essential (primary) hypertension: Secondary | ICD-10-CM | POA: Diagnosis not present

## 2020-02-06 DIAGNOSIS — Z13220 Encounter for screening for lipoid disorders: Secondary | ICD-10-CM | POA: Diagnosis not present

## 2020-02-06 DIAGNOSIS — E559 Vitamin D deficiency, unspecified: Secondary | ICD-10-CM | POA: Diagnosis not present

## 2020-02-06 DIAGNOSIS — E038 Other specified hypothyroidism: Secondary | ICD-10-CM | POA: Diagnosis not present

## 2020-02-06 DIAGNOSIS — D509 Iron deficiency anemia, unspecified: Secondary | ICD-10-CM | POA: Diagnosis not present

## 2020-02-07 ENCOUNTER — Encounter: Payer: Self-pay | Admitting: Family Medicine

## 2020-02-07 LAB — LIPID PANEL
Chol/HDL Ratio: 4.5 ratio — ABNORMAL HIGH (ref 0.0–4.4)
Cholesterol, Total: 191 mg/dL (ref 100–199)
HDL: 42 mg/dL (ref >39)
LDL Chol Calc (NIH): 128 mg/dL — ABNORMAL HIGH (ref 0–99)
Triglycerides: 114 mg/dL (ref 0–149)
VLDL Cholesterol Cal: 21 mg/dL (ref 5–40)

## 2020-02-07 LAB — BASIC METABOLIC PANEL
BUN/Creatinine Ratio: 14 (ref 12–28)
BUN: 13 mg/dL (ref 8–27)
CO2: 25 mmol/L (ref 20–29)
Calcium: 9.7 mg/dL (ref 8.7–10.3)
Chloride: 103 mmol/L (ref 96–106)
Creatinine, Ser: 0.92 mg/dL (ref 0.57–1.00)
GFR calc Af Amer: 76 mL/min/{1.73_m2} (ref >59)
GFR calc non Af Amer: 66 mL/min/{1.73_m2} (ref >59)
Glucose: 84 mg/dL (ref 65–99)
Potassium: 5 mmol/L (ref 3.5–5.2)
Sodium: 141 mmol/L (ref 134–144)

## 2020-02-07 LAB — VITAMIN D 25 HYDROXY (VIT D DEFICIENCY, FRACTURES): Vit D, 25-Hydroxy: 32.1 ng/mL (ref 30.0–100.0)

## 2020-02-07 LAB — TSH: TSH: 2.62 u[IU]/mL (ref 0.450–4.500)

## 2020-02-10 NOTE — Telephone Encounter (Signed)
Has appointment 11/23 for medication follow up

## 2020-02-11 ENCOUNTER — Ambulatory Visit: Payer: Federal, State, Local not specified - PPO | Admitting: Family Medicine

## 2020-02-11 ENCOUNTER — Other Ambulatory Visit: Payer: Self-pay

## 2020-02-11 ENCOUNTER — Encounter: Payer: Self-pay | Admitting: Family Medicine

## 2020-02-11 VITALS — BP 110/78 | HR 79 | Temp 97.3°F | Wt 253.8 lb

## 2020-02-11 DIAGNOSIS — M79605 Pain in left leg: Secondary | ICD-10-CM

## 2020-02-11 DIAGNOSIS — M79604 Pain in right leg: Secondary | ICD-10-CM | POA: Diagnosis not present

## 2020-02-11 DIAGNOSIS — D509 Iron deficiency anemia, unspecified: Secondary | ICD-10-CM

## 2020-02-11 DIAGNOSIS — I10 Essential (primary) hypertension: Secondary | ICD-10-CM

## 2020-02-11 DIAGNOSIS — G4733 Obstructive sleep apnea (adult) (pediatric): Secondary | ICD-10-CM

## 2020-02-11 DIAGNOSIS — E038 Other specified hypothyroidism: Secondary | ICD-10-CM

## 2020-02-11 MED ORDER — THYROID 30 MG PO TABS: ORAL_TABLET | ORAL | 5 refills | Status: DC

## 2020-02-11 MED ORDER — PANTOPRAZOLE SODIUM 40 MG PO TBEC: 40.0000 mg | DELAYED_RELEASE_TABLET | Freq: Two times a day (BID) | ORAL | 5 refills | Status: DC

## 2020-02-11 MED ORDER — ZOLPIDEM TARTRATE 5 MG PO TABS
ORAL_TABLET | ORAL | 5 refills | Status: DC
Start: 2020-02-11 — End: 2020-07-21

## 2020-02-11 MED ORDER — ROPINIROLE HCL 3 MG PO TABS
ORAL_TABLET | ORAL | 1 refills | Status: DC
Start: 2020-02-11 — End: 2020-04-16

## 2020-02-11 MED ORDER — AMLODIPINE BESYLATE 2.5 MG PO TABS
2.5000 mg | ORAL_TABLET | Freq: Every day | ORAL | 5 refills | Status: DC
Start: 2020-02-11 — End: 2020-07-21

## 2020-02-11 NOTE — Progress Notes (Signed)
Subjective:    Patient ID: Karen Vazquez, female    DOB: 23-Apr-1955, 64 y.o.   MRN: [id number]  Hypertension This is a chronic problem. Pertinent negatives include no chest pain or shortness of breath. Treatments tried: amlodipine. There are no compliance problems.   Hyperlipidemia Pertinent negatives include no chest pain or shortness of breath.   Leg pain has constant lower leg pain bilateral.  Unfortunately does not seem to get better with the current medication but she is taking the medicine right at bedtime when she takes her Ambien so therefore it is really hard to know what the medicine is helping her  Weight gain she is frustrated by the weight gain she has she is always suffered with obesity but it has gotten worse over the past few years she states that she is always taking care of others and not taking care of her self she suffers with severe fatigue related to her low iron.    Also she is not using her CPAP on a regular basis therefore she needs to do this in order to lessen her fatigue  fatigued her fatigue is multifactorial partly from sleep apnea obesity deconditioning iron deficient anemia but her thyroid seems to be under decent control  Results for orders placed or performed in visit on 28/36/62  Basic metabolic panel  Result Value Ref Range   Glucose 84 65 - 99 mg/dL   BUN 13 8 - 27 mg/dL   Creatinine, Ser 0.92 0.57 - 1.00 mg/dL   GFR calc non Af Amer 66 >59 mL/min/1.73   GFR calc Af Amer 76 >59 mL/min/1.73   BUN/Creatinine Ratio 14 12 - 28   Sodium 141 134 - 144 mmol/L   Potassium 5.0 3.5 - 5.2 mmol/L   Chloride 103 96 - 106 mmol/L   CO2 25 20 - 29 mmol/L   Calcium 9.7 8.7 - 10.3 mg/dL  VITAMIN D 25 Hydroxy (Vit-D Deficiency, Fractures)  Result Value Ref Range   Vit D, 25-Hydroxy 32.1 30.0 - 100.0 ng/mL  TSH  Result Value Ref Range   TSH 2.620 0.450 - 4.500 uIU/mL  Lipid panel  Result Value Ref Range   Cholesterol, Total 191 100 - 199 mg/dL    Triglycerides 114 0 - 149 mg/dL   HDL 42 >39 mg/dL   VLDL Cholesterol Cal 21 5 - 40 mg/dL   LDL Chol Calc (NIH) 128 (H) 0 - 99 mg/dL   Chol/HDL Ratio 4.5 (H) 0.0 - 4.4 ratio  Specimen status report  Result Value Ref Range   specimen status report Comment   Iron and TIBC  Result Value Ref Range   Total Iron Binding Capacity 399 250 - 450 ug/dL   UIBC 313 118 - 369 ug/dL   Iron 86 27 - 139 ug/dL   Iron Saturation 22 15 - 55 %  Ferritin  Result Value Ref Range   Ferritin 29 15.0 - 150.0 ng/mL  Specimen status report  Result Value Ref Range   specimen status report Comment      Review of Systems  Constitutional: Negative for activity change, appetite change and fatigue.  HENT: Negative for congestion and rhinorrhea.   Respiratory: Negative for cough and shortness of breath.   Cardiovascular: Negative for chest pain and leg swelling.  Gastrointestinal: Negative for abdominal pain and diarrhea.  Endocrine: Negative for polydipsia and polyphagia.  Skin: Negative for color change.  Neurological: Negative for dizziness and weakness.  Psychiatric/Behavioral: Negative for behavioral problems and  confusion.       Objective:   Physical Exam Vitals reviewed.  Constitutional:      General: She is not in acute distress. HENT:     Head: Normocephalic and atraumatic.  Eyes:     General:        Right eye: No discharge.        Left eye: No discharge.  Neck:     Trachea: No tracheal deviation.  Cardiovascular:     Rate and Rhythm: Normal rate and regular rhythm.     Heart sounds: Normal heart sounds. No murmur heard.   Pulmonary:     Effort: Pulmonary effort is normal. No respiratory distress.     Breath sounds: Normal breath sounds.  Lymphadenopathy:     Cervical: No cervical adenopathy.  Skin:    General: Skin is warm and dry.  Neurological:     Mental Status: She is alert.     Coordination: Coordination normal.  Psychiatric:        Behavior: Behavior normal.      Patient had mammography earlier this year we will try to get the results sent to [place]      Assessment & Plan:  1. Essential hypertension Blood pressure good control continue current measures watch diet try to bring weight down  2. Other specified hypothyroidism Continue thyroid medicine as is TSH looks good  3. Morbid obesity ([place]) Portion control very important increase activity important as well  4. Pain in both lower extremities We will encourage patient to take her Requip earlier in the evening to see if this helps she will send [place] an update within 2 to 3 weeks how this is doing  5. Iron deficiency anemia, unspecified iron deficiency anemia type This is in the low normal range I would recommend an infusion we will send connections to Dr. Melony Overly regarding this  6. Obstructive sleep apnea Patient not using this currently I recommend she use it on a regular basis to help with her fatigue  Follow-up within 4 months

## 2020-02-11 NOTE — Patient Instructions (Signed)
DASH Eating Plan DASH stands for "Dietary Approaches to Stop Hypertension." The DASH eating plan is a healthy eating plan that has been shown to reduce high blood pressure (hypertension). It may also reduce your risk for type 2 diabetes, heart disease, and stroke. The DASH eating plan may also help with weight loss. What are tips for following this plan?  General guidelines  Avoid eating more than 2,300 mg (milligrams) of salt (sodium) a day. If you have hypertension, you may need to reduce your sodium intake to 1,500 mg a day.  Limit alcohol intake to no more than 1 drink a day for nonpregnant women and 2 drinks a day for men. One drink equals 12 oz of beer, 5 oz of wine, or 1 oz of hard liquor.  Work with your health care provider to maintain a healthy body weight or to lose weight. Ask what an ideal weight is for you.  Get at least 30 minutes of exercise that causes your heart to beat faster (aerobic exercise) most days of the week. Activities may include walking, swimming, or biking.  Work with your health care provider or diet and nutrition specialist (dietitian) to adjust your eating plan to your individual calorie needs. Reading food labels   Check food labels for the amount of sodium per serving. Choose foods with less than 5 percent of the Daily Value of sodium. Generally, foods with less than 300 mg of sodium per serving fit into this eating plan.  To find whole grains, look for the word "whole" as the first word in the ingredient list. Shopping  Buy products labeled as "low-sodium" or "no salt added."  Buy fresh foods. Avoid canned foods and premade or frozen meals. Cooking  Avoid adding salt when cooking. Use salt-free seasonings or herbs instead of table salt or sea salt. Check with your health care provider or pharmacist before using salt substitutes.  Do not fry foods. Cook foods using healthy methods such as baking, boiling, grilling, and broiling instead.  Cook with  heart-healthy oils, such as olive, canola, soybean, or sunflower oil. Meal planning  Eat a balanced diet that includes: ? 5 or more servings of fruits and vegetables each day. At each meal, try to fill half of your plate with fruits and vegetables. ? Up to 6-8 servings of whole grains each day. ? Less than 6 oz of lean meat, poultry, or fish each day. A 3-oz serving of meat is about the same size as a deck of cards. One egg equals 1 oz. ? 2 servings of low-fat dairy each day. ? A serving of nuts, seeds, or beans 5 times each week. ? Heart-healthy fats. Healthy fats called Omega-3 fatty acids are found in foods such as flaxseeds and coldwater fish, like sardines, salmon, and mackerel.  Limit how much you eat of the following: ? Canned or prepackaged foods. ? Food that is high in trans fat, such as fried foods. ? Food that is high in saturated fat, such as fatty meat. ? Sweets, desserts, sugary drinks, and other foods with added sugar. ? Full-fat dairy products.  Do not salt foods before eating.  Try to eat at least 2 vegetarian meals each week.  Eat more home-cooked food and less restaurant, buffet, and fast food.  When eating at a restaurant, ask that your food be prepared with less salt or no salt, if possible. What foods are recommended? The items listed may not be a complete list. Talk with your dietitian about   what dietary choices are best for you. Grains Whole-grain or whole-wheat bread. Whole-grain or whole-wheat pasta. Brown rice. Oatmeal. Quinoa. Bulgur. Whole-grain and low-sodium cereals. Pita bread. Low-fat, low-sodium crackers. Whole-wheat flour tortillas. Vegetables Fresh or frozen vegetables (raw, steamed, roasted, or grilled). Low-sodium or reduced-sodium tomato and vegetable juice. Low-sodium or reduced-sodium tomato sauce and tomato paste. Low-sodium or reduced-sodium canned vegetables. Fruits All fresh, dried, or frozen fruit. Canned fruit in natural juice (without  added sugar). Meat and other protein foods Skinless chicken or turkey. Ground chicken or turkey. Pork with fat trimmed off. Fish and seafood. Egg whites. Dried beans, peas, or lentils. Unsalted nuts, nut butters, and seeds. Unsalted canned beans. Lean cuts of beef with fat trimmed off. Low-sodium, lean deli meat. Dairy Low-fat (1%) or fat-free (skim) milk. Fat-free, low-fat, or reduced-fat cheeses. Nonfat, low-sodium ricotta or cottage cheese. Low-fat or nonfat yogurt. Low-fat, low-sodium cheese. Fats and oils Soft margarine without trans fats. Vegetable oil. Low-fat, reduced-fat, or light mayonnaise and salad dressings (reduced-sodium). Canola, safflower, olive, soybean, and sunflower oils. Avocado. Seasoning and other foods Herbs. Spices. Seasoning mixes without salt. Unsalted popcorn and pretzels. Fat-free sweets. What foods are not recommended? The items listed may not be a complete list. Talk with your dietitian about what dietary choices are best for you. Grains Baked goods made with fat, such as croissants, muffins, or some breads. Dry pasta or rice meal packs. Vegetables Creamed or fried vegetables. Vegetables in a cheese sauce. Regular canned vegetables (not low-sodium or reduced-sodium). Regular canned tomato sauce and paste (not low-sodium or reduced-sodium). Regular tomato and vegetable juice (not low-sodium or reduced-sodium). Pickles. Olives. Fruits Canned fruit in a light or heavy syrup. Fried fruit. Fruit in cream or butter sauce. Meat and other protein foods Fatty cuts of meat. Ribs. Fried meat. Bacon. Sausage. Bologna and other processed lunch meats. Salami. Fatback. Hotdogs. Bratwurst. Salted nuts and seeds. Canned beans with added salt. Canned or smoked fish. Whole eggs or egg yolks. Chicken or turkey with skin. Dairy Whole or 2% milk, cream, and half-and-half. Whole or full-fat cream cheese. Whole-fat or sweetened yogurt. Full-fat cheese. Nondairy creamers. Whipped toppings.  Processed cheese and cheese spreads. Fats and oils Butter. Stick margarine. Lard. Shortening. Ghee. Bacon fat. Tropical oils, such as coconut, palm kernel, or palm oil. Seasoning and other foods Salted popcorn and pretzels. Onion salt, garlic salt, seasoned salt, table salt, and sea salt. Worcestershire sauce. Tartar sauce. Barbecue sauce. Teriyaki sauce. Soy sauce, including reduced-sodium. Steak sauce. Canned and packaged gravies. Fish sauce. Oyster sauce. Cocktail sauce. Horseradish that you find on the shelf. Ketchup. Mustard. Meat flavorings and tenderizers. Bouillon cubes. Hot sauce and Tabasco sauce. Premade or packaged marinades. Premade or packaged taco seasonings. Relishes. Regular salad dressings. Where to find more information:  National Heart, Lung, and Blood Institute: www.nhlbi.nih.gov  American Heart Association: www.heart.org Summary  The DASH eating plan is a healthy eating plan that has been shown to reduce high blood pressure (hypertension). It may also reduce your risk for type 2 diabetes, heart disease, and stroke.  With the DASH eating plan, you should limit salt (sodium) intake to 2,300 mg a day. If you have hypertension, you may need to reduce your sodium intake to 1,500 mg a day.  When on the DASH eating plan, aim to eat more fresh fruits and vegetables, whole grains, lean proteins, low-fat dairy, and heart-healthy fats.  Work with your health care provider or diet and nutrition specialist (dietitian) to adjust your eating plan to your   individual calorie needs. This information is not intended to replace advice given to you by your health care provider. Make sure you discuss any questions you have with your health care provider. Document Revised: 02/17/2017 Document Reviewed: 02/29/2016 Elsevier Patient Education  2020 Elsevier Inc.  

## 2020-02-15 LAB — SPECIMEN STATUS REPORT

## 2020-02-18 LAB — IRON AND TIBC
Iron Saturation: 22 % (ref 15–55)
Iron: 86 ug/dL (ref 27–139)
Total Iron Binding Capacity: 399 ug/dL (ref 250–450)
UIBC: 313 ug/dL (ref 118–369)

## 2020-02-18 LAB — SPECIMEN STATUS REPORT

## 2020-02-18 LAB — FERRITIN: Ferritin: 29 ng/mL (ref 15–150)

## 2020-02-19 ENCOUNTER — Other Ambulatory Visit (INDEPENDENT_AMBULATORY_CARE_PROVIDER_SITE_OTHER): Payer: Self-pay | Admitting: *Deleted

## 2020-02-19 DIAGNOSIS — D509 Iron deficiency anemia, unspecified: Secondary | ICD-10-CM

## 2020-02-20 ENCOUNTER — Other Ambulatory Visit (INDEPENDENT_AMBULATORY_CARE_PROVIDER_SITE_OTHER): Payer: Self-pay | Admitting: *Deleted

## 2020-02-20 DIAGNOSIS — D509 Iron deficiency anemia, unspecified: Secondary | ICD-10-CM

## 2020-02-20 DIAGNOSIS — K269 Duodenal ulcer, unspecified as acute or chronic, without hemorrhage or perforation: Secondary | ICD-10-CM

## 2020-02-24 DIAGNOSIS — D509 Iron deficiency anemia, unspecified: Secondary | ICD-10-CM | POA: Diagnosis not present

## 2020-02-24 DIAGNOSIS — K269 Duodenal ulcer, unspecified as acute or chronic, without hemorrhage or perforation: Secondary | ICD-10-CM | POA: Diagnosis not present

## 2020-02-25 LAB — CBC WITH DIFFERENTIAL/PLATELET
Basophils Absolute: 0 10*3/uL (ref 0.0–0.2)
Basos: 0 %
EOS (ABSOLUTE): 0.1 10*3/uL (ref 0.0–0.4)
Eos: 2 %
Hematocrit: 42.6 % (ref 34.0–46.6)
Hemoglobin: 13.6 g/dL (ref 11.1–15.9)
Immature Grans (Abs): 0 10*3/uL (ref 0.0–0.1)
Immature Granulocytes: 0 %
Lymphocytes Absolute: 2 10*3/uL (ref 0.7–3.1)
Lymphs: 29 %
MCH: 26.8 pg (ref 26.6–33.0)
MCHC: 31.9 g/dL (ref 31.5–35.7)
MCV: 84 fL (ref 79–97)
Monocytes Absolute: 0.5 10*3/uL (ref 0.1–0.9)
Monocytes: 7 %
Neutrophils Absolute: 4.2 10*3/uL (ref 1.4–7.0)
Neutrophils: 62 %
Platelets: 188 10*3/uL (ref 150–450)
RBC: 5.07 x10E6/uL (ref 3.77–5.28)
RDW: 14.2 % (ref 11.7–15.4)
WBC: 6.8 10*3/uL (ref 3.4–10.8)

## 2020-02-26 ENCOUNTER — Encounter: Payer: Self-pay | Admitting: Family Medicine

## 2020-02-29 NOTE — Telephone Encounter (Signed)
Nurses Please connect with Karen Vazquez  As for the vitamin D I would recommend utilizing 3000 units daily.  As for the Requip if it is not giving any benefit I would stop this medicine  As for the iron infusion it is true that it is unlikely to be of any benefit at this point and it would not be covered by insurance  It would be reasonable to try Lyrica low-dose Please order Lyrica 50 mg twice daily, #60, three refills The patient may start off with 50 mg one each evening for the first 10 days then increase it to twice daily if not seeing any benefit  Karen Vazquez should give [place] feedback regarding how things are going within the next 3 weeks  Nurses-please pend the medication, send it to me and I will sign off on the prescription and have it sent to her pharmacy  If Karen Vazquez has further questions please let me know  Thanks-Dr. Nicki Reaper

## 2020-03-02 ENCOUNTER — Other Ambulatory Visit: Payer: Self-pay | Admitting: *Deleted

## 2020-03-02 MED ORDER — PREGABALIN 50 MG PO CAPS: ORAL_CAPSULE | ORAL | 3 refills | Status: DC

## 2020-03-02 NOTE — Progress Notes (Signed)
Prescription was signed

## 2020-03-05 DIAGNOSIS — Z23 Encounter for immunization: Secondary | ICD-10-CM | POA: Diagnosis not present

## 2020-04-15 ENCOUNTER — Encounter: Payer: Self-pay | Admitting: Family Medicine

## 2020-04-16 ENCOUNTER — Ambulatory Visit (INDEPENDENT_AMBULATORY_CARE_PROVIDER_SITE_OTHER): Payer: Federal, State, Local not specified - PPO | Admitting: Family Medicine

## 2020-04-16 ENCOUNTER — Other Ambulatory Visit: Payer: Self-pay

## 2020-04-16 ENCOUNTER — Encounter: Payer: Self-pay | Admitting: Family Medicine

## 2020-04-16 VITALS — HR 81 | Temp 100.2°F | Resp 16

## 2020-04-16 DIAGNOSIS — J40 Bronchitis, not specified as acute or chronic: Secondary | ICD-10-CM | POA: Diagnosis not present

## 2020-04-16 DIAGNOSIS — R059 Cough, unspecified: Secondary | ICD-10-CM

## 2020-04-16 DIAGNOSIS — H66001 Acute suppurative otitis media without spontaneous rupture of ear drum, right ear: Secondary | ICD-10-CM | POA: Insufficient documentation

## 2020-04-16 MED ORDER — AMOXICILLIN-POT CLAVULANATE 875-125 MG PO TABS: 1.0000 | ORAL_TABLET | Freq: Two times a day (BID) | ORAL | 0 refills | Status: DC

## 2020-04-16 MED ORDER — PREDNISONE 20 MG PO TABS: 20.0000 mg | ORAL_TABLET | Freq: Every day | ORAL | 0 refills | Status: DC

## 2020-04-16 NOTE — Patient Instructions (Signed)
Recommend dextromethorphan for your cough (active ingredient).  Recommend supportive therapy while you are recovering:   1) Get lots of rest.  2) Take over the counter pain medication if needed, such as acetaminophen or ibuprofen. Read and follow instructions on the label and make sure not to combine other medications that may have same ingredients in it. It is important to not take too much of these ingredients.  3) Drink plenty of caffeine-free fluids. (If you have heart or kidney problems, follow the instructions of your specialist regarding amounts).  4) If you are hungry, eat a bland diet, such as the BRAT diet (bananas, rice, applesauce, toast).  5) Let us know if you are not feeling better in a week.  Covid-19 warning:  Covid-19 is a virus that causes hypoxia (low oxygen level in blood) in some people. If you develop any changes in your usual breathing pattern: difficulty catching your breath, more short winded with activity or with resting, or anything that concerns you about your breathing, do not hesitate to go to the emergency department immediately for evaluation. Covid infection can also affect the way the brain functions if it lacks oxygen, such as, feeling dizzy, passing out, or feeling confused, if you experience any of these symptoms, please do not delay to seek treatment.  Some people experience gastrointestinal problems with Covid, such as vomiting and diarrhea, dehydration is a serious risk and should be avoided. If you are unable to keep liquids down you may need to go to the emergency department for intravenous fluids to avoid dehydration.   Please alert and involve your family and/or friends to help keep an eye on you while you recover from Covid-19. If you have any questions or concerns about your recovery, please do not hesitate to call the office for guidance.    Otitis Media, Adult  Otitis media is a condition in which the middle ear is red and swollen (inflamed)  and full of fluid. The middle ear is the part of the ear that contains bones for hearing as well as air that helps send sounds to the brain. The condition usually goes away on its own. What are the causes? This condition is caused by a blockage in the eustachian tube. The eustachian tube connects the middle ear to the back of the nose. It normally allows air into the middle ear. The blockage is caused by fluid or swelling. Problems that can cause blockage include:  A cold or infection that affects the nose, mouth, or throat.  Allergies.  An irritant, such as tobacco smoke.  Adenoids that have become large. The adenoids are soft tissue located in the back of the throat, behind the nose and the roof of the mouth.  Growth or swelling in the upper part of the throat, just behind the nose (nasopharynx).  Damage to the ear caused by change in pressure. This is called barotrauma. What are the signs or symptoms? Symptoms of this condition include:  Ear pain.  Fever.  Problems with hearing.  Being tired.  Fluid leaking from the ear.  Ringing in the ear. How is this treated? This condition can go away on its own within 3-5 days. But if the condition is caused by bacteria or does not go away on its own, or if it keeps coming back, your doctor may:  Give you antibiotic medicines.  Give you medicines for pain. Follow these instructions at home:  Take over-the-counter and prescription medicines only as told by your doctor.  If you were prescribed an antibiotic medicine, take it as told by your doctor. Do not stop taking the antibiotic even if you start to feel better.  Keep all follow-up visits as told by your doctor. This is important. Contact a doctor if:  You have bleeding from your nose.  There is a lump on your neck.  You are not feeling better in 5 days.  You feel worse instead of better. Get help right away if:  You have pain that is not helped with medicine.  You have  swelling, redness, or pain around your ear.  You get a stiff neck.  You cannot move part of your face (paralysis).  You notice that the bone behind your ear hurts when you touch it.  You get a very bad headache. Summary  Otitis media means that the middle ear is red, swollen, and full of fluid.  This condition usually goes away on its own.  If the problem does not go away, treatment may be needed. You may be given medicines to treat the infection or to treat your pain.  If you were prescribed an antibiotic medicine, take it as told by your doctor. Do not stop taking the antibiotic even if you start to feel better.  Keep all follow-up visits as told by your doctor. This is important. This information is not intended to replace advice given to you by your health care provider. Make sure you discuss any questions you have with your health care provider. Document Revised: 02/07/2019 Document Reviewed: 02/07/2019 Elsevier Patient Education  2021 [employer].

## 2020-04-16 NOTE — Progress Notes (Signed)
Patient ID: Karen Vazquez, female    DOB: Jul 20, 1955, 65 y.o.   MRN: [id number]   No chief complaint on file.  Subjective:  CC: sinus cold and cough  This is a new problem.  Presents today for an acute visit with complaint of "sinus cold "neck pain and cough.  Symptoms started 5 days ago, have been worse the last 2 days.  When asked to describe sinus cold, described as feeling blocked.  Associated symptoms include runny nose, pain behind the right ear and neck.  She has had a dry cough for some time.  Has tried Tylenol for the ear pain which has helped some.  Pertinent negatives include no fever, no fatigue, no fever, no shortness of breath, no chest pain, no abdominal pain, no nausea vomiting or diarrhea.   Patient presents today with respiratory illness Number of days present- 5 days worse last 2 days   Symptoms include- sinus cold, neck pain, cough.   Presence of worrisome signs (severe shortness of breath, lethargy, etc.) - none  Recent/current visit to urgent care or ER- none  Recent direct exposure to Covid- none  Any current Covid testing- none   Medical History Quinne has a past medical history of Anemia, Anxiety, Arthritis, Back pain, Blood transfusion (2011), Bulging lumbar disc, Chest pain, Depression, Dry mouth, Easy bruising, Excessive thirst, Fatigue, Frequent urination, GERD (gastroesophageal reflux disease), Headache, Heat intolerance, Hypertension, Hypothyroidism, Joint pain, Leg pain, Muscle stiffness, Nervousness, Palpitations, Pre-diabetes, Restless leg syndrome, Shortness of breath on exertion, Sleep apnea, Stomach ulcer, Stress, Swelling of both lower extremities, Trouble in sleeping, Vitamin D deficiency, and Weakness.   Outpatient Encounter Medications as of 04/16/2020  Medication Sig  . albuterol (PROVENTIL HFA;VENTOLIN HFA) 108 (90 Base) MCG/ACT inhaler Inhale 2 puffs into the lungs every 6 (six) hours as needed for wheezing or shortness of breath.  .  ALPRAZolam (XANAX) 0.5 MG tablet TAKE ONE TABLET BY MOUTH TWO TIMES DAILYAS NEEDED FOR ANXIETY OR SLEEP. (Patient taking differently: Take 0.5 mg by mouth 2 (two) times daily as needed for anxiety.)  . amLODipine (NORVASC) 2.5 MG tablet Take 1 tablet (2.5 mg total) by mouth daily.  Marland Kitchen amoxicillin-clavulanate (AUGMENTIN) 875-125 MG tablet Take 1 tablet by mouth 2 (two) times daily.  Francia Greaves THYROID 15 MG tablet TAKE ONE (1) TABLET EACH DAY (Patient taking differently: Take 15 mg by mouth daily before breakfast.)  . Cholecalciferol (VITAMIN D) 50 MCG (2000 UT) CAPS Take by mouth.  . fluticasone (FLONASE) 50 MCG/ACT nasal spray Place 2 sprays into both nostrils daily.  Marland Kitchen OVER THE COUNTER MEDICATION Vitamin b12  . pantoprazole (PROTONIX) 40 MG tablet Take 1 tablet (40 mg total) by mouth 2 (two) times daily before a meal.  . Pediatric Multiple Vit-C-FA (FLINSTONES GUMMIES OMEGA-3 DHA) CHEW Chew 18 mg by mouth daily.  . predniSONE (DELTASONE) 20 MG tablet Take 1 tablet (20 mg total) by mouth daily with breakfast.  . pregabalin (LYRICA) 50 MG capsule 1 twice daily  . thyroid (ARMOUR THYROID) 30 MG tablet TAKE ONE (1) TABLET BY MOUTH EVERY DAY  . zolpidem (AMBIEN) 5 MG tablet TAKE ONE TABLET (5MG  TOTAL) BY MOUTH AT BEDTIME AS NEEDED FOR SLEEP  . [DISCONTINUED] rOPINIRole (REQUIP) 3 MG tablet TAKE ONE TABLET (3MG  TOTAL) BY MOUTH AT BEDTIME   No facility-administered encounter medications on file as of 04/16/2020.     Review of Systems  Constitutional: Negative for chills, fatigue and fever.  HENT: Positive for congestion, ear  pain, rhinorrhea, sinus pressure, sinus pain and sore throat.   Respiratory: Positive for cough. Negative for shortness of breath.   Cardiovascular: Negative for chest pain.  Gastrointestinal: Negative for abdominal pain, diarrhea, nausea and vomiting.  Musculoskeletal: Positive for myalgias.  Neurological: Negative for headaches.     Vitals Pulse 81   Temp 100.2 F (37.9  C)   Resp 16   LMP 05/24/2010   SpO2 98%   Objective:   Physical Exam Vitals reviewed.  Constitutional:      General: She is not in acute distress.    Appearance: Normal appearance.  HENT:     Right Ear: No mastoid tenderness. Tympanic membrane is erythematous.     Left Ear: Tympanic membrane normal. No mastoid tenderness.     Nose: Nose normal.     Right Sinus: No maxillary sinus tenderness or frontal sinus tenderness.     Left Sinus: No maxillary sinus tenderness or frontal sinus tenderness.     Mouth/Throat:     Mouth: Mucous membranes are moist.  Cardiovascular:     Rate and Rhythm: Normal rate and regular rhythm.     Heart sounds: Normal heart sounds.  Pulmonary:     Effort: Pulmonary effort is normal.     Breath sounds: Normal breath sounds.  Skin:    General: Skin is warm and dry.  Neurological:     General: No focal deficit present.     Mental Status: She is alert.  Psychiatric:        Behavior: Behavior normal.      Assessment and Plan   1. Cough - predniSONE (DELTASONE) 20 MG tablet; Take 1 tablet (20 mg total) by mouth daily with breakfast.  Dispense: 7 tablet; Refill: 0 - Novel Coronavirus, NAA ([facility])  2. Non-recurrent acute suppurative otitis media of right ear without spontaneous rupture of tympanic membrane - amoxicillin-clavulanate (AUGMENTIN) 875-125 MG tablet; Take 1 tablet by mouth 2 (two) times daily.  Dispense: 20 tablet; Refill: 0 - Novel Coronavirus, NAA ([facility])  3. Bronchitis - predniSONE (DELTASONE) 20 MG tablet; Take 1 tablet (20 mg total) by mouth daily with breakfast.  Dispense: 7 tablet; Refill: 0 - Novel Coronavirus, NAA ([facility])   Due to right ear pain, and erythema, will treat with antibiotics for 10 days.  Dry hacking cough throughout examination, will treat bronchitis with prednisone.  She is instructed to buy over-the-counter cough medication with dextromethorphan as the active ingredient and take as instructed on the  bottle.  Agrees with plan of care discussed today. Understands warning signs to seek further care: chest pain, shortness of breath, any significant change in health.  Understands to follow-up if symptoms do not improve, or worsen.  Recommend supportive therapy, adequate hydration, saline nasal flushes, and humidification.  Covid test performed, will notify once results are available.  Covid warning given.  Covid-19 warning:  Covid-19 is a virus that causes hypoxia (low oxygen level in blood) in some people. If you develop any changes in your usual breathing pattern: difficulty catching your breath, more short winded with activity or with resting, or anything that concerns you about your breathing, do not hesitate to go to the emergency department immediately for evaluation. Covid infection can also affect the way the brain functions if it lacks oxygen, such as, feeling dizzy, passing out, or feeling confused, if you experience any of these symptoms, please do not delay to seek treatment.  Some people experience gastrointestinal problems with Covid, such as vomiting and diarrhea,  dehydration is a serious risk and should be avoided. If you are unable to keep liquids down you may need to go to the emergency department for intravenous fluids to avoid dehydration.   Please alert and involve your family and/or friends to help keep an eye on you while you recover from Covid-19. If you have any questions or concerns about your recovery, please do not hesitate to call the office for guidance.   Recommend supportive therapy while you are recovering:   1) Get lots of rest.  2) Take over the counter pain medication if needed, such as acetaminophen or ibuprofen. Read and follow instructions on the label and make sure not to combine other medications that may have same ingredients in it. It is important to not take too much of these ingredients.  3) Drink plenty of caffeine-free fluids. (If you have heart or  kidney problems, follow the instructions of your specialist regarding amounts).  4) If you are hungry, eat a bland diet, such as the BRAT diet (bananas, rice, applesauce, toast).  5) Let us know if you are not feeling better in a week.  Covid-19 Quarantine Instructions:   You have tested positive for Covid-19 infection. The current CDC guidelines for quarantine regardless of vaccination status are:   Please quarantine and isolate at home for a minimum of  5 days.   - If you have no symptoms or your symptoms are resolving after 5 days you   can leave the home (resolving means no shortness of breath, no fever, without taking fever reducing medication, no headache, etc). -Continue to wear a mask around others for an additional 5 days.  -If you were severely ill with Covid-19 you should isolate for at least 10 days.    Use over-the-counter medications for symptoms.If you develop respiratory issues/distress (see Covid warning), seek medical care in the Emergency Department.  If you must leave home or if you have to be around others please wear a mask. Please limit contact with immediate family members in the home, practice social distancing, frequent handwashing and clean hard surfaces touched frequently with household cleaning products. Members of your household will also need to quarantine for 5 days and test on day five if possible.  Covid-19 warning:  Covid-19 is a virus that causes hypoxia (low oxygen level in blood) in some people. If you develop any changes in your usual breathing pattern: difficulty catching your breath, more short winded with activity or with resting, or anything that concerns you about your breathing, do not hesitate to go to the emergency department immediately for evaluation. Covid infection can also affect the way the brain functions if it lacks oxygen, such as, feeling dizzy, passing out, or feeling confused, if you experience any of these symptoms, please do not  delay to seek treatment.  Some people experience gastrointestinal problems with Covid, such as vomiting and diarrhea, dehydration is a serious risk and should be avoided. If you are unable to keep liquids down you may need to go to the emergency department for intravenous fluids to avoid dehydration.   Please alert and involve your family and/or friends to help keep an eye on you while you recover from Covid-19. If you have any questions or concerns about your recovery, please do not hesitate to call the office for guidance.      Pecolia Ades, NP 04/16/2020

## 2020-04-18 LAB — NOVEL CORONAVIRUS, NAA: SARS-CoV-2, NAA: NOT DETECTED

## 2020-04-18 LAB — SARS-COV-2, NAA 2 DAY TAT

## 2020-04-18 LAB — SPECIMEN STATUS REPORT

## 2020-04-26 ENCOUNTER — Encounter: Payer: Self-pay | Admitting: Family Medicine

## 2020-05-25 DIAGNOSIS — Z9889 Other specified postprocedural states: Secondary | ICD-10-CM | POA: Diagnosis not present

## 2020-05-25 DIAGNOSIS — H2513 Age-related nuclear cataract, bilateral: Secondary | ICD-10-CM | POA: Diagnosis not present

## 2020-05-27 ENCOUNTER — Encounter (INDEPENDENT_AMBULATORY_CARE_PROVIDER_SITE_OTHER): Payer: Self-pay

## 2020-06-04 DIAGNOSIS — H25812 Combined forms of age-related cataract, left eye: Secondary | ICD-10-CM | POA: Diagnosis not present

## 2020-06-04 DIAGNOSIS — I10 Essential (primary) hypertension: Secondary | ICD-10-CM | POA: Diagnosis not present

## 2020-06-11 ENCOUNTER — Other Ambulatory Visit: Payer: Self-pay | Admitting: Family Medicine

## 2020-06-18 DIAGNOSIS — Z9889 Other specified postprocedural states: Secondary | ICD-10-CM | POA: Diagnosis not present

## 2020-06-18 DIAGNOSIS — H25811 Combined forms of age-related cataract, right eye: Secondary | ICD-10-CM | POA: Diagnosis not present

## 2020-06-18 DIAGNOSIS — I10 Essential (primary) hypertension: Secondary | ICD-10-CM | POA: Diagnosis not present

## 2020-06-22 DIAGNOSIS — G4733 Obstructive sleep apnea (adult) (pediatric): Secondary | ICD-10-CM | POA: Diagnosis not present

## 2020-06-23 ENCOUNTER — Ambulatory Visit (INDEPENDENT_AMBULATORY_CARE_PROVIDER_SITE_OTHER): Payer: Federal, State, Local not specified - PPO | Admitting: Internal Medicine

## 2020-07-17 ENCOUNTER — Encounter: Payer: Self-pay | Admitting: Family Medicine

## 2020-07-17 DIAGNOSIS — D509 Iron deficiency anemia, unspecified: Secondary | ICD-10-CM

## 2020-07-17 DIAGNOSIS — E7849 Other hyperlipidemia: Secondary | ICD-10-CM

## 2020-07-17 NOTE — Telephone Encounter (Signed)
Cbc, lipid, ferritin Iron def anemia, hyperlipidemia

## 2020-07-17 NOTE — Addendum Note (Signed)
Addended by: Vicente Males on: 07/17/2020 03:06 PM   Modules accepted: Orders

## 2020-07-21 ENCOUNTER — Ambulatory Visit (INDEPENDENT_AMBULATORY_CARE_PROVIDER_SITE_OTHER): Payer: Medicare Other | Admitting: Family Medicine

## 2020-07-21 ENCOUNTER — Other Ambulatory Visit: Payer: Self-pay

## 2020-07-21 VITALS — BP 128/82 | HR 91 | Temp 97.1°F | Wt 250.0 lb

## 2020-07-21 DIAGNOSIS — D509 Iron deficiency anemia, unspecified: Secondary | ICD-10-CM

## 2020-07-21 DIAGNOSIS — Z780 Asymptomatic menopausal state: Secondary | ICD-10-CM | POA: Diagnosis not present

## 2020-07-21 DIAGNOSIS — E038 Other specified hypothyroidism: Secondary | ICD-10-CM

## 2020-07-21 DIAGNOSIS — M778 Other enthesopathies, not elsewhere classified: Secondary | ICD-10-CM

## 2020-07-21 DIAGNOSIS — K219 Gastro-esophageal reflux disease without esophagitis: Secondary | ICD-10-CM

## 2020-07-21 DIAGNOSIS — Z23 Encounter for immunization: Secondary | ICD-10-CM | POA: Diagnosis not present

## 2020-07-21 DIAGNOSIS — G4700 Insomnia, unspecified: Secondary | ICD-10-CM

## 2020-07-21 DIAGNOSIS — S76219A Strain of adductor muscle, fascia and tendon of unspecified thigh, initial encounter: Secondary | ICD-10-CM

## 2020-07-21 LAB — CBC WITH DIFFERENTIAL/PLATELET
Basophils Absolute: 0 10*3/uL (ref 0.0–0.2)
Basos: 0 %
EOS (ABSOLUTE): 0.2 10*3/uL (ref 0.0–0.4)
Eos: 3 %
Hematocrit: 39.2 % (ref 34.0–46.6)
Hemoglobin: 12.5 g/dL (ref 11.1–15.9)
Immature Grans (Abs): 0 10*3/uL (ref 0.0–0.1)
Immature Granulocytes: 0 %
Lymphocytes Absolute: 1.7 10*3/uL (ref 0.7–3.1)
Lymphs: 31 %
MCH: 26.8 pg (ref 26.6–33.0)
MCHC: 31.9 g/dL (ref 31.5–35.7)
MCV: 84 fL (ref 79–97)
Monocytes Absolute: 0.4 10*3/uL (ref 0.1–0.9)
Monocytes: 7 %
Neutrophils Absolute: 3.2 10*3/uL (ref 1.4–7.0)
Neutrophils: 59 %
Platelets: 174 10*3/uL (ref 150–450)
RBC: 4.67 x10E6/uL (ref 3.77–5.28)
RDW: 13.4 % (ref 11.7–15.4)
WBC: 5.4 10*3/uL (ref 3.4–10.8)

## 2020-07-21 LAB — FERRITIN: Ferritin: 23 ng/mL (ref 15–150)

## 2020-07-21 LAB — LIPID PANEL
Chol/HDL Ratio: 5.1 ratio — ABNORMAL HIGH (ref 0.0–4.4)
Cholesterol, Total: 180 mg/dL (ref 100–199)
HDL: 35 mg/dL — ABNORMAL LOW (ref >39)
LDL Chol Calc (NIH): 123 mg/dL — ABNORMAL HIGH (ref 0–99)
Triglycerides: 118 mg/dL (ref 0–149)
VLDL Cholesterol Cal: 22 mg/dL (ref 5–40)

## 2020-07-21 MED ORDER — PANTOPRAZOLE SODIUM 40 MG PO TBEC: 40.0000 mg | DELAYED_RELEASE_TABLET | Freq: Every day | ORAL | 5 refills | Status: DC

## 2020-07-21 MED ORDER — AMLODIPINE BESYLATE 2.5 MG PO TABS: 2.5000 mg | ORAL_TABLET | Freq: Every day | ORAL | 5 refills | Status: DC

## 2020-07-21 MED ORDER — PREGABALIN 50 MG PO CAPS: ORAL_CAPSULE | ORAL | 3 refills | Status: DC

## 2020-07-21 MED ORDER — ZOLPIDEM TARTRATE 5 MG PO TABS: ORAL_TABLET | ORAL | 5 refills | Status: DC

## 2020-07-21 NOTE — Progress Notes (Addendum)
   Subjective:    Patient ID: Karen Vazquez, female    DOB: 1955-05-07, 65 y.o.   MRN: [id number]  Hypertension This is a chronic problem. The current episode started more than 1 year ago. Treatments tried: norvasc. There are no compliance problems.    Other specified hypothyroidism - Plan: DG Bone Density  Post-menopausal - Plan: DG Bone Density  Encounter for immunization - Plan: Pneumococcal polysaccharide vaccine 23-valent greater than or equal to 2yo subcutaneous/IM  Gastroesophageal reflux disease, unspecified whether esophagitis present  Insomnia, unspecified type  Iron deficiency anemia, unspecified iron deficiency anemia type  Morbid obesity ([place])  Tendinitis of right shoulder  Patient is trying to lose weight looking at the possibility of intermittent fasting and also trying to eat healthier  Patient also has discomfort in the pelvic region but it is felt more likely that this is musculoskeletal but she is seeing gynecology and they more than likely will do an ultrasound   Review of Systems     Objective:   Physical Exam  Lungs clear heart regular pulse normal BP good extremities no edema Mild groin discomfort more likely musculoskeletal    Assessment & Plan:  1. Other specified hypothyroidism Continue thyroid medicine - DG Bone Density  2. Post-menopausal Bone density ordered - DG Bone Density  3. Encounter for immunization Pneumonia vaccine - Pneumococcal polysaccharide vaccine 23-valent greater than or equal to 2yo subcutaneous/IM  4. Gastroesophageal reflux disease, unspecified whether esophagitis present Continue PPI taking it just once daily no dysphagia  5. Insomnia, unspecified type Takes the medication at nighttime helps with sleep states without it she did have a hard time  6. Iron deficiency anemia, unspecified iron deficiency anemia type Recent lab work looked good no need for iron infusion  7. Morbid obesity ([place]) Portion control  regular activity recommended  8. Tendinitis of right shoulder Range of motion exercises if not not dramatically better within 2 weeks notify us then referral to orthopedics if necessary range of motion exercise pamphlet given Groin discomfort- ultrasound with gynecologist-if this does not reveal the reason recommend physical therapy for further evaluation and treatment

## 2020-07-21 NOTE — Patient Instructions (Signed)

## 2020-07-29 ENCOUNTER — Ambulatory Visit (HOSPITAL_COMMUNITY)
Admission: RE | Admit: 2020-07-29 | Discharge: 2020-07-29 | Disposition: A | Discharge status: Home / Self Care | Payer: Medicare Other | Source: Ambulatory Visit | Attending: Family Medicine | Admitting: Family Medicine

## 2020-07-29 ENCOUNTER — Encounter: Payer: Self-pay | Admitting: Family Medicine

## 2020-07-29 DIAGNOSIS — Z780 Asymptomatic menopausal state: Secondary | ICD-10-CM | POA: Diagnosis not present

## 2020-07-29 DIAGNOSIS — S76219A Strain of adductor muscle, fascia and tendon of unspecified thigh, initial encounter: Secondary | ICD-10-CM

## 2020-07-29 DIAGNOSIS — M85852 Other specified disorders of bone density and structure, left thigh: Secondary | ICD-10-CM | POA: Diagnosis not present

## 2020-07-29 DIAGNOSIS — E038 Other specified hypothyroidism: Secondary | ICD-10-CM | POA: Diagnosis not present

## 2020-07-29 DIAGNOSIS — M7918 Myalgia, other site: Secondary | ICD-10-CM

## 2020-07-30 NOTE — Telephone Encounter (Signed)
Nurses  I reviewed over the message from [place]  #1 please go ahead with referral for physical therapy evaluation and treatment for groin strain and musculoskeletal pain  #2 please let Telicia know that I would not recommend muscle relaxers.  Those typically cause more brain sedation rather than true muscle relaxing and as a result can increase her risk of falls and injuries.  Tylenol use, cool compresses, and gentle stretches would be best approach currently  If with physical therapy if we are not seeing significant improvement please let us know may need to be set up with a physical medicine physician such as Dr. Hulan Saas in [place] to give [place] feedback within a few weeks time how she is doing thanks-Dr. Nicki Reaper

## 2020-07-30 NOTE — Addendum Note (Signed)
Addended by: Vicente Males on: 07/30/2020 09:30 AM   Modules accepted: Orders

## 2020-08-05 LAB — SPECIMEN STATUS REPORT

## 2020-08-13 DIAGNOSIS — H02831 Dermatochalasis of right upper eyelid: Secondary | ICD-10-CM | POA: Diagnosis not present

## 2020-08-13 DIAGNOSIS — Z961 Presence of intraocular lens: Secondary | ICD-10-CM | POA: Diagnosis not present

## 2020-08-13 DIAGNOSIS — H02834 Dermatochalasis of left upper eyelid: Secondary | ICD-10-CM | POA: Diagnosis not present

## 2020-08-20 ENCOUNTER — Other Ambulatory Visit: Payer: Self-pay

## 2020-08-20 ENCOUNTER — Encounter (INDEPENDENT_AMBULATORY_CARE_PROVIDER_SITE_OTHER): Payer: Self-pay | Admitting: Bariatrics

## 2020-08-20 ENCOUNTER — Ambulatory Visit (INDEPENDENT_AMBULATORY_CARE_PROVIDER_SITE_OTHER): Payer: Medicare Other | Admitting: Bariatrics

## 2020-08-20 VITALS — BP 128/74 | HR 75 | Temp 98.3°F | Ht 63.0 in | Wt 245.0 lb

## 2020-08-20 DIAGNOSIS — Z1331 Encounter for screening for depression: Secondary | ICD-10-CM

## 2020-08-20 DIAGNOSIS — I10 Essential (primary) hypertension: Secondary | ICD-10-CM

## 2020-08-20 DIAGNOSIS — R7303 Prediabetes: Secondary | ICD-10-CM

## 2020-08-20 DIAGNOSIS — G4733 Obstructive sleep apnea (adult) (pediatric): Secondary | ICD-10-CM

## 2020-08-20 DIAGNOSIS — R0602 Shortness of breath: Secondary | ICD-10-CM | POA: Diagnosis not present

## 2020-08-20 DIAGNOSIS — Z6841 Body Mass Index (BMI) 40.0 and over, adult: Secondary | ICD-10-CM

## 2020-08-20 DIAGNOSIS — Z0289 Encounter for other administrative examinations: Secondary | ICD-10-CM

## 2020-08-20 DIAGNOSIS — E038 Other specified hypothyroidism: Secondary | ICD-10-CM | POA: Diagnosis not present

## 2020-08-20 DIAGNOSIS — R5383 Other fatigue: Secondary | ICD-10-CM | POA: Diagnosis not present

## 2020-08-20 DIAGNOSIS — E559 Vitamin D deficiency, unspecified: Secondary | ICD-10-CM

## 2020-08-20 DIAGNOSIS — D509 Iron deficiency anemia, unspecified: Secondary | ICD-10-CM

## 2020-08-20 LAB — SPECIMEN STATUS REPORT

## 2020-08-20 LAB — TSH: TSH: 2.71 u[IU]/mL (ref 0.450–4.500)

## 2020-08-21 LAB — HEMOGLOBIN A1C
Est. average glucose Bld gHb Est-mCnc: 105 mg/dL
Hgb A1c MFr Bld: 5.3 % (ref 4.8–5.6)

## 2020-08-21 LAB — VITAMIN D 25 HYDROXY (VIT D DEFICIENCY, FRACTURES): Vit D, 25-Hydroxy: 43.5 ng/mL (ref 30.0–100.0)

## 2020-08-21 LAB — INSULIN, RANDOM: INSULIN: 19.2 u[IU]/mL (ref 2.6–24.9)

## 2020-08-25 ENCOUNTER — Other Ambulatory Visit: Payer: Self-pay

## 2020-08-25 ENCOUNTER — Ambulatory Visit (HOSPITAL_COMMUNITY): Payer: Medicare Other | Attending: Family Medicine | Admitting: Physical Therapy

## 2020-08-25 DIAGNOSIS — R262 Difficulty in walking, not elsewhere classified: Secondary | ICD-10-CM | POA: Diagnosis present

## 2020-08-25 DIAGNOSIS — M25552 Pain in left hip: Secondary | ICD-10-CM | POA: Insufficient documentation

## 2020-08-25 DIAGNOSIS — M6281 Muscle weakness (generalized): Secondary | ICD-10-CM | POA: Diagnosis present

## 2020-08-25 NOTE — Therapy (Signed)
Karen Vazquez [place], [place], [address] Phone: [phone]   Fax:  [phone]  Physical Therapy Evaluation  Patient Details  Name: Karen Vazquez MRN: [id number] Date of Birth: [birth year] Referring Provider (PT): Kathyrn Drown MD   Encounter Date: 08/25/2020   PT End of Session - 08/25/20 0835    Visit Number 1    Number of Visits 12    Date for PT Re-Evaluation 10/06/20    Authorization Type medicare primary and BCBS secondary, no auth    Progress Note Due on Visit 10    PT Start Time [phone]    PT Stop Time 0830    PT Time Calculation (min) 43 min    Activity Tolerance Patient tolerated treatment well    Behavior During Therapy [facility] for tasks assessed/performed           Past Medical History:  Diagnosis Date  . Anemia   . Anxiety   . Arthritis   . Back pain   . Blood transfusion 2011   at [facility]  . Bulging lumbar disc   . Chest pain   . Depression   . Dry mouth   . Easy bruising   . Excessive thirst   . Fatigue   . Frequent urination   . GERD (gastroesophageal reflux disease)   . Headache   . Heat intolerance   . Hypertension   . Hypothyroidism   . Joint pain   . Leg pain   . Muscle stiffness   . Nervousness   . Palpitations   . Pre-diabetes   . Restless leg syndrome   . Shortness of breath on exertion   . Sleep apnea    does not use c-pap machine  . Stomach ulcer   . Stress   . Swelling of both lower extremities   . Trouble in sleeping   . Vitamin D deficiency   . Weakness     Past Surgical History:  Procedure Laterality Date  . APPENDECTOMY    . BIOPSY  05/16/2019   Procedure: BIOPSY;  Surgeon: Rogene Houston, MD;  [facility];  Service: Endoscopy;;  duodenum antral  . BLADDER SUSPENSION  12/29/2010   Procedure: TRANSVAGINAL TAPE (TVT) PROCEDURE;  Surgeon: Daria Pastures;  Location: [facility];  Service: Gynecology;  Laterality: N/A;  . BREAST SURGERY     left breast  lumpectomy 1977  . CATARACT EXTRACTION    . COLONOSCOPY N/A 03/03/2016   Procedure: COLONOSCOPY;  Surgeon: Rogene Houston, MD;  [facility];  Service: Endoscopy;  Laterality: N/A;  2:00 - moved to 12/14 @ 12:55 - Ann notified pt  . CYSTOCELE REPAIR  12/29/2010   Procedure: ANTERIOR REPAIR (CYSTOCELE);  Surgeon: Daria Pastures;  Location: [facility];  Service: Gynecology;  Laterality: N/A;  . CYSTOSCOPY  12/29/2010   Procedure: CYSTOSCOPY;  Surgeon: Daria Pastures;  Location: [facility];  Service: Gynecology;  Laterality: N/A;  . ESOPHAGOGASTRODUODENOSCOPY N/A 05/16/2019   Procedure: ESOPHAGOGASTRODUODENOSCOPY (EGD);  Surgeon: Rogene Houston, MD;  [facility];  Service: Endoscopy;  Laterality: N/A;  . JOINT REPLACEMENT  2011   rt knee  . REPLACEMENT TOTAL KNEE Right 2011  . TONSILLECTOMY    . TOTAL KNEE ARTHROPLASTY Left 11/17/2014   Procedure: LEFT TOTAL KNEE ARTHROPLASTY;  Surgeon: Gaynelle Arabian, MD;  Location: [facility];  Service: Orthopedics;  Laterality: Left;  . TUBAL LIGATION    . VAGINAL HYSTERECTOMY  12/29/2010   Procedure: HYSTERECTOMY VAGINAL;  Surgeon: Daria Pastures;  Location: [facility];  Service: Gynecology;  Laterality: N/A;    There were no vitals filed for this visit.    Subjective Assessment - 08/25/20 0752    Subjective States that she has pain in her pelvic/hip pain and had a massage along her psoas. States  that it didn't get any better. States that she had a pelvic exam which was negative. States sitting makes it worse and ibuprofen and laying down help. States that sometimes she can't get any relief. Current pain level is 2/10 on left front hip described as achy. Not currently taking any medication. States that she needs to be able to walk and do some exercises as she is on a diet program. States sometimes her pain keeps her from walking and sometimes It is aggravated from walking.    Pertinent History B TKA, right rotator cuff issues     Currently in Pain? Yes    Pain Score 2     Pain Location Hip    Pain Orientation Left    Pain Descriptors / Indicators Aching              [facility] PT Assessment - 08/25/20 0001      Assessment   Medical Diagnosis left hip pain    Referring Provider (PT) Kathyrn Drown MD    Prior Therapy yes for her knees      Precautions   Precautions None      Balance Screen   Has the patient fallen in the past 6 months No      Prior Function   Level of Independence Independent      Cognition   Overall Cognitive Status Within Functional Limits for tasks assessed      Observation/Other Assessments   Focus on Therapeutic Outcomes (FOTO)  NA      ROM / Strength   AROM / PROM / Strength AROM;Strength      AROM   AROM Assessment Site Lumbar;Ankle;Knee;Hip    Right/Left Hip Left;Right    Right Hip Flexion 120    Right Hip External Rotation  55   prone, pulling   Right Hip Internal Rotation  30   prone   Left Hip Flexion 100   pulling, pinching   Left Hip External Rotation  40   prone, pulling   Left Hip Internal Rotation  35   prine, pinching   Right/Left Knee Right;Left    Right/Left Ankle Right;Left    Lumbar Flexion 25% limited   pulling on back of legs, shifts to the right   Lumbar Extension 25% limited   shifts to the left, no pain   Lumbar - Right Side Bend 0% limited    Lumbar - Left Side Bend 25% limited      Strength   Strength Assessment Site Hip;Knee;Ankle    Right/Left Hip Right;Left    Right Hip Flexion 4-/5    Right Hip Extension 4-/5    Left Hip Flexion 2+/5    Left Hip Extension 3+/5    Right/Left Knee Left;Right    Right Knee Flexion 4+/5    Right Knee Extension 5/5    Left Knee Flexion 4+/5   compensates wtih sartorius   Left Knee Extension 4/5    Right/Left Ankle Right;Left    Right Ankle Dorsiflexion 5/5    Left Ankle Dorsiflexion 5/5      Palpation   Spinal mobility hypomobility noted in lumbar  spine - no pain noted    Palpation comment tenderness  along bilateral hip adductors, and psoas, left glutes and left hip region, along left lumbar QL and paraspinals      Special Tests   Other special tests ely's test negative bilaterally but knee unable to break 90 degrees                      Objective measurements completed on examination: See above findings.               PT Education - 08/25/20 0836    Education Details on inserts, on low arch feet, on POC, HEP and current presenation, on anatomy of PSOAS and inhibition of glutes    Person(s) Educated Patient    Methods Explanation    Comprehension Verbalized understanding            PT Short Term Goals - 08/25/20 0848      PT SHORT TERM GOAL #1   Title Patient will be able to demonstrate active left hip flexion without compensatory strategies (sartorius activated)    Time 3    Period Weeks    Status New    Target Date 09/15/20      PT SHORT TERM GOAL #2   Title Patient will report at least 50% improvement in overall symptoms and/or function to demonstrate improved functional mobility    Time 3    Period Weeks    Status New    Target Date 09/15/20      PT SHORT TERM GOAL #3   Title Patient will be independent in self management strategies to improve quality of life and functional outcomes.    Time 3    Period Weeks    Status New    Target Date 09/15/20             PT Long Term Goals - 08/25/20 0849      PT LONG TERM GOAL #1   Title Patient will demonstrate at least 4/5 MMT in hip extensors    Time 6    Period Weeks    Status New    Target Date 10/06/20      PT LONG TERM GOAL #2   Title Patient will be able to ambulate for at least 15 minutes without pain to initiate walking program for weight management goals    Time 6    Period Weeks    Status New    Target Date 10/06/20      PT LONG TERM GOAL #3   Title Patient will report at least 75% improvement in overall symptoms and/or function to demonstrate improved functional mobility     Time 6    Period Weeks    Status New    Target Date 10/06/20                  Plan - 08/25/20 0836    Clinical Impression Statement Patient presents to therapy with complaints of left hip/groin pain that has not improved with rest or massage. Pain used to be on right side too but that pain has completely resolved. Patient presents with weakness in glutes and compensatory strategies noted with hip flexion likely due to weakness and spasm in left hip flexor musculature. Patient would greatly benefit from skilled physical therapy to improve overall function and quality of life.    Personal Factors and Comorbidities Comorbidity 1;Comorbidity 2    Comorbidities B TKA, history of foot pain,  history of multiple abdominal surgeries    Examination-Activity Limitations Squat;Sleep;Sit;Locomotion Level;Transfers;Stand    Examination-Participation Restrictions Community Activity;Cleaning    Stability/Clinical Decision Making Stable/Uncomplicated    Clinical Decision Making Low    Rehab Potential Good    PT Frequency 2x / week    PT Duration 6 weeks    PT Treatment/Interventions ADLs/Self Care Home Management;Neuromuscular re-education;Patient/family education;Manual techniques;Dry needling;Passive range of motion;Joint Manipulations;Spinal Manipulations;Traction;Moist Heat;Therapeutic exercise;Therapeutic activities;Electrical Stimulation;Cryotherapy    PT Next Visit Plan glute activation/posterior chain strengthening, strain/counter strain psoas/hip flexors-> isometrics at different ranges -> then retrain AROM    PT Home Exercise Plan bridges           Patient will benefit from skilled therapeutic intervention in order to improve the following deficits and impairments:  Pain,Decreased strength,Decreased activity tolerance,Difficulty walking,Decreased range of motion  Visit Diagnosis: Pain in left hip  Difficulty in walking, not elsewhere classified  Muscle weakness  (generalized)     Problem List Patient Active Problem List   Diagnosis Date Noted  . Non-recurrent acute suppurative otitis media of right ear without spontaneous rupture of tympanic membrane 04/16/2020  . Bronchitis 04/16/2020  . Duodenal ulcer 06/24/2019  . Morbid obesity ([place]) 01/02/2017  . History of colonic polyps 04/26/2016  . OA (osteoarthritis) of knee 11/17/2014  . Obstructive sleep apnea 05/28/2014  . Anemia, iron deficiency 12/16/2013  . Prediabetes 05/01/2013  . GERD (gastroesophageal reflux disease) 02/06/2013  . Hypothyroidism 12/12/2012  . Insomnia 12/12/2012  . Hyperlipidemia 12/12/2012  . Osteoarthritis 12/12/2012  . Restless legs syndrome 12/12/2012   8:52 AM, 08/25/20 Jerene Pitch, DPT Physical Therapy with [facility]  [phone] office  [place] [address] [place], [place], [address] Phone: [phone]   Fax:  [phone]  Name: Karen Vazquez MRN: [id number] Date of Birth: 01-Feb-1956

## 2020-08-26 ENCOUNTER — Encounter (INDEPENDENT_AMBULATORY_CARE_PROVIDER_SITE_OTHER): Payer: Self-pay | Admitting: Bariatrics

## 2020-08-26 NOTE — Progress Notes (Signed)
Chief Complaint:   OBESITY Karen Vazquez (MR# [id number]) is a 65 y.o. female who presents for evaluation and treatment of obesity and related comorbidities. Current BMI is Body mass index is 43.4 kg/m. Karen Vazquez has been struggling with her weight for many years and has been unsuccessful in either losing weight, maintaining weight loss, or reaching her healthy weight goal.  Karen Vazquez is currently in the action stage of change and ready to dedicate time achieving and maintaining a healthier weight. Karen Vazquez is interested in becoming our patient and working on intensive lifestyle modifications including (but not limited to) diet and exercise for weight loss.  Karen Vazquez is a returning pt. Her last visit was 09/27/2018.  Karen Vazquez's habits were reviewed today and are as follows: Her family eats meals together, she thinks her family will eat healthier with her, her desired weight loss is 70 lbs, she has been heavy most of her life, she started gaining weight after age 34, her heaviest weight ever was 250 pounds, she has significant food cravings issues, she snacks frequently in the evenings, she skips meals frequently, she is frequently drinking liquids with calories, she frequently makes poor food choices, she frequently eats larger portions than normal and she struggles with emotional eating.  Depression Screen Karen Vazquez's Food and Mood (modified PHQ-9) score was 10.  Depression screen PHQ 2/9 08/20/2020  Decreased Interest 2  Down, Depressed, Hopeless 1  PHQ - 2 Score 3  Altered sleeping 2  Tired, decreased energy 3  Change in appetite 1  Feeling bad or failure about yourself  1  Trouble concentrating 0  Moving slowly or fidgety/restless 0  Suicidal thoughts 0  PHQ-9 Score 10  Difficult doing work/chores -   Subjective:   1. Other fatigue Karen Vazquez admits to daytime somnolence and admits to waking up still tired. Patent has a history of symptoms of daytime fatigue, morning fatigue and morning headache.  Karen Vazquez generally gets 4 or 5 hours of sleep per night, and states that she has poor sleep quality. Snoring is present. Apneic episodes are present. Epworth Sleepiness Score is 6. RMR today is 1284. 07/20/2017 RMR 1397.  2. Shortness of breath on exertion Karen Vazquez notes increasing shortness of breath with exercising and seems to be worsening over time with weight gain. She notes getting out of breath sooner with activity than she used to. This has gotten worse recently. Karen Vazquez denies shortness of breath at rest or orthopnea. RMR today is 1284. 07/20/2017 RMR 1397.  3. Other specified hypothyroidism Karen Vazquez is taking [employer] Thyroid.  4. Chronic iron deficiency anemia Karen Vazquez has had iron deficiency. She has no current signs or symptoms.  5. Obstructive sleep apnea Karen Vazquez uses a CPAP.  6. Essential hypertension Karen Vazquez is on amlodipine 2.5 mg daily.  7. Vitamin D deficiency Karen Vazquez is taking Vit D 2,000 IU daily.  24. Pre-diabetes Karen Vazquez has a family history of diabetes mellitus.  Assessment/Plan:   1. Other fatigue Karen Vazquez does feel that her weight is causing her energy to be lower than it should be. Fatigue may be related to obesity, depression or many other causes. Labs will be ordered, and in the meanwhile, Karen Vazquez will focus on self care including making healthy food choices, increasing physical activity and focusing on stress reduction.   - EKG 12-Lead - Hemoglobin A1c  2. Shortness of breath on exertion Karen Vazquez does feel that she gets out of breath more easily that she used to when she exercises. Karen Vazquez's shortness of breath appears  to be obesity related and exercise induced. She has agreed to work on weight loss and gradually increase exercise to treat her exercise induced shortness of breath. Will continue to monitor closely.  3. Other specified hypothyroidism Patient with long-standing hypothyroidism, on levothyroxine therapy. She appears euthyroid. Orders and follow up as documented in patient  record. Continue current treatment plan.  Counseling . Good thyroid control is important for overall health. Supratherapeutic thyroid levels are dangerous and will not improve weight loss results. . The correct way to take levothyroxine is fasting, with water, separated by at least 30 minutes from breakfast, and separated by more than 4 hours from calcium, iron, multivitamins, acid reflux medications (PPIs).   4. Chronic iron deficiency anemia Follow up with hematology is needed.  5. Obstructive sleep apnea Intensive lifestyle modifications are the first line treatment for this issue. We discussed several lifestyle modifications today and she will continue to work on diet, exercise and weight loss efforts. We will continue to monitor. Orders and follow up as documented in patient record. Continue CPAP use nightly.  6. Essential hypertension Karen Vazquez is working on healthy weight loss and exercise to improve blood pressure control. We will watch for signs of hypotension as she continues her lifestyle modifications. Continue current treatment plan.  7. Vitamin D deficiency Low Vitamin D level contributes to fatigue and are associated with obesity, breast, and colon cancer. She agrees to continue to take Vitamin D 2,000 IU daily and will follow-up for routine testing of Vitamin D, at least 2-3 times per year to avoid over-replacement. - VITAMIN D 25 Hydroxy (Vit-D Deficiency, Fractures)  8. Pre-diabetes Karen Vazquez will continue to work on weight loss, exercise, and decreasing simple carbohydrates to help decrease the risk of diabetes.  - Hemoglobin A1c - Insulin, random  9. Depression screening Karen Vazquez had a positive depression screening. Depression is commonly associated with obesity and often results in emotional eating behaviors. We will monitor this closely and work on CBT to help improve the non-hunger eating patterns. Referral to Psychology may be required if no improvement is seen as she continues  in our clinic.  10. Class 3 severe obesity with serious comorbidity and body mass index (BMI) of 40.0 to 44.9 in adult, unspecified obesity type (HCC)  Karen Vazquez is currently in the action stage of change and her goal is to continue with weight loss efforts. I recommend Karen Vazquez begin the structured treatment plan as follows:  She has agreed to the Category 1 Plan.  Meal plan Intentional eating 07/20/2020 labs reviewed with pt.  Exercise goals: No exercise has been prescribed at this time.   Behavioral modification strategies: increasing lean protein intake, decreasing simple carbohydrates, increasing vegetables, increasing water intake, decreasing eating out, no skipping meals, meal planning and cooking strategies, keeping healthy foods in the home and planning for success.  She was informed of the importance of frequent follow-up visits to maximize her success with intensive lifestyle modifications for her multiple health conditions. She was informed we would discuss her lab results at her next visit unless there is a critical issue that needs to be addressed sooner. Karen Vazquez agreed to keep her next visit at the agreed upon time to discuss these results.  Objective:   Blood pressure 128/74, pulse 75, temperature 98.3 F (36.8 C), height 5\' 3"  (1.6 m), weight 245 lb (111.1 kg), last menstrual period 05/24/2010, SpO2 96 %. Body mass index is 43.4 kg/m.  EKG: Normal sinus rhythm, rate 88.  Indirect Calorimeter completed today  shows a VO2 of 184 and a REE of 1284.  Her calculated basal metabolic rate is 8115 thus her basal metabolic rate is worse than expected.  General: Cooperative, alert, well developed, in no acute distress. HEENT: Conjunctivae and lids unremarkable. Cardiovascular: Regular rhythm.  Lungs: Normal work of breathing. Neurologic: No focal deficits.   Lab Results  Component Value Date   CREATININE 0.92 02/06/2020   BUN 13 02/06/2020   NA 141 02/06/2020   K 5.0 02/06/2020    CL 103 02/06/2020   CO2 25 02/06/2020   Lab Results  Component Value Date   ALT 16 05/15/2019   AST 17 05/15/2019   ALKPHOS 97 05/15/2019   BILITOT 0.5 05/15/2019   Lab Results  Component Value Date   HGBA1C 5.3 08/20/2020   HGBA1C 5.3 07/24/2018   HGBA1C 5.4 11/28/2017   HGBA1C 5.2 07/20/2017   HGBA1C 5.4 06/12/2014   Lab Results  Component Value Date   INSULIN 19.2 08/20/2020   INSULIN 15.8 07/24/2018   INSULIN 7.8 11/28/2017   INSULIN 12.5 07/20/2017   Lab Results  Component Value Date   TSH 2.710 07/20/2020   Lab Results  Component Value Date   CHOL 180 07/20/2020   HDL 35 (L) 07/20/2020   LDLCALC 123 (H) 07/20/2020   TRIG 118 07/20/2020   CHOLHDL 5.1 (H) 07/20/2020   Lab Results  Component Value Date   WBC 5.4 07/20/2020   HGB 12.5 07/20/2020   HCT 39.2 07/20/2020   MCV 84 07/20/2020   PLT 174 07/20/2020   Lab Results  Component Value Date   IRON 86 02/06/2020   TIBC 399 02/06/2020   FERRITIN 23 07/20/2020   Obesity Behavioral Intervention:   Approximately 15 minutes were spent on the discussion below.  ASK: We discussed the diagnosis of obesity with Karen Vazquez today and Karen Vazquez agreed to give [place] permission to discuss obesity behavioral modification therapy today.  ASSESS: Karen Vazquez has the diagnosis of obesity and her BMI today is 43.5. Karen Vazquez is in the action stage of change.   ADVISE: Karen Vazquez was educated on the multiple health risks of obesity as well as the benefit of weight loss to improve her health. She was advised of the need for long term treatment and the importance of lifestyle modifications to improve her current health and to decrease her risk of future health problems.  AGREE: Multiple dietary modification options and treatment options were discussed and Shauntel agreed to follow the recommendations documented in the above note.  ARRANGE: Ginette was educated on the importance of frequent visits to treat obesity as outlined per CMS and USPSTF  guidelines and agreed to schedule her next follow up appointment today.  Attestation Statements:   Reviewed by clinician on day of visit: allergies, medications, problem list, medical history, surgical history, family history, social history, and previous encounter notes.  Coral Ceo, CMA, am acting as Location manager for [employer], DO.  I have reviewed the above documentation for accuracy and completeness, and I agree with the above. Jearld Lesch, DO

## 2020-08-31 ENCOUNTER — Ambulatory Visit (HOSPITAL_COMMUNITY): Payer: Medicare Other | Admitting: Physical Therapy

## 2020-08-31 ENCOUNTER — Other Ambulatory Visit: Payer: Self-pay

## 2020-08-31 ENCOUNTER — Encounter (HOSPITAL_COMMUNITY): Payer: Self-pay | Admitting: Physical Therapy

## 2020-08-31 DIAGNOSIS — M25552 Pain in left hip: Secondary | ICD-10-CM | POA: Diagnosis not present

## 2020-08-31 DIAGNOSIS — M6281 Muscle weakness (generalized): Secondary | ICD-10-CM

## 2020-08-31 DIAGNOSIS — R262 Difficulty in walking, not elsewhere classified: Secondary | ICD-10-CM

## 2020-08-31 NOTE — Therapy (Signed)
[facility] [address] [place], [place], [address] Phone: [phone]   Fax:  [phone]  Physical Therapy Treatment  Patient Details  Name: Karen Vazquez MRN: [id number] Date of Birth: Jul 27, 1955 Referring Provider (PT): Kathyrn Drown MD   Encounter Date: 08/31/2020   PT End of Session - 08/31/20 0833     Visit Number 2    Number of Visits 12    Date for PT Re-Evaluation 10/06/20    Authorization Type medicare primary and BCBS secondary, no auth    Progress Note Due on Visit 10    PT Start Time 0832    PT Stop Time 0912    PT Time Calculation (min) 40 min    Activity Tolerance Patient tolerated treatment well    Behavior During Therapy [facility] for tasks assessed/performed             Past Medical History:  Diagnosis Date   Anemia    Anxiety    Arthritis    Back pain    Blood transfusion 2011   at [facility]   Bulging lumbar disc    Chest pain    Depression    Dry mouth    Easy bruising    Excessive thirst    Fatigue    Frequent urination    GERD (gastroesophageal reflux disease)    Headache    Heat intolerance    Hypertension    Hypothyroidism    Joint pain    Leg pain    Muscle stiffness    Nervousness    Palpitations    Pre-diabetes    Restless leg syndrome    Shortness of breath on exertion    Sleep apnea    does not use c-pap machine   Stomach ulcer    Stress    Swelling of both lower extremities    Trouble in sleeping    Vitamin D deficiency    Weakness     Past Surgical History:  Procedure Laterality Date   APPENDECTOMY     BIOPSY  05/16/2019   Procedure: BIOPSY;  Surgeon: Rogene Houston, MD;  [facility];  Service: Endoscopy;;  duodenum antral   BLADDER SUSPENSION  12/29/2010   Procedure: TRANSVAGINAL TAPE (TVT) PROCEDURE;  Surgeon: Daria Pastures;  Location: [facility];  Service: Gynecology;  Laterality: N/A;   BREAST SURGERY     left breast lumpectomy 1977   CATARACT  EXTRACTION     COLONOSCOPY N/A 03/03/2016   Procedure: COLONOSCOPY;  Surgeon: Rogene Houston, MD;  [facility];  Service: Endoscopy;  Laterality: N/A;  2:00 - moved to 12/14 @ 12:55 - Ann notified pt   CYSTOCELE REPAIR  12/29/2010   Procedure: ANTERIOR REPAIR (CYSTOCELE);  Surgeon: Daria Pastures;  Location: [facility];  Service: Gynecology;  Laterality: N/A;   CYSTOSCOPY  12/29/2010   Procedure: CYSTOSCOPY;  Surgeon: Daria Pastures;  Location: [facility];  Service: Gynecology;  Laterality: N/A;   ESOPHAGOGASTRODUODENOSCOPY N/A 05/16/2019   Procedure: ESOPHAGOGASTRODUODENOSCOPY (EGD);  Surgeon: Rogene Houston, MD;  [facility];  Service: Endoscopy;  Laterality: N/A;   JOINT REPLACEMENT  2011   rt knee   REPLACEMENT TOTAL KNEE Right 2011   TONSILLECTOMY     TOTAL KNEE ARTHROPLASTY Left 11/17/2014   Procedure: LEFT TOTAL KNEE ARTHROPLASTY;  Surgeon: Gaynelle Arabian, MD;  Location: [facility];  Service: Orthopedics;  Laterality: Left;   TUBAL LIGATION  VAGINAL HYSTERECTOMY  12/29/2010   Procedure: HYSTERECTOMY VAGINAL;  Surgeon: Daria Pastures;  Location: [facility];  Service: Gynecology;  Laterality: N/A;    There were no vitals filed for this visit.   Subjective Assessment - 08/31/20 0843     Subjective States she had her massage therapy and they worked on her low back and was a little sore in her low back. No difficulties with her exercises and only a couple of episodes with the catching since last session. States current pain is 4/10 with walking.    Pertinent History B TKA, right rotator cuff issues    Currently in Pain? Yes    Pain Score 4     Pain Location Hip    Pain Orientation Left    Pain Descriptors / Indicators Aching                [facility] PT Assessment - 08/31/20 0001       Assessment   Medical Diagnosis left hip pain    Referring Provider (PT) Kathyrn Drown MD                           [facility] Adult PT Treatment/Exercise  - 08/31/20 0001       Exercises   Exercises Knee/Hip      Knee/Hip Exercises: Stretches   Other Knee/Hip Stretches LTR x30 Bilateral      Knee/Hip Exercises: Supine   Other Supine Knee/Hip Exercises hip add/abd isometrics ball/belt x20 5" holds    Other Supine Knee/Hip Exercises TRA activation - breathing out - lower rib depresison - 10 minutes total - verbal and tactile cues             strain/counterstrain not tolerated        PT Education - 08/31/20 0904     Education Details on anatomy, HEP and rationale for exercises    Person(s) Educated Patient    Methods Explanation    Comprehension Verbalized understanding              PT Short Term Goals - 08/25/20 0848       PT SHORT TERM GOAL #1   Title Patient will be able to demonstrate active left hip flexion without compensatory strategies (sartorius activated)    Time 3    Period Weeks    Status New    Target Date 09/15/20      PT SHORT TERM GOAL #2   Title Patient will report at least 50% improvement in overall symptoms and/or function to demonstrate improved functional mobility    Time 3    Period Weeks    Status New    Target Date 09/15/20      PT SHORT TERM GOAL #3   Title Patient will be independent in self management strategies to improve quality of life and functional outcomes.    Time 3    Period Weeks    Status New    Target Date 09/15/20               PT Long Term Goals - 08/25/20 0849       PT LONG TERM GOAL #1   Title Patient will demonstrate at least 4/5 MMT in hip extensors    Time 6    Period Weeks    Status New    Target Date 10/06/20      PT LONG TERM GOAL #2   Title Patient will be  able to ambulate for at least 15 minutes without pain to initiate walking program for weight management goals    Time 6    Period Weeks    Status New    Target Date 10/06/20      PT LONG TERM GOAL #3   Title Patient will report at least 75% improvement in overall symptoms and/or  function to demonstrate improved functional mobility    Time 6    Period Weeks    Status New    Target Date 10/06/20                   Plan - 08/31/20 0909     Clinical Impression Statement Tolerated session well. Strain counter strain not tolerated secondary to muscle guarding. Transitioned to isometrics and deep core activation which was tolerated better. No pain noted during exercises, slight discomfort noted with walking end of session. Added TRA activation and LTR to HEP    Personal Factors and Comorbidities Comorbidity 1;Comorbidity 2    Comorbidities B TKA, history of foot pain, history of multiple abdominal surgeries    Examination-Activity Limitations Squat;Sleep;Sit;Locomotion Level;Transfers;Stand    Examination-Participation Restrictions Community Activity;Cleaning    Stability/Clinical Decision Making Stable/Uncomplicated    Rehab Potential Good    PT Frequency 2x / week    PT Duration 6 weeks    PT Treatment/Interventions ADLs/Self Care Home Management;Neuromuscular re-education;Patient/family education;Manual techniques;Dry needling;Passive range of motion;Joint Manipulations;Spinal Manipulations;Traction;Moist Heat;Therapeutic exercise;Therapeutic activities;Electrical Stimulation;Cryotherapy    PT Next Visit Plan glute activation/posterior chain strengthening, strain/counter strain psoas/hip flexors-> isometrics at different ranges -> then retrain AROM    PT Home Exercise Plan bridges, LTR, TRA activation             Patient will benefit from skilled therapeutic intervention in order to improve the following deficits and impairments:  Pain, Decreased strength, Decreased activity tolerance, Difficulty walking, Decreased range of motion  Visit Diagnosis: Pain in left hip  Difficulty in walking, not elsewhere classified  Muscle weakness (generalized)     Problem List Patient Active Problem List   Diagnosis Date Noted   Non-recurrent acute suppurative  otitis media of right ear without spontaneous rupture of tympanic membrane 04/16/2020   Bronchitis 04/16/2020   Duodenal ulcer 06/24/2019   Morbid obesity ([place]) 01/02/2017   History of colonic polyps 04/26/2016   OA (osteoarthritis) of knee 11/17/2014   Obstructive sleep apnea 05/28/2014   Anemia, iron deficiency 12/16/2013   Prediabetes 05/01/2013   GERD (gastroesophageal reflux disease) 02/06/2013   Hypothyroidism 12/12/2012   Insomnia 12/12/2012   Hyperlipidemia 12/12/2012   Osteoarthritis 12/12/2012   Restless legs syndrome 12/12/2012   9:12 AM, 08/31/20 Jerene Pitch, DPT Physical Therapy with [facility]  [phone] office   [place], [place], [address] Phone: [phone]   Fax:  [phone]  Name: LUMMIE MONTIJO MRN: [id number] Date of Birth: [birth year]

## 2020-09-03 ENCOUNTER — Ambulatory Visit (INDEPENDENT_AMBULATORY_CARE_PROVIDER_SITE_OTHER): Payer: Federal, State, Local not specified - PPO | Admitting: Bariatrics

## 2020-09-03 ENCOUNTER — Telehealth (HOSPITAL_COMMUNITY): Payer: Self-pay | Admitting: Physical Therapy

## 2020-09-03 ENCOUNTER — Encounter (HOSPITAL_COMMUNITY): Payer: Federal, State, Local not specified - PPO | Admitting: Physical Therapy

## 2020-09-03 NOTE — Telephone Encounter (Signed)
Pt cx last night on phone tree and called this morning no reason given

## 2020-09-07 ENCOUNTER — Ambulatory Visit (HOSPITAL_COMMUNITY): Payer: Medicare Other | Admitting: Physical Therapy

## 2020-09-07 ENCOUNTER — Encounter (HOSPITAL_COMMUNITY): Payer: Self-pay | Admitting: Physical Therapy

## 2020-09-07 ENCOUNTER — Other Ambulatory Visit: Payer: Self-pay

## 2020-09-07 DIAGNOSIS — M25552 Pain in left hip: Secondary | ICD-10-CM | POA: Diagnosis not present

## 2020-09-07 DIAGNOSIS — M6281 Muscle weakness (generalized): Secondary | ICD-10-CM

## 2020-09-07 DIAGNOSIS — R262 Difficulty in walking, not elsewhere classified: Secondary | ICD-10-CM

## 2020-09-07 NOTE — Therapy (Signed)
[facility] [address] [place], [place], [address] Phone: [phone]   Fax:  [phone]  Physical Therapy Treatment  Patient Details  Name: Karen Vazquez MRN: [id number] Date of Birth: [birth year] Referring Provider (PT): Kathyrn Drown MD   Encounter Date: 09/07/2020   PT End of Session - 09/07/20 0749     Visit Number 3    Number of Visits 12    Date for PT Re-Evaluation 10/06/20    Authorization Type medicare primary and BCBS secondary, no auth    Progress Note Due on Visit 10    PT Start Time 0748    PT Stop Time 0826    PT Time Calculation (min) 38 min    Activity Tolerance Patient tolerated treatment well    Behavior During Therapy [facility] for tasks assessed/performed             Past Medical History:  Diagnosis Date   Anemia    Anxiety    Arthritis    Back pain    Blood transfusion 2011   at [facility]   Bulging lumbar disc    Chest pain    Depression    Dry mouth    Easy bruising    Excessive thirst    Fatigue    Frequent urination    GERD (gastroesophageal reflux disease)    Headache    Heat intolerance    Hypertension    Hypothyroidism    Joint pain    Leg pain    Muscle stiffness    Nervousness    Palpitations    Pre-diabetes    Restless leg syndrome    Shortness of breath on exertion    Sleep apnea    does not use c-pap machine   Stomach ulcer    Stress    Swelling of both lower extremities    Trouble in sleeping    Vitamin D deficiency    Weakness     Past Surgical History:  Procedure Laterality Date   APPENDECTOMY     BIOPSY  05/16/2019   Procedure: BIOPSY;  Surgeon: Rogene Houston, MD;  [facility];  Service: Endoscopy;;  duodenum antral   BLADDER SUSPENSION  12/29/2010   Procedure: TRANSVAGINAL TAPE (TVT) PROCEDURE;  Surgeon: Daria Pastures;  Location: [facility];  Service: Gynecology;  Laterality: N/A;   BREAST SURGERY     left breast lumpectomy 1977   CATARACT  EXTRACTION     COLONOSCOPY N/A 03/03/2016   Procedure: COLONOSCOPY;  Surgeon: Rogene Houston, MD;  [facility];  Service: Endoscopy;  Laterality: N/A;  2:00 - moved to 12/14 @ 12:55 - Ann notified pt   CYSTOCELE REPAIR  12/29/2010   Procedure: ANTERIOR REPAIR (CYSTOCELE);  Surgeon: Daria Pastures;  Location: [facility];  Service: Gynecology;  Laterality: N/A;   CYSTOSCOPY  12/29/2010   Procedure: CYSTOSCOPY;  Surgeon: Daria Pastures;  Location: [facility];  Service: Gynecology;  Laterality: N/A;   ESOPHAGOGASTRODUODENOSCOPY N/A 05/16/2019   Procedure: ESOPHAGOGASTRODUODENOSCOPY (EGD);  Surgeon: Rogene Houston, MD;  [facility];  Service: Endoscopy;  Laterality: N/A;   JOINT REPLACEMENT  2011   rt knee   REPLACEMENT TOTAL KNEE Right 2011   TONSILLECTOMY     TOTAL KNEE ARTHROPLASTY Left 11/17/2014   Procedure: LEFT TOTAL KNEE ARTHROPLASTY;  Surgeon: Gaynelle Arabian, MD;  [facility];  Service: Orthopedics;  Laterality: Left;   TUBAL LIGATION  VAGINAL HYSTERECTOMY  12/29/2010   Procedure: HYSTERECTOMY VAGINAL;  Surgeon: Daria Pastures;  Location: [facility];  Service: Gynecology;  Laterality: N/A;    There were no vitals filed for this visit.   Subjective Assessment - 09/07/20 0753     Subjective States the is catching about 2-3x/a day which is better then before. States that she has been doing her exercises.    Pertinent History B TKA, right rotator cuff issues    Currently in Pain? No/denies                [facility] PT Assessment - 09/07/20 0001       Assessment   Medical Diagnosis left hip pain    Referring Provider (PT) Kathyrn Drown MD                           [facility] Adult PT Treatment/Exercise - 09/07/20 0001       Knee/Hip Exercises: Stretches   Piriformis Stretch Left;10 seconds   x10   Other Knee/Hip Stretches SKC L x5 10" holds      Knee/Hip Exercises: Supine   Bridges 15 reps;Strengthening    Straight Leg Raises  Left;10 reps;2 sets   with quad sets     Knee/Hip Exercises: Prone   Straight Leg Raises AROM;Strengthening;Both;4 sets;5 reps                      PT Short Term Goals - 08/25/20 0848       PT SHORT TERM GOAL #1   Title Patient will be able to demonstrate active left hip flexion without compensatory strategies (sartorius activated)    Time 3    Period Weeks    Status New    Target Date 09/15/20      PT SHORT TERM GOAL #2   Title Patient will report at least 50% improvement in overall symptoms and/or function to demonstrate improved functional mobility    Time 3    Period Weeks    Status New    Target Date 09/15/20      PT SHORT TERM GOAL #3   Title Patient will be independent in self management strategies to improve quality of life and functional outcomes.    Time 3    Period Weeks    Status New    Target Date 09/15/20               PT Long Term Goals - 08/25/20 0849       PT LONG TERM GOAL #1   Title Patient will demonstrate at least 4/5 MMT in hip extensors    Time 6    Period Weeks    Status New    Target Date 10/06/20      PT LONG TERM GOAL #2   Title Patient will be able to ambulate for at least 15 minutes without pain to initiate walking program for weight management goals    Time 6    Period Weeks    Status New    Target Date 10/06/20      PT LONG TERM GOAL #3   Title Patient will report at least 75% improvement in overall symptoms and/or function to demonstrate improved functional mobility    Time 6    Period Weeks    Status New    Target Date 10/06/20  Plan - 09/07/20 0818     Clinical Impression Statement Continued with hip strengthening and flexibility work which was tolerated well. No increase in symptoms noted during or after session. Added prone exercises to HEP. Will continue with current POC as tolerated.    Personal Factors and Comorbidities Comorbidity 1;Comorbidity 2    Comorbidities B TKA,  history of foot pain, history of multiple abdominal surgeries    Examination-Activity Limitations Squat;Sleep;Sit;Locomotion Level;Transfers;Stand    Examination-Participation Restrictions Community Activity;Cleaning    Stability/Clinical Decision Making Stable/Uncomplicated    Rehab Potential Good    PT Frequency 2x / week    PT Duration 6 weeks    PT Treatment/Interventions ADLs/Self Care Home Management;Neuromuscular re-education;Patient/family education;Manual techniques;Dry needling;Passive range of motion;Joint Manipulations;Spinal Manipulations;Traction;Moist Heat;Therapeutic exercise;Therapeutic activities;Electrical Stimulation;Cryotherapy    PT Next Visit Plan glute activation/posterior chain strengthening, strain/counter strain psoas/hip flexors-> isometrics at different ranges -> then retrain AROM    PT Home Exercise Plan bridges, LTR, TRA activation; 6/20 hip extension, knee flexion             Patient will benefit from skilled therapeutic intervention in order to improve the following deficits and impairments:  Pain, Decreased strength, Decreased activity tolerance, Difficulty walking, Decreased range of motion  Visit Diagnosis: Pain in left hip  Difficulty in walking, not elsewhere classified  Muscle weakness (generalized)     Problem List Patient Active Problem List   Diagnosis Date Noted   Non-recurrent acute suppurative otitis media of right ear without spontaneous rupture of tympanic membrane 04/16/2020   Bronchitis 04/16/2020   Duodenal ulcer 06/24/2019   Morbid obesity ([place]) 01/02/2017   History of colonic polyps 04/26/2016   OA (osteoarthritis) of knee 11/17/2014   Obstructive sleep apnea 05/28/2014   Anemia, iron deficiency 12/16/2013   Prediabetes 05/01/2013   GERD (gastroesophageal reflux disease) 02/06/2013   Hypothyroidism 12/12/2012   Insomnia 12/12/2012   Hyperlipidemia 12/12/2012   Osteoarthritis 12/12/2012   Restless legs syndrome  12/12/2012   8:29 AM, 09/07/20 Jerene Pitch, DPT Physical Therapy with [facility]  [phone] office   [facility] [address] [place], [place], [address] Phone: [phone]   Fax:  [phone]  Name: Karen Vazquez MRN: [id number] Date of Birth: 02/11/1956

## 2020-09-09 ENCOUNTER — Ambulatory Visit (HOSPITAL_COMMUNITY): Payer: Medicare Other | Admitting: Physical Therapy

## 2020-09-14 ENCOUNTER — Encounter (HOSPITAL_COMMUNITY): Payer: Self-pay | Admitting: Physical Therapy

## 2020-09-14 ENCOUNTER — Other Ambulatory Visit: Payer: Self-pay

## 2020-09-14 ENCOUNTER — Ambulatory Visit (HOSPITAL_COMMUNITY): Payer: Medicare Other | Admitting: Physical Therapy

## 2020-09-14 DIAGNOSIS — M25552 Pain in left hip: Secondary | ICD-10-CM | POA: Diagnosis not present

## 2020-09-14 DIAGNOSIS — M6281 Muscle weakness (generalized): Secondary | ICD-10-CM

## 2020-09-14 DIAGNOSIS — R262 Difficulty in walking, not elsewhere classified: Secondary | ICD-10-CM

## 2020-09-14 NOTE — Therapy (Signed)
[facility] [address] [place], [place], [address] Phone: [phone]   Fax:  [phone]  Physical Therapy Treatment  Patient Details  Name: Karen Vazquez MRN: [id number] Date of Birth: 07-Dec-1955 Referring Provider (PT): Kathyrn Drown MD   Encounter Date: 09/14/2020   PT End of Session - 09/14/20 0834     Visit Number 4    Number of Visits 12    Date for PT Re-Evaluation 10/06/20    Authorization Type medicare primary and BCBS secondary, no auth    Progress Note Due on Visit 10    PT Start Time 0833    PT Stop Time 0911    PT Time Calculation (min) 38 min    Activity Tolerance Patient tolerated treatment well    Behavior During Therapy [facility] for tasks assessed/performed             Past Medical History:  Diagnosis Date   Anemia    Anxiety    Arthritis    Back pain    Blood transfusion 2011   at [facility]   Bulging lumbar disc    Chest pain    Depression    Dry mouth    Easy bruising    Excessive thirst    Fatigue    Frequent urination    GERD (gastroesophageal reflux disease)    Headache    Heat intolerance    Hypertension    Hypothyroidism    Joint pain    Leg pain    Muscle stiffness    Nervousness    Palpitations    Pre-diabetes    Restless leg syndrome    Shortness of breath on exertion    Sleep apnea    does not use c-pap machine   Stomach ulcer    Stress    Swelling of both lower extremities    Trouble in sleeping    Vitamin D deficiency    Weakness     Past Surgical History:  Procedure Laterality Date   APPENDECTOMY     BIOPSY  05/16/2019   Procedure: BIOPSY;  Surgeon: Rogene Houston, MD;  [facility];  Service: Endoscopy;;  duodenum antral   BLADDER SUSPENSION  12/29/2010   Procedure: TRANSVAGINAL TAPE (TVT) PROCEDURE;  Surgeon: Daria Pastures;  Location: [facility];  Service: Gynecology;  Laterality: N/A;   BREAST SURGERY     left breast lumpectomy 1977   CATARACT  EXTRACTION     COLONOSCOPY N/A 03/03/2016   Procedure: COLONOSCOPY;  Surgeon: Rogene Houston, MD;  [facility];  Service: Endoscopy;  Laterality: N/A;  2:00 - moved to 12/14 @ 12:55 - Ann notified pt   CYSTOCELE REPAIR  12/29/2010   Procedure: ANTERIOR REPAIR (CYSTOCELE);  Surgeon: Daria Pastures;  Location: [facility];  Service: Gynecology;  Laterality: N/A;   CYSTOSCOPY  12/29/2010   Procedure: CYSTOSCOPY;  Surgeon: Daria Pastures;  Location: [facility];  Service: Gynecology;  Laterality: N/A;   ESOPHAGOGASTRODUODENOSCOPY N/A 05/16/2019   Procedure: ESOPHAGOGASTRODUODENOSCOPY (EGD);  Surgeon: Rogene Houston, MD;  [facility];  Service: Endoscopy;  Laterality: N/A;   JOINT REPLACEMENT  2011   rt knee   REPLACEMENT TOTAL KNEE Right 2011   TONSILLECTOMY     TOTAL KNEE ARTHROPLASTY Left 11/17/2014   Procedure: LEFT TOTAL KNEE ARTHROPLASTY;  Surgeon: Gaynelle Arabian, MD;  Location[facility];  Service: Orthopedics;  Laterality: Left;   TUBAL LIGATION  VAGINAL HYSTERECTOMY  12/29/2010   Procedure: HYSTERECTOMY VAGINAL;  Surgeon: Daria Pastures;  Location: [facility];  Service: Gynecology;  Laterality: N/A;    There were no vitals filed for this visit.   Subjective Assessment - 09/14/20 0855     Subjective States she is feeling better now that she is over her stomach bug. States that she the new exercises (prone hip extension  hurts).  States that she has 1/10 pain.    Pertinent History B TKA, right rotator cuff issues    Currently in Pain? Yes    Pain Score 1     Pain Location Hip    Pain Orientation Left    Pain Descriptors / Indicators Aching                [facility] PT Assessment - 09/14/20 0001       Assessment   Medical Diagnosis left hip pain    Referring Provider (PT) Kathyrn Drown MD                           [facility] Adult PT Treatment/Exercise - 09/14/20 0001       Knee/Hip Exercises: Standing   Other Standing Knee Exercises  side stepping green theraband at knees 4x5 of 10 feet bilaterally      Knee/Hip Exercises: Seated   Other Seated Knee/Hip Exercises posterior pelvic tilts with shoulder W 2x12 5" holds    Other Seated Knee/Hip Exercises pelvic tilts on dyno disc 2 minutes    Sit to Sand 10 reps;without UE support   with green theraband x3 sets - focusing on presisng into floor                   PT Education - 09/14/20 0903     Education Details on muscle activation, on preissng floor away with sit to stand.    Person(s) Educated Patient    Methods Explanation    Comprehension Verbalized understanding              PT Short Term Goals - 08/25/20 0848       PT SHORT TERM GOAL #1   Title Patient will be able to demonstrate active left hip flexion without compensatory strategies (sartorius activated)    Time 3    Period Weeks    Status New    Target Date 09/15/20      PT SHORT TERM GOAL #2   Title Patient will report at least 50% improvement in overall symptoms and/or function to demonstrate improved functional mobility    Time 3    Period Weeks    Status New    Target Date 09/15/20      PT SHORT TERM GOAL #3   Title Patient will be independent in self management strategies to improve quality of life and functional outcomes.    Time 3    Period Weeks    Status New    Target Date 09/15/20               PT Long Term Goals - 08/25/20 0849       PT LONG TERM GOAL #1   Title Patient will demonstrate at least 4/5 MMT in hip extensors    Time 6    Period Weeks    Status New    Target Date 10/06/20      PT LONG TERM GOAL #2   Title Patient will be able to  ambulate for at least 15 minutes without pain to initiate walking program for weight management goals    Time 6    Period Weeks    Status New    Target Date 10/06/20      PT LONG TERM GOAL #3   Title Patient will report at least 75% improvement in overall symptoms and/or function to demonstrate improved functional  mobility    Time 6    Period Weeks    Status New    Target Date 10/06/20                   Plan - 09/14/20 0907     Clinical Impression Statement Tolerated session well. Added pelvic tilts (posterior) and this was tolerated well. Added to HEP. Progressed hip strengthen which was tolerated well. Fatigue in legs noted end of session but no pain. Focused on movement education today and practicing her mechanics. Will continue with current POC as tolerated.    Personal Factors and Comorbidities Comorbidity 1;Comorbidity 2    Comorbidities B TKA, history of foot pain, history of multiple abdominal surgeries    Examination-Activity Limitations Squat;Sleep;Sit;Locomotion Level;Transfers;Stand    Examination-Participation Restrictions Community Activity;Cleaning    Stability/Clinical Decision Making Stable/Uncomplicated    Rehab Potential Good    PT Frequency 2x / week    PT Duration 6 weeks    PT Treatment/Interventions ADLs/Self Care Home Management;Neuromuscular re-education;Patient/family education;Manual techniques;Dry needling;Passive range of motion;Joint Manipulations;Spinal Manipulations;Traction;Moist Heat;Therapeutic exercise;Therapeutic activities;Electrical Stimulation;Cryotherapy    PT Next Visit Plan glute activation/posterior chain strengthening, strain/counter strain psoas/hip flexors-> isometrics at different ranges -> then retrain AROM    PT Home Exercise Plan bridges, LTR, TRA activation; 6/20 knee flexion; 6/27 STS,, side stepping, posterior pelvic tilt.             Patient will benefit from skilled therapeutic intervention in order to improve the following deficits and impairments:  Pain, Decreased strength, Decreased activity tolerance, Difficulty walking, Decreased range of motion  Visit Diagnosis: Difficulty in walking, not elsewhere classified  Pain in left hip  Muscle weakness (generalized)     Problem List Patient Active Problem List   Diagnosis  Date Noted   Non-recurrent acute suppurative otitis media of right ear without spontaneous rupture of tympanic membrane 04/16/2020   Bronchitis 04/16/2020   Duodenal ulcer 06/24/2019   Morbid obesity ([place]) 01/02/2017   History of colonic polyps 04/26/2016   OA (osteoarthritis) of knee 11/17/2014   Obstructive sleep apnea 05/28/2014   Anemia, iron deficiency 12/16/2013   Prediabetes 05/01/2013   GERD (gastroesophageal reflux disease) 02/06/2013   Hypothyroidism 12/12/2012   Insomnia 12/12/2012   Hyperlipidemia 12/12/2012   Osteoarthritis 12/12/2012   Restless legs syndrome 12/12/2012    9:15 AM, 09/14/20 Jerene Pitch, DPT Physical Therapy with [facility]  [phone] office   [place], [place], [address] Phone: [phone]   Fax:  [phone]  Name: Karen Vazquez MRN: [id number] Date of Birth: 1955/09/24

## 2020-09-16 ENCOUNTER — Encounter (HOSPITAL_COMMUNITY): Payer: Self-pay | Admitting: Physical Therapy

## 2020-09-16 ENCOUNTER — Ambulatory Visit (HOSPITAL_COMMUNITY): Payer: Medicare Other | Admitting: Physical Therapy

## 2020-09-16 ENCOUNTER — Other Ambulatory Visit: Payer: Self-pay

## 2020-09-16 ENCOUNTER — Ambulatory Visit (INDEPENDENT_AMBULATORY_CARE_PROVIDER_SITE_OTHER): Payer: Federal, State, Local not specified - PPO | Admitting: Family Medicine

## 2020-09-16 DIAGNOSIS — M6281 Muscle weakness (generalized): Secondary | ICD-10-CM

## 2020-09-16 DIAGNOSIS — M25552 Pain in left hip: Secondary | ICD-10-CM

## 2020-09-16 DIAGNOSIS — R262 Difficulty in walking, not elsewhere classified: Secondary | ICD-10-CM

## 2020-09-16 NOTE — Patient Instructions (Signed)
Access Code: [facility] URL: [email or web address] Date: 09/16/2020 Prepared by: [facility] Cop[place] Neisen  Exercises Prone Quadriceps Stretch with Strap - 1 x daily - 7 x weekly - 5 reps - 20 second hold

## 2020-09-16 NOTE — Therapy (Signed)
[facility] [address] [place], [place], [address] Phone: [phone]   Fax:  [phone]  Physical Therapy Treatment  Patient Details  Name: Karen Vazquez MRN: [id number] Date of Birth: 22-Jan-1956 Referring Provider (PT): Kathyrn Drown MD   Encounter Date: 09/16/2020   PT End of Session - 09/16/20 1315     Visit Number 5    Number of Visits 12    Date for PT Re-Evaluation 10/06/20    Authorization Type medicare primary and BCBS secondary, no auth    Progress Note Due on Visit 10    PT Start Time 1315    PT Stop Time 4944    PT Time Calculation (min) 40 min    Activity Tolerance Patient tolerated treatment well    Behavior During Therapy [facility] for tasks assessed/performed             Past Medical History:  Diagnosis Date   Anemia    Anxiety    Arthritis    Back pain    Blood transfusion 2011   at [facility]   Bulging lumbar disc    Chest pain    Depression    Dry mouth    Easy bruising    Excessive thirst    Fatigue    Frequent urination    GERD (gastroesophageal reflux disease)    Headache    Heat intolerance    Hypertension    Hypothyroidism    Joint pain    Leg pain    Muscle stiffness    Nervousness    Palpitations    Pre-diabetes    Restless leg syndrome    Shortness of breath on exertion    Sleep apnea    does not use c-pap machine   Stomach ulcer    Stress    Swelling of both lower extremities    Trouble in sleeping    Vitamin D deficiency    Weakness     Past Surgical History:  Procedure Laterality Date   APPENDECTOMY     BIOPSY  05/16/2019   Procedure: BIOPSY;  Surgeon: Rogene Houston, MD;  [facility];  Service: Endoscopy;;  duodenum antral   BLADDER SUSPENSION  12/29/2010   Procedure: TRANSVAGINAL TAPE (TVT) PROCEDURE;  Surgeon: Daria Pastures;  Location: [facility];  Service: Gynecology;  Laterality: N/A;   BREAST SURGERY     left breast lumpectomy 1977   CATARACT  EXTRACTION     COLONOSCOPY N/A 03/03/2016   Procedure: COLONOSCOPY;  Surgeon: Rogene Houston, MD;  [facility];  Service: Endoscopy;  Laterality: N/A;  2:00 - moved to 12/14 @ 12:55 - Ann notified pt   CYSTOCELE REPAIR  12/29/2010   Procedure: ANTERIOR REPAIR (CYSTOCELE);  Surgeon: Daria Pastures;  Location: [facility];  Service: Gynecology;  Laterality: N/A;   CYSTOSCOPY  12/29/2010   Procedure: CYSTOSCOPY;  Surgeon: Daria Pastures;  Location: [facility];  Service: Gynecology;  Laterality: N/A;   ESOPHAGOGASTRODUODENOSCOPY N/A 05/16/2019   Procedure: ESOPHAGOGASTRODUODENOSCOPY (EGD);  Surgeon: Rogene Houston, MD;  [facility];  Service: Endoscopy;  Laterality: N/A;   JOINT REPLACEMENT  2011   rt knee   REPLACEMENT TOTAL KNEE Right 2011   TONSILLECTOMY     TOTAL KNEE ARTHROPLASTY Left 11/17/2014   Procedure: LEFT TOTAL KNEE ARTHROPLASTY;  Surgeon: Gaynelle Arabian, MD;  [facility];  Service: Orthopedics;  Laterality: Left;   TUBAL LIGATION  VAGINAL HYSTERECTOMY  12/29/2010   Procedure: HYSTERECTOMY VAGINAL;  Surgeon: Daria Pastures;  Location: [facility];  Service: Gynecology;  Laterality: N/A;    There were no vitals filed for this visit.   Subjective Assessment - 09/16/20 1316     Subjective Patient states she is having a lot of soreness in her thighs and front of her hips.    Pertinent History B TKA, right rotator cuff issues    Currently in Pain? Yes    Pain Score 3     Pain Location Hip    Pain Orientation Right;Left    Pain Descriptors / Indicators Sore                [facility] PT Assessment - 09/16/20 0001       Palpation   Palpation comment TTP L psoas and rectus femoris                           [facility] Adult PT Treatment/Exercise - 09/16/20 0001       Knee/Hip Exercises: Stretches   Quad Stretch 5 reps;20 seconds;Left      Knee/Hip Exercises: Standing   Hip Abduction Both;2 sets;10 reps    Abduction Limitations  cueing for glute set on stance leg      Knee/Hip Exercises: Supine   Bridges Both;2 sets;10 reps      Knee/Hip Exercises: Sidelying   Hip ABduction Both;2 sets;10 reps      Knee/Hip Exercises: Prone   Straight Leg Raises AROM;Strengthening;Both;5 reps;2 sets                    PT Education - 09/16/20 1316     Education Details HEP, anatomy, pelvic positioning    Person(s) Educated Patient    Methods Explanation;Demonstration;Handout    Comprehension Verbalized understanding;Returned demonstration              PT Short Term Goals - 08/25/20 0848       PT SHORT TERM GOAL #1   Title Patient will be able to demonstrate active left hip flexion without compensatory strategies (sartorius activated)    Time 3    Period Weeks    Status New    Target Date 09/15/20      PT SHORT TERM GOAL #2   Title Patient will report at least 50% improvement in overall symptoms and/or function to demonstrate improved functional mobility    Time 3    Period Weeks    Status New    Target Date 09/15/20      PT SHORT TERM GOAL #3   Title Patient will be independent in self management strategies to improve quality of life and functional outcomes.    Time 3    Period Weeks    Status New    Target Date 09/15/20               PT Long Term Goals - 08/25/20 0849       PT LONG TERM GOAL #1   Title Patient will demonstrate at least 4/5 MMT in hip extensors    Time 6    Period Weeks    Status New    Target Date 10/06/20      PT LONG TERM GOAL #2   Title Patient will be able to ambulate for at least 15 minutes without pain to initiate walking program for weight management goals    Time 6  Period Weeks    Status New    Target Date 10/06/20      PT LONG TERM GOAL #3   Title Patient will report at least 75% improvement in overall symptoms and/or function to demonstrate improved functional mobility    Time 6    Period Weeks    Status New    Target Date 10/06/20                    Plan - 09/16/20 1316     Clinical Impression Statement Patient stating muscle soreness today from prior session which begins decreasing with exercise. Patient TTP in L hip flexors with great tenderness at rectus femoris and psoas. Patient states decrease in symptoms following stretch but achy pain with prone hip extension. Continued with hip strengthening today which patient tolerates well. Patient given tactile and verbal cueing for proper hip abduction mechanics and avoiding compensation.  Patient will continue to benefit from skilled physical therapy in order to improve function and reduce impairment.    Personal Factors and Comorbidities Comorbidity 1;Comorbidity 2    Comorbidities B TKA, history of foot pain, history of multiple abdominal surgeries    Examination-Activity Limitations Squat;Sleep;Sit;Locomotion Level;Transfers;Stand    Examination-Participation Restrictions Community Activity;Cleaning    Stability/Clinical Decision Making Stable/Uncomplicated    Rehab Potential Good    PT Frequency 2x / week    PT Duration 6 weeks    PT Treatment/Interventions ADLs/Self Care Home Management;Neuromuscular re-education;Patient/family education;Manual techniques;Dry needling;Passive range of motion;Joint Manipulations;Spinal Manipulations;Traction;Moist Heat;Therapeutic exercise;Therapeutic activities;Electrical Stimulation;Cryotherapy    PT Next Visit Plan glute activation/posterior chain strengthening, strain/counter strain psoas/hip flexors-> isometrics at different ranges -> then retrain AROM    PT Home Exercise Plan bridges, LTR, TRA activation; 6/20 knee flexion; 6/27 STS,, side stepping, posterior pelvic tilt. 6/29 prone quad stretch             Patient will benefit from skilled therapeutic intervention in order to improve the following deficits and impairments:  Pain, Decreased strength, Decreased activity tolerance, Difficulty walking, Decreased range of  motion  Visit Diagnosis: Difficulty in walking, not elsewhere classified  Pain in left hip  Muscle weakness (generalized)     Problem List Patient Active Problem List   Diagnosis Date Noted   Non-recurrent acute suppurative otitis media of right ear without spontaneous rupture of tympanic membrane 04/16/2020   Bronchitis 04/16/2020   Duodenal ulcer 06/24/2019   Morbid obesity ([place]) 01/02/2017   History of colonic polyps 04/26/2016   OA (osteoarthritis) of knee 11/17/2014   Obstructive sleep apnea 05/28/2014   Anemia, iron deficiency 12/16/2013   Prediabetes 05/01/2013   GERD (gastroesophageal reflux disease) 02/06/2013   Hypothyroidism 12/12/2012   Insomnia 12/12/2012   Hyperlipidemia 12/12/2012   Osteoarthritis 12/12/2012   Restless legs syndrome 12/12/2012    1:58 PM, 09/16/20 Mearl Latin PT, DPT Physical Therapist at [facility] [place], [place], [address] Phone: [phone]   Fax:  [phone]  Name: Karen Vazquez MRN: [id number] Date of Birth: [birth year]

## 2020-09-17 ENCOUNTER — Ambulatory Visit (INDEPENDENT_AMBULATORY_CARE_PROVIDER_SITE_OTHER): Payer: Medicare Other | Admitting: Family Medicine

## 2020-09-17 ENCOUNTER — Other Ambulatory Visit: Payer: Self-pay

## 2020-09-17 ENCOUNTER — Encounter (INDEPENDENT_AMBULATORY_CARE_PROVIDER_SITE_OTHER): Payer: Self-pay | Admitting: Family Medicine

## 2020-09-17 VITALS — BP 130/73 | HR 72 | Temp 98.4°F | Ht 63.0 in | Wt 235.0 lb

## 2020-09-17 DIAGNOSIS — E559 Vitamin D deficiency, unspecified: Secondary | ICD-10-CM

## 2020-09-17 DIAGNOSIS — G4733 Obstructive sleep apnea (adult) (pediatric): Secondary | ICD-10-CM

## 2020-09-17 DIAGNOSIS — E8881 Metabolic syndrome: Secondary | ICD-10-CM | POA: Diagnosis not present

## 2020-09-17 DIAGNOSIS — Z9189 Other specified personal risk factors, not elsewhere classified: Secondary | ICD-10-CM

## 2020-09-17 DIAGNOSIS — Z6841 Body Mass Index (BMI) 40.0 and over, adult: Secondary | ICD-10-CM

## 2020-09-17 DIAGNOSIS — I10 Essential (primary) hypertension: Secondary | ICD-10-CM

## 2020-09-17 MED ORDER — METFORMIN HCL 500 MG PO TABS: 500.0000 mg | ORAL_TABLET | Freq: Every day | ORAL | 0 refills | Status: DC

## 2020-09-22 ENCOUNTER — Encounter (HOSPITAL_COMMUNITY): Payer: Federal, State, Local not specified - PPO | Admitting: Physical Therapy

## 2020-09-24 ENCOUNTER — Encounter (HOSPITAL_COMMUNITY): Payer: Federal, State, Local not specified - PPO | Admitting: Physical Therapy

## 2020-09-24 NOTE — Progress Notes (Signed)
Chief Complaint:   OBESITY Karen Vazquez is here to discuss her progress with her obesity treatment plan along with follow-up of her obesity related diagnoses. Karen Vazquez is on the Category 1 Plan and states she is following her eating plan approximately 85% of the time. Karen Vazquez states she is walking 15 minutes 5 times per week.  Today's visit was #: 2 Starting weight: 245 lbs Starting date: 08/20/2020 Today's weight: 235 lbs Today's date: 09/17/2020 Total lbs lost to date: 10 Total lbs lost since last in-office visit: 10  Interim History: This is Karen Vazquez's first RTC since 08/20/20. She is a returning patient. Pt has to be mindful of inclusion of bread and sweets. She reports she has been hungry, particularly 2-3 hours after meals, as she is used to eating. Pt is going to [facility] for the 4th of July.  Subjective:   1. Obstructive sleep apnea Karen Vazquez has a CPAP but doesn't use it. She reports poor sleep quality.  2. Essential hypertension Karen Vazquez's BP is well controlled today. Pt denies chest pain/chest pressure/headache.  3. Insulin resistance Karen Vazquez has an N8M of 5.3 and insulin level 19.2. She has never been on meds before.  4. Vitamin D deficiency Karen Vazquez is on Vit D 7,672 IU daily. She has a Vit D level of 43.5.  5. At risk for diabetes mellitus Karen Vazquez is at higher than average risk for developing diabetes due to obesity.   Assessment/Plan:   1. Obstructive sleep apnea Intensive lifestyle modifications are the first line treatment for this issue. We discussed several lifestyle modifications today and she will continue to work on diet, exercise and weight loss efforts. We will continue to monitor. Orders and follow up as documented in patient record. Pt is to try to start using CPAP every night.  2. Essential hypertension Karen Vazquez is working on healthy weight loss and exercise to improve blood pressure control. We will watch for signs of hypotension as she continues her lifestyle modifications.  Continue current treatment plan.  3. Insulin resistance Karen Vazquez will continue to work on weight loss, exercise, and decreasing simple carbohydrates to help decrease the risk of diabetes. Karen Vazquez agreed to follow-up with [place] as directed to closely monitor her progress. Start Metformin 500 mg, as prescribed below.  Start- metFORMIN (GLUCOPHAGE) 500 MG tablet; Take 1 tablet (500 mg total) by mouth daily with breakfast.  Dispense: 30 tablet; Refill: 0  4. Vitamin D deficiency Low Vitamin D level contributes to fatigue and are associated with obesity, breast, and colon cancer. She agrees to increase to taking OTC Vitamin D @5 ,000 IU daily and will follow-up for routine testing of Vitamin D, at least 2-3 times per year to avoid over-replacement.  5. At risk for diabetes mellitus Karen Vazquez was given approximately 15 minutes of diabetes education and counseling today. We discussed intensive lifestyle modifications today with an emphasis on weight loss as well as increasing exercise and decreasing simple carbohydrates in her diet. We also reviewed medication options with an emphasis on risk versus benefit of those discussed.   Repetitive spaced learning was employed today to elicit superior memory formation and behavioral change.   6. Class 3 severe obesity with serious comorbidity and body mass index (BMI) of 40.0 to 44.9 in adult, unspecified obesity type (HCC)  Karen Vazquez is currently in the action stage of change. As such, her goal is to continue with weight loss efforts. She has agreed to the Category 2 Plan.   Exercise goals: All adults should avoid inactivity.  Some physical activity is better than none, and adults who participate in any amount of physical activity gain some health benefits.  Behavioral modification strategies: increasing lean protein intake, meal planning and cooking strategies, keeping healthy foods in the home, and travel eating strategies.  Cameo has agreed to follow-up with our clinic in  2 weeks. She was informed of the importance of frequent follow-up visits to maximize her success with intensive lifestyle modifications for her multiple health conditions.   Objective:   Blood pressure 130/73, pulse 72, temperature 98.4 F (36.9 C), height 5\' 3"  (1.6 m), weight 235 lb (106.6 kg), last menstrual period 05/24/2010, SpO2 96 %. Body mass index is 41.63 kg/m.  General: Cooperative, alert, well developed, in no acute distress. HEENT: Conjunctivae and lids unremarkable. Cardiovascular: Regular rhythm.  Lungs: Normal work of breathing. Neurologic: No focal deficits.   Lab Results  Component Value Date   CREATININE 0.92 02/06/2020   BUN 13 02/06/2020   NA 141 02/06/2020   K 5.0 02/06/2020   CL 103 02/06/2020   CO2 25 02/06/2020   Lab Results  Component Value Date   ALT 16 05/15/2019   AST 17 05/15/2019   ALKPHOS 97 05/15/2019   BILITOT 0.5 05/15/2019   Lab Results  Component Value Date   HGBA1C 5.3 08/20/2020   HGBA1C 5.3 07/24/2018   HGBA1C 5.4 11/28/2017   HGBA1C 5.2 07/20/2017   HGBA1C 5.4 06/12/2014   Lab Results  Component Value Date   INSULIN 19.2 08/20/2020   INSULIN 15.8 07/24/2018   INSULIN 7.8 11/28/2017   INSULIN 12.5 07/20/2017   Lab Results  Component Value Date   TSH 2.710 07/20/2020   Lab Results  Component Value Date   CHOL 180 07/20/2020   HDL 35 (L) 07/20/2020   LDLCALC 123 (H) 07/20/2020   TRIG 118 07/20/2020   CHOLHDL 5.1 (H) 07/20/2020   Lab Results  Component Value Date   VD25OH 43.5 08/20/2020   VD25OH 32.1 02/06/2020   VD25OH 34.3 11/04/2019   Lab Results  Component Value Date   WBC 5.4 07/20/2020   HGB 12.5 07/20/2020   HCT 39.2 07/20/2020   MCV 84 07/20/2020   PLT 174 07/20/2020   Lab Results  Component Value Date   IRON 86 02/06/2020   TIBC 399 02/06/2020   FERRITIN 23 07/20/2020    Attestation Statements:   Reviewed by clinician on day of visit: allergies, medications, problem list, medical history,  surgical history, family history, social history, and previous encounter notes.  Coral Ceo, CMA, am acting as transcriptionist for Coralie Common, MD.  I have reviewed the above documentation for accuracy and completeness, and I agree with the above. - Jinny Blossom, MD

## 2020-09-28 ENCOUNTER — Encounter (HOSPITAL_COMMUNITY): Payer: Federal, State, Local not specified - PPO | Admitting: Physical Therapy

## 2020-09-30 ENCOUNTER — Encounter (HOSPITAL_COMMUNITY): Payer: Self-pay | Admitting: Physical Therapy

## 2020-09-30 ENCOUNTER — Other Ambulatory Visit: Payer: Self-pay

## 2020-09-30 ENCOUNTER — Ambulatory Visit (HOSPITAL_COMMUNITY): Payer: Medicare Other | Attending: Family Medicine | Admitting: Physical Therapy

## 2020-09-30 DIAGNOSIS — R262 Difficulty in walking, not elsewhere classified: Secondary | ICD-10-CM

## 2020-09-30 DIAGNOSIS — M25552 Pain in left hip: Secondary | ICD-10-CM | POA: Diagnosis present

## 2020-09-30 DIAGNOSIS — M6281 Muscle weakness (generalized): Secondary | ICD-10-CM

## 2020-09-30 NOTE — Therapy (Signed)
[facility] [address] [place], [place], [address] Phone: [phone]   Fax:  [phone]  Physical Therapy Treatment/Discharge Summary  Patient Details  Name: Karen Vazquez MRN: [id number] Date of Birth: 04-23-1955 Referring Provider (PT): Kathyrn Drown MD   Encounter Date: 09/30/2020  PHYSICAL THERAPY DISCHARGE SUMMARY  Visits from Start of Care: 6  Current functional level related to goals / functional outcomes: See below   Remaining deficits: See below   Education / Equipment: See below   Patient agrees to discharge. Patient goals were met. Patient is being discharged due to meeting the stated rehab goals.    PT End of Session - 09/30/20 1005     Visit Number 6    Number of Visits 12    Date for PT Re-Evaluation 10/06/20    Authorization Type medicare primary and BCBS secondary, no auth    Progress Note Due on Visit 10    PT Start Time 1005    PT Stop Time 1028    PT Time Calculation (min) 23 min    Activity Tolerance Patient tolerated treatment well    Behavior During Therapy WFL for tasks assessed/performed             Past Medical History:  Diagnosis Date   Anemia    Anxiety    Arthritis    Back pain    Blood transfusion 2011   at [facility]   Bulging lumbar disc    Chest pain    Depression    Dry mouth    Easy bruising    Excessive thirst    Fatigue    Frequent urination    GERD (gastroesophageal reflux disease)    Headache    Heat intolerance    Hypertension    Hypothyroidism    Joint pain    Leg pain    Muscle stiffness    Nervousness    Palpitations    Pre-diabetes    Restless leg syndrome    Shortness of breath on exertion    Sleep apnea    does not use c-pap machine   Stomach ulcer    Stress    Swelling of both lower extremities    Trouble in sleeping    Vitamin D deficiency    Weakness     Past Surgical History:  Procedure Laterality Date   APPENDECTOMY     BIOPSY   05/16/2019   Procedure: BIOPSY;  Surgeon: Rogene Houston, MD;  Location: [facility];  Service: Endoscopy;;  duodenum antral   BLADDER SUSPENSION  12/29/2010   Procedure: TRANSVAGINAL TAPE (TVT) PROCEDURE;  Surgeon: Daria Pastures;  Location: [facility];  Service: Gynecology;  Laterality: N/A;   BREAST SURGERY     left breast lumpectomy 1977   CATARACT EXTRACTION     COLONOSCOPY N/A 03/03/2016   Procedure: COLONOSCOPY;  Surgeon: Rogene Houston, MD;  Location: [facility];  Service: Endoscopy;  Laterality: N/A;  2:00 - moved to 12/14 @ 12:55 - Ann notified pt   CYSTOCELE REPAIR  12/29/2010   Procedure: ANTERIOR REPAIR (CYSTOCELE);  Surgeon: Daria Pastures;  Location: [facility];  Service: Gynecology;  Laterality: N/A;   CYSTOSCOPY  12/29/2010   Procedure: CYSTOSCOPY;  Surgeon: Daria Pastures;  Location: [facility];  Service: Gynecology;  Laterality: N/A;   ESOPHAGOGASTRODUODENOSCOPY N/A 05/16/2019   Procedure: ESOPHAGOGASTRODUODENOSCOPY (EGD);  Surgeon: Rogene Houston, MD;  Location: [facility];  Service: Endoscopy;  Laterality: N/A;   JOINT REPLACEMENT  2011   rt knee   REPLACEMENT TOTAL KNEE Right 2011   TONSILLECTOMY     TOTAL KNEE ARTHROPLASTY Left 11/17/2014   Procedure: LEFT TOTAL KNEE ARTHROPLASTY;  Surgeon: Gaynelle Arabian, MD;  Location: [facility];  Service: Orthopedics;  Laterality: Left;   TUBAL LIGATION     VAGINAL HYSTERECTOMY  12/29/2010   Procedure: HYSTERECTOMY VAGINAL;  Surgeon: Daria Pastures;  Location: [facility];  Service: Gynecology;  Laterality: N/A;    There were no vitals filed for this visit.   Subjective Assessment - 09/30/20 1006     Subjective Patient states hip sore with periods of sitting. She thinks the exercises have helped and she has not had pain other than with sitting. She is doing well with exercises. Patient states 90% improvement with PT intervention with remaining deficits with sitting.    Pertinent History B TKA, right rotator cuff issues     Currently in Pain? No/denies                [facility] PT Assessment - 09/30/20 0001       Assessment   Medical Diagnosis left hip pain    Referring Provider (PT) Kathyrn Drown MD    Prior Therapy yes for her knees      Precautions   Precautions None      Restrictions   Weight Bearing Restrictions No      Balance Screen   Has the patient fallen in the past 6 months No    Has the patient had a decrease in activity level because of a fear of falling?  No    Is the patient reluctant to leave their home because of a fear of falling?  No      Prior Function   Level of Independence Independent      Cognition   Overall Cognitive Status Within Functional Limits for tasks assessed      Observation/Other Assessments   Focus on Therapeutic Outcomes (FOTO)  NA      Strength   Right Hip Flexion 4+/5    Right Hip Extension 4/5    Right Hip ABduction 4+/5    Left Hip Flexion 4+/5    Left Hip Extension 4+/5    Left Hip ABduction 4+/5    Right Knee Flexion 5/5    Right Knee Extension 5/5    Left Knee Flexion 5/5    Left Knee Extension 5/5    Right Ankle Dorsiflexion 5/5    Left Ankle Dorsiflexion 5/5                                   PT Education - 09/30/20 1006     Education Details HEP, reassessment findings, returning to PT if needed    Person(s) Educated Patient    Methods Explanation;Demonstration    Comprehension Verbalized understanding              PT Short Term Goals - 09/30/20 1013       PT SHORT TERM GOAL #1   Title Patient will be able to demonstrate active left hip flexion without compensatory strategies (sartorius activated)    Time 3    Period Weeks    Status Achieved    Target Date 09/15/20      PT SHORT TERM GOAL #2   Title Patient will report at least 50% improvement in overall  symptoms and/or function to demonstrate improved functional mobility    Time 3    Period Weeks    Status Achieved    Target Date  09/15/20      PT SHORT TERM GOAL #3   Title Patient will be independent in self management strategies to improve quality of life and functional outcomes.    Time 3    Period Weeks    Status Achieved    Target Date 09/15/20               PT Long Term Goals - 09/30/20 1013       PT LONG TERM GOAL #1   Title Patient will demonstrate at least 4/5 MMT in hip extensors    Time 6    Period Weeks    Status Achieved      PT LONG TERM GOAL #2   Title Patient will be able to ambulate for at least 15 minutes without pain to initiate walking program for weight management goals    Time 6    Period Weeks    Status Achieved      PT LONG TERM GOAL #3   Title Patient will report at least 75% improvement in overall symptoms and/or function to demonstrate improved functional mobility    Time 6    Period Weeks    Status Achieved                   Plan - 09/30/20 1006     Clinical Impression Statement Patient has met all short and long term goals with ability to complete HEP and improvement in symptoms, gait, strength, and activity tolerance. Patient remains limited by intermittent pain with prolonged sitting but is able to manage symptoms with HEP. Patient showing good strength and is educated on continuing HEP. Reviewed HEP and educated patient on returning if needed. Patient discharged from physical therapy at this time.    Personal Factors and Comorbidities Comorbidity 1;Comorbidity 2    Comorbidities B TKA, history of foot pain, history of multiple abdominal surgeries    Examination-Activity Limitations Squat;Sleep;Sit;Locomotion Level;Transfers;Stand    Examination-Participation Restrictions Community Activity;Cleaning    Stability/Clinical Decision Making Stable/Uncomplicated    Rehab Potential Good    PT Frequency --    PT Duration --    PT Treatment/Interventions ADLs/Self Care Home Management;Neuromuscular re-education;Patient/family education;Manual techniques;Dry  needling;Passive range of motion;Joint Manipulations;Spinal Manipulations;Traction;Moist Heat;Therapeutic exercise;Therapeutic activities;Electrical Stimulation;Cryotherapy    PT Next Visit Plan n/a    PT Home Exercise Plan bridges, LTR, TRA activation; 6/20 knee flexion; 6/27 STS,, side stepping, posterior pelvic tilt. 6/29 prone quad stretch 7/13 hip abduction/ extension             Patient will benefit from skilled therapeutic intervention in order to improve the following deficits and impairments:  Pain, Decreased strength, Decreased activity tolerance, Difficulty walking, Decreased range of motion  Visit Diagnosis: Difficulty in walking, not elsewhere classified  Pain in left hip  Muscle weakness (generalized)     Problem List Patient Active Problem List   Diagnosis Date Noted   Non-recurrent acute suppurative otitis media of right ear without spontaneous rupture of tympanic membrane 04/16/2020   Bronchitis 04/16/2020   Duodenal ulcer 06/24/2019   Morbid obesity ([place]) 01/02/2017   History of colonic polyps 04/26/2016   OA (osteoarthritis) of knee 11/17/2014   Obstructive sleep apnea 05/28/2014   Anemia, iron deficiency 12/16/2013   Prediabetes 05/01/2013   GERD (gastroesophageal reflux disease) 02/06/2013   Hypothyroidism  12/12/2012   Insomnia 12/12/2012   Hyperlipidemia 12/12/2012   Osteoarthritis 12/12/2012   Restless legs syndrome 12/12/2012    10:33 AM, 09/30/20 Mearl Latin PT, DPT Physical Therapist at [facility] [place], [place], [address] Phone: [phone]   Fax:  [phone]  Name: Karen Vazquez MRN: [id number] Date of Birth: [birth year]

## 2020-10-05 ENCOUNTER — Encounter (HOSPITAL_COMMUNITY): Payer: Federal, State, Local not specified - PPO | Admitting: Physical Therapy

## 2020-10-07 ENCOUNTER — Ambulatory Visit (INDEPENDENT_AMBULATORY_CARE_PROVIDER_SITE_OTHER): Payer: Medicare Other | Admitting: Family Medicine

## 2020-10-07 ENCOUNTER — Encounter (INDEPENDENT_AMBULATORY_CARE_PROVIDER_SITE_OTHER): Payer: Self-pay | Admitting: Family Medicine

## 2020-10-07 ENCOUNTER — Encounter (HOSPITAL_COMMUNITY): Payer: Federal, State, Local not specified - PPO | Admitting: Physical Therapy

## 2020-10-07 ENCOUNTER — Other Ambulatory Visit: Payer: Self-pay

## 2020-10-07 VITALS — BP 142/84 | HR 65 | Temp 98.1°F | Ht 63.0 in | Wt 232.0 lb

## 2020-10-07 DIAGNOSIS — I10 Essential (primary) hypertension: Secondary | ICD-10-CM

## 2020-10-07 DIAGNOSIS — R7303 Prediabetes: Secondary | ICD-10-CM

## 2020-10-07 DIAGNOSIS — E8881 Metabolic syndrome: Secondary | ICD-10-CM

## 2020-10-07 DIAGNOSIS — Z6841 Body Mass Index (BMI) 40.0 and over, adult: Secondary | ICD-10-CM

## 2020-10-07 MED ORDER — METFORMIN HCL 500 MG PO TABS: 500.0000 mg | ORAL_TABLET | Freq: Every day | ORAL | 0 refills | Status: DC

## 2020-10-12 NOTE — Progress Notes (Signed)
Chief Complaint:   OBESITY Chloey is here to discuss her progress with her obesity treatment plan along with follow-up of her obesity related diagnoses. Leeah is on the Category 1 Plan and the Category 2 Plan and states she is following her eating plan approximately 50% of the time. Maham states she is walking 15 minutes 4 times per week.  Today's visit was #: 3 Starting weight: 245 lbs Starting date: 08/20/2020 Today's weight: 232 lbs Today's date: 10/07/2020 Total lbs lost to date: 13 Total lbs lost since last in-office visit: 3  Interim History: Preslei went to [facility] for a week with 25 other people. She voices that she had a great while away. Antoine is leaving for the beach in the next few days. She is going to be mindful of food choices while away.  Subjective:   1. Prediabetes Aerial has a diagnosis of prediabetes based on her elevated HgA1c of 5.3 and an insulin of 19.2. She was informed this puts her at greater risk of developing diabetes. She continues to work on diet and exercise to decrease her risk of diabetes. She denies nausea or hypoglycemia.  Lab Results  Component Value Date   HGBA1C 5.3 08/20/2020   Lab Results  Component Value Date   INSULIN 19.2 08/20/2020   INSULIN 15.8 07/24/2018   INSULIN 7.8 11/28/2017   INSULIN 12.5 07/20/2017     2. Essential hypertension Oluwatoni's blood pressure was slightly elevated blood pressure today. She denies chest pain, chest pressure, or headaches.  BP Readings from Last 3 Encounters:  10/07/20 (!) 142/84  09/17/20 130/73  08/20/20 128/74   Lab Results  Component Value Date   CREATININE 0.92 02/06/2020   CREATININE 0.94 05/27/2019   CREATININE 0.87 05/15/2019     Assessment/Plan:   1. Prediabetes Rhaniya will continue to work on weight loss, exercise, and decreasing simple carbohydrates to help decrease the risk of diabetes.    - metFORMIN (GLUCOPHAGE) 500 MG tablet; Take 1 tablet (500 mg total) by mouth daily  with breakfast.  Dispense: 30 tablet; Refill: 0  2. Essential hypertension Aritzy is working on healthy weight loss and exercise to improve blood pressure control. Kameelah agrees to continue amlodipine. We will watch for signs of hypotension as she continues her lifestyle modifications.  3. Obesity Current BMI [id number] Mikinzie is currently in the action stage of change. As such, her goal is to continue with weight loss efforts. She has agreed to the Category 2 Plan.   Exercise goals: No exercise has been prescribed at this time.  Behavioral modification strategies: increasing lean protein intake, meal planning and cooking strategies, keeping healthy foods in the home, and travel eating strategies.  Tatiyanna has agreed to follow-up with our clinic in 2 weeks. She was informed of the importance of frequent follow-up visits to maximize her success with intensive lifestyle modifications for her multiple health conditions.   Objective:   Blood pressure (!) 142/84, pulse 65, temperature 98.1 F (36.7 C), height '5\' 3"'$  (1.6 m), weight 232 lb (105.2 kg), last menstrual period 05/24/2010, SpO2 98 %. Body mass index is 41.1 kg/m.  General: Cooperative, alert, well developed, in no acute distress. HEENT: Conjunctivae and lids unremarkable. Cardiovascular: Regular rhythm.  Lungs: Normal work of breathing. Neurologic: No focal deficits.   Lab Results  Component Value Date   CREATININE 0.92 02/06/2020   BUN 13 02/06/2020   NA 141 02/06/2020   K 5.0 02/06/2020   CL 103 02/06/2020  CO2 25 02/06/2020   Lab Results  Component Value Date   ALT 16 05/15/2019   AST 17 05/15/2019   ALKPHOS 97 05/15/2019   BILITOT 0.5 05/15/2019   Lab Results  Component Value Date   HGBA1C 5.3 08/20/2020   HGBA1C 5.3 07/24/2018   HGBA1C 5.4 11/28/2017   HGBA1C 5.2 07/20/2017   HGBA1C 5.4 06/12/2014   Lab Results  Component Value Date   INSULIN 19.2 08/20/2020   INSULIN 15.8 07/24/2018   INSULIN 7.8 11/28/2017    INSULIN 12.5 07/20/2017   Lab Results  Component Value Date   TSH 2.710 07/20/2020   Lab Results  Component Value Date   CHOL 180 07/20/2020   HDL 35 (L) 07/20/2020   LDLCALC 123 (H) 07/20/2020   TRIG 118 07/20/2020   CHOLHDL 5.1 (H) 07/20/2020   Lab Results  Component Value Date   VD25OH 43.5 08/20/2020   VD25OH 32.1 02/06/2020   VD25OH 34.3 11/04/2019   Lab Results  Component Value Date   WBC 5.4 07/20/2020   HGB 12.5 07/20/2020   HCT 39.2 07/20/2020   MCV 84 07/20/2020   PLT 174 07/20/2020   Lab Results  Component Value Date   IRON 86 02/06/2020   TIBC 399 02/06/2020   FERRITIN 23 07/20/2020    Obesity Behavioral Intervention:   Approximately 15 minutes were spent on the discussion below.  ASK: We discussed the diagnosis of obesity with Lelia today and Tifanie agreed to give [place] permission to discuss obesity behavioral modification therapy today.  ASSESS: Sherial has the diagnosis of obesity and her BMI today is 41.2. Denetra is in the action stage of change.   ADVISE: Licet was educated on the multiple health risks of obesity as well as the benefit of weight loss to improve her health. She was advised of the need for long term treatment and the importance of lifestyle modifications to improve her current health and to decrease her risk of future health problems.  AGREE: Multiple dietary modification options and treatment options were discussed and Leontyne agreed to follow the recommendations documented in the above note.  ARRANGE: Avika was educated on the importance of frequent visits to treat obesity as outlined per CMS and USPSTF guidelines and agreed to schedule her next follow up appointment today.  Attestation Statements:   Reviewed by clinician on day of visit: allergies, medications, problem list, medical history, surgical history, family history, social history, and previous encounter notes.  IMarcille Blanco, CMA, am acting as transcriptionist for  Coralie Common, MD   I have reviewed the above documentation for accuracy and completeness, and I agree with the above. - Coralie Common, MD

## 2020-10-19 ENCOUNTER — Ambulatory Visit (INDEPENDENT_AMBULATORY_CARE_PROVIDER_SITE_OTHER): Payer: Medicare Other | Admitting: Family Medicine

## 2020-10-19 ENCOUNTER — Encounter (INDEPENDENT_AMBULATORY_CARE_PROVIDER_SITE_OTHER): Payer: Self-pay

## 2020-10-19 ENCOUNTER — Encounter (INDEPENDENT_AMBULATORY_CARE_PROVIDER_SITE_OTHER): Payer: Self-pay | Admitting: Adult Health

## 2020-10-19 ENCOUNTER — Other Ambulatory Visit: Payer: Self-pay

## 2020-10-19 ENCOUNTER — Ambulatory Visit (INDEPENDENT_AMBULATORY_CARE_PROVIDER_SITE_OTHER): Payer: Medicare Other | Admitting: Adult Health

## 2020-10-19 VITALS — BP 117/72 | HR 83 | Temp 98.1°F | Ht 63.0 in | Wt 233.0 lb

## 2020-10-19 DIAGNOSIS — I10 Essential (primary) hypertension: Secondary | ICD-10-CM | POA: Diagnosis not present

## 2020-10-19 DIAGNOSIS — Z6841 Body Mass Index (BMI) 40.0 and over, adult: Secondary | ICD-10-CM

## 2020-10-19 DIAGNOSIS — G4733 Obstructive sleep apnea (adult) (pediatric): Secondary | ICD-10-CM | POA: Diagnosis not present

## 2020-10-19 DIAGNOSIS — R7303 Prediabetes: Secondary | ICD-10-CM

## 2020-10-21 NOTE — Progress Notes (Signed)
Chief Complaint:   OBESITY Karen Vazquez is here to discuss her progress with her obesity treatment plan along with follow-up of her obesity related diagnoses. Karen Vazquez is on the Category 2 Plan and states she is following her eating plan approximately 50% of the time. Karen Vazquez states she is walking for 15 minutes 3 times per week.  Today's visit was #: 4 Starting weight: 245 lbs Starting date: 08/20/2020 Today's weight: 233 lbs Today's date: 10/19/2020 Total lbs lost to date: 12 lbs Total lbs lost since last in-office visit: 0  Interim History: Karen Vazquez recently traveled to [facility] for 8 days.  She ate off plan with faily during coastal vacation. She did try to walk as frequently as she could while at the beach.  Subjective:   1. Essential hypertension Blood pressure and heart rate excellent at office visit.  She has been more compliant with nightly CPAP.  BP Readings from Last 3 Encounters:  10/19/20 117/72  10/07/20 (!) 142/84  09/17/20 130/73   2. Obstructive sleep apnea She was able to use her CPAP 5 out of 7 days last week.  3. Prediabetes She is on metformin 500 mg at breakfast - denies GI upset.  Lab Results  Component Value Date   HGBA1C 5.3 08/20/2020   Lab Results  Component Value Date   INSULIN 19.2 08/20/2020   INSULIN 15.8 07/24/2018   INSULIN 7.8 11/28/2017   INSULIN 12.5 07/20/2017   Assessment/Plan:   1. Essential hypertension Karen Vazquez is working on healthy weight loss and exercise to improve blood pressure control. We will watch for signs of hypotension as she continues her lifestyle modifications.  Continue daily amlodipine 2.5 mg daily.  2. Obstructive sleep apnea Intensive lifestyle modifications are the first line treatment for this issue. We discussed several lifestyle modifications today and she will continue to work on diet, exercise and weight loss efforts. We will continue to monitor. Orders and follow up as documented in patient record.  Continue  nightly CPAP.  3. Prediabetes Karen Vazquez will continue to work on weight loss, exercise, and decreasing simple carbohydrates to help decrease the risk of diabetes.  Continue metformin.  No need for refill.  4. Obesity Current BMI [id number]  Karen Vazquez is currently in the action stage of change. As such, her goal is to continue with weight loss efforts. She has agreed to the Category 2 Plan.   Handout:  High Protein/Low Calorie.  Protein List of Foods.  Exercise goals:  As is.  Behavioral modification strategies: increasing lean protein intake, decreasing simple carbohydrates, meal planning and cooking strategies, keeping healthy foods in the home, and planning for success.  Karen Vazquez has agreed to follow-up with our clinic in 2 weeks. She was informed of the importance of frequent follow-up visits to maximize her success with intensive lifestyle modifications for her multiple health conditions.   Objective:   Blood pressure 117/72, pulse 83, temperature 98.1 F (36.7 C), height '5\' 3"'$  (1.6 m), weight 233 lb (105.7 kg), last menstrual period 05/24/2010, SpO2 96 %. Body mass index is 41.27 kg/m.  General: Cooperative, alert, well developed, in no acute distress. HEENT: Conjunctivae and lids unremarkable. Cardiovascular: Regular rhythm.  Lungs: Normal work of breathing. Neurologic: No focal deficits.   Lab Results  Component Value Date   CREATININE 0.92 02/06/2020   BUN 13 02/06/2020   NA 141 02/06/2020   K 5.0 02/06/2020   CL 103 02/06/2020   CO2 25 02/06/2020   Lab Results  Component Value  Date   ALT 16 05/15/2019   AST 17 05/15/2019   ALKPHOS 97 05/15/2019   BILITOT 0.5 05/15/2019   Lab Results  Component Value Date   HGBA1C 5.3 08/20/2020   HGBA1C 5.3 07/24/2018   HGBA1C 5.4 11/28/2017   HGBA1C 5.2 07/20/2017   HGBA1C 5.4 06/12/2014   Lab Results  Component Value Date   INSULIN 19.2 08/20/2020   INSULIN 15.8 07/24/2018   INSULIN 7.8 11/28/2017   INSULIN 12.5 07/20/2017   Lab  Results  Component Value Date   TSH 2.710 07/20/2020   Lab Results  Component Value Date   CHOL 180 07/20/2020   HDL 35 (L) 07/20/2020   LDLCALC 123 (H) 07/20/2020   TRIG 118 07/20/2020   CHOLHDL 5.1 (H) 07/20/2020   Lab Results  Component Value Date   VD25OH 43.5 08/20/2020   VD25OH 32.1 02/06/2020   VD25OH 34.3 11/04/2019   Lab Results  Component Value Date   WBC 5.4 07/20/2020   HGB 12.5 07/20/2020   HCT 39.2 07/20/2020   MCV 84 07/20/2020   PLT 174 07/20/2020   Lab Results  Component Value Date   IRON 86 02/06/2020   TIBC 399 02/06/2020   FERRITIN 23 07/20/2020   Attestation Statements:   Reviewed by clinician on day of visit: allergies, medications, problem list, medical history, surgical history, family history, social history, and previous encounter notes.  Time spent on visit including pre-visit chart review and post-visit care and charting was 30 minutes.   I, Water quality scientist, CMA, am acting as Location manager for Mina Marble, NP.  I have reviewed the above documentation for accuracy and completeness, and I agree with the above. -  Lourine Alberico d. Yasuko Lapage, NP-C

## 2020-10-27 ENCOUNTER — Other Ambulatory Visit: Payer: Self-pay

## 2020-10-27 ENCOUNTER — Encounter (INDEPENDENT_AMBULATORY_CARE_PROVIDER_SITE_OTHER): Payer: Self-pay | Admitting: Internal Medicine

## 2020-10-27 ENCOUNTER — Ambulatory Visit (INDEPENDENT_AMBULATORY_CARE_PROVIDER_SITE_OTHER): Payer: Medicare Other | Admitting: Internal Medicine

## 2020-10-27 VITALS — BP 123/79 | HR 81 | Ht 63.0 in | Wt 232.0 lb

## 2020-10-27 DIAGNOSIS — K219 Gastro-esophageal reflux disease without esophagitis: Secondary | ICD-10-CM | POA: Diagnosis not present

## 2020-10-27 DIAGNOSIS — Z862 Personal history of diseases of the blood and blood-forming organs and certain disorders involving the immune mechanism: Secondary | ICD-10-CM | POA: Insufficient documentation

## 2020-10-27 NOTE — Patient Instructions (Addendum)
Please notify if you have epigastric pain melena or rectal bleeding or if pantoprazole does not control heartburn anymore. Please ask your physician to also check serum ferritin when you have blood work later this year.

## 2020-10-27 NOTE — Progress Notes (Signed)
Presenting complaint;  History of GERD and iron deficiency anemia.  Database and subjective:  Patient is 65 year old Caucasian female who was 64 history of iron deficiency anemia, history of duodenal ulcer known large hiatal hernia who is here for scheduled visit.  She did require blood transfusion in February 2021 her hemoglobin dropped to 5.5 g.  She was then found to have duodenal ulcer secondary to NSAID therapy.  Gastric biopsy was negative for H. pylori.  She has been maintained on low-dose iron. Patient states she is doing well.  She denies heartburn dysphagia nausea or vomiting.  She keeps her head end of bed elevated.  Bowels move daily.  She denies melena or rectal bleeding or abdominal pain.  She states she tries to eat red meat 4 times a week.  She has sleep apnea and uses CPAP.  She says she has lost 18 pounds in the last 3 months.  She is going to [facility] and under care of Dr. Coralie Common.  She is hoping to get to below 200 pounds. She has a history of rest leg syndrome.  Requip alone was not helping.  Dr. Wolfgang Phoenix started on low-dose Lyrica which has helped a great deal.  She has a history of colonic adenomas.  Last colonoscopy was in December 2017 with removal of small tubular adenoma and she was advised to wait 7 years before next exam.  Current Medications: Outpatient Encounter Medications as of 10/27/2020  Medication Sig   albuterol (PROVENTIL HFA;VENTOLIN HFA) 108 (90 Base) MCG/ACT inhaler Inhale 2 puffs into the lungs every 6 (six) hours as needed for wheezing or shortness of breath.   ALPRAZolam (XANAX) 0.5 MG tablet TAKE ONE TABLET BY MOUTH TWO TIMES DAILYAS NEEDED FOR ANXIETY OR SLEEP. (Patient taking differently: Take 0.5 mg by mouth 2 (two) times daily as needed for anxiety.)   amLODipine (NORVASC) 2.5 MG tablet Take 1 tablet (2.5 mg total) by mouth daily.   ARMOUR THYROID 15 MG tablet TAKE ONE TABLET ONCE DAILY   Cholecalciferol (VITAMIN D) 50 MCG  (2000 UT) CAPS Take by mouth.   fluticasone (FLONASE) 50 MCG/ACT nasal spray Place 2 sprays into both nostrils daily.   metFORMIN (GLUCOPHAGE) 500 MG tablet Take 1 tablet (500 mg total) by mouth daily with breakfast.   OVER THE COUNTER MEDICATION Vitamin b12   pantoprazole (PROTONIX) 40 MG tablet Take 1 tablet (40 mg total) by mouth daily.   Pediatric Multiple Vit-C-FA (FLINSTONES GUMMIES OMEGA-3 DHA) CHEW Chew 18 mg by mouth daily.   pregabalin (LYRICA) 50 MG capsule 1 twice daily   thyroid (ARMOUR THYROID) 30 MG tablet TAKE ONE (1) TABLET BY MOUTH EVERY DAY   zolpidem (AMBIEN) 5 MG tablet TAKE ONE TABLET (5MG TOTAL) BY MOUTH AT BEDTIME AS NEEDED FOR SLEEP   No facility-administered encounter medications on file as of 10/27/2020.     Objective: Blood pressure 123/79, pulse 81, height 5' 3"  (1.6 m), weight 232 lb (105.2 kg), last menstrual period 05/24/2010. Patient is alert and in no acute distress. Conjunctiva is pink. Sclera is nonicteric Oropharyngeal mucosa is normal. No neck masses or thyromegaly noted. Cardiac exam with regular rhythm normal S1 and S2. No murmur or gallop noted. Lungs are clear to auscultation. Abdomen is full but soft and nontender with organomegaly or masses. No LE edema or clubbing noted.  Labs/studies Results:   CBC Latest Ref Rng & Units 07/20/2020 02/24/2020 11/04/2019  WBC 3.4 - 10.8 x10E3/uL 5.4 6.8 7.0  Hemoglobin  11.1 - 15.9 g/dL 12.5 13.6 13.6  Hematocrit 34.0 - 46.6 % 39.2 42.6 42.4  Platelets 150 - 450 x10E3/uL 174 188 205    CMP Latest Ref Rng & Units 02/06/2020 05/27/2019 05/15/2019  Glucose 65 - 99 mg/dL 84 113(H) 110(H)  BUN 8 - 27 mg/dL 13 13 16   Creatinine 0.57 - 1.00 mg/dL 0.92 0.94 0.87  Sodium 134 - 144 mmol/L 141 141 138  Potassium 3.5 - 5.2 mmol/L 5.0 5.0 4.2  Chloride 96 - 106 mmol/L 103 103 103  CO2 20 - 29 mmol/L 25 23 25   Calcium 8.7 - 10.3 mg/dL 9.7 9.5 9.2  Total Protein 6.5 - 8.1 g/dL - - 6.9  Total Bilirubin 0.3 - 1.2 mg/dL -  - 0.5  Alkaline Phos 38 - 126 U/L - - 97  AST 15 - 41 U/L - - 17  ALT 0 - 44 U/L - - 16    Hepatic Function Latest Ref Rng & Units 05/15/2019 05/14/2019 07/24/2018  Total Protein 6.5 - 8.1 g/dL 6.9 6.7 6.8  Albumin 3.5 - 5.0 g/dL 3.9 4.2 4.4  AST 15 - 41 U/L 17 19 21   ALT 0 - 44 U/L 16 9 17   Alk Phosphatase 38 - 126 U/L 97 111 127(H)  Total Bilirubin 0.3 - 1.2 mg/dL 0.5 0.3 0.4  Bilirubin, Direct 0.00 - 0.40 mg/dL - 0.09 -    Serum iron 23 on 07/20/2020 Serum iron 29 on 02/06/2020 Serum iron 6 on 05/14/2019   Assessment:  #1.  History of iron deficiency anemia.  Acute on chronic anemia 18 months ago secondary to GI bleed due to duodenal ulcer but she has had anemia chronically most likely due to impaired iron absorption as she is chronically acid suppressed.  She needs PPI because she has large hiatal hernia and chronic GERD.  H&H is normal but serum ferritin is low normal.  Therefore she should continue low-dose iron which she is tolerating well.  #2.  Chronic GERD.  She has known large hiatal hernia.  She is doing well with therapy.  #3.  History of colonic polyps.  She only had 1 small tubular adenoma in December 2017.  Next surveillance exam due in December 2024.   Plan:  Continue pantoprazole 40 mg p.o. every morning. Continue [place] chewable with iron 18 mg daily. She will have serum ferritin with her next blood work in 2 to 3 months. Patient will call office if she has dysphagia melena rectal bleeding or if pantoprazole stops working. Office visit in 1 year.

## 2020-11-03 ENCOUNTER — Encounter (INDEPENDENT_AMBULATORY_CARE_PROVIDER_SITE_OTHER): Payer: Self-pay | Admitting: Adult Health

## 2020-11-03 ENCOUNTER — Other Ambulatory Visit: Payer: Self-pay

## 2020-11-03 ENCOUNTER — Ambulatory Visit (INDEPENDENT_AMBULATORY_CARE_PROVIDER_SITE_OTHER): Payer: Medicare Other | Admitting: Adult Health

## 2020-11-03 VITALS — BP 155/78 | HR 69 | Temp 97.9°F | Ht 63.0 in | Wt 230.0 lb

## 2020-11-03 DIAGNOSIS — Z6841 Body Mass Index (BMI) 40.0 and over, adult: Secondary | ICD-10-CM | POA: Diagnosis not present

## 2020-11-03 DIAGNOSIS — R7303 Prediabetes: Secondary | ICD-10-CM

## 2020-11-03 DIAGNOSIS — G4733 Obstructive sleep apnea (adult) (pediatric): Secondary | ICD-10-CM | POA: Diagnosis not present

## 2020-11-03 DIAGNOSIS — I10 Essential (primary) hypertension: Secondary | ICD-10-CM

## 2020-11-03 MED ORDER — METFORMIN HCL 500 MG PO TABS: 500.0000 mg | ORAL_TABLET | Freq: Every day | ORAL | 0 refills | Status: DC

## 2020-11-04 LAB — COMPREHENSIVE METABOLIC PANEL
ALT: 14 IU/L (ref 0–32)
AST: 20 IU/L (ref 0–40)
Albumin/Globulin Ratio: 2 (ref 1.2–2.2)
Albumin: 4.6 g/dL (ref 3.8–4.8)
Alkaline Phosphatase: 117 IU/L (ref 44–121)
BUN/Creatinine Ratio: 13 (ref 12–28)
BUN: 11 mg/dL (ref 8–27)
Bilirubin Total: 0.4 mg/dL (ref 0.0–1.2)
CO2: 25 mmol/L (ref 20–29)
Calcium: 9.6 mg/dL (ref 8.7–10.3)
Chloride: 103 mmol/L (ref 96–106)
Creatinine, Ser: 0.86 mg/dL (ref 0.57–1.00)
Globulin, Total: 2.3 g/dL (ref 1.5–4.5)
Glucose: 85 mg/dL (ref 65–99)
Potassium: 5 mmol/L (ref 3.5–5.2)
Sodium: 143 mmol/L (ref 134–144)
Total Protein: 6.9 g/dL (ref 6.0–8.5)
eGFR: 75 mL/min/{1.73_m2} (ref >59)

## 2020-11-04 NOTE — Progress Notes (Signed)
Chief Complaint:   OBESITY Karen Vazquez is here to discuss her progress with her obesity treatment plan along with follow-up of her obesity related diagnoses. Karen Vazquez is on the Category 2 Plan and states she is following her eating plan approximately 50% of the time. Karen Vazquez states she is not currently exercising.  Today's visit was #: 5 Starting weight: 245 lbs Starting date: 08/20/2020 Today's weight: 230 lbs Today's date: 11/03/2020 Total lbs lost to date: 15 Total lbs lost since last in-office visit: 3  Interim History: Karen Vazquez's granddaughter and great granddaughter visited for 5 days from [place].  She ate off plan while family was visiting.  Subjective:   1. Pre-diabetes 08/20/2020 A1c 5.3- at goal!  CMP last checked November 2021.  2. Essential hypertension Karen Vazquez's SBP elevated at OV. She missed a dose of amlodipine 2.5 mg over the weekend. She denies chest pain, palpitations, or dyspnea. She uses CPAP 5 out of 7 hours last night.  3. Obstructive sleep apnea Karen Vazquez continues to increase CPAP compliance. Last night, she did put CPAP back on after using bathroom. She uses CPAP 5 out of 7 hours last night.  Assessment/Plan:   1. Pre-diabetes Karen Vazquez will continue to work on weight loss, exercise, and decreasing simple carbohydrates to help decrease the risk of diabetes. Check CMP- not fasting- BG may be elevated.  - Comprehensive metabolic panel Refill- metFORMIN (GLUCOPHAGE) 500 MG tablet; Take 1 tablet (500 mg total) by mouth daily with breakfast.  Dispense: 30 tablet; Refill: 0  2. Essential hypertension Karen Vazquez is working on healthy weight loss and exercise to improve blood pressure control. We will watch for signs of hypotension as she continues her lifestyle modifications. Continue calcium channel blocker and monitor ambulatory BP. Try to keep CPAP on all night :)   3. Obstructive sleep apnea Intensive lifestyle modifications are the first line treatment for this issue. We  discussed several lifestyle modifications today and she will continue to work on diet, exercise and weight loss efforts. We will continue to monitor. Orders and follow up as documented in patient record. Try to use CPAP all night :).  4. Obesity Current BMI 0000000  Karen Vazquez is currently in the action stage of change. As such, her goal is to continue with weight loss efforts. She has agreed to the Category 2 Plan.   Exercise goals:  Increase daily walking to 10 minutes 3 times a week.  Behavioral modification strategies: increasing lean protein intake, decreasing simple carbohydrates, no skipping meals, meal planning and cooking strategies, keeping healthy foods in the home, ways to avoid boredom eating, and planning for success.  Karen Vazquez has agreed to follow-up with our clinic in 2 weeks. She was informed of the importance of frequent follow-up visits to maximize her success with intensive lifestyle modifications for her multiple health conditions.   Objective:   Blood pressure (!) 155/78, pulse 69, temperature 97.9 F (36.6 C), height '5\' 3"'$  (1.6 m), weight 230 lb (104.3 kg), last menstrual period 05/24/2010, SpO2 97 %. Body mass index is 40.74 kg/m.  General: Cooperative, alert, well developed, in no acute distress. HEENT: Conjunctivae and lids unremarkable. Cardiovascular: Regular rhythm.  Lungs: Normal work of breathing. Neurologic: No focal deficits.   Lab Results  Component Value Date   CREATININE 0.86 11/03/2020   BUN 11 11/03/2020   NA 143 11/03/2020   K 5.0 11/03/2020   CL 103 11/03/2020   CO2 25 11/03/2020   Lab Results  Component Value Date   ALT  14 11/03/2020   AST 20 11/03/2020   ALKPHOS 117 11/03/2020   BILITOT 0.4 11/03/2020   Lab Results  Component Value Date   HGBA1C 5.3 08/20/2020   HGBA1C 5.3 07/24/2018   HGBA1C 5.4 11/28/2017   HGBA1C 5.2 07/20/2017   HGBA1C 5.4 06/12/2014   Lab Results  Component Value Date   INSULIN 19.2 08/20/2020   INSULIN 15.8  07/24/2018   INSULIN 7.8 11/28/2017   INSULIN 12.5 07/20/2017   Lab Results  Component Value Date   TSH 2.710 07/20/2020   Lab Results  Component Value Date   CHOL 180 07/20/2020   HDL 35 (L) 07/20/2020   LDLCALC 123 (H) 07/20/2020   TRIG 118 07/20/2020   CHOLHDL 5.1 (H) 07/20/2020   Lab Results  Component Value Date   VD25OH 43.5 08/20/2020   VD25OH 32.1 02/06/2020   VD25OH 34.3 11/04/2019   Lab Results  Component Value Date   WBC 5.4 07/20/2020   HGB 12.5 07/20/2020   HCT 39.2 07/20/2020   MCV 84 07/20/2020   PLT 174 07/20/2020   Lab Results  Component Value Date   IRON 86 02/06/2020   TIBC 399 02/06/2020   FERRITIN 23 07/20/2020    Obesity Behavioral Intervention:   Approximately 15 minutes were spent on the discussion below.  ASK: We discussed the diagnosis of obesity with Desirea today and Eura agreed to give [place] permission to discuss obesity behavioral modification therapy today.  ASSESS: Kirat has the diagnosis of obesity and her BMI today is 40.8. Noah is in the action stage of change.   ADVISE: Clyda was educated on the multiple health risks of obesity as well as the benefit of weight loss to improve her health. She was advised of the need for long term treatment and the importance of lifestyle modifications to improve her current health and to decrease her risk of future health problems.  AGREE: Multiple dietary modification options and treatment options were discussed and Denver agreed to follow the recommendations documented in the above note.  ARRANGE: Kaeley was educated on the importance of frequent visits to treat obesity as outlined per CMS and USPSTF guidelines and agreed to schedule her next follow up appointment today.  Attestation Statements:   Reviewed by clinician on day of visit: allergies, medications, problem list, medical history, surgical history, family history, social history, and previous encounter notes.  Coral Ceo,  CMA, am acting as transcriptionist for Mina Marble, NP.  I have reviewed the above documentation for accuracy and completeness, and I agree with the above. -  Elisha Mcgruder d. Thomasena Vandenheuvel, NP-C

## 2020-11-18 ENCOUNTER — Ambulatory Visit (INDEPENDENT_AMBULATORY_CARE_PROVIDER_SITE_OTHER): Payer: Medicare Other | Admitting: Family Medicine

## 2020-11-18 ENCOUNTER — Encounter (INDEPENDENT_AMBULATORY_CARE_PROVIDER_SITE_OTHER): Payer: Self-pay | Admitting: Family Medicine

## 2020-11-18 ENCOUNTER — Other Ambulatory Visit: Payer: Self-pay

## 2020-11-18 VITALS — BP 140/79 | HR 71 | Temp 98.3°F | Ht 63.0 in | Wt 229.0 lb

## 2020-11-18 DIAGNOSIS — I10 Essential (primary) hypertension: Secondary | ICD-10-CM

## 2020-11-18 DIAGNOSIS — R7303 Prediabetes: Secondary | ICD-10-CM

## 2020-11-18 DIAGNOSIS — Z6841 Body Mass Index (BMI) 40.0 and over, adult: Secondary | ICD-10-CM | POA: Diagnosis not present

## 2020-11-18 MED ORDER — METFORMIN HCL 500 MG PO TABS: 500.0000 mg | ORAL_TABLET | Freq: Every day | ORAL | 0 refills | Status: DC

## 2020-11-18 NOTE — Progress Notes (Signed)
Chief Complaint:   OBESITY Sheyann is here to discuss her progress with her obesity treatment plan along with follow-up of her obesity related diagnoses. Nikeeta is on the Category 2 Plan and states she is following her eating plan approximately 50-60% of the time. Luverna states she is walking for 15-20 minutes 3 times per week.  Today's visit was #: 6 Starting weight: 245 lbs Starting date: 08/20/2020 Today's weight: 229 lbs Today's date: 11/18/2020 Total lbs lost to date: 16 Total lbs lost since last in-office visit: 1  Interim History: Jamayah continues to do well with weight loss. She notes her hunger is controlled. She has eaten out a bit more but she is trying to be mindful. She has a new camper and she plans to do more traveling.  Subjective:   1. Pre-diabetes Asjah is tolerating metformin and she notes decreased polyphagia. She denies nausea or vomiting.  2. Essential hypertension Jarica's blood pressure is borderline elevated today. She continues to work on diet and weight loss, and she is on amlodipine. She denies chest pain.  Assessment/Plan:   1. Pre-diabetes Nnenna will continue with diet, exercise, and decreasing simple carbohydrates to help decrease the risk of diabetes. We will refill metformin for 1 month.  - metFORMIN (GLUCOPHAGE) 500 MG tablet; Take 1 tablet (500 mg total) by mouth daily with breakfast.  Dispense: 30 tablet; Refill: 0  2. Essential hypertension Heven will continue with diet exercise, and her medications to improve blood pressure control. We will follow up at her next office visit.  3. Obesity with current BMI [id number] Ikeisha is currently in the action stage of change. As such, her goal is to continue with weight loss efforts. She has agreed to the Category 2 Plan.   Exercise goals: As is.  Behavioral modification strategies: increasing water intake and travel eating strategies.  Darria has agreed to follow-up with our clinic in 2 weeks. She was  informed of the importance of frequent follow-up visits to maximize her success with intensive lifestyle modifications for her multiple health conditions.   Objective:   Blood pressure 140/79, pulse 71, temperature 98.3 F (36.8 C), height '5\' 3"'$  (1.6 m), weight 229 lb (103.9 kg), last menstrual period 05/24/2010, SpO2 96 %. Body mass index is 40.57 kg/m.  General: Cooperative, alert, well developed, in no acute distress. HEENT: Conjunctivae and lids unremarkable. Cardiovascular: Regular rhythm.  Lungs: Normal work of breathing. Neurologic: No focal deficits.   Lab Results  Component Value Date   CREATININE 0.86 11/03/2020   BUN 11 11/03/2020   NA 143 11/03/2020   K 5.0 11/03/2020   CL 103 11/03/2020   CO2 25 11/03/2020   Lab Results  Component Value Date   ALT 14 11/03/2020   AST 20 11/03/2020   ALKPHOS 117 11/03/2020   BILITOT 0.4 11/03/2020   Lab Results  Component Value Date   HGBA1C 5.3 08/20/2020   HGBA1C 5.3 07/24/2018   HGBA1C 5.4 11/28/2017   HGBA1C 5.2 07/20/2017   HGBA1C 5.4 06/12/2014   Lab Results  Component Value Date   INSULIN 19.2 08/20/2020   INSULIN 15.8 07/24/2018   INSULIN 7.8 11/28/2017   INSULIN 12.5 07/20/2017   Lab Results  Component Value Date   TSH 2.710 07/20/2020   Lab Results  Component Value Date   CHOL 180 07/20/2020   HDL 35 (L) 07/20/2020   LDLCALC 123 (H) 07/20/2020   TRIG 118 07/20/2020   CHOLHDL 5.1 (H) 07/20/2020   Lab  Results  Component Value Date   VD25OH 43.5 08/20/2020   VD25OH 32.1 02/06/2020   VD25OH 34.3 11/04/2019   Lab Results  Component Value Date   WBC 5.4 07/20/2020   HGB 12.5 07/20/2020   HCT 39.2 07/20/2020   MCV 84 07/20/2020   PLT 174 07/20/2020   Lab Results  Component Value Date   IRON 86 02/06/2020   TIBC 399 02/06/2020   FERRITIN 23 07/20/2020    Obesity Behavioral Intervention:   Approximately 15 minutes were spent on the discussion below.  ASK: We discussed the diagnosis of  obesity with Merelyn today and Sofie agreed to give [place] permission to discuss obesity behavioral modification therapy today.  ASSESS: Selina has the diagnosis of obesity and her BMI today is 40.6. Heavenly is in the action stage of change.   ADVISE: Kamalei was educated on the multiple health risks of obesity as well as the benefit of weight loss to improve her health. She was advised of the need for long term treatment and the importance of lifestyle modifications to improve her current health and to decrease her risk of future health problems.  AGREE: Multiple dietary modification options and treatment options were discussed and Crytal agreed to follow the recommendations documented in the above note.  ARRANGE: Hedaya was educated on the importance of frequent visits to treat obesity as outlined per CMS and USPSTF guidelines and agreed to schedule her next follow up appointment today.  Attestation Statements:   Reviewed by clinician on day of visit: allergies, medications, problem list, medical history, surgical history, family history, social history, and previous encounter notes.   I, Trixie Dredge, am acting as transcriptionist for Dennard Nip, MD.  I have reviewed the above documentation for accuracy and completeness, and I agree with the above. -  Dennard Nip, MD

## 2020-12-02 ENCOUNTER — Encounter (INDEPENDENT_AMBULATORY_CARE_PROVIDER_SITE_OTHER): Payer: Self-pay

## 2020-12-04 ENCOUNTER — Encounter (INDEPENDENT_AMBULATORY_CARE_PROVIDER_SITE_OTHER): Payer: Self-pay | Admitting: Family Medicine

## 2020-12-04 ENCOUNTER — Ambulatory Visit (INDEPENDENT_AMBULATORY_CARE_PROVIDER_SITE_OTHER): Payer: Medicare Other | Admitting: Family Medicine

## 2020-12-04 ENCOUNTER — Other Ambulatory Visit: Payer: Self-pay

## 2020-12-04 VITALS — BP 114/76 | HR 83 | Temp 98.4°F | Ht 63.0 in | Wt 226.0 lb

## 2020-12-04 DIAGNOSIS — Z6841 Body Mass Index (BMI) 40.0 and over, adult: Secondary | ICD-10-CM | POA: Diagnosis not present

## 2020-12-04 DIAGNOSIS — R7303 Prediabetes: Secondary | ICD-10-CM

## 2020-12-04 DIAGNOSIS — J3089 Other allergic rhinitis: Secondary | ICD-10-CM

## 2020-12-04 MED ORDER — METFORMIN HCL 500 MG PO TABS: 500.0000 mg | ORAL_TABLET | Freq: Every day | ORAL | 0 refills | Status: DC

## 2020-12-06 NOTE — Progress Notes (Signed)
**Note De-Identified Karen Vazquez Obfuscation** Chief Complaint:   OBESITY Karen Vazquez is here to discuss her progress with her obesity treatment plan along with follow-up of her obesity related diagnoses. Karen Vazquez is on the Category 2 Plan and states she is following her eating plan approximately 65% of the time. Karen Vazquez states she is walking for 15 minutes 4 times per week.  Today's visit was #: 7 Starting weight: 245 lbs Starting date: 08/20/2020 Today's weight: 226 lbs Today's date: 12/04/2020 Total lbs lost to date: 19 Total lbs lost since last in-office visit: 3  Interim History: Karen Vazquez continues to do well with weight loss. She has struggled to eat all of the protein on her plan.  Subjective:   1. Pre-diabetes Karen Vazquez is stable on metformin, and she denies nausea or vomiting. She is doing well on her eating plan overall.  2. Allergic rhinitis due to other allergic trigger, unspecified seasonality Karen Vazquez notes her allergies are acting up. She uses Flonase intermittently.  Assessment/Plan:   1. Pre-diabetes Karen Vazquez will continue to work on weight loss, exercise, and decreasing simple carbohydrates to help decrease the risk of diabetes. We will refill metformin for 1 month.  - metFORMIN (GLUCOPHAGE) 500 MG tablet; Take 1 tablet (500 mg total) by mouth daily with breakfast.  Dispense: 30 tablet; Refill: 0  2. Allergic rhinitis due to other allergic trigger, unspecified seasonality Karen Vazquez will continue OTC Flonase, and she is to make sure to start taking it daily to get the maximum benefit.  3. Obesity with current BMI 0000000 Karen Vazquez is currently in the action stage of change. As such, her goal is to continue with weight loss efforts. She has agreed to the Category 2 Plan.   Exercise goals: As is.  Behavioral modification strategies: increasing lean protein intake and meal planning and cooking strategies.  Karen Vazquez has agreed to follow-up with our clinic in 2 to 3 weeks. She was informed of the importance of frequent follow-up visits to  maximize her success with intensive lifestyle modifications for her multiple health conditions.   Objective:   Blood pressure 114/76, pulse 83, temperature 98.4 F (36.9 C), height '5\' 3"'$  (1.6 m), weight 226 lb (102.5 kg), last menstrual period 05/24/2010, SpO2 94 %. Body mass index is 40.03 kg/m.  General: Cooperative, alert, well developed, in no acute distress. HEENT: Conjunctivae and lids unremarkable. Cardiovascular: Regular rhythm.  Lungs: Normal work of breathing. Neurologic: No focal deficits.   Lab Results  Component Value Date   CREATININE 0.86 11/03/2020   BUN 11 11/03/2020   NA 143 11/03/2020   K 5.0 11/03/2020   CL 103 11/03/2020   CO2 25 11/03/2020   Lab Results  Component Value Date   ALT 14 11/03/2020   AST 20 11/03/2020   ALKPHOS 117 11/03/2020   BILITOT 0.4 11/03/2020   Lab Results  Component Value Date   HGBA1C 5.3 08/20/2020   HGBA1C 5.3 07/24/2018   HGBA1C 5.4 11/28/2017   HGBA1C 5.2 07/20/2017   HGBA1C 5.4 06/12/2014   Lab Results  Component Value Date   INSULIN 19.2 08/20/2020   INSULIN 15.8 07/24/2018   INSULIN 7.8 11/28/2017   INSULIN 12.5 07/20/2017   Lab Results  Component Value Date   TSH 2.710 07/20/2020   Lab Results  Component Value Date   CHOL 180 07/20/2020   HDL 35 (L) 07/20/2020   LDLCALC 123 (H) 07/20/2020   TRIG 118 07/20/2020   CHOLHDL 5.1 (H) 07/20/2020   Lab Results  Component Value Date   VD25OH  43.5 08/20/2020   VD25OH 32.1 02/06/2020   VD25OH 34.3 11/04/2019   Lab Results  Component Value Date   WBC 5.4 07/20/2020   HGB 12.5 07/20/2020   HCT 39.2 07/20/2020   MCV 84 07/20/2020   PLT 174 07/20/2020   Lab Results  Component Value Date   IRON 86 02/06/2020   TIBC 399 02/06/2020   FERRITIN 23 07/20/2020    Obesity Behavioral Intervention:   Approximately 15 minutes were spent on the discussion below.  ASK: We discussed the diagnosis of obesity with Karen Vazquez today and Karen Vazquez agreed to give [place]  permission to discuss obesity behavioral modification therapy today.  ASSESS: Karen Vazquez has the diagnosis of obesity and her BMI today is 40.1. Karen Vazquez is in the action stage of change.   ADVISE: Karen Vazquez was educated on the multiple health risks of obesity as well as the benefit of weight loss to improve her health. She was advised of the need for long term treatment and the importance of lifestyle modifications to improve her current health and to decrease her risk of future health problems.  AGREE: Multiple dietary modification options and treatment options were discussed and Karen Vazquez agreed to follow the recommendations documented in the above note.  ARRANGE: Karen Vazquez was educated on the importance of frequent visits to treat obesity as outlined per CMS and USPSTF guidelines and agreed to schedule her next follow up appointment today.  Attestation Statements:   Reviewed by clinician on day of visit: allergies, medications, problem list, medical history, surgical history, family history, social history, and previous encounter notes.   I, Trixie Dredge, am acting as transcriptionist for Dennard Nip, MD.  I have reviewed the above documentation for accuracy and completeness, and I agree with the above. -  Dennard Nip, MD

## 2020-12-17 ENCOUNTER — Encounter (INDEPENDENT_AMBULATORY_CARE_PROVIDER_SITE_OTHER): Payer: Self-pay

## 2020-12-18 ENCOUNTER — Encounter (INDEPENDENT_AMBULATORY_CARE_PROVIDER_SITE_OTHER): Payer: Self-pay | Admitting: Family Medicine

## 2020-12-18 ENCOUNTER — Other Ambulatory Visit: Payer: Self-pay

## 2020-12-18 ENCOUNTER — Ambulatory Visit (INDEPENDENT_AMBULATORY_CARE_PROVIDER_SITE_OTHER): Payer: Medicare Other | Admitting: Family Medicine

## 2020-12-18 VITALS — BP 132/75 | Temp 98.1°F | Ht 63.0 in | Wt 227.0 lb

## 2020-12-18 DIAGNOSIS — E860 Dehydration: Secondary | ICD-10-CM

## 2020-12-18 DIAGNOSIS — R7303 Prediabetes: Secondary | ICD-10-CM | POA: Diagnosis not present

## 2020-12-18 DIAGNOSIS — Z6841 Body Mass Index (BMI) 40.0 and over, adult: Secondary | ICD-10-CM | POA: Diagnosis not present

## 2020-12-18 NOTE — Progress Notes (Signed)
Chief Complaint:   OBESITY Karen Vazquez is here to discuss her progress with her obesity treatment plan along with follow-up of her obesity related diagnoses. Karen Vazquez is on the Category 2 Plan and states she is following her eating plan approximately 50% of the time. Karen Vazquez states she is walking for 15 minutes 3-4 times per week.  Today's visit was #: 8 Starting weight: 245 lbs Starting date: 08/20/2020 Today's weight: 227 lbs Today's date: 12/18/2020 Total lbs lost to date: 18 Total lbs lost since last in-office visit: 0  Interim History: Karen Vazquez has struggled more with weight loss. She has been on the road more and she has had to eat out more which likely increased her calories and decreased her protein.  Subjective:   1. Pre-diabetes Karen Vazquez is on metformin in the PM, and she still notes polyphagia. She continues to work on lifestyle interventions such as diet and exercise to help control her pre-diabetes.  2. Dehydration Karen Vazquez is struggling to increase her water intake. She is working on increasing this.  Assessment/Plan:   1. Pre-diabetes Karen Vazquez will change metformin to AM with food, and we will follow up at er next visit (no refill needed today). She will continue to work on weight loss, exercise, and decreasing simple carbohydrates to help decrease the risk of diabetes.   2. Dehydration Karen Vazquez is to increase her water intake to 80 oz per day, and add flavor or carbonation to her water.  3. Obesity with current BMI 36.1 Karen Vazquez is currently in the action stage of change. As such, her goal is to continue with weight loss efforts. She has agreed to the Category 2 Plan and keeping a food journal and adhering to recommended goals of 400-550 calories and 40+ grams of protein at supper daily.   Eating Out options handout was given.  Exercise goals: As is.  Behavioral modification strategies: increasing lean protein intake.  Karen Vazquez has agreed to follow-up with our clinic in 2 to 3 weeks. She  was informed of the importance of frequent follow-up visits to maximize her success with intensive lifestyle modifications for her multiple health conditions.   Objective:   Blood pressure 132/75, temperature 98.1 F (36.7 C), height 5\' 3"  (1.6 m), weight 227 lb (103 kg), last menstrual period 05/24/2010. Body mass index is 40.21 kg/m.  General: Cooperative, alert, well developed, in no acute distress. HEENT: Conjunctivae and lids unremarkable. Cardiovascular: Regular rhythm.  Lungs: Normal work of breathing. Neurologic: No focal deficits.   Lab Results  Component Value Date   CREATININE 0.86 11/03/2020   BUN 11 11/03/2020   NA 143 11/03/2020   K 5.0 11/03/2020   CL 103 11/03/2020   CO2 25 11/03/2020   Lab Results  Component Value Date   ALT 14 11/03/2020   AST 20 11/03/2020   ALKPHOS 117 11/03/2020   BILITOT 0.4 11/03/2020   Lab Results  Component Value Date   HGBA1C 5.3 08/20/2020   HGBA1C 5.3 07/24/2018   HGBA1C 5.4 11/28/2017   HGBA1C 5.2 07/20/2017   HGBA1C 5.4 06/12/2014   Lab Results  Component Value Date   INSULIN 19.2 08/20/2020   INSULIN 15.8 07/24/2018   INSULIN 7.8 11/28/2017   INSULIN 12.5 07/20/2017   Lab Results  Component Value Date   TSH 2.710 07/20/2020   Lab Results  Component Value Date   CHOL 180 07/20/2020   HDL 35 (L) 07/20/2020   LDLCALC 123 (H) 07/20/2020   TRIG 118 07/20/2020   CHOLHDL  5.1 (H) 07/20/2020   Lab Results  Component Value Date   VD25OH 43.5 08/20/2020   VD25OH 32.1 02/06/2020   VD25OH 34.3 11/04/2019   Lab Results  Component Value Date   WBC 5.4 07/20/2020   HGB 12.5 07/20/2020   HCT 39.2 07/20/2020   MCV 84 07/20/2020   PLT 174 07/20/2020   Lab Results  Component Value Date   IRON 86 02/06/2020   TIBC 399 02/06/2020   FERRITIN 23 07/20/2020    Obesity Behavioral Intervention:   Approximately 15 minutes were spent on the discussion below.  ASK: We discussed the diagnosis of obesity with Karen Vazquez  today and Karen Vazquez agreed to give [place] permission to discuss obesity behavioral modification therapy today.  ASSESS: Karen Vazquez has the diagnosis of obesity and her BMI today is 40.2. Karen Vazquez is in the action stage of change.   ADVISE: Karen Vazquez was educated on the multiple health risks of obesity as well as the benefit of weight loss to improve her health. She was advised of the need for long term treatment and the importance of lifestyle modifications to improve her current health and to decrease her risk of future health problems.  AGREE: Multiple dietary modification options and treatment options were discussed and Karen Vazquez agreed to follow the recommendations documented in the above note.  ARRANGE: Karen Vazquez was educated on the importance of frequent visits to treat obesity as outlined per CMS and USPSTF guidelines and agreed to schedule her next follow up appointment today.  Attestation Statements:   Reviewed by clinician on day of visit: allergies, medications, problem list, medical history, surgical history, family history, social history, and previous encounter notes.   I, Trixie Dredge, am acting as transcriptionist for Dennard Nip, MD.  I have reviewed the above documentation for accuracy and completeness, and I agree with the above. -  Dennard Nip, MD

## 2020-12-21 ENCOUNTER — Telehealth: Payer: Self-pay | Admitting: Family Medicine

## 2020-12-21 NOTE — Telephone Encounter (Signed)
That would be fine 

## 2020-12-21 NOTE — Telephone Encounter (Signed)
FYI  Patient is scheduled for an OV for med check/follow up on 10/19 at 11:00. She would prefer a telephone visit. Her last OV was 5/22. Please advise   CB#  [phone]

## 2020-12-22 DIAGNOSIS — N9089 Other specified noninflammatory disorders of vulva and perineum: Secondary | ICD-10-CM | POA: Insufficient documentation

## 2021-01-05 ENCOUNTER — Other Ambulatory Visit: Payer: Self-pay

## 2021-01-05 ENCOUNTER — Ambulatory Visit (INDEPENDENT_AMBULATORY_CARE_PROVIDER_SITE_OTHER): Payer: Medicare Other | Admitting: Family Medicine

## 2021-01-05 ENCOUNTER — Encounter (INDEPENDENT_AMBULATORY_CARE_PROVIDER_SITE_OTHER): Payer: Self-pay | Admitting: Family Medicine

## 2021-01-05 VITALS — BP 128/82 | HR 69 | Temp 98.1°F | Ht 63.0 in | Wt 224.0 lb

## 2021-01-05 DIAGNOSIS — R7303 Prediabetes: Secondary | ICD-10-CM | POA: Diagnosis not present

## 2021-01-05 DIAGNOSIS — Z6841 Body Mass Index (BMI) 40.0 and over, adult: Secondary | ICD-10-CM

## 2021-01-05 NOTE — Progress Notes (Signed)
Chief Complaint:   OBESITY Karen Vazquez is here to discuss her progress with her obesity treatment plan along with follow-up of her obesity related diagnoses. Karen Vazquez is on the Category 2 Plan and keeping a food journal and adhering to recommended goals of 400-550 calories and 40+ grams of protein at supper daily and states she is following her eating plan approximately 65% of the time. Karen Vazquez states she is doing cardio for 15 minutes 2-3 times per week.  Today's visit was #: 9 Starting weight: 245 lbs Starting date: 08/20/2020 Today's weight: 224 lbs Today's date: 01/05/2021 Total lbs lost to date: 21 Total lbs lost since last in-office visit: 3  Interim History: Karen Vazquez continues to do well with weight loss on her eating plan. Her hunger is mostly controlled, but she hasn't been sleeping as well recently.  Subjective:   1. Pre-diabetes Karen Vazquez changed metformin to morning and she notes some mild GI upset.  Assessment/Plan:   1. Pre-diabetes Karen Vazquez is to take metformin with a full breakfast, and will continue to follow up. She will continue to work on weight loss, exercise, and decreasing simple carbohydrates to help decrease the risk of diabetes.   2. Obesity with current BMI 63.7 Karen Vazquez is currently in the action stage of change. As such, her goal is to continue with weight loss efforts. She has agreed to the Category 2 Plan and keeping a food journal and adhering to recommended goals of 400-550 calories and 40+ grams of protein at supper daily.   Exercise goals: As is.  Behavioral modification strategies: increasing lean protein intake.  Karen Vazquez has agreed to follow-up with our clinic in 2 weeks. She was informed of the importance of frequent follow-up visits to maximize her success with intensive lifestyle modifications for her multiple health conditions.   Objective:   Blood pressure 128/82, pulse 69, temperature 98.1 F (36.7 C), height 5\' 3"  (1.6 m), weight 224 lb (101.6 kg), last  menstrual period 05/24/2010, SpO2 98 %. Body mass index is 39.68 kg/m.  General: Cooperative, alert, well developed, in no acute distress. HEENT: Conjunctivae and lids unremarkable. Cardiovascular: Regular rhythm.  Lungs: Normal work of breathing. Neurologic: No focal deficits.   Lab Results  Component Value Date   CREATININE 0.86 11/03/2020   BUN 11 11/03/2020   NA 143 11/03/2020   K 5.0 11/03/2020   CL 103 11/03/2020   CO2 25 11/03/2020   Lab Results  Component Value Date   ALT 14 11/03/2020   AST 20 11/03/2020   ALKPHOS 117 11/03/2020   BILITOT 0.4 11/03/2020   Lab Results  Component Value Date   HGBA1C 5.3 08/20/2020   HGBA1C 5.3 07/24/2018   HGBA1C 5.4 11/28/2017   HGBA1C 5.2 07/20/2017   HGBA1C 5.4 06/12/2014   Lab Results  Component Value Date   INSULIN 19.2 08/20/2020   INSULIN 15.8 07/24/2018   INSULIN 7.8 11/28/2017   INSULIN 12.5 07/20/2017   Lab Results  Component Value Date   TSH 2.710 07/20/2020   Lab Results  Component Value Date   CHOL 180 07/20/2020   HDL 35 (L) 07/20/2020   LDLCALC 123 (H) 07/20/2020   TRIG 118 07/20/2020   CHOLHDL 5.1 (H) 07/20/2020   Lab Results  Component Value Date   VD25OH 43.5 08/20/2020   VD25OH 32.1 02/06/2020   VD25OH 34.3 11/04/2019   Lab Results  Component Value Date   WBC 5.4 07/20/2020   HGB 12.5 07/20/2020   HCT 39.2 07/20/2020  MCV 84 07/20/2020   PLT 174 07/20/2020   Lab Results  Component Value Date   IRON 86 02/06/2020   TIBC 399 02/06/2020   FERRITIN 23 07/20/2020   Attestation Statements:   Reviewed by clinician on day of visit: allergies, medications, problem list, medical history, surgical history, family history, social history, and previous encounter notes.  Time spent on visit including pre-visit chart review and post-visit care and charting was 20 minutes.    I, Trixie Dredge, am acting as transcriptionist for Dennard Nip, MD.  I have reviewed the above documentation for  accuracy and completeness, and I agree with the above. -  Dennard Nip, MD

## 2021-01-06 ENCOUNTER — Ambulatory Visit (INDEPENDENT_AMBULATORY_CARE_PROVIDER_SITE_OTHER): Payer: Medicare Other | Admitting: Family Medicine

## 2021-01-06 DIAGNOSIS — G4700 Insomnia, unspecified: Secondary | ICD-10-CM

## 2021-01-06 DIAGNOSIS — I10 Essential (primary) hypertension: Secondary | ICD-10-CM | POA: Diagnosis not present

## 2021-01-06 DIAGNOSIS — K219 Gastro-esophageal reflux disease without esophagitis: Secondary | ICD-10-CM | POA: Diagnosis not present

## 2021-01-06 DIAGNOSIS — Z6841 Body Mass Index (BMI) 40.0 and over, adult: Secondary | ICD-10-CM

## 2021-01-06 MED ORDER — ALBUTEROL SULFATE HFA 108 (90 BASE) MCG/ACT IN AERS: 2.0000 | INHALATION_SPRAY | Freq: Four times a day (QID) | RESPIRATORY_TRACT | 2 refills | Status: DC | PRN

## 2021-01-06 MED ORDER — ZOLPIDEM TARTRATE 5 MG PO TABS: ORAL_TABLET | ORAL | 5 refills | Status: DC

## 2021-01-06 MED ORDER — PREGABALIN 50 MG PO CAPS: ORAL_CAPSULE | ORAL | 3 refills | Status: DC

## 2021-01-06 MED ORDER — AMLODIPINE BESYLATE 2.5 MG PO TABS: 2.5000 mg | ORAL_TABLET | Freq: Every day | ORAL | 5 refills | Status: DC

## 2021-01-06 MED ORDER — PANTOPRAZOLE SODIUM 40 MG PO TBEC: 40.0000 mg | DELAYED_RELEASE_TABLET | Freq: Every day | ORAL | 5 refills | Status: DC

## 2021-01-06 MED ORDER — ARMOUR THYROID 15 MG PO TABS: ORAL_TABLET | ORAL | 5 refills | Status: DC

## 2021-01-06 MED ORDER — ALPRAZOLAM 0.5 MG PO TABS: ORAL_TABLET | ORAL | 5 refills | Status: DC

## 2021-01-06 MED ORDER — FLUTICASONE PROPIONATE 50 MCG/ACT NA SUSP: 2.0000 | Freq: Every day | NASAL | 5 refills | Status: DC

## 2021-01-06 MED ORDER — THYROID 30 MG PO TABS: ORAL_TABLET | ORAL | 5 refills | Status: DC

## 2021-01-06 NOTE — Progress Notes (Signed)
I connected with  Karen Vazquez on 00/86/76 by a phone enabled telemedicine application and verified that I am speaking with the correct person using two identifiers.   I discussed the limitations of evaluation and management by telemedicine. The patient expressed understanding and agreed to proceed.  Patient location: home  Provider location: in office  I provided 20 including discussion and documentation minutes of non face - to - face time during this encounter.   Subjective:    Patient ID: Karen Vazquez, female    DOB: [birth year], 65 y.o.   MRN: [id number]  Hypertension This is a chronic problem. Treatments tried: amlodipine.  Hypothyroidism- follow up and medication refills  Essential hypertension  Gastroesophageal reflux disease, unspecified whether esophagitis present  Insomnia, unspecified type  Class 3 severe obesity with serious comorbidity and body mass index (BMI) of 40.0 to 44.9 in adult, unspecified obesity type ([place])  She is trying to watch her diet Trying to stay active She is lost some weight States her other chronic health issues are doing well We reviewed over the medications  Review of Systems     Objective:   Physical Exam Today's visit was via telephone Physical exam was not possible for this visit        Assessment & Plan:   1. Essential hypertension Patient relates blood pressure under good control continue current medications she sees a healthy weight clinic on a regular basis  2. Gastroesophageal reflux disease, unspecified whether esophagitis present She does take her medication denies any setbacks  3. Insomnia, unspecified type She does utilize zolpidem at nighttime states it does a good job for her she would like to continue it she is aware only to use this at home when able to devote 8 hours of sleep  4. Class 3 severe obesity with serious comorbidity and body mass index (BMI) of 40.0 to 44.9 in adult, unspecified obesity type  ([place]) Continue with healthy weight clinic

## 2021-01-12 ENCOUNTER — Telehealth: Payer: Self-pay

## 2021-01-12 NOTE — Telephone Encounter (Signed)
Alprazolam 0.5 mg phoned in to [facility] , left message for a return call from patient to inform.

## 2021-01-13 ENCOUNTER — Telehealth: Payer: Self-pay | Admitting: Family Medicine

## 2021-01-13 NOTE — Telephone Encounter (Signed)
FYI Patient called and I informed her her prescription had been called in to [facility].

## 2021-01-19 ENCOUNTER — Ambulatory Visit (INDEPENDENT_AMBULATORY_CARE_PROVIDER_SITE_OTHER): Payer: Medicare Other | Admitting: Family Medicine

## 2021-01-25 ENCOUNTER — Ambulatory Visit (INDEPENDENT_AMBULATORY_CARE_PROVIDER_SITE_OTHER): Payer: Medicare Other | Admitting: Family Medicine

## 2021-01-25 ENCOUNTER — Other Ambulatory Visit: Payer: Self-pay

## 2021-01-25 ENCOUNTER — Encounter (INDEPENDENT_AMBULATORY_CARE_PROVIDER_SITE_OTHER): Payer: Self-pay | Admitting: Family Medicine

## 2021-01-25 VITALS — BP 154/80 | HR 68 | Temp 98.7°F | Ht 63.0 in | Wt 225.0 lb

## 2021-01-25 DIAGNOSIS — R7303 Prediabetes: Secondary | ICD-10-CM | POA: Diagnosis not present

## 2021-01-25 DIAGNOSIS — Z6841 Body Mass Index (BMI) 40.0 and over, adult: Secondary | ICD-10-CM | POA: Diagnosis not present

## 2021-01-25 MED ORDER — METFORMIN HCL 500 MG PO TABS: 500.0000 mg | ORAL_TABLET | Freq: Every day | ORAL | 0 refills | Status: DC

## 2021-01-25 NOTE — Progress Notes (Signed)
Chief Complaint:   OBESITY Karen Vazquez is here to discuss her progress with her obesity treatment plan along with follow-up of her obesity related diagnoses. Brandis is on the Category 2 Plan and keeping a food journal and adhering to recommended goals of 400-550 calories and 40+ grams of protein at supper daily and states she is following her eating plan approximately 50% of the time. Mario states she is walking steps 3 times per week.   Today's visit was #: 10 Starting weight: 245 lbs Starting date: 08/20/2020 Today's weight: 225 lbs Today's date: 01/25/2021 Total lbs lost to date: 20 Total lbs lost since last in-office visit: 0  Interim History: Laiba has had a lot of stressors since her last visit, and she hasn't been able to concentrate on diet and weight loss. She says things have settled down and she is ready to get back on track.  Subjective:   1. Pre-diabetes Gracelynne is stable on metformin and she is mindful of decreasing simple carbohydrates. She is staying active as well.  Assessment/Plan:   1. Pre-diabetes Latysha will continue to work on weight loss, exercise, and decreasing simple carbohydrates to help decrease the risk of diabetes. We will refill metformin for 1 month, and we will recheck labs in 1 month.  - metFORMIN (GLUCOPHAGE) 500 MG tablet; Take 1 tablet (500 mg total) by mouth daily with breakfast.  Dispense: 30 tablet; Refill: 0  2. Obesity with current BMI of 32.9 Lenyx is currently in the action stage of change. As such, her goal is to continue with weight loss efforts. She has agreed to the Category 2 Plan and keeping a food journal and adhering to recommended goals of 400-550 calories and 40+ grams of protein at supper daily.   Exercise goals: As is.  Behavioral modification strategies: holiday eating strategies .  Stephania has agreed to follow-up with our clinic in 2 to 3 weeks. She was informed of the importance of frequent follow-up visits to maximize her success  with intensive lifestyle modifications for her multiple health conditions.   Objective:   Blood pressure (!) 154/80, pulse 68, temperature 98.7 F (37.1 C), height 5\' 3"  (1.6 m), weight 225 lb (102.1 kg), last menstrual period 05/24/2010, SpO2 97 %. Body mass index is 39.86 kg/m.  General: Cooperative, alert, well developed, in no acute distress. HEENT: Conjunctivae and lids unremarkable. Cardiovascular: Regular rhythm.  Lungs: Normal work of breathing. Neurologic: No focal deficits.   Lab Results  Component Value Date   CREATININE 0.86 11/03/2020   BUN 11 11/03/2020   NA 143 11/03/2020   K 5.0 11/03/2020   CL 103 11/03/2020   CO2 25 11/03/2020   Lab Results  Component Value Date   ALT 14 11/03/2020   AST 20 11/03/2020   ALKPHOS 117 11/03/2020   BILITOT 0.4 11/03/2020   Lab Results  Component Value Date   HGBA1C 5.3 08/20/2020   HGBA1C 5.3 07/24/2018   HGBA1C 5.4 11/28/2017   HGBA1C 5.2 07/20/2017   HGBA1C 5.4 06/12/2014   Lab Results  Component Value Date   INSULIN 19.2 08/20/2020   INSULIN 15.8 07/24/2018   INSULIN 7.8 11/28/2017   INSULIN 12.5 07/20/2017   Lab Results  Component Value Date   TSH 2.710 07/20/2020   Lab Results  Component Value Date   CHOL 180 07/20/2020   HDL 35 (L) 07/20/2020   LDLCALC 123 (H) 07/20/2020   TRIG 118 07/20/2020   CHOLHDL 5.1 (H) 07/20/2020   Lab  Results  Component Value Date   VD25OH 43.5 08/20/2020   VD25OH 32.1 02/06/2020   VD25OH 34.3 11/04/2019   Lab Results  Component Value Date   WBC 5.4 07/20/2020   HGB 12.5 07/20/2020   HCT 39.2 07/20/2020   MCV 84 07/20/2020   PLT 174 07/20/2020   Lab Results  Component Value Date   IRON 86 02/06/2020   TIBC 399 02/06/2020   FERRITIN 23 07/20/2020    Obesity Behavioral Intervention:   Approximately 15 minutes were spent on the discussion below.  ASK: We discussed the diagnosis of obesity with [place] today and Shamira agreed to give [place] permission to discuss  obesity behavioral modification therapy today.  ASSESS: Mylene has the diagnosis of obesity and her BMI today is 39.9. Jamea is in the action stage of change.   ADVISE: Ariadne was educated on the multiple health risks of obesity as well as the benefit of weight loss to improve her health. She was advised of the need for long term treatment and the importance of lifestyle modifications to improve her current health and to decrease her risk of future health problems.  AGREE: Multiple dietary modification options and treatment options were discussed and Paige agreed to follow the recommendations documented in the above note.  ARRANGE: Myleigh was educated on the importance of frequent visits to treat obesity as outlined per CMS and USPSTF guidelines and agreed to schedule her next follow up appointment today.  Attestation Statements:   Reviewed by clinician on day of visit: allergies, medications, problem list, medical history, surgical history, family history, social history, and previous encounter notes.   I, Trixie Dredge, am acting as transcriptionist for Dennard Nip, MD.  I have reviewed the above documentation for accuracy and completeness, and I agree with the above. -  Dennard Nip, MD

## 2021-02-08 ENCOUNTER — Encounter (INDEPENDENT_AMBULATORY_CARE_PROVIDER_SITE_OTHER): Payer: Self-pay | Admitting: Family Medicine

## 2021-02-08 ENCOUNTER — Other Ambulatory Visit: Payer: Self-pay

## 2021-02-08 ENCOUNTER — Ambulatory Visit (INDEPENDENT_AMBULATORY_CARE_PROVIDER_SITE_OTHER): Payer: Medicare Other | Admitting: Family Medicine

## 2021-02-08 VITALS — BP 136/83 | HR 83 | Temp 98.1°F | Ht 63.0 in | Wt 222.0 lb

## 2021-02-08 DIAGNOSIS — Z6841 Body Mass Index (BMI) 40.0 and over, adult: Secondary | ICD-10-CM

## 2021-02-08 DIAGNOSIS — R7303 Prediabetes: Secondary | ICD-10-CM

## 2021-02-08 NOTE — Progress Notes (Signed)
Chief Complaint:   OBESITY Karen Vazquez is here to discuss her progress with her obesity treatment plan along with follow-up of her obesity related diagnoses. Karen Vazquez is on the Category 1 Plan and Category 2 Plan and keeping a food journal and adhering to recommended goals of 400-550 calories and 40 grams of protein and states she is following her eating plan approximately 60% of the time. Karen Vazquez states she is cleaning the house daily all day 2-3 times per week.  Today's visit was #: 11 Starting weight: 245 lbs Starting date: 08/20/2020 Today's weight: 222 lbs Today's date: 02/08/2021 Total lbs lost to date: 23 lbs Total lbs lost since last in-office visit: 3 lbs  Interim History: Karen Vazquez alternates both Category 1 and 2. She has really worked on adhering to plan better the past few weeks and she has lost 3 lbs today. She sometimes skips lunch. She generally cooks supper in the evenings and does usually have some bread. She is working on drinking more water.  She is very active in the course of her day but does not formal exercise. She realizes she could lose weight moe quickly with closer adherance to the plan but is happy with how she is eating and her current rate of weight loss.   Subjective:   1. Pre-diabetes Karen Vazquez is on Metformin daily. Her appetite is satisfied. She denies food cravings.   Lab Results  Component Value Date   HGBA1C 5.3 08/20/2020   Lab Results  Component Value Date   INSULIN 19.2 08/20/2020   INSULIN 15.8 07/24/2018   INSULIN 7.8 11/28/2017   INSULIN 12.5 07/20/2017    Assessment/Plan:   1. Pre-diabetes Karen Vazquez will continue Metformin 500 mg daily.   2. Obesity with current BMI of 78.24 Karen Vazquez is currently in the action stage of change. As such, her goal is to continue with weight loss efforts. She has agreed to the Category 2 Plan and keeping a food journal and adhering to recommended goals of 400-550 calories and 40 grams of protein at supper.  Exercise  goals: Discusses importance of cardio exercise and she she will consider starting.   Behavioral modification strategies: increasing lean protein intake, no skipping meals, and holiday eating strategies .  Karen Vazquez has agreed to follow-up with our clinic in 2 weeks with Dr. Leafy Ro.  Objective:   Blood pressure 136/83, pulse 83, temperature 98.1 F (36.7 C), height 5\' 3"  (1.6 m), weight 222 lb (100.7 kg), last menstrual period 05/24/2010, SpO2 98 %. Body mass index is 39.33 kg/m.  General: Cooperative, alert, well developed, in no acute distress. HEENT: Conjunctivae and lids unremarkable. Cardiovascular: Regular rhythm.  Lungs: Normal work of breathing. Neurologic: No focal deficits.   Lab Results  Component Value Date   CREATININE 0.86 11/03/2020   BUN 11 11/03/2020   NA 143 11/03/2020   K 5.0 11/03/2020   CL 103 11/03/2020   CO2 25 11/03/2020   Lab Results  Component Value Date   ALT 14 11/03/2020   AST 20 11/03/2020   ALKPHOS 117 11/03/2020   BILITOT 0.4 11/03/2020   Lab Results  Component Value Date   HGBA1C 5.3 08/20/2020   HGBA1C 5.3 07/24/2018   HGBA1C 5.4 11/28/2017   HGBA1C 5.2 07/20/2017   HGBA1C 5.4 06/12/2014   Lab Results  Component Value Date   INSULIN 19.2 08/20/2020   INSULIN 15.8 07/24/2018   INSULIN 7.8 11/28/2017   INSULIN 12.5 07/20/2017   Lab Results  Component Value Date  TSH 2.710 07/20/2020   Lab Results  Component Value Date   CHOL 180 07/20/2020   HDL 35 (L) 07/20/2020   LDLCALC 123 (H) 07/20/2020   TRIG 118 07/20/2020   CHOLHDL 5.1 (H) 07/20/2020   Lab Results  Component Value Date   VD25OH 43.5 08/20/2020   VD25OH 32.1 02/06/2020   VD25OH 34.3 11/04/2019   Lab Results  Component Value Date   WBC 5.4 07/20/2020   HGB 12.5 07/20/2020   HCT 39.2 07/20/2020   MCV 84 07/20/2020   PLT 174 07/20/2020   Lab Results  Component Value Date   IRON 86 02/06/2020   TIBC 399 02/06/2020   FERRITIN 23 07/20/2020   Attestation  Statements:   Reviewed by clinician on day of visit: allergies, medications, problem list, medical history, surgical history, family history, social history, and previous encounter notes.  I, Lizbeth Bark, RMA, am acting as Location manager for [employer], [place].   I have reviewed the above documentation for accuracy and completeness, and I agree with the above. -  Georgianne Fick, FNP

## 2021-02-09 ENCOUNTER — Encounter (INDEPENDENT_AMBULATORY_CARE_PROVIDER_SITE_OTHER): Payer: Self-pay | Admitting: Family Medicine

## 2021-02-24 ENCOUNTER — Ambulatory Visit (INDEPENDENT_AMBULATORY_CARE_PROVIDER_SITE_OTHER): Payer: Medicare Other | Admitting: Family Medicine

## 2021-02-24 ENCOUNTER — Encounter (INDEPENDENT_AMBULATORY_CARE_PROVIDER_SITE_OTHER): Payer: Self-pay | Admitting: Family Medicine

## 2021-02-24 ENCOUNTER — Other Ambulatory Visit: Payer: Self-pay

## 2021-02-24 VITALS — BP 130/82 | HR 70 | Temp 97.5°F | Ht 63.0 in | Wt 222.0 lb

## 2021-02-24 DIAGNOSIS — D509 Iron deficiency anemia, unspecified: Secondary | ICD-10-CM | POA: Diagnosis not present

## 2021-02-24 DIAGNOSIS — R7303 Prediabetes: Secondary | ICD-10-CM

## 2021-02-24 DIAGNOSIS — Z6841 Body Mass Index (BMI) 40.0 and over, adult: Secondary | ICD-10-CM

## 2021-02-24 DIAGNOSIS — R6889 Other general symptoms and signs: Secondary | ICD-10-CM

## 2021-02-24 DIAGNOSIS — E7849 Other hyperlipidemia: Secondary | ICD-10-CM

## 2021-02-24 DIAGNOSIS — E559 Vitamin D deficiency, unspecified: Secondary | ICD-10-CM | POA: Diagnosis not present

## 2021-02-24 DIAGNOSIS — E039 Hypothyroidism, unspecified: Secondary | ICD-10-CM

## 2021-02-24 MED ORDER — METFORMIN HCL 500 MG PO TABS: 500.0000 mg | ORAL_TABLET | Freq: Every day | ORAL | 0 refills | Status: DC

## 2021-02-24 NOTE — Progress Notes (Signed)
Chief Complaint:   OBESITY Karen Vazquez is here to discuss her progress with her obesity treatment plan along with follow-up of her obesity related diagnoses. Karen Vazquez is on the Category 1 Plan or the Category 2 Plan and states she is following her eating plan approximately 65% of the time. Gaby states she is walking for 60 minutes 5 times per week.  Today's visit was #: 12 Starting weight: 245 lbs Starting date: 08/20/2020 Today's weight: 222 lbs Today's date: 02/24/2021 Total lbs lost to date: 23 lbs Total lbs lost since last in-office visit: 0  Interim History: Karen Vazquez maintained her weight since last OV. She was off plan over Thanksgiving. She also traveled recently which affected adherence to plan. She plans to work on better adherence to plan. She is walking consistently.   Subjective:   1. Pre-diabetes Karen Vazquez's last F0Y was within normal limits. She is on Metformin currently.  Lab Results  Component Value Date   HGBA1C 5.3 08/20/2020   Lab Results  Component Value Date   INSULIN 19.2 08/20/2020   INSULIN 15.8 07/24/2018   INSULIN 7.8 11/28/2017   INSULIN 12.5 07/20/2017    2. Vitamin D deficiency Karen Vazquez's Vitamin D is slightly low at 43.5. She is OTC Vitamin D 2000 IU daily.  Lab Results  Component Value Date   VD25OH 43.5 08/20/2020   VD25OH 32.1 02/06/2020   VD25OH 34.3 11/04/2019    3. Other hyperlipidemia Karen Vazquez's LDL is above goal at 123. Her HDL was low at 35. Her Triglycerides were within normal limits. She is currently not on statin.   Lab Results  Component Value Date   ALT 14 11/03/2020   AST 20 11/03/2020   ALKPHOS 117 11/03/2020   BILITOT 0.4 11/03/2020   Lab Results  Component Value Date   CHOL 180 07/20/2020   HDL 35 (L) 07/20/2020   LDLCALC 123 (H) 07/20/2020   TRIG 118 07/20/2020   CHOLHDL 5.1 (H) 07/20/2020    4. Chronic iron deficiency anemia Karen Vazquez's last CBC was within normal limits. She has a history of Iron deficiency anemia. She takes  2 Flintstones with iron multivitamins daily.   CBC Latest Ref Rng & Units 07/20/2020 02/24/2020 11/04/2019  WBC 3.4 - 10.8 x10E3/uL 5.4 6.8 7.0  Hemoglobin 11.1 - 15.9 g/dL 12.5 13.6 13.6  Hematocrit 34.0 - 46.6 % 39.2 42.6 42.4  Platelets 150 - 450 x10E3/uL 174 188 205   Lab Results  Component Value Date   IRON 86 02/06/2020   TIBC 399 02/06/2020   FERRITIN 23 07/20/2020   Lab Results  Component Value Date   VITAMINB12 267 05/14/2019     5. Hypothyroidism, unspecified type Karen Vazquez's hypothyroidism is stable on 45 mg Armour thyroid daily. She denies palpitations.  Her last TSH was within normal limits at 2.7 on 07-20-2020.  Lab Results  Component Value Date   TSH 2.710 07/20/2020      Assessment/Plan:   1. Pre-diabetes We will refill Metformin 500 mg with breakfast. We will check A1C and fasting insulin today.  - metFORMIN (GLUCOPHAGE) 500 MG tablet; Take 1 tablet (500 mg total) by mouth daily with breakfast.  Dispense: 30 tablet; Refill: 0 - Comprehensive metabolic panel - Hemoglobin A1c - Insulin, random  2. Vitamin D deficiency  We will check Vitamin D today. Karen Vazquez agrees to continue to take OTC Vitamin D 2,000 IU daily and she will follow-up for routine testing of Vitamin D, at least 2-3 times per year to avoid over-replacement.  -  VITAMIN D 25 Hydroxy (Vit-D Deficiency, Fractures)  3. Other hyperlipidemia . We will check FLP today.  - Lipid Panel With LDL/HDL Ratio  4. Chronic iron deficiency anemia We will check labs today.  - Vitamin B12 - Folate - Iron and TIBC - Ferritin - CBC with Differential/Platelet  5. Hypothyroidism, unspecified type We will check thyroid panel today. - T3 - T4, free - TSH  6. Other general symptoms and signs We will check labs today.  - Vitamin B12 - Folate  7. Obesity with current BMI of 20.25 Karen Vazquez is currently in the action stage of change. As such, her goal is to continue with weight loss efforts. She has agreed to the  Category 1 Plan or the Category 2 Plan.   Exercise goals:  As is.  Behavioral modification strategies: increasing lean protein intake and decreasing simple carbohydrates.  Karen Vazquez has agreed to follow-up with our clinic in 2 weeks with Dr. Leafy Ro.   Objective:   Blood pressure 130/82, pulse 70, temperature (!) 97.5 F (36.4 C), height 5\' 3"  (1.6 m), weight 222 lb (100.7 kg), last menstrual period 05/24/2010, SpO2 98 %. Body mass index is 39.33 kg/m.  General: Cooperative, alert, well developed, in no acute distress. HEENT: Conjunctivae and lids unremarkable. Cardiovascular: Regular rhythm.  Lungs: Normal work of breathing. Neurologic: No focal deficits.   Lab Results  Component Value Date   CREATININE 0.86 11/03/2020   BUN 11 11/03/2020   NA 143 11/03/2020   K 5.0 11/03/2020   CL 103 11/03/2020   CO2 25 11/03/2020   Lab Results  Component Value Date   ALT 14 11/03/2020   AST 20 11/03/2020   ALKPHOS 117 11/03/2020   BILITOT 0.4 11/03/2020   Lab Results  Component Value Date   HGBA1C 5.3 08/20/2020   HGBA1C 5.3 07/24/2018   HGBA1C 5.4 11/28/2017   HGBA1C 5.2 07/20/2017   HGBA1C 5.4 06/12/2014   Lab Results  Component Value Date   INSULIN 19.2 08/20/2020   INSULIN 15.8 07/24/2018   INSULIN 7.8 11/28/2017   INSULIN 12.5 07/20/2017   Lab Results  Component Value Date   TSH 2.710 07/20/2020   Lab Results  Component Value Date   CHOL 180 07/20/2020   HDL 35 (L) 07/20/2020   LDLCALC 123 (H) 07/20/2020   TRIG 118 07/20/2020   CHOLHDL 5.1 (H) 07/20/2020   Lab Results  Component Value Date   VD25OH 43.5 08/20/2020   VD25OH 32.1 02/06/2020   VD25OH 34.3 11/04/2019   Lab Results  Component Value Date   WBC 5.4 07/20/2020   HGB 12.5 07/20/2020   HCT 39.2 07/20/2020   MCV 84 07/20/2020   PLT 174 07/20/2020   Lab Results  Component Value Date   IRON 86 02/06/2020   TIBC 399 02/06/2020   FERRITIN 23 07/20/2020   Attestation Statements:   Reviewed by  clinician on day of visit: allergies, medications, problem list, medical history, surgical history, family history, social history, and previous encounter notes.  I, Karen Vazquez, RMA, am acting as Location manager for [employer], [place].   I have reviewed the above documentation for accuracy and completeness, and I agree with the above. -  Karen Fick, FNP

## 2021-02-25 LAB — VITAMIN B12: Vitamin B-12: 1166 pg/mL (ref 232–1245)

## 2021-02-25 LAB — COMPREHENSIVE METABOLIC PANEL
ALT: 18 IU/L (ref 0–32)
AST: 19 IU/L (ref 0–40)
Albumin/Globulin Ratio: 2.1 (ref 1.2–2.2)
Albumin: 4.6 g/dL (ref 3.8–4.8)
Alkaline Phosphatase: 116 IU/L (ref 44–121)
BUN/Creatinine Ratio: 14 (ref 12–28)
BUN: 12 mg/dL (ref 8–27)
Bilirubin Total: 0.5 mg/dL (ref 0.0–1.2)
CO2: 28 mmol/L (ref 20–29)
Calcium: 9.9 mg/dL (ref 8.7–10.3)
Chloride: 101 mmol/L (ref 96–106)
Creatinine, Ser: 0.83 mg/dL (ref 0.57–1.00)
Globulin, Total: 2.2 g/dL (ref 1.5–4.5)
Glucose: 79 mg/dL (ref 70–99)
Potassium: 4.8 mmol/L (ref 3.5–5.2)
Sodium: 143 mmol/L (ref 134–144)
Total Protein: 6.8 g/dL (ref 6.0–8.5)
eGFR: 78 mL/min/{1.73_m2} (ref >59)

## 2021-02-25 LAB — CBC WITH DIFFERENTIAL/PLATELET
Basophils Absolute: 0 10*3/uL (ref 0.0–0.2)
Basos: 0 %
EOS (ABSOLUTE): 0.1 10*3/uL (ref 0.0–0.4)
Eos: 2 %
Hematocrit: 43 % (ref 34.0–46.6)
Hemoglobin: 14.2 g/dL (ref 11.1–15.9)
Immature Grans (Abs): 0 10*3/uL (ref 0.0–0.1)
Immature Granulocytes: 0 %
Lymphocytes Absolute: 1.8 10*3/uL (ref 0.7–3.1)
Lymphs: 35 %
MCH: 27.3 pg (ref 26.6–33.0)
MCHC: 33 g/dL (ref 31.5–35.7)
MCV: 83 fL (ref 79–97)
Monocytes Absolute: 0.3 10*3/uL (ref 0.1–0.9)
Monocytes: 6 %
Neutrophils Absolute: 3 10*3/uL (ref 1.4–7.0)
Neutrophils: 57 %
Platelets: 156 10*3/uL (ref 150–450)
RBC: 5.21 x10E6/uL (ref 3.77–5.28)
RDW: 14.2 % (ref 11.7–15.4)
WBC: 5.2 10*3/uL (ref 3.4–10.8)

## 2021-02-25 LAB — HEMOGLOBIN A1C
Est. average glucose Bld gHb Est-mCnc: 105 mg/dL
Hgb A1c MFr Bld: 5.3 % (ref 4.8–5.6)

## 2021-02-25 LAB — IRON AND TIBC
Iron Saturation: 25 % (ref 15–55)
Iron: 102 ug/dL (ref 27–139)
Total Iron Binding Capacity: 405 ug/dL (ref 250–450)
UIBC: 303 ug/dL (ref 118–369)

## 2021-02-25 LAB — TSH: TSH: 1.99 u[IU]/mL (ref 0.450–4.500)

## 2021-02-25 LAB — LIPID PANEL WITH LDL/HDL RATIO
Cholesterol, Total: 180 mg/dL (ref 100–199)
HDL: 37 mg/dL — ABNORMAL LOW (ref >39)
LDL Chol Calc (NIH): 121 mg/dL — ABNORMAL HIGH (ref 0–99)
LDL/HDL Ratio: 3.3 ratio — ABNORMAL HIGH (ref 0.0–3.2)
Triglycerides: 121 mg/dL (ref 0–149)
VLDL Cholesterol Cal: 22 mg/dL (ref 5–40)

## 2021-02-25 LAB — T3: T3, Total: 118 ng/dL (ref 71–180)

## 2021-02-25 LAB — T4, FREE: Free T4: 1.17 ng/dL (ref 0.82–1.77)

## 2021-02-25 LAB — INSULIN, RANDOM: INSULIN: 10.1 u[IU]/mL (ref 2.6–24.9)

## 2021-02-25 LAB — FOLATE: Folate: 6.2 ng/mL (ref >3.0)

## 2021-02-25 LAB — FERRITIN: Ferritin: 32 ng/mL (ref 15–150)

## 2021-02-25 LAB — VITAMIN D 25 HYDROXY (VIT D DEFICIENCY, FRACTURES): Vit D, 25-Hydroxy: 43.9 ng/mL (ref 30.0–100.0)

## 2021-03-10 ENCOUNTER — Ambulatory Visit (INDEPENDENT_AMBULATORY_CARE_PROVIDER_SITE_OTHER): Payer: Medicare Other | Admitting: Family Medicine

## 2021-03-22 ENCOUNTER — Encounter: Payer: Self-pay | Admitting: Family Medicine

## 2021-03-23 NOTE — Telephone Encounter (Signed)
Nurses more than likely this is a viral process I would give it some time to play out.  If Karen Vazquez feels this is getting worse I would recommend a follow-up office visit thank you

## 2021-04-01 ENCOUNTER — Other Ambulatory Visit: Payer: Self-pay

## 2021-04-01 ENCOUNTER — Encounter (INDEPENDENT_AMBULATORY_CARE_PROVIDER_SITE_OTHER): Payer: Self-pay | Admitting: Family Medicine

## 2021-04-01 ENCOUNTER — Ambulatory Visit (INDEPENDENT_AMBULATORY_CARE_PROVIDER_SITE_OTHER): Payer: Medicare Other | Admitting: Family Medicine

## 2021-04-01 VITALS — BP 118/79 | HR 68 | Temp 97.7°F | Ht 63.0 in | Wt 222.0 lb

## 2021-04-01 DIAGNOSIS — R7303 Prediabetes: Secondary | ICD-10-CM | POA: Diagnosis not present

## 2021-04-01 DIAGNOSIS — Z6839 Body mass index (BMI) 39.0-39.9, adult: Secondary | ICD-10-CM

## 2021-04-01 DIAGNOSIS — I10 Essential (primary) hypertension: Secondary | ICD-10-CM | POA: Diagnosis not present

## 2021-04-01 DIAGNOSIS — F509 Eating disorder, unspecified: Secondary | ICD-10-CM | POA: Diagnosis not present

## 2021-04-01 MED ORDER — METFORMIN HCL 500 MG PO TABS: 500.0000 mg | ORAL_TABLET | Freq: Every day | ORAL | 0 refills | Status: DC

## 2021-04-01 NOTE — Progress Notes (Signed)
Chief Complaint:   OBESITY Karen Vazquez is here to discuss her progress with her obesity treatment plan along with follow-up of her obesity related diagnoses. Karen Vazquez is on the Category 1 Plan and the Category 2 Plan or both and states she is following her eating plan approximately 50% of the time. Karen Vazquez states she is walking stairs for 10-15 minutes 7 times per week.  Today's visit was #: 13 Starting weight: 245 lbs Starting date: 08/20/2020 Today's weight: 222 lbs Today's date: 04/01/2021 Total lbs lost to date: 23 lbs Total lbs lost since last in-office visit: 0  Interim History: Karen Vazquez has maintained her weight over the holidays. She notes she skips breakfast a few days per week. She is mostly following Category 2.   Subjective:   1. Essential hypertension Karen Vazquez's 1st blood pressure was elevated today and the 2nd blood pressure was 118/79. She is on amlodipine.   BP Readings from Last 3 Encounters:  04/01/21 118/79  02/24/21 130/82  02/08/21 136/83    2. Pre-diabetes Karen Vazquez's D9I is 5.3. She has been as high as 5.8 in the past. She is on Metformin. She denies polyphagia.   Lab Results  Component Value Date   HGBA1C 5.3 02/24/2021   Lab Results  Component Value Date   INSULIN 10.1 02/24/2021   INSULIN 19.2 08/20/2020   INSULIN 15.8 07/24/2018   INSULIN 7.8 11/28/2017   INSULIN 12.5 07/20/2017    3. Emotional Eating Karen Vazquez notes stress eating recently since her husband was diagnosed with a lung lesion. They are awaiting pathology results. She finds she eats when she is stressed.   Assessment/Plan:   1. Essential hypertension Karen Vazquez will continue amlodipine.   2. Pre-diabetes Karen Vazquez will continue Metformin 500 mg every morning.  - metFORMIN (GLUCOPHAGE) 500 MG tablet; Take 1 tablet (500 mg total) by mouth daily with breakfast.  Dispense: 30 tablet; Refill: 0  3. Emotional Eating Karen Vazquez declined referral to Dr. Mallie Mussel. Discussed bupropion briefly but will defer for now.    4. Obesity with current BMI of 33.82 Karen Vazquez is currently in the action stage of change. As such, her goal is to continue with weight loss efforts. She has agreed to the Category 1 Plan and the Category 2 Plan.   Karen Vazquez will try to get in breakfast calories/protein later in day if she skips breakfast.   Exercise goals:  As is.  Behavioral modification strategies: increasing lean protein intake and no skipping meals.  Karen Vazquez has agreed to follow-up with our clinic in 3 weeks.  Objective:   Blood pressure 118/79, pulse 68, temperature 97.7 F (36.5 C), height 5\' 3"  (1.6 m), weight 222 lb (100.7 kg), last menstrual period 05/24/2010, SpO2 98 %. Body mass index is 39.33 kg/m.  General: Cooperative, alert, well developed, in no acute distress. HEENT: Conjunctivae and lids unremarkable. Cardiovascular: Regular rhythm.  Lungs: Normal work of breathing. Neurologic: No focal deficits.   Lab Results  Component Value Date   CREATININE 0.83 02/24/2021   BUN 12 02/24/2021   NA 143 02/24/2021   K 4.8 02/24/2021   CL 101 02/24/2021   CO2 28 02/24/2021   Lab Results  Component Value Date   ALT 18 02/24/2021   AST 19 02/24/2021   ALKPHOS 116 02/24/2021   BILITOT 0.5 02/24/2021   Lab Results  Component Value Date   HGBA1C 5.3 02/24/2021   HGBA1C 5.3 08/20/2020   HGBA1C 5.3 07/24/2018   HGBA1C 5.4 11/28/2017   HGBA1C 5.2 07/20/2017  Lab Results  Component Value Date   INSULIN 10.1 02/24/2021   INSULIN 19.2 08/20/2020   INSULIN 15.8 07/24/2018   INSULIN 7.8 11/28/2017   INSULIN 12.5 07/20/2017   Lab Results  Component Value Date   TSH 1.990 02/24/2021   Lab Results  Component Value Date   CHOL 180 02/24/2021   HDL 37 (L) 02/24/2021   LDLCALC 121 (H) 02/24/2021   TRIG 121 02/24/2021   CHOLHDL 5.1 (H) 07/20/2020   Lab Results  Component Value Date   VD25OH 43.9 02/24/2021   VD25OH 43.5 08/20/2020   VD25OH 32.1 02/06/2020   Lab Results  Component Value Date   WBC  5.2 02/24/2021   HGB 14.2 02/24/2021   HCT 43.0 02/24/2021   MCV 83 02/24/2021   PLT 156 02/24/2021   Lab Results  Component Value Date   IRON 102 02/24/2021   TIBC 405 02/24/2021   FERRITIN 32 02/24/2021   Attestation Statements:   Reviewed by clinician on day of visit: allergies, medications, problem list, medical history, surgical history, family history, social history, and previous encounter notes.  I, Lizbeth Bark, RMA, am acting as Location manager for [employer], [place].  I have reviewed the above documentation for accuracy and completeness, and I agree with the above. -  Georgianne Fick, FNP

## 2021-04-21 ENCOUNTER — Encounter (INDEPENDENT_AMBULATORY_CARE_PROVIDER_SITE_OTHER): Payer: Self-pay | Admitting: Family Medicine

## 2021-04-21 ENCOUNTER — Other Ambulatory Visit: Payer: Self-pay

## 2021-04-21 ENCOUNTER — Ambulatory Visit (INDEPENDENT_AMBULATORY_CARE_PROVIDER_SITE_OTHER): Payer: Medicare Other | Admitting: Family Medicine

## 2021-04-21 VITALS — BP 133/78 | HR 78 | Temp 98.3°F | Ht 63.0 in | Wt 222.0 lb

## 2021-04-21 DIAGNOSIS — G4733 Obstructive sleep apnea (adult) (pediatric): Secondary | ICD-10-CM | POA: Diagnosis not present

## 2021-04-21 DIAGNOSIS — R7303 Prediabetes: Secondary | ICD-10-CM

## 2021-04-21 DIAGNOSIS — Z6841 Body Mass Index (BMI) 40.0 and over, adult: Secondary | ICD-10-CM

## 2021-04-21 DIAGNOSIS — Z6839 Body mass index (BMI) 39.0-39.9, adult: Secondary | ICD-10-CM

## 2021-04-21 DIAGNOSIS — E669 Obesity, unspecified: Secondary | ICD-10-CM | POA: Diagnosis not present

## 2021-04-21 MED ORDER — METFORMIN HCL 500 MG PO TABS: 500.0000 mg | ORAL_TABLET | Freq: Every day | ORAL | 0 refills | Status: DC

## 2021-04-21 NOTE — Progress Notes (Signed)
Chief Complaint:   OBESITY Karen Vazquez is here to discuss her progress with her obesity treatment plan along with follow-up of her obesity related diagnoses. Sister is on the Category 1 Plan and the Category 2 Plan and states she is following her eating plan approximately 50% of the time. Karen Vazquez states she is walking stairs for 60 minutes 5 times per week.  Today's visit was #: 14 Starting weight: 245 lbs Starting date: 08/20/2020 Today's weight: 222 lbs Today's date: 04/21/2021 Total lbs lost to date: 23 lbs Total lbs lost since last in-office visit: maintained  Interim History: Karen Vazquez feels she is "off the bandwagon" as far as the plan. She feels she is on plan with breakfast and lunch. However,  she does not eat 4 ounces of meat at lunch. Her family eats out a lot at dinner but she tries to make good choices.   Subjective:   1. Pre-diabetes/insulin resistance Karen Vazquez denies polyphagia. She does note increased snacking at night. She is tolerating Metformin well. She takes it with breakfast.   Lab Results  Component Value Date   HGBA1C 5.3 02/24/2021   Lab Results  Component Value Date   INSULIN 10.1 02/24/2021   INSULIN 19.2 08/20/2020   INSULIN 15.8 07/24/2018   INSULIN 7.8 11/28/2017   INSULIN 12.5 07/20/2017    2. Obstructive sleep apnea Karen Vazquez tries to use her CPAP nightly but is only able to use it 2-3 hours. She feels much more tired when she does not use it. She has not seen a provider in years.  Assessment/Plan:   1. Pre-diabetes/insulin resistance We will refill Metformin 500 mg and she will take it at supper. We discussed GLP-1 briefly. She may consider in future.   - metFORMIN (GLUCOPHAGE) 500 MG tablet; Take 1 tablet (500 mg total) by mouth daily with supper.  Dispense: 30 tablet; Refill: 0  2. Obstructive sleep apnea Karen Vazquez was referred to pulmonary for evaluation.  - Ambulatory referral to Pulmonology  3. Obesity with current BMI of 24.23 Karen Vazquez is currently in  the action stage of change. As such, her goal is to continue with weight loss efforts. She has agreed to the Category 1 Plan or the Category 2 Plan.   Exercise goals:  As is.  Behavioral modification strategies: increasing water intake.  Karen Vazquez has agreed to follow-up with our clinic in 3 weeks with Dr. Owens Shark.  Objective:   Blood pressure 133/78, pulse 78, temperature 98.3 F (36.8 C), height 5\' 3"  (1.6 m), weight 222 lb (100.7 kg), last menstrual period 05/24/2010, SpO2 98 %. Body mass index is 39.33 kg/m.  General: Cooperative, alert, well developed, in no acute distress. HEENT: Conjunctivae and lids unremarkable. Cardiovascular: Regular rhythm.  Lungs: Normal work of breathing. Neurologic: No focal deficits.   Lab Results  Component Value Date   CREATININE 0.83 02/24/2021   BUN 12 02/24/2021   NA 143 02/24/2021   K 4.8 02/24/2021   CL 101 02/24/2021   CO2 28 02/24/2021   Lab Results  Component Value Date   ALT 18 02/24/2021   AST 19 02/24/2021   ALKPHOS 116 02/24/2021   BILITOT 0.5 02/24/2021   Lab Results  Component Value Date   HGBA1C 5.3 02/24/2021   HGBA1C 5.3 08/20/2020   HGBA1C 5.3 07/24/2018   HGBA1C 5.4 11/28/2017   HGBA1C 5.2 07/20/2017   Lab Results  Component Value Date   INSULIN 10.1 02/24/2021   INSULIN 19.2 08/20/2020   INSULIN 15.8 07/24/2018  INSULIN 7.8 11/28/2017   INSULIN 12.5 07/20/2017   Lab Results  Component Value Date   TSH 1.990 02/24/2021   Lab Results  Component Value Date   CHOL 180 02/24/2021   HDL 37 (L) 02/24/2021   LDLCALC 121 (H) 02/24/2021   TRIG 121 02/24/2021   CHOLHDL 5.1 (H) 07/20/2020   Lab Results  Component Value Date   VD25OH 43.9 02/24/2021   VD25OH 43.5 08/20/2020   VD25OH 32.1 02/06/2020   Lab Results  Component Value Date   WBC 5.2 02/24/2021   HGB 14.2 02/24/2021   HCT 43.0 02/24/2021   MCV 83 02/24/2021   PLT 156 02/24/2021   Lab Results  Component Value Date   IRON 102 02/24/2021    TIBC 405 02/24/2021   FERRITIN 32 02/24/2021   Attestation Statements:   Reviewed by clinician on day of visit: allergies, medications, problem list, medical history, surgical history, family history, social history, and previous encounter notes.  I, Lizbeth Bark, RMA, am acting as Location manager for [employer], [place].  I have reviewed the above documentation for accuracy and completeness, and I agree with the above. -  Georgianne Fick, FNP

## 2021-04-22 ENCOUNTER — Encounter (INDEPENDENT_AMBULATORY_CARE_PROVIDER_SITE_OTHER): Payer: Self-pay | Admitting: Family Medicine

## 2021-05-04 ENCOUNTER — Encounter: Payer: Self-pay | Admitting: Primary Care

## 2021-05-04 ENCOUNTER — Other Ambulatory Visit: Payer: Self-pay

## 2021-05-04 ENCOUNTER — Ambulatory Visit (INDEPENDENT_AMBULATORY_CARE_PROVIDER_SITE_OTHER): Payer: Medicare Other | Admitting: Primary Care

## 2021-05-04 VITALS — BP 128/74 | HR 78 | Temp 98.0°F | Ht 64.0 in | Wt 229.8 lb

## 2021-05-04 DIAGNOSIS — G4733 Obstructive sleep apnea (adult) (pediatric): Secondary | ICD-10-CM

## 2021-05-04 NOTE — Patient Instructions (Signed)
Congratulations on weight loss, continue efforts Focus on side sleeping position Do not drive if experiencing excessive daytime sleepiness  Orders: Home sleep study re: OSA  Follow-up: Virtual visit in 4-6 weeks with [facility] NP to review sleep study and treatment options    Sleep Apnea Sleep apnea affects breathing during sleep. It causes breathing to stop for 10 seconds or more, or to become shallow. People with sleep apnea usually snore loudly. It can also increase the risk of: Heart attack. Stroke. Being very overweight (obese). Diabetes. Heart failure. Irregular heartbeat. High blood pressure. The goal of treatment is to help you breathe normally again. What are the causes? The most common cause of this condition is a collapsed or blocked airway. There are three kinds of sleep apnea: Obstructive sleep apnea. This is caused by a blocked or collapsed airway. Central sleep apnea. This happens when the brain does not send the right signals to the muscles that control breathing. Mixed sleep apnea. This is a combination of obstructive and central sleep apnea. What increases the risk? Being overweight. Smoking. Having a small airway. Being older. Being female. Drinking alcohol. Taking medicines to calm yourself (sedatives or tranquilizers). Having family members with the condition. Having a tongue or tonsils that are larger than normal. What are the signs or symptoms? Trouble staying asleep. Loud snoring. Headaches in the morning. Waking up gasping. Dry mouth or sore throat in the morning. Being sleepy or tired during the day. If you are sleepy or tired during the day, you may also: Not be able to focus your mind (concentrate). Forget things. Get angry a lot and have mood swings. Feel sad (depressed). Have changes in your personality. Have less interest in sex, if you are female. Be unable to have an erection, if you are female. How is this treated?  Sleeping on your  side. Using a medicine to get rid of mucus in your nose (decongestant). Avoiding the use of alcohol, medicines to help you relax, or certain pain medicines (narcotics). Losing weight, if needed. Changing your diet. Quitting smoking. Using a machine to open your airway while you sleep, such as: An oral appliance. This is a mouthpiece that shifts your lower jaw forward. A CPAP device. This device blows air through a mask when you breathe out (exhale). An EPAP device. This has valves that you put in each nostril. A BIPAP device. This device blows air through a mask when you breathe in (inhale) and breathe out. Having surgery if other treatments do not work. Follow these instructions at home: Lifestyle Make changes that your doctor recommends. Eat a healthy diet. Lose weight if needed. Avoid alcohol, medicines to help you relax, and some pain medicines. Do not smoke or use any products that contain nicotine or tobacco. If you need help quitting, ask your doctor. General instructions Take over-the-counter and prescription medicines only as told by your doctor. If you were given a machine to use while you sleep, use it only as told by your doctor. If you are having surgery, make sure to tell your doctor you have sleep apnea. You may need to bring your device with you. Keep all follow-up visits. Contact a doctor if: The machine that you were given to use during sleep bothers you or does not seem to be working. You do not get better. You get worse. Get help right away if: Your chest hurts. You have trouble breathing in enough air. You have an uncomfortable feeling in your back, arms, or stomach.  You have trouble talking. One side of your body feels weak. A part of your face is hanging down. These symptoms may be an emergency. Get help right away. Call your local emergency services (911 in the [place] Do not wait to see if the symptoms will go away. Do not drive yourself to the  hospital. Summary This condition affects breathing during sleep. The most common cause is a collapsed or blocked airway. The goal of treatment is to help you breathe normally while you sleep. This information is not intended to replace advice given to you by your health care provider. Make sure you discuss any questions you have with your health care provider. Document Revised: 10/14/2020 Document Reviewed: 02/14/2020 Elsevier Patient Education  2022 [employer].

## 2021-05-04 NOTE — Progress Notes (Unsigned)
@Patient  ID: Karen Vazquez, female    DOB: Aug 15, 1955, 66 y.o.   MRN: [id number]  No chief complaint on file.   Referring provider: Kathyrn Drown, MD  HPI: 66 year old female, never smoked.  Past medical history significant for obstructive sleep apnea, bronchitis, hypertension, GERD.  05/04/2021 Patient presents today for sleep consult.  Uses nasal pillows She has a hard time using CPAP nightly, she does not use it very often She wakes up on average 2-3 times a night She has loud snoring She had a sleep study in 2008    Sleep questionnaire  Previous sleep study- Hx OSA, 2008 Symptoms- Loud snoring, sleep disruption  Typical bedtime- 10-11pm  Time it takes to fall asleep- 30-40 mins  Nocturnal awakenings- 3-4 times  Time out of bed- 7am  Weight changes- down 28 lbs  Epworth - 8/24    Allergies  Allergen Reactions   Lisinopril Cough    Immunization History  Administered Date(s) Administered   Influenza Split 12/12/2012   Influenza,inj,Quad PF,6+ Mos 01/02/2017, 01/01/2018   Influenza-Unspecified 12/20/2014, 12/28/2015, 01/31/2018, 12/12/2018, 01/16/2020   Moderna Sars-Covid-2 Vaccination 04/02/2019, 04/25/2019, 03/05/2020   Pneumococcal Polysaccharide-23 07/21/2020   Tdap 01/01/2018    Past Medical History:  Diagnosis Date   Anemia    Anxiety    Arthritis    Back pain    Blood transfusion 2011   at [facility]   Bulging lumbar disc    Chest pain    Depression    Dry mouth    Easy bruising    Excessive thirst    Fatigue    Frequent urination    GERD (gastroesophageal reflux disease)    Headache    Heat intolerance    Hypertension    Hypothyroidism    Joint pain    Leg pain    Muscle stiffness    Nervousness    Palpitations    Pre-diabetes    Restless leg syndrome    Shortness of breath on exertion    Sleep apnea    does not use c-pap machine   Stomach ulcer    Stress    Swelling of both lower extremities    Trouble in sleeping    Vitamin D  deficiency    Weakness     Tobacco History: Social History   Tobacco Use  Smoking Status Never  Smokeless Tobacco Never   Counseling given: Not Answered   Outpatient Medications Prior to Visit  Medication Sig Dispense Refill   albuterol (VENTOLIN HFA) 108 (90 Base) MCG/ACT inhaler Inhale 2 puffs into the lungs every 6 (six) hours as needed for wheezing or shortness of breath. 1 each 2   ALPRAZolam (XANAX) 0.5 MG tablet TAKE ONE TABLET BY MOUTH TWO TIMES DAILYAS NEEDED FOR ANXIETY OR SLEEP. 30 tablet 5   amLODipine (NORVASC) 2.5 MG tablet Take 1 tablet (2.5 mg total) by mouth daily. 30 tablet 5   ARMOUR THYROID 15 MG tablet TAKE ONE TABLET ONCE DAILY 30 tablet 5   Cholecalciferol (VITAMIN D) 50 MCG (2000 UT) CAPS Take by mouth.     fluticasone (FLONASE) 50 MCG/ACT nasal spray Place 2 sprays into both nostrils daily. 16 g 5   metFORMIN (GLUCOPHAGE) 500 MG tablet Take 1 tablet (500 mg total) by mouth daily with supper. 30 tablet 0   OVER THE COUNTER MEDICATION Vitamin b12     pantoprazole (PROTONIX) 40 MG tablet Take 1 tablet (40 mg total) by mouth daily. 30 tablet 5  Pediatric Multiple Vit-C-FA (FLINSTONES GUMMIES OMEGA-3 DHA) CHEW Chew 18 mg by mouth daily.     pregabalin (LYRICA) 50 MG capsule 1 twice daily 60 capsule 3   thyroid (ARMOUR THYROID) 30 MG tablet TAKE ONE (1) TABLET BY MOUTH EVERY DAY 30 tablet 5   zolpidem (AMBIEN) 5 MG tablet TAKE ONE TABLET (5MG  TOTAL) BY MOUTH AT BEDTIME AS NEEDED FOR SLEEP 30 tablet 5   No facility-administered medications prior to visit.    Review of Systems  Review of Systems  Constitutional: Negative.   HENT: Negative.    Respiratory: Negative.    Cardiovascular: Negative.   Psychiatric/Behavioral:  Positive for sleep disturbance.     Physical Exam  LMP 05/24/2010  Physical Exam Constitutional:      Appearance: Normal appearance. She is obese. She is not ill-appearing.  HENT:     Head: Normocephalic and atraumatic.      Mouth/Throat:     Comments: Mallampati class II Cardiovascular:     Rate and Rhythm: Normal rate and regular rhythm.  Pulmonary:     Effort: Pulmonary effort is normal.     Breath sounds: Normal breath sounds.  Musculoskeletal:        General: Normal range of motion.  Skin:    General: Skin is warm and dry.  Neurological:     General: No focal deficit present.     Mental Status: She is alert and oriented to person, place, and time. Mental status is at baseline.  Psychiatric:        Mood and Affect: Mood normal.        Behavior: Behavior normal.        Thought Content: Thought content normal.        Judgment: Judgment normal.     Lab Results:  CBC    Component Value Date/Time   WBC 5.2 02/24/2021 1018   WBC 5.7 05/16/2019 0535   RBC 5.21 02/24/2021 1018   RBC 3.74 (L) 05/16/2019 0535   HGB 14.2 02/24/2021 1018   HCT 43.0 02/24/2021 1018   PLT 156 02/24/2021 1018   MCV 83 02/24/2021 1018   MCH 27.3 02/24/2021 1018   MCH 19.0 (L) 05/16/2019 0535   MCHC 33.0 02/24/2021 1018   MCHC 27.1 (L) 05/16/2019 0535   RDW 14.2 02/24/2021 1018   LYMPHSABS 1.8 02/24/2021 1018   MONOABS 0.6 05/15/2019 1736   EOSABS 0.1 02/24/2021 1018   BASOSABS 0.0 02/24/2021 1018    BMET    Component Value Date/Time   NA 143 02/24/2021 1018   K 4.8 02/24/2021 1018   CL 101 02/24/2021 1018   CO2 28 02/24/2021 1018   GLUCOSE 79 02/24/2021 1018   GLUCOSE 110 (H) 05/15/2019 1736   BUN 12 02/24/2021 1018   CREATININE 0.83 02/24/2021 1018   CREATININE 0.89 12/12/2012 0813   CALCIUM 9.9 02/24/2021 1018   GFRNONAA 66 02/06/2020 1054   GFRAA 76 02/06/2020 1054    BNP    Component Value Date/Time   BNP 36.0 05/14/2019 1154    ProBNP No results found for: PROBNP  Imaging: No results found.   Assessment & Plan:   No problem-specific Assessment & Plan notes found for this encounter.     Martyn Ehrich, NP 05/04/2021

## 2021-05-12 ENCOUNTER — Encounter (INDEPENDENT_AMBULATORY_CARE_PROVIDER_SITE_OTHER): Payer: Self-pay | Admitting: Bariatrics

## 2021-05-12 ENCOUNTER — Ambulatory Visit (INDEPENDENT_AMBULATORY_CARE_PROVIDER_SITE_OTHER): Payer: Medicare Other | Admitting: Bariatrics

## 2021-05-12 ENCOUNTER — Other Ambulatory Visit: Payer: Self-pay

## 2021-05-12 VITALS — BP 124/81 | HR 89 | Temp 98.1°F | Ht 64.0 in | Wt 221.0 lb

## 2021-05-12 DIAGNOSIS — E7849 Other hyperlipidemia: Secondary | ICD-10-CM | POA: Diagnosis not present

## 2021-05-12 DIAGNOSIS — I10 Essential (primary) hypertension: Secondary | ICD-10-CM

## 2021-05-12 DIAGNOSIS — E669 Obesity, unspecified: Secondary | ICD-10-CM

## 2021-05-12 DIAGNOSIS — Z6837 Body mass index (BMI) 37.0-37.9, adult: Secondary | ICD-10-CM

## 2021-05-12 NOTE — Progress Notes (Signed)
Chief Complaint:   OBESITY Karen Vazquez is here to discuss her progress with her obesity treatment plan along with follow-up of her obesity related diagnoses. Karen Vazquez is on the Category 1 Plan or the Category 2 Plan and states she is following her eating plan approximately 50% of the time. Karen Vazquez states she is walking for 15 minutes 3 times per week.  Today's visit was #: 15 Starting weight: 245 lbs Starting date: 08/20/2020 Today's weight: 221 lbs Today's date: 05/12/2021 Total lbs lost to date: 24 Total lbs lost since last in-office visit: 8  Interim History: Karen Vazquez is down an additional 8 lbs. Her last visit with me was 09/27/2018, but she has been seeing Dawn. She is getting in more protein and water.  Subjective:   1. Other hyperlipidemia Karen Vazquez is taking Omega 3.  2. Essential hypertension Karen Vazquez's blood pressure is controlled.  Assessment/Plan:   1. Other hyperlipidemia Cardiovascular risk and specific lipid/LDL goals reviewed.  We discussed several lifestyle modifications today. Karen Vazquez will continue to work on diet, exercise and weight loss efforts. She is to eliminate trans fat, limit saturated fat to dark chocolate, and unprocessed meats. Orders and follow up as documented in patient record.   Counseling Intensive lifestyle modifications are the first line treatment for this issue. Dietary changes: Increase soluble fiber. Decrease simple carbohydrates. Exercise changes: Moderate to vigorous-intensity aerobic activity 150 minutes per week if tolerated. Lipid-lowering medications: see documented in medical record.  2. Essential hypertension Karen Vazquez will continue her medications, and will continue working on healthy weight loss and exercise to improve blood pressure control. We will watch for signs of hypotension as she continues her lifestyle modifications.  3. Obesity, current with BMI of 22.9 Karen Vazquez is currently in the action stage of change. As such, her goal is to continue with  weight loss efforts. She has agreed to the Category 1 Plan.   Meal planning. Will adhere closely to the plan.  Exercise goals: As is, add hand weights.  Behavioral modification strategies: increasing lean protein intake, decreasing simple carbohydrates, increasing vegetables, increasing water intake, decreasing eating out, no skipping meals, meal planning and cooking strategies, keeping healthy foods in the home, and planning for success.  Karen Vazquez has agreed to follow-up with our clinic in 3 to 4 weeks via video with [employer], FNP-C. She was informed of the importance of frequent follow-up visits to maximize her success with intensive lifestyle modifications for her multiple health conditions.   Objective:   Blood pressure 124/81, pulse 89, temperature 98.1 F (36.7 C), height 5\' 4"  (1.626 m), weight 221 lb (100.2 kg), last menstrual period 05/24/2010, SpO2 95 %. Body mass index is 37.93 kg/m.  General: Cooperative, alert, well developed, in no acute distress. HEENT: Conjunctivae and lids unremarkable. Cardiovascular: Regular rhythm.  Lungs: Normal work of breathing. Neurologic: No focal deficits.   Lab Results  Component Value Date   CREATININE 0.83 02/24/2021   BUN 12 02/24/2021   NA 143 02/24/2021   K 4.8 02/24/2021   CL 101 02/24/2021   CO2 28 02/24/2021   Lab Results  Component Value Date   ALT 18 02/24/2021   AST 19 02/24/2021   ALKPHOS 116 02/24/2021   BILITOT 0.5 02/24/2021   Lab Results  Component Value Date   HGBA1C 5.3 02/24/2021   HGBA1C 5.3 08/20/2020   HGBA1C 5.3 07/24/2018   HGBA1C 5.4 11/28/2017   HGBA1C 5.2 07/20/2017   Lab Results  Component Value Date   INSULIN 10.1 02/24/2021  INSULIN 19.2 08/20/2020   INSULIN 15.8 07/24/2018   INSULIN 7.8 11/28/2017   INSULIN 12.5 07/20/2017   Lab Results  Component Value Date   TSH 1.990 02/24/2021   Lab Results  Component Value Date   CHOL 180 02/24/2021   HDL 37 (L) 02/24/2021   LDLCALC 121  (H) 02/24/2021   TRIG 121 02/24/2021   CHOLHDL 5.1 (H) 07/20/2020   Lab Results  Component Value Date   VD25OH 43.9 02/24/2021   VD25OH 43.5 08/20/2020   VD25OH 32.1 02/06/2020   Lab Results  Component Value Date   WBC 5.2 02/24/2021   HGB 14.2 02/24/2021   HCT 43.0 02/24/2021   MCV 83 02/24/2021   PLT 156 02/24/2021   Lab Results  Component Value Date   IRON 102 02/24/2021   TIBC 405 02/24/2021   FERRITIN 32 02/24/2021   Attestation Statements:   Reviewed by clinician on day of visit: allergies, medications, problem list, medical history, surgical history, family history, social history, and previous encounter notes.   Wilhemena Durie, am acting as Location manager for [employer], DO.  I have reviewed the above documentation for accuracy and completeness, and I agree with the above. Jearld Lesch, DO

## 2021-05-13 ENCOUNTER — Encounter (INDEPENDENT_AMBULATORY_CARE_PROVIDER_SITE_OTHER): Payer: Self-pay | Admitting: Bariatrics

## 2021-05-27 ENCOUNTER — Telehealth: Payer: Self-pay | Admitting: Primary Care

## 2021-05-28 NOTE — Telephone Encounter (Signed)
We don't need to see her until after her HST is done  ?LMTCB  ? ? ?

## 2021-06-01 NOTE — Progress Notes (Signed)
?TeleHealth Visit:  ?Due to the COVID-19 pandemic, this visit was completed with telemedicine (audio/video) technology to reduce patient and provider exposure as well as to preserve personal protective equipment.  ? ?Karen Vazquez has verbally consented to this TeleHealth visit. The patient is located at home, the provider is located at home. The participants in this visit include the listed provider and patient. The visit was conducted today via MyChart video. ? ?OBESITY ?Karen Vazquez is here to discuss her progress with her obesity treatment plan along with follow-up of her obesity related diagnoses.  ? ?Today's date: 06/01/2021 ?Today's visit was #16 ?Starting weight: 245 lbs ?Weight at last in person visit: 221 lbs ?Today's reported weight: 222 lbs  ? ?Interim History: Karen Vazquez has been ill recently with an upper respiratory infection and has not had an appetite for the category 1 foods.  She has been eating what appeals to her such as soup and crackers.  She had a big loss last office visit-8 pounds.  Today she notes her weight has remained stable since last office visit. ?She is having a blepharoplasty on March 27 to improve her vision. ? ?Nutrition Plan: the Category 1 Plan. Not following well due to being ill. ?Hunger is well controlled. Cravings are well controlled.  ?Current exercise: walking 4 days/ 20 minutes per walk ?Assessment/Plan:  ?Hypertension ?BP at recent doctor visit was elevated- 150/80. Generally well controlled however.  ?Medication(s): amlodipine 2.5 mg daily. ?Denies chest pain and SOB. ?Hypertension well controlled.  ?BP Readings from Last 3 Encounters:  ?05/12/21 124/81  ?05/04/21 128/74  ?04/21/21 133/78  ? ?Lab Results  ?Component Value Date  ? CREATININE 0.83 02/24/2021  ? CREATININE 0.86 11/03/2020  ? CREATININE 0.92 02/06/2020  ? ? ?Plan: ?Current treatment plan is effective, no change in therapy.. ? ?Prediabetes ?Karen Vazquez has a diagnosis of prediabetes based on her previously elevated HgA1c  (5.8 in 2014). ?She denies polyphagia. ?Medication(s): metformin 500 mg at supper ?Lab Results  ?Component Value Date  ? HGBA1C 5.3 02/24/2021  ? ?Lab Results  ?Component Value Date  ? INSULIN 10.1 02/24/2021  ? INSULIN 19.2 08/20/2020  ? INSULIN 15.8 07/24/2018  ? INSULIN 7.8 11/28/2017  ? INSULIN 12.5 07/20/2017  ? ? ?Plan: ?Increase metformin to 500 mg BID with meals. ?Refill metformin. ? ?Obesity: Current BMI 37.9 ?Karen Vazquez is currently in the action stage of change. As such, her goal is to continue with weight loss efforts. She has agreed to the Category 1 Plan.  ? ?Exercise goals: continue to walk for exercise. ? ?Behavioral modification strategies: increasing lean protein intake and decreasing simple carbohydrates. ? ?Karen Vazquez has agreed to follow-up with our clinic in 4 weeks.  ? ?No orders of the defined types were placed in this encounter. ? ? ?There are no discontinued medications.  ? ?No orders of the defined types were placed in this encounter. ?   ? ?Objective:  ? ?VITALS: Per patient if applicable, see vitals. ?GENERAL: Alert and in no acute distress. ?CARDIOPULMONARY: No increased WOB. Speaking in clear sentences.  ?PSYCH: Pleasant and cooperative. Speech normal rate and rhythm. Affect is appropriate. Insight and judgement are appropriate. Attention is focused, linear, and appropriate.  ?NEURO: Oriented as arrived to appointment on time with no prompting.  ? ?Lab Results  ?Component Value Date  ? CREATININE 0.83 02/24/2021  ? BUN 12 02/24/2021  ? NA 143 02/24/2021  ? K 4.8 02/24/2021  ? CL 101 02/24/2021  ? CO2 28 02/24/2021  ? ?Lab Results  ?Component Value  Date  ? ALT 18 02/24/2021  ? AST 19 02/24/2021  ? ALKPHOS 116 02/24/2021  ? BILITOT 0.5 02/24/2021  ? ?Lab Results  ?Component Value Date  ? HGBA1C 5.3 02/24/2021  ? HGBA1C 5.3 08/20/2020  ? HGBA1C 5.3 07/24/2018  ? HGBA1C 5.4 11/28/2017  ? HGBA1C 5.2 07/20/2017  ? ?Lab Results  ?Component Value Date  ? INSULIN 10.1 02/24/2021  ? INSULIN 19.2  08/20/2020  ? INSULIN 15.8 07/24/2018  ? INSULIN 7.8 11/28/2017  ? INSULIN 12.5 07/20/2017  ? ?Lab Results  ?Component Value Date  ? TSH 1.990 02/24/2021  ? ?Lab Results  ?Component Value Date  ? CHOL 180 02/24/2021  ? HDL 37 (L) 02/24/2021  ? LDLCALC 121 (H) 02/24/2021  ? TRIG 121 02/24/2021  ? CHOLHDL 5.1 (H) 07/20/2020  ? ?Lab Results  ?Component Value Date  ? WBC 5.2 02/24/2021  ? HGB 14.2 02/24/2021  ? HCT 43.0 02/24/2021  ? MCV 83 02/24/2021  ? PLT 156 02/24/2021  ? ?Lab Results  ?Component Value Date  ? IRON 102 02/24/2021  ? TIBC 405 02/24/2021  ? FERRITIN 32 02/24/2021  ? ?Lab Results  ?Component Value Date  ? VD25OH 43.9 02/24/2021  ? VD25OH 43.5 08/20/2020  ? VD25OH 32.1 02/06/2020  ? ? ?Attestation Statements:  ? ?Reviewed by clinician on day of visit: allergies, medications, problem list, medical history, surgical history, family history, social history, and previous encounter notes. ? ? ? ?

## 2021-06-02 ENCOUNTER — Encounter (INDEPENDENT_AMBULATORY_CARE_PROVIDER_SITE_OTHER): Payer: Self-pay | Admitting: Family Medicine

## 2021-06-02 ENCOUNTER — Telehealth (INDEPENDENT_AMBULATORY_CARE_PROVIDER_SITE_OTHER): Payer: Medicare Other | Admitting: Family Medicine

## 2021-06-02 DIAGNOSIS — I10 Essential (primary) hypertension: Secondary | ICD-10-CM | POA: Diagnosis not present

## 2021-06-02 DIAGNOSIS — E669 Obesity, unspecified: Secondary | ICD-10-CM | POA: Diagnosis not present

## 2021-06-02 DIAGNOSIS — R7303 Prediabetes: Secondary | ICD-10-CM | POA: Diagnosis not present

## 2021-06-02 DIAGNOSIS — Z6837 Body mass index (BMI) 37.0-37.9, adult: Secondary | ICD-10-CM

## 2021-06-02 MED ORDER — METFORMIN HCL 500 MG PO TABS: 500.0000 mg | ORAL_TABLET | Freq: Two times a day (BID) | ORAL | 0 refills | Status: DC

## 2021-06-11 ENCOUNTER — Telehealth: Payer: Federal, State, Local not specified - PPO | Admitting: Primary Care

## 2021-06-29 NOTE — Telephone Encounter (Signed)
Pt has HST scheduled 4/13. Once reviewed by [facility], will get pt in for an appt. ?

## 2021-06-30 ENCOUNTER — Encounter (INDEPENDENT_AMBULATORY_CARE_PROVIDER_SITE_OTHER): Payer: Self-pay | Admitting: Bariatrics

## 2021-06-30 ENCOUNTER — Ambulatory Visit (INDEPENDENT_AMBULATORY_CARE_PROVIDER_SITE_OTHER): Payer: Medicare Other | Admitting: Bariatrics

## 2021-06-30 VITALS — BP 141/82 | HR 70 | Temp 97.9°F | Ht 64.0 in | Wt 223.0 lb

## 2021-06-30 DIAGNOSIS — R7303 Prediabetes: Secondary | ICD-10-CM | POA: Diagnosis not present

## 2021-06-30 DIAGNOSIS — Z6838 Body mass index (BMI) 38.0-38.9, adult: Secondary | ICD-10-CM

## 2021-06-30 DIAGNOSIS — I10 Essential (primary) hypertension: Secondary | ICD-10-CM | POA: Diagnosis not present

## 2021-06-30 DIAGNOSIS — E669 Obesity, unspecified: Secondary | ICD-10-CM

## 2021-06-30 MED ORDER — METFORMIN HCL 500 MG PO TABS: 500.0000 mg | ORAL_TABLET | Freq: Two times a day (BID) | ORAL | 0 refills | Status: DC

## 2021-07-01 ENCOUNTER — Ambulatory Visit: Payer: Medicare Other

## 2021-07-01 DIAGNOSIS — G4733 Obstructive sleep apnea (adult) (pediatric): Secondary | ICD-10-CM | POA: Diagnosis not present

## 2021-07-05 ENCOUNTER — Ambulatory Visit (INDEPENDENT_AMBULATORY_CARE_PROVIDER_SITE_OTHER): Payer: Medicare Other | Admitting: Family Medicine

## 2021-07-05 VITALS — BP 116/82 | HR 80 | Temp 98.4°F | Ht 64.0 in | Wt 225.8 lb

## 2021-07-05 DIAGNOSIS — G2581 Restless legs syndrome: Secondary | ICD-10-CM

## 2021-07-05 DIAGNOSIS — R7303 Prediabetes: Secondary | ICD-10-CM | POA: Diagnosis not present

## 2021-07-05 DIAGNOSIS — E039 Hypothyroidism, unspecified: Secondary | ICD-10-CM | POA: Diagnosis not present

## 2021-07-05 DIAGNOSIS — G4700 Insomnia, unspecified: Secondary | ICD-10-CM

## 2021-07-05 MED ORDER — ALPRAZOLAM 0.5 MG PO TABS
ORAL_TABLET | ORAL | 5 refills | Status: DC
Start: 2021-07-05 — End: 2022-01-04

## 2021-07-05 MED ORDER — PREGABALIN 50 MG PO CAPS: ORAL_CAPSULE | ORAL | 5 refills | Status: DC

## 2021-07-05 MED ORDER — PANTOPRAZOLE SODIUM 40 MG PO TBEC: 40.0000 mg | DELAYED_RELEASE_TABLET | Freq: Every day | ORAL | 5 refills | Status: DC

## 2021-07-05 MED ORDER — ZOLPIDEM TARTRATE 5 MG PO TABS: ORAL_TABLET | ORAL | 5 refills | Status: DC

## 2021-07-05 MED ORDER — AMLODIPINE BESYLATE 2.5 MG PO TABS: 2.5000 mg | ORAL_TABLET | Freq: Every day | ORAL | 5 refills | Status: DC

## 2021-07-05 NOTE — Progress Notes (Signed)
? ?  Subjective:  ? ? Patient ID: Karen Vazquez, female    DOB: Aug 23, 1955, 66 y.o.   MRN: [id number] ? ?HPI ? ?Patient here for 6 month follow up on medications. ?Patient has done a fantastic job recently at losing weight ?She is working hard on portion control regular activity ?Most days she feels pretty good ?She does have difficult time staying asleep ?She states her stress levels are reasonable ?Takes her thyroid medicine regular basis ?Had blood work in December these were reviewed ? ? ?Review of Systems ? ?   ?Objective:  ? Physical Exam ? ?General-in no acute distress ?Eyes-no discharge ?Lungs-respiratory rate normal, CTA ?CV-no murmurs,RRR ?Extremities skin warm dry no edema ?Neuro grossly normal ?Behavior normal, alert ? ? ? ?   ?Assessment & Plan:  ?1. Restless legs syndrome ?Currently on Lyrica each evening does adequate for her ? ?2. Hypothyroidism, unspecified type ?As for her thyroid medicine she takes it on a regular basis previous labs in December looked good no additional labs necessary currently ? ?3. Morbid obesity ([place]) ?Patient doing a fantastic job losing weight her BMI is now down to 38.76 watching portions and staying active fitting and walking when she can ? ?4. Prediabetes ?Recent A1c look good taking metformin ? ?5. Insomnia, unspecified type ?Uses Ambien at nighttime she states she has a hard time staying asleep but we did discuss how higher doses of medicines are associated with further health issues including accidental falls ? ?We will see her back in 6 months ? ? ?

## 2021-07-07 DIAGNOSIS — G4733 Obstructive sleep apnea (adult) (pediatric): Secondary | ICD-10-CM | POA: Diagnosis not present

## 2021-07-08 ENCOUNTER — Encounter (INDEPENDENT_AMBULATORY_CARE_PROVIDER_SITE_OTHER): Payer: Self-pay | Admitting: Bariatrics

## 2021-07-08 NOTE — Progress Notes (Signed)
? ? ? ?Chief Complaint:  ? ?OBESITY ?Karen Vazquez is here to discuss her progress with her obesity treatment plan along with follow-up of her obesity related diagnoses. Karen Vazquez is on the Category 1 Plan and the Category 2 Plan and states she is following her eating plan approximately 50% of the time. Karen Vazquez states she is doing 0 minutes 0 times per week. ? ?Today's visit was #: 55 ?Starting weight: 245 lbs ?Starting date: 08/20/2020 ?Today's weight: 223 lbs ?Today's date: 06/30/2021 ?Total lbs lost to date: 22 lbs ?Total lbs lost since last in-office visit: 0 ? ?Interim History: Karen Vazquez is up 2 lbs since her last visit, but has done well overall.  ? ?Subjective:  ? ?1. Pre-diabetes/Insulin resistance ?Karen Vazquez is currently taking Metfomin.  ? ?2. Essential hypertension ?Karen Vazquez's blood pressure is controlled. Her blood pressure was 124/81 on 05/12/2021. ? ?Assessment/Plan:  ? ?1. Pre-diabetes/Insulin resistance ?Karen Vazquez will continue to work on weight loss, exercise, and decreasing simple carbohydrates to help decrease the risk of diabetes. We will refill Metformin 500 mg for 1 month with no refills.  ? ?- metFORMIN (GLUCOPHAGE) 500 MG tablet; Take 1 tablet (500 mg total) by mouth 2 (two) times daily with a meal.  Dispense: 60 tablet; Refill: 0 ? ?2. Essential hypertension ?Karen Vazquez will continue her medications. She is working on healthy weight loss and exercise to improve blood pressure control. We will watch for signs of hypotension as she continues her lifestyle modifications. ? ?3. Obesity, current with BMI of 38.3 ?Karen Vazquez is currently in the action stage of change. As such, her goal is to continue with weight loss efforts. She has agreed to the Category 1 Plan and the Category 2 Plan.  ? ?Karen Vazquez will increase her water intake and protein. She will get back on track.  ? ?Exercise goals:  Karen Vazquez will resume her walking.  ? ?Behavioral modification strategies: increasing lean protein intake, decreasing simple carbohydrates, increasing  vegetables, increasing water intake, decreasing eating out, no skipping meals, meal planning and cooking strategies, keeping healthy foods in the home, and planning for success. ? ?Karen Vazquez has agreed to follow-up with our clinic in 4 weeks with Jake Bathe, FNP and 8 weeks with myself. She was informed of the importance of frequent follow-up visits to maximize her success with intensive lifestyle modifications for her multiple health conditions.  ? ?Objective:  ? ?Blood pressure (!) 141/82, pulse 70, temperature 97.9 ?F (36.6 ?C), height '5\' 4"'$  (1.626 m), weight 223 lb (101.2 kg), last menstrual period 05/24/2010, SpO2 96 %. ?Body mass index is 38.28 kg/m?. ? ?General: Cooperative, alert, well developed, in no acute distress. ?HEENT: Conjunctivae and lids unremarkable. ?Cardiovascular: Regular rhythm.  ?Lungs: Normal work of breathing. ?Neurologic: No focal deficits.  ? ?Lab Results  ?Component Value Date  ? CREATININE 0.83 02/24/2021  ? BUN 12 02/24/2021  ? NA 143 02/24/2021  ? K 4.8 02/24/2021  ? CL 101 02/24/2021  ? CO2 28 02/24/2021  ? ?Lab Results  ?Component Value Date  ? ALT 18 02/24/2021  ? AST 19 02/24/2021  ? ALKPHOS 116 02/24/2021  ? BILITOT 0.5 02/24/2021  ? ?Lab Results  ?Component Value Date  ? HGBA1C 5.3 02/24/2021  ? HGBA1C 5.3 08/20/2020  ? HGBA1C 5.3 07/24/2018  ? HGBA1C 5.4 11/28/2017  ? HGBA1C 5.2 07/20/2017  ? ?Lab Results  ?Component Value Date  ? INSULIN 10.1 02/24/2021  ? INSULIN 19.2 08/20/2020  ? INSULIN 15.8 07/24/2018  ? INSULIN 7.8 11/28/2017  ? INSULIN 12.5 07/20/2017  ? ?  Lab Results  ?Component Value Date  ? TSH 1.990 02/24/2021  ? ?Lab Results  ?Component Value Date  ? CHOL 180 02/24/2021  ? HDL 37 (L) 02/24/2021  ? LDLCALC 121 (H) 02/24/2021  ? TRIG 121 02/24/2021  ? CHOLHDL 5.1 (H) 07/20/2020  ? ?Lab Results  ?Component Value Date  ? VD25OH 43.9 02/24/2021  ? VD25OH 43.5 08/20/2020  ? VD25OH 32.1 02/06/2020  ? ?Lab Results  ?Component Value Date  ? WBC 5.2 02/24/2021  ? HGB 14.2  02/24/2021  ? HCT 43.0 02/24/2021  ? MCV 83 02/24/2021  ? PLT 156 02/24/2021  ? ?Lab Results  ?Component Value Date  ? IRON 102 02/24/2021  ? TIBC 405 02/24/2021  ? FERRITIN 32 02/24/2021  ? ?Attestation Statements:  ? ?Reviewed by clinician on day of visit: allergies, medications, problem list, medical history, surgical history, family history, social history, and previous encounter notes. ? ?I, Lizbeth Bark, RMA, am acting as transcriptionist for [employer], DO. ? ?I have reviewed the above documentation for accuracy and completeness, and I agree with the above. Jearld Lesch, DO ? ?

## 2021-07-13 NOTE — Assessment & Plan Note (Signed)
-   Patient has hx sleep apnea, dx in 2008. Not consistently wearing CPAP. She continues to have symptoms of loud snoring and restless sleep. Epworth score 8. Needs HST to re-evaluate OSA. Discussed risk of untreated sleep apnea including cardiac arrhthymias, stroke, pulm HTN and DM. Reviewed treatment options briefly. Needs follow-up in 4-6 weeks to review sleep study results and treatment options if needed.  ?

## 2021-07-19 MED ORDER — THYROID 30 MG PO TABS: ORAL_TABLET | ORAL | 5 refills | Status: DC

## 2021-07-19 MED ORDER — ARMOUR THYROID 15 MG PO TABS: ORAL_TABLET | ORAL | 5 refills | Status: DC

## 2021-07-19 NOTE — Addendum Note (Signed)
Addended by: Dairl Ponder on: 07/19/2021 04:24 PM ? ? Modules accepted: Orders ? ?

## 2021-07-19 NOTE — Progress Notes (Signed)
Prescription sent electronically to pharmacy. Blood work ordered in [employer]. ?

## 2021-07-27 NOTE — Progress Notes (Signed)
?TeleHealth Visit:  ?This visit was completed with telemedicine (audio/video) technology. ?Karen Vazquez has verbally consented to this TeleHealth visit. The patient is located at home, the provider is located at home. The participants in this visit include the listed provider and patient. The visit was conducted today via MyChart video. ? ?OBESITY ?Karen Vazquez is here to discuss her progress with her obesity treatment plan along with follow-up of her obesity related diagnoses.  ? ?Today's visit was # 18 ?Starting weight: 245 lbs ?Starting date: 08/20/2020 ?Weight at last in office visit: 223 lbs on 06/30/21 ?Total weight loss: 22 lbs at last in office visit on 06/30/21. ?Today's reported weight:  No weight reported. ? ?Nutrition Plan: the Category 1 Plan and the Category 2 Plan.  ?Hunger is well controlled. Cravings are well controlled.  ?Current exercise:  walking  ? ?Interim History: Karen Vazquez recently traveled to [place] but feels she made good choices while traveling.  She drank plenty of water and she feels that she did a good job with protein intake.  She also reports that she resisted some of the desserts. ? ?Assessment/Plan:  ?1. OSA ?Karen Vazquez recently had a home sleep study which she said showed that she had sleep apnea.  However she has not had a follow-up with the provider.  She saw Geraldo Pitter, NP at [facility] pul[facility]ology. ? ?Plan: ?She will make an appointment for follow-up with Geraldo Pitter, NP. ? ?Obesity: Current BMI 38.3 ?Karen Vazquez is currently in the action stage of change. As such, her goal is to continue with weight loss efforts.  ?She has agreed to the Category 1 Plan and the Category 2 Plan.  ? ?Exercise goals: Older adults should follow the adult guidelines. When older adults cannot meet the adult guidelines, they should be as physically active as their abilities and conditions will allow.  ? ?Behavioral modification strategies: increasing lean protein intake and decreasing simple carbohydrates. ? ?Karen Vazquez  has agreed to follow-up with our clinic in 4 weeks.  ? ?No orders of the defined types were placed in this encounter. ? ? ?There are no discontinued medications.  ? ?No orders of the defined types were placed in this encounter. ?   ? ?Objective:  ? ?VITALS: Per patient if applicable, see vitals. ?GENERAL: Alert and in no acute distress. ?CARDIOPULMONARY: No increased WOB. Speaking in clear sentences.  ?PSYCH: Pleasant and cooperative. Speech normal rate and rhythm. Affect is appropriate. Insight and judgement are appropriate. Attention is focused, linear, and appropriate.  ?NEURO: Oriented as arrived to appointment on time with no prompting.  ? ?Lab Results  ?Component Value Date  ? CREATININE 0.83 02/24/2021  ? BUN 12 02/24/2021  ? NA 143 02/24/2021  ? K 4.8 02/24/2021  ? CL 101 02/24/2021  ? CO2 28 02/24/2021  ? ?Lab Results  ?Component Value Date  ? ALT 18 02/24/2021  ? AST 19 02/24/2021  ? ALKPHOS 116 02/24/2021  ? BILITOT 0.5 02/24/2021  ? ?Lab Results  ?Component Value Date  ? HGBA1C 5.3 02/24/2021  ? HGBA1C 5.3 08/20/2020  ? HGBA1C 5.3 07/24/2018  ? HGBA1C 5.4 11/28/2017  ? HGBA1C 5.2 07/20/2017  ? ?Lab Results  ?Component Value Date  ? INSULIN 10.1 02/24/2021  ? INSULIN 19.2 08/20/2020  ? INSULIN 15.8 07/24/2018  ? INSULIN 7.8 11/28/2017  ? INSULIN 12.5 07/20/2017  ? ?Lab Results  ?Component Value Date  ? TSH 1.990 02/24/2021  ? ?Lab Results  ?Component Value Date  ? CHOL 180 02/24/2021  ? HDL 37 (L) 02/24/2021  ?  LDLCALC 121 (H) 02/24/2021  ? TRIG 121 02/24/2021  ? CHOLHDL 5.1 (H) 07/20/2020  ? ?Lab Results  ?Component Value Date  ? WBC 5.2 02/24/2021  ? HGB 14.2 02/24/2021  ? HCT 43.0 02/24/2021  ? MCV 83 02/24/2021  ? PLT 156 02/24/2021  ? ?Lab Results  ?Component Value Date  ? IRON 102 02/24/2021  ? TIBC 405 02/24/2021  ? FERRITIN 32 02/24/2021  ? ?Lab Results  ?Component Value Date  ? VD25OH 43.9 02/24/2021  ? VD25OH 43.5 08/20/2020  ? VD25OH 32.1 02/06/2020  ? ? ?Attestation Statements:  ? ?Reviewed by  clinician on day of visit: allergies, medications, problem list, medical history, surgical history, family history, social history, and previous encounter notes. ? ?

## 2021-07-28 ENCOUNTER — Telehealth (INDEPENDENT_AMBULATORY_CARE_PROVIDER_SITE_OTHER): Payer: Medicare Other | Admitting: Family Medicine

## 2021-07-28 ENCOUNTER — Encounter (INDEPENDENT_AMBULATORY_CARE_PROVIDER_SITE_OTHER): Payer: Self-pay | Admitting: Family Medicine

## 2021-07-28 DIAGNOSIS — E669 Obesity, unspecified: Secondary | ICD-10-CM | POA: Diagnosis not present

## 2021-07-28 DIAGNOSIS — Z6838 Body mass index (BMI) 38.0-38.9, adult: Secondary | ICD-10-CM | POA: Diagnosis not present

## 2021-07-28 DIAGNOSIS — G4733 Obstructive sleep apnea (adult) (pediatric): Secondary | ICD-10-CM

## 2021-08-02 NOTE — Telephone Encounter (Signed)
Scheduled pt for My Chart visit tomorrow. Notified pt via My Chart message.  Nothing further needed at this time.  ? ?Routing to [employer] as FYI ?

## 2021-08-03 ENCOUNTER — Telehealth (INDEPENDENT_AMBULATORY_CARE_PROVIDER_SITE_OTHER): Payer: Medicare Other | Admitting: Primary Care

## 2021-08-03 ENCOUNTER — Encounter: Payer: Self-pay | Admitting: Family Medicine

## 2021-08-03 DIAGNOSIS — G4733 Obstructive sleep apnea (adult) (pediatric): Secondary | ICD-10-CM

## 2021-08-03 MED ORDER — HYDROXYZINE HCL 25 MG PO TABS: 25.0000 mg | ORAL_TABLET | Freq: Every evening | ORAL | 1 refills | Status: DC | PRN

## 2021-08-03 NOTE — Patient Instructions (Signed)
Home sleep study showed mild complex obstructive/central sleep apnea, you had on average 12 events an hour.  Recommend resuming CPAP use, we will get a CPAP titration study in lab to determine best pressure settings ? ?Advise coming off Ambien if able, you do not need to titrate off of this.  ?Start Atarax 25 mg at bedtime as needed for insomnia (do not combine with other sleep aids) ? ?Orders ?CPAP titration ? ?Follow-up ?8 weeks with Beth NP or sooner if needed ?

## 2021-08-03 NOTE — Progress Notes (Signed)
Virtual Visit via Video Note ? ?I connected with Karen Vazquez on [birth year] at  2:00 PM EDT by a video enabled telemedicine application and verified that I am speaking with the correct person using two identifiers. ? ?Location: ?Patient: Home ?Provider: Office ?  ?I discussed the limitations of evaluation and management by telemedicine and the availability of in person appointments. The patient expressed understanding and agreed to proceed. ? ?History of Present Illness: ?66 year old female, never smoked.  Past medical history significant for obstructive sleep apnea, bronchitis, hypertension, GERD. ? ?Previous [facility] pulmonary encounter: ?05/04/2021 ?Patient presents today for sleep consult. She has a history of sleep apnea, she had a sleep study in 2008. She is not consistently using CPAP. She is having a hard time tolerating. Uses nasal pillow mask. She has symptoms of snoring and restless sleep. Typical bedtime is between 10-11pm. It takes her 30-40 mins to fall asleep. She wakes up on average 3-4 times a night. She starts her day at 7am. Weight is down. Epworth 8. Denies narcolepsy, cataplexy or sleep walking.  ? ?Sleep questionnaire  ?Previous sleep study- Hx OSA, 2008 ?Symptoms- Loud snoring, sleep disruption  ?Typical bedtime- 10-11pm  ?Time it takes to fall asleep- 30-40 mins  ?Nocturnal awakenings- 3-4 times  ?Time out of bed- 7am  ?Weight changes- down 28 lbs  ?Epworth - 8/24  ? ?08/03/2021- Interim hx  ?Patient contacted today for virtual video visit to review sleep study results.  Patient has a history of sleep apnea originally diagnosed in 2008.  She is not currently wearing CPAP.  Continues to have symptoms of snoring and restless sleep.  She had a home sleep study on 07/01/21 that showed mild complex sleep apnea/hypopnea syndrome, AHI 12.1/hr with SpO2 low 76% (basal 91%). Reviewed sleep study results, due to complexity of her sleep apnea recommending CPAP titration study.  Patient is in agreement to  restarting PAP therapy. She would likely not being a candidate for inspire.    She does take 5 mg of Ambien at bedtime nightly for history of insomnia but is interested in coming off of medication.  She has a working CPAP machine, she waas previously using nasal pillow mask. DME company is [employer].   ?  ?Observations/Objective: ? ?- Appears well, No overt resp symptoms  ? ?Assessment and Plan: ? ?Mild complex sleep apnea ?- Symptoms of snoring and restless sleep ?- HST 07/01/21 >> mild complex sleep apnea/hypopnea syndrome, AHI 12.1/hr with SpO2 low 76% (basal 91%). ?- We reviewed treatment options, recommend resuming PAP therapy. She will need to CPAP titration study in lab, once completed we will place order for correct pressure settings and mask with [employer] ?- FU in 8 weeks for compliance check ? ?Insomnia: ?- Currently taking '5mg'$  Ambien at bedtime nightly for insomnia.  She had several central apneas on recent sleep study. Recommend coming off Ambien if able, no need to titrate medication. Trial Atarax '25mg'$  at bedtime to help initiate sleep, she can cut dose in half if needed. ? ?Follow Up Instructions: ? ? 8 weeks with Karen Maize NP ? ?I discussed the assessment and treatment plan with the patient. The patient was provided an opportunity to ask questions and all were answered. The patient agreed with the plan and demonstrated an understanding of the instructions. ?  ?The patient was advised to call back or seek an in-person evaluation if the symptoms worsen or if the condition fails to improve as anticipated. ? ?I provided 25 minutes of non-face-to-face time  during this encounter. ? ? ?Martyn Ehrich, NP ? ?

## 2021-08-03 NOTE — Progress Notes (Signed)
Reviewed and agree with assessment/plan. ? ? ?Chesley Mires, MD ?[place] ?08/03/2021, 2:57 PM ?Pager:  [phone] ? ?

## 2021-08-04 NOTE — Telephone Encounter (Signed)
She had mild but complex sleep apnea on home sleep study so I wanted a titration study to ensure correct CPAP settings built also to make sure she does not need BIPAP which is bilevel pressure support. We can certainly get her a new machine after titration study.

## 2021-08-04 NOTE — Telephone Encounter (Signed)
Mychart message sent by pt: ? ?Karen Vazquez  P [facility] (supporting Martyn Ehrich, NP) 28 minutes ago (10:22 AM)  ? ?Beth, I called [employer] this morning as they are my CPAP supplier. They say I got my machine in 2017 and am eligible for a new one since it?s been 5 years. I asked whether they could use my home study test to do the settings and she said they should be able to without another sleep apnea test. Is that ok with you? ?Thank you, ?Karen Vazquez ? ? ? ? ? ?Beth, please advise on this. ?

## 2021-08-10 ENCOUNTER — Ambulatory Visit (INDEPENDENT_AMBULATORY_CARE_PROVIDER_SITE_OTHER): Payer: Medicare Other | Admitting: Family Medicine

## 2021-08-10 ENCOUNTER — Ambulatory Visit (INDEPENDENT_AMBULATORY_CARE_PROVIDER_SITE_OTHER): Payer: Medicare Other

## 2021-08-10 VITALS — Ht 64.0 in | Wt 225.0 lb

## 2021-08-10 VITALS — BP 130/70 | HR 77 | Temp 98.0°F | Ht 64.0 in | Wt 225.8 lb

## 2021-08-10 DIAGNOSIS — R300 Dysuria: Secondary | ICD-10-CM | POA: Diagnosis not present

## 2021-08-10 DIAGNOSIS — N390 Urinary tract infection, site not specified: Secondary | ICD-10-CM | POA: Insufficient documentation

## 2021-08-10 DIAGNOSIS — N3000 Acute cystitis without hematuria: Secondary | ICD-10-CM | POA: Diagnosis not present

## 2021-08-10 DIAGNOSIS — Z0000 Encounter for general adult medical examination without abnormal findings: Secondary | ICD-10-CM

## 2021-08-10 DIAGNOSIS — N951 Menopausal and female climacteric states: Secondary | ICD-10-CM | POA: Insufficient documentation

## 2021-08-10 LAB — POCT URINALYSIS DIPSTICK
Bilirubin, UA: NEGATIVE
Blood, UA: NEGATIVE
Glucose, UA: NEGATIVE
Ketones, UA: NEGATIVE
Nitrite, UA: NEGATIVE
Protein, UA: NEGATIVE
Spec Grav, UA: 1.01 (ref 1.010–1.025)
Urobilinogen, UA: 0.2 E.U./dL
pH, UA: 7 (ref 5.0–8.0)

## 2021-08-10 MED ORDER — CEPHALEXIN 500 MG PO CAPS
500.0000 mg | ORAL_CAPSULE | Freq: Two times a day (BID) | ORAL | 0 refills | Status: DC
Start: 2021-08-10 — End: 2021-08-19

## 2021-08-10 NOTE — Assessment & Plan Note (Signed)
Trace leukocytes on urinalysis.  Given symptoms and the fact that she has leukocytes on urinalysis, I am placing her empirically on Keflex while awaiting culture.

## 2021-08-10 NOTE — Patient Instructions (Signed)
Karen Vazquez , Thank you for taking time to come for your Medicare Wellness Visit. I appreciate your ongoing commitment to your health goals. Please review the following plan we discussed and let me know if I can assist you in the future.   Screening recommendations/referrals: Colonoscopy: Done 03/03/2016 Repeat every 7 years. Mammogram: Done 08/01/2019. Repeat annually  Bone Density: Done 07/29/2020 Repeat every 2 years  Recommended yearly ophthalmology/optometry visit for glaucoma screening and checkup Recommended yearly dental visit for hygiene and checkup  Vaccinations: Influenza vaccine: Done 01/16/2020 Repeat annually  Pneumococcal vaccine: Done 07/21/2020 Tdap vaccine: Done 01/01/2018 Repeat in 10 years  Shingles vaccine: Done 01/31/2018 and 07/31/2020.   Covid-19: Done 03/05/2020, 04/25/2019 and 04/02/2019.  Advanced directives: Please bring a copy of your health care power of attorney and living will to the office to be added to your chart at your convenience.   Conditions/risks identified: Aim for 30 minutes of exercise or brisk walking, 6-8 glasses of water, and 5 servings of fruits and vegetables each day.   Next appointment: Follow up in one year for your annual wellness visit 2024.   Preventive Care 57 Years and Older, Female Preventive care refers to lifestyle choices and visits with your health care provider that can promote health and wellness. What does preventive care include? A yearly physical exam. This is also called an annual well check. Dental exams once or twice a year. Routine eye exams. Ask your health care provider how often you should have your eyes checked. Personal lifestyle choices, including: Daily care of your teeth and gums. Regular physical activity. Eating a healthy diet. Avoiding tobacco and drug use. Limiting alcohol use. Practicing safe sex. Taking low-dose aspirin every day. Taking vitamin and mineral supplements as recommended by your  health care provider. What happens during an annual well check? The services and screenings done by your health care provider during your annual well check will depend on your age, overall health, lifestyle risk factors, and family history of disease. Counseling  Your health care provider may ask you questions about your: Alcohol use. Tobacco use. Drug use. Emotional well-being. Home and relationship well-being. Sexual activity. Eating habits. History of falls. Memory and ability to understand (cognition). Work and work Statistician. Reproductive health. Screening  You may have the following tests or measurements: Height, weight, and BMI. Blood pressure. Lipid and cholesterol levels. These may be checked every 5 years, or more frequently if you are over 27 years old. Skin check. Lung cancer screening. You may have this screening every year starting at age 84 if you have a 30-pack-year history of smoking and currently smoke or have quit within the past 15 years. Fecal occult blood test (FOBT) of the stool. You may have this test every year starting at age 11. Flexible sigmoidoscopy or colonoscopy. You may have a sigmoidoscopy every 5 years or a colonoscopy every 10 years starting at age 42. Hepatitis C blood test. Hepatitis B blood test. Sexually transmitted disease (STD) testing. Diabetes screening. This is done by checking your blood sugar (glucose) after you have not eaten for a while (fasting). You may have this done every 1-3 years. Bone density scan. This is done to screen for osteoporosis. You may have this done starting at age 56. Mammogram. This may be done every 1-2 years. Talk to your health care provider about how often you should have regular mammograms. Talk with your health care provider about your test results, treatment options, and if necessary, the need for  more tests. Vaccines  Your health care provider may recommend certain vaccines, such as: Influenza vaccine.  This is recommended every year. Tetanus, diphtheria, and acellular pertussis (Tdap, Td) vaccine. You may need a Td booster every 10 years. Zoster vaccine. You may need this after age 32. Pneumococcal 13-valent conjugate (PCV13) vaccine. One dose is recommended after age 82. Pneumococcal polysaccharide (PPSV23) vaccine. One dose is recommended after age 35. Talk to your health care provider about which screenings and vaccines you need and how often you need them. This information is not intended to replace advice given to you by your health care provider. Make sure you discuss any questions you have with your health care provider. Document Released: 04/03/2015 Document Revised: 11/25/2015 Document Reviewed: 01/06/2015 Elsevier Interactive Patient Education  2017 [place] Prevention in the Home Falls can cause injuries. They can happen to people of all ages. There are many things you can do to make your home safe and to help prevent falls. What can I do on the outside of my home? Regularly fix the edges of walkways and driveways and fix any cracks. Remove anything that might make you trip as you walk through a door, such as a raised step or threshold. Trim any bushes or trees on the path to your home. Use bright outdoor lighting. Clear any walking paths of anything that might make someone trip, such as rocks or tools. Regularly check to see if handrails are loose or broken. Make sure that both sides of any steps have handrails. Any raised decks and porches should have guardrails on the edges. Have any leaves, snow, or ice cleared regularly. Use sand or salt on walking paths during winter. Clean up any spills in your garage right away. This includes oil or grease spills. What can I do in the bathroom? Use night lights. Install grab bars by the toilet and in the tub and shower. Do not use towel bars as grab bars. Use non-skid mats or decals in the tub or shower. If you need to sit  down in the shower, use a plastic, non-slip stool. Keep the floor dry. Clean up any water that spills on the floor as soon as it happens. Remove soap buildup in the tub or shower regularly. Attach bath mats securely with double-sided non-slip rug tape. Do not have throw rugs and other things on the floor that can make you trip. What can I do in the bedroom? Use night lights. Make sure that you have a light by your bed that is easy to reach. Do not use any sheets or blankets that are too big for your bed. They should not hang down onto the floor. Have a firm chair that has side arms. You can use this for support while you get dressed. Do not have throw rugs and other things on the floor that can make you trip. What can I do in the kitchen? Clean up any spills right away. Avoid walking on wet floors. Keep items that you use a lot in easy-to-reach places. If you need to reach something above you, use a strong step stool that has a grab bar. Keep electrical cords out of the way. Do not use floor polish or wax that makes floors slippery. If you must use wax, use non-skid floor wax. Do not have throw rugs and other things on the floor that can make you trip. What can I do with my stairs? Do not leave any items on the stairs. Make  sure that there are handrails on both sides of the stairs and use them. Fix handrails that are broken or loose. Make sure that handrails are as long as the stairways. Check any carpeting to make sure that it is firmly attached to the stairs. Fix any carpet that is loose or worn. Avoid having throw rugs at the top or bottom of the stairs. If you do have throw rugs, attach them to the floor with carpet tape. Make sure that you have a light switch at the top of the stairs and the bottom of the stairs. If you do not have them, ask someone to add them for you. What else can I do to help prevent falls? Wear shoes that: Do not have high heels. Have rubber bottoms. Are  comfortable and fit you well. Are closed at the toe. Do not wear sandals. If you use a stepladder: Make sure that it is fully opened. Do not climb a closed stepladder. Make sure that both sides of the stepladder are locked into place. Ask someone to hold it for you, if possible. Clearly mark and make sure that you can see: Any grab bars or handrails. First and last steps. Where the edge of each step is. Use tools that help you move around (mobility aids) if they are needed. These include: Canes. Walkers. Scooters. Crutches. Turn on the lights when you go into a dark area. Replace any light bulbs as soon as they burn out. Set up your furniture so you have a clear path. Avoid moving your furniture around. If any of your floors are uneven, fix them. If there are any pets around you, be aware of where they are. Review your medicines with your doctor. Some medicines can make you feel dizzy. This can increase your chance of falling. Ask your doctor what other things that you can do to help prevent falls. This information is not intended to replace advice given to you by your health care provider. Make sure you discuss any questions you have with your health care provider. Document Released: 01/01/2009 Document Revised: 08/13/2015 Document Reviewed: 04/11/2014 Elsevier Interactive Patient Education  2017 [employer].

## 2021-08-10 NOTE — Progress Notes (Signed)
Subjective:  Patient ID: Karen Vazquez, female    DOB: 08-15-1955  Age: 66 y.o. MRN: [id number]  CC: Chief Complaint  Patient presents with   Dysuria    Since last Thursday    HPI:  66 year old female presents for evaluation of the above.  Patient reports that she has had symptoms since Thursday.  She reports urinary frequency and intermittent dysuria.  No abdominal pain.  No flank pain.  No fever.  She is concerned that she has UTI.  No known inciting factor.  No relieving factors.  No other complaints.  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   History of colonic polyps 04/26/2016   OA (osteoarthritis) of knee 11/17/2014   Essential hypertension 05/28/2014   Obstructive sleep apnea 05/28/2014   Anemia, iron deficiency 12/16/2013   Prediabetes 05/01/2013   GERD (gastroesophageal reflux disease) 02/06/2013   Hypothyroidism 12/12/2012   Insomnia 12/12/2012   Hyperlipidemia 12/12/2012   Osteoarthritis 12/12/2012   Restless legs syndrome 12/12/2012   S/P LASIK surgery of both eyes 03/21/1998    Social Hx   Social History   Socioeconomic History   Marital status: Married    Spouse name: Jori Moll   Number of children: Not on file   Years of education: Not on file   Highest education level: Not on file  Occupational History   Occupation: Retired  Tobacco Use   Smoking status: Never   Smokeless tobacco: Never  Vaping Use   Vaping Use: Never used  Substance and Sexual Activity   Alcohol use: No   Drug use: No   Sexual activity: Yes    Birth control/protection: Post-menopausal  Other Topics  Concern   Not on file  Social History Narrative   Not on file   Social Determinants of Health   Financial Resource Strain: Low Risk    Difficulty of Paying Living Expenses: Not very hard  Food Insecurity: No Food Insecurity   Worried About Charity fundraiser in the Last Year: Never true   Arboriculturist in the Last Year: Never true  Transportation Needs: No Transportation Needs   Lack of Transportation (Medical): No   Lack of Transportation (Non-Medical): No  Physical Activity: Insufficiently Active   Days of Exercise per Week: 3 days   Minutes of Exercise per Session: 30 min  Stress: Stress Concern Present   Feeling of Stress : To some extent  Social Connections: Engineer, building services of Communication with Friends and Family: More than three times a week   Frequency of Social Gatherings with Friends and Family: More than three times a week   Attends Religious Services: More than 4 times per year   Active Member of [employer] or Organizations: Yes   Attends Music therapist: More than 4 times per year   Marital Status: Married    Review of Systems Per HPI  Objective:  BP 130/70   Pulse 77   Temp 98 F (36.7 C) (Oral)   Ht '5\' 4"'$  (1.626 m)   Wt 225 lb 12.8 oz (102.4 kg)   LMP 05/24/2010  SpO2 95%   BMI 38.76 kg/m      08/10/2021   10:16 AM 08/10/2021    8:16 AM 07/05/2021    9:08 AM  BP/Weight  Systolic BP 734  287  Diastolic BP 70  82  Wt. (Lbs) 225.8 225 225.8  BMI 38.76 kg/m2 38.62 kg/m2 38.76 kg/m2    Physical Exam Constitutional:      General: She is not in acute distress.    Appearance: Normal appearance.  HENT:     Head: Normocephalic and atraumatic.  Cardiovascular:     Rate and Rhythm: Normal rate and regular rhythm.  Pulmonary:     Effort: Pulmonary effort is normal.     Breath sounds: Normal breath sounds. No wheezing or rales.  Abdominal:     General: There is no distension.     Palpations: Abdomen is soft.      Tenderness: There is no abdominal tenderness. There is no right CVA tenderness or left CVA tenderness.  Neurological:     Mental Status: She is alert.    Lab Results  Component Value Date   WBC 5.2 02/24/2021   HGB 14.2 02/24/2021   HCT 43.0 02/24/2021   PLT 156 02/24/2021   GLUCOSE 79 02/24/2021   CHOL 180 02/24/2021   TRIG 121 02/24/2021   HDL 37 (L) 02/24/2021   LDLCALC 121 (H) 02/24/2021   ALT 18 02/24/2021   AST 19 02/24/2021   NA 143 02/24/2021   K 4.8 02/24/2021   CL 101 02/24/2021   CREATININE 0.83 02/24/2021   BUN 12 02/24/2021   CO2 28 02/24/2021   TSH 1.990 02/24/2021   INR 1.10 11/10/2014   HGBA1C 5.3 02/24/2021     Assessment & Plan:   Problem List Items Addressed This Visit       Genitourinary   UTI (urinary tract infection) - Primary    Trace leukocytes on urinalysis.  Given symptoms and the fact that she has leukocytes on urinalysis, I am placing her empirically on Keflex while awaiting culture.       Relevant Medications   cephALEXin (KEFLEX) 500 MG capsule   Other Visit Diagnoses     Dysuria       Relevant Orders   POCT urinalysis dipstick (Completed)   Urine Culture       Meds ordered this encounter  Medications   cephALEXin (KEFLEX) 500 MG capsule    Sig: Take 1 capsule (500 mg total) by mouth 2 (two) times daily.    Dispense:  14 capsule    Refill:  [facility]

## 2021-08-10 NOTE — Patient Instructions (Signed)
Medication as prescribed. ° °Call with concerns. ° °Take care ° °Dr Ladajah Soltys °

## 2021-08-10 NOTE — Progress Notes (Signed)
Subjective:   Karen Vazquez is a 66 y.o. female who presents for an Initial Medicare Annual Wellness Visit. Virtual Visit via Telephone Note  I connected with  Karen Vazquez on [birth year] at  8:15 AM EDT by telephone and verified that I am speaking with the correct person using two identifiers.  Location: Patient: HOME Provider: [facility] Persons participating in the virtual visit: patient/Nurse Health Advisor   I discussed the limitations, risks, security and privacy concerns of performing an evaluation and management service by telephone and the availability of in person appointments. The patient expressed understanding and agreed to proceed.  Interactive audio and video telecommunications were attempted between this nurse and patient, however failed, due to patient having technical difficulties OR patient did not have access to video capability.  We continued and completed visit with audio only.  Some vital signs may be absent or patient reported.   Karen Driver, LPN  Review of Systems     Cardiac Risk Factors include: advanced age (>29mn, >>67women);hypertension;sedentary lifestyle;obesity (BMI >30kg/m2)     Objective:    Today's Vitals   08/10/21 0816  Weight: 225 lb (102.1 kg)  Height: '5\' 4"'$  (1.626 m)   Body mass index is 38.62 kg/m.     08/10/2021    8:24 AM 08/25/2020    7:53 AM 05/15/2019    8:11 PM 05/15/2019    4:20 PM 01/19/2018    3:18 PM 03/03/2016   12:24 PM 12/19/2014    8:03 AM  Advanced Directives  Does Patient Have a Medical Advance Directive? Yes No Yes No No No No  Type of Advance Directive Healthcare Power of [place]     Does patient want to make changes to medical advance directive?   No - Patient declined      Copy of HUmatillain Chart? No - copy requested        Would patient like information on creating a medical advance directive? No - Patient declined No - Patient declined No - Patient  declined No - Patient declined  No - Patient declined No - patient declined information    Current Medications (verified) Outpatient Encounter Medications as of 08/10/2021  Medication Sig   albuterol (VENTOLIN HFA) 108 (90 Base) MCG/ACT inhaler Inhale 2 puffs into the lungs every 6 (six) hours as needed for wheezing or shortness of breath.   ALPRAZolam (XANAX) 0.5 MG tablet TAKE ONE TABLET BY MOUTH TWO TIMES DAILYAS NEEDED FOR ANXIETY OR SLEEP.   amLODipine (NORVASC) 2.5 MG tablet Take 1 tablet (2.5 mg total) by mouth daily.   ARMOUR THYROID 15 MG tablet TAKE ONE TABLET ONCE DAILY   Cholecalciferol (VITAMIN D) 50 MCG (2000 UT) CAPS Take by mouth.   fluticasone (FLONASE) 50 MCG/ACT nasal spray Place 2 sprays into both nostrils daily.   hydrOXYzine (ATARAX) 25 MG tablet Take 1 tablet (25 mg total) by mouth at bedtime as needed (Insomnia).   metFORMIN (GLUCOPHAGE) 500 MG tablet Take 1 tablet (500 mg total) by mouth 2 (two) times daily with a meal.   OVER THE COUNTER MEDICATION Vitamin b12   oxybutynin (DITROPAN-XL) 5 MG 24 hr tablet Take by mouth.   pantoprazole (PROTONIX) 40 MG tablet Take 1 tablet (40 mg total) by mouth daily.   Pediatric Multiple Vit-C-FA (FLINSTONES GUMMIES OMEGA-3 DHA) CHEW Chew 18 mg by mouth daily.   pregabalin (LYRICA) 50 MG capsule 1 each evening  thyroid (ARMOUR THYROID) 30 MG tablet TAKE ONE (1) TABLET BY MOUTH EVERY DAY   No facility-administered encounter medications on file as of 08/10/2021.    Allergies (verified) Lisinopril   History: Past Medical History:  Diagnosis Date   Anemia    Anxiety    Arthritis    Back pain    Blood transfusion 2011   at [facility]   Bulging lumbar disc    Chest pain    Depression    Dry mouth    Easy bruising    Excessive thirst    Fatigue    Frequent urination    GERD (gastroesophageal reflux disease)    Headache    Heat intolerance    Hypertension    Hypothyroidism    Joint pain    Leg pain    Muscle stiffness     Nervousness    Palpitations    Pre-diabetes    Restless leg syndrome    Shortness of breath on exertion    Sleep apnea    does not use c-pap machine   Stomach ulcer    Stress    Swelling of both lower extremities    Trouble in sleeping    Vitamin D deficiency    Weakness    Past Surgical History:  Procedure Laterality Date   APPENDECTOMY     BIOPSY  05/16/2019   Procedure: BIOPSY;  Surgeon: Rogene [place], MD;  [facility];  Service: Endoscopy;;  duodenum antral   BLADDER SUSPENSION  12/29/2010   Procedure: TRANSVAGINAL TAPE (TVT) PROCEDURE;  Surgeon: Daria Pastures;  Location: [facility];  Service: Gynecology;  Laterality: N/A;   BREAST SURGERY     left breast lumpectomy 1977   CATARACT EXTRACTION     COLONOSCOPY N/A 03/03/2016   Procedure: COLONOSCOPY;  Surgeon: Rogene [place], MD;  [facility];  Service: Endoscopy;  Laterality: N/A;  2:00 - moved to 12/14 @ 12:55 - Ann notified pt   CYSTOCELE REPAIR  12/29/2010   Procedure: ANTERIOR REPAIR (CYSTOCELE);  Surgeon: Daria Pastures;  Location: [facility];  Service: Gynecology;  Laterality: N/A;   CYSTOSCOPY  12/29/2010   Procedure: CYSTOSCOPY;  Surgeon: Daria Pastures;  Location: [facility];  Service: Gynecology;  Laterality: N/A;   ESOPHAGOGASTRODUODENOSCOPY N/A 05/16/2019   Procedure: ESOPHAGOGASTRODUODENOSCOPY (EGD);  Surgeon: Rogene [place], MD;  [facility];  Service: Endoscopy;  Laterality: N/A;   JOINT REPLACEMENT  2011   rt knee   REPLACEMENT TOTAL KNEE Right 2011   TONSILLECTOMY     TOTAL KNEE ARTHROPLASTY Left 11/17/2014   Procedure: LEFT TOTAL KNEE ARTHROPLASTY;  Surgeon: Gaynelle Arabian, MD;  Location: [facility];  Service: Orthopedics;  Laterality: Left;   TUBAL LIGATION     VAGINAL HYSTERECTOMY  12/29/2010   Procedure: HYSTERECTOMY VAGINAL;  Surgeon: Daria Pastures;  Location: [facility];  Service: Gynecology;  Laterality: N/A;   Family History  Problem Relation Age of Onset    Stroke Mother    Hypertension Mother    Hyperlipidemia Mother    Thyroid disease Mother    Heart disease Father    COPD Father    Cancer Father    Depression Father    Cancer Paternal Grandmother        breast   Diabetes Paternal Grandfather    Social History   Socioeconomic History   Marital status: Married    Spouse name: Jori Moll   Number of children: Not on file  Years of education: Not on file   Highest education level: Not on file  Occupational History   Occupation: Retired  Tobacco Use   Smoking status: Never   Smokeless tobacco: Never  Vaping Use   Vaping Use: Never used  Substance and Sexual Activity   Alcohol use: No   Drug use: No   Sexual activity: Yes    Birth control/protection: Post-menopausal  Other Topics Concern   Not on file  Social History Narrative   Not on file   Social Determinants of Health   Financial Resource Strain: Low Risk    Difficulty of Paying Living Expenses: Not very hard  Food Insecurity: No Food Insecurity   Worried About Running Out of Food in the Last Year: Never true   [place] in the Last Year: Never true  Transportation Needs: No Transportation Needs   Lack of Transportation (Medical): No   Lack of Transportation (Non-Medical): No  Physical Activity: Insufficiently Active   Days of Exercise per Week: 3 days   Minutes of Exercise per Session: 30 min  Stress: Stress Concern Present   Feeling of Stress : To some extent  Social Connections: Engineer, building services of Communication with Friends and Family: More than three times a week   Frequency of Social Gatherings with Friends and Family: More than three times a week   Attends Religious Services: More than 4 times per year   Active Member of [employer] or Organizations: Yes   Attends Music therapist: More than 4 times per year   Marital Status: Married    Tobacco Counseling Counseling given: Not Answered   Clinical Intake:  Pre-visit  preparation completed: Yes  Pain : No/denies pain     BMI - recorded: 38.62 Nutritional Status: BMI > 30  Obese Nutritional Risks: None Diabetes: No  How often do you need to have someone help you when you read instructions, pamphlets, or other written materials from your doctor or pharmacy?: 1 - Never  Diabetic?NO  Interpreter Needed?: No  Information entered by :: mj Vennie Waymire, lpn   Activities of Daily Living    08/10/2021    8:29 AM 08/10/2021    8:28 AM  In your present state of health, do you have any difficulty performing the following activities:  Hearing? 0 0  Vision? 0 0  Difficulty concentrating or making decisions? 0 0  Walking or climbing stairs? 0 0  Dressing or bathing? 0 0  Doing errands, shopping? 0 0  Preparing Food and eating ? N N  Using the Toilet? N N  In the past six months, have you accidently leaked urine? Y Y  Comment on Ditropan.   Do you have problems with loss of bowel control? Y Y  Comment at times. at times  Managing your Medications? N N  Managing your Finances? N N  Housekeeping or managing your Housekeeping? N N    Patient Care Team: Kathyrn Drown, MD as PCP - General (Family Medicine)  Indicate any recent Medical Services you may have received from other than [facility] providers in the past year (date may be approximate).     Assessment:   This is a routine wellness examination for Ziare.  Hearing/Vision screen Hearing Screening - Comments:: No hearing issues.  Vision Screening - Comments:: Wears glasses, readers. Dr. Susa Simmonds. 07/2021.  Dietary issues and exercise activities discussed: Current Exercise Habits: Home exercise routine, Type of exercise: walking, Time (Minutes): 30, Frequency (Times/Week): 3,  Weekly Exercise (Minutes/Week): 90, Intensity: Mild, Exercise limited by: cardiac condition(s);orthopedic condition(s)   Goals Addressed             This Visit's Progress    Exercise 3x per week (30 min per time)        Encouraged more movement.       Depression Screen    08/10/2021    8:21 AM 01/06/2021   10:55 AM 08/20/2020    7:45 AM 02/11/2020    9:24 AM 07/20/2017    9:03 AM 07/03/2017    8:17 AM  PHQ 2/9 Scores  PHQ - 2 Score 0 0 3 0 6 2  PHQ- 9 Score   10  26     Fall Risk    08/10/2021    8:26 AM 08/06/2021    1:41 PM 01/06/2021   10:55 AM 01/02/2019    5:03 PM  [place] in the past year? 0 0 0 0  Number falls in past yr: 0 0 0   Injury with Fall? 0 0 0   Risk for fall due to : No Fall Risks  No Fall Risks   Follow up Falls prevention discussed  Falls evaluation completed Falls evaluation completed    [place]:  Any stairs in or around the home? Yes  If so, are there any without handrails? No  Home free of loose throw rugs in walkways, pet beds, electrical cords, etc? Yes  Adequate lighting in your home to reduce risk of falls? Yes   ASSISTIVE DEVICES UTILIZED TO PREVENT FALLS:  Life alert? No  Use of a cane, walker or w/c? No  Grab bars in the bathroom? Yes  Shower chair or bench in shower? No  Elevated toilet seat or a handicapped toilet? Yes   TIMED UP AND GO:  Was the test performed? No .  Phone visit.  Cognitive Function:        08/10/2021    8:30 AM  6CIT Screen  What Year? 0 points  What month? 0 points  What time? 0 points  Count back from 20 0 points  Months in reverse 0 points  Repeat phrase 0 points  Total Score 0 points    Immunizations Immunization History  Administered Date(s) Administered   Influenza Split 12/12/2012   Influenza,inj,Quad PF,6+ Mos 01/02/2017, 01/01/2018   Influenza,inj,quad, With Preservative 11/19/2016   Influenza-Unspecified 12/20/2014, 12/28/2015, 12/19/2017, 01/31/2018, 12/12/2018, 01/16/2020   Moderna Covid Bivalent Peds Booster(21moThru 547yr 01/29/2021   Moderna Sars-Covid-2 Vaccination 04/02/2019, 04/25/2019, 03/05/2020   Pneumococcal Polysaccharide-23 07/21/2020   Tdap  01/01/2018   Zoster Recombinat (Shingrix) 01/31/2018, 07/31/2020    TDAP status: Up to date  Flu Vaccine status: Due, Education has been provided regarding the importance of this vaccine. Advised may receive this vaccine at local pharmacy or Health Dept. Aware to provide a copy of the vaccination record if obtained from local pharmacy or Health Dept. Verbalized acceptance and understanding.  Pneumococcal vaccine status: Up to date  Covid-19 vaccine status: Completed vaccines  Qualifies for Shingles Vaccine? Yes   Zostavax completed Yes   Shingrix Completed?: Yes  Screening Tests Health Maintenance  Topic Date Due   URINE MICROALBUMIN  Never done   COLONOSCOPY (Pts 45-4938yrnsurance coverage will need to be confirmed)  03/03/2021   MAMMOGRAM  07/31/2021   Pneumonia Vaccine 65+73ears old (2 - PCV) 07/21/2021   INFLUENZA VACCINE  10/19/2021   TETANUS/TDAP  01/02/2028  DEXA SCAN  Completed   COVID-19 Vaccine  Completed   Hepatitis C Screening  Completed   Zoster Vaccines- Shingrix  Completed   HPV VACCINES  Aged Out    Health Maintenance  Health Maintenance Due  Topic Date Due   URINE MICROALBUMIN  Never done   COLONOSCOPY (Pts 45-56yr Insurance coverage will need to be confirmed)  03/03/2021   MAMMOGRAM  07/31/2021   Pneumonia Vaccine 66 Years old (2 - PCV) 07/21/2021    Colorectal cancer screening: Type of screening: Colonoscopy. Completed 03/03/2016. Repeat every 7 years  Mammogram status: Completed 08/01/2019. Repeat every year  Bone Density status: Completed 07/29/2020. Results reflect: Bone density results: OSTEOPENIA. Repeat every 2 years.  Lung Cancer Screening: (Low Dose CT Chest recommended if Age 60109-80years, 30 pack-year currently smoking OR have quit w/in 15years.) does not qualify.   Additional Screening:  Hepatitis C Screening: does qualify; Completed 01/10/2018  Vision Screening: Recommended annual ophthalmology exams for early detection of glaucoma  and other disorders of the eye. Is the patient up to date with their annual eye exam?  Yes  Who is the provider or what is the name of the office in which the patient attends annual eye exams? Dr. GSusa SimmondsIf pt is not established with a provider, would they like to be referred to a provider to establish care? No .   Dental Screening: Recommended annual dental exams for proper oral hygiene  Community Resource Referral / Chronic Care Management: CRR required this visit?  No   CCM required this visit?  No      Plan:     I have personally reviewed and noted the following in the patient's chart:   Medical and social history Use of alcohol, tobacco or illicit drugs  Current medications and supplements including opioid prescriptions. Patient is not currently taking opioid prescriptions. Functional ability and status Nutritional status Physical activity Advanced directives List of other physicians Hospitalizations, surgeries, and ER visits in previous 12 months Vitals Screenings to include cognitive, depression, and falls Referrals and appointments  In addition, I have reviewed and discussed with patient certain preventive protocols, quality metrics, and best practice recommendations. A written personalized care plan for preventive services as well as general preventive health recommendations were provided to patient.     MChriss Driver LPN   [id number]  Nurse Notes: Pt has mammogram scheduled for next week. Pt states Colonoscopy follow up is 7 years.

## 2021-08-14 LAB — URINE CULTURE

## 2021-08-19 ENCOUNTER — Ambulatory Visit (INDEPENDENT_AMBULATORY_CARE_PROVIDER_SITE_OTHER): Payer: Medicare Other | Admitting: Family Medicine

## 2021-08-19 ENCOUNTER — Other Ambulatory Visit: Payer: Self-pay | Admitting: Obstetrics and Gynecology

## 2021-08-19 VITALS — BP 114/80 | HR 83 | Temp 97.0°F | Ht 64.0 in | Wt 226.4 lb

## 2021-08-19 DIAGNOSIS — J029 Acute pharyngitis, unspecified: Secondary | ICD-10-CM

## 2021-08-19 DIAGNOSIS — H1031 Unspecified acute conjunctivitis, right eye: Secondary | ICD-10-CM

## 2021-08-19 DIAGNOSIS — R928 Other abnormal and inconclusive findings on diagnostic imaging of breast: Secondary | ICD-10-CM

## 2021-08-19 LAB — POCT RAPID STREP A (OFFICE): Rapid Strep A Screen: NEGATIVE

## 2021-08-19 MED ORDER — CIPROFLOXACIN HCL 0.3 % OP SOLN: OPHTHALMIC | 0 refills | Status: DC

## 2021-08-19 NOTE — Progress Notes (Signed)
   Subjective:    Patient ID: Karen Vazquez, female    DOB: [birth year], 66 y.o.   MRN: [id number]  HPI  Patient's right eye is really red and matted together.   Patient states that is matting together some it started off red and itchy now getting worse denies any visual troubles Sore throat feels like it's on fire and dry cough.  No mucoid drainage no sinus pressure pain Review of Systems     Objective:   Physical Exam Gen-NAD not toxic TMS-normal bilateral T- normal no redness Chest-CTA respiratory rate normal no crackles CV RRR no murmur Skin-warm dry Neuro-grossly normal  Severe conjunctivitis noted in the right eye sclera very red cornea appears normal  Doubt COVID    Assessment & Plan:  Significant conjunctivitis more than likely viral will do antibiotic eyedrops 3 to 5 days but if ongoing troubles follow-up  Also viral respiratory illness do not feel patient needs antibiotic currently follow-up if progressive troubles

## 2021-08-22 LAB — SPECIMEN STATUS REPORT

## 2021-08-22 LAB — CULTURE, GROUP A STREP: Strep A Culture: NEGATIVE

## 2021-08-26 ENCOUNTER — Ambulatory Visit (INDEPENDENT_AMBULATORY_CARE_PROVIDER_SITE_OTHER): Payer: Medicare Other | Admitting: Bariatrics

## 2021-08-26 ENCOUNTER — Encounter (INDEPENDENT_AMBULATORY_CARE_PROVIDER_SITE_OTHER): Payer: Self-pay | Admitting: Bariatrics

## 2021-08-26 VITALS — BP 132/83 | HR 68 | Temp 97.9°F | Ht 64.0 in | Wt 220.0 lb

## 2021-08-26 DIAGNOSIS — E038 Other specified hypothyroidism: Secondary | ICD-10-CM

## 2021-08-26 DIAGNOSIS — R7303 Prediabetes: Secondary | ICD-10-CM | POA: Diagnosis not present

## 2021-08-26 DIAGNOSIS — E669 Obesity, unspecified: Secondary | ICD-10-CM | POA: Diagnosis not present

## 2021-08-26 DIAGNOSIS — Z6837 Body mass index (BMI) 37.0-37.9, adult: Secondary | ICD-10-CM | POA: Diagnosis not present

## 2021-08-26 DIAGNOSIS — Z7984 Long term (current) use of oral hypoglycemic drugs: Secondary | ICD-10-CM

## 2021-08-26 MED ORDER — METFORMIN HCL 500 MG PO TABS: 500.0000 mg | ORAL_TABLET | Freq: Two times a day (BID) | ORAL | 0 refills | Status: DC

## 2021-08-30 ENCOUNTER — Encounter: Payer: Medicare Other | Admitting: Pulmonary Disease

## 2021-08-30 NOTE — Progress Notes (Signed)
Chief Complaint:   OBESITY Karen Vazquez is here to discuss her progress with her obesity treatment plan along with follow-up of her obesity related diagnoses. Karen Vazquez is on the Category 1 Plan and the Category 2 Plan and states she is following her eating plan approximately 50% of the time. Karen Vazquez states she is walking for 30 minutes 3 times per week.  Today's visit was #: 14 Starting weight: 245 lbs Starting date: 08/20/2021 Today's weight: 220 lbs Today's date: 08/26/2021 Total lbs lost to date: 25 lbs Total lbs lost since last in-office visit: 3 lbs  Interim History: Karen Vazquez is down an additional 3 lbs since her lat visit.   Subjective:   1. Pre-diabetes/Insulin resistance Karen Vazquez is currently taking Metformin 500 mg.  2. Other specified hypothyroidism Karen Vazquez is taking Armour thyroid currently.   Assessment/Plan:   1. Pre-diabetes/Insulin resistance Karen Vazquez will continue to work on weight loss, exercise, and decreasing simple carbohydrates to help decrease the risk of diabetes. We will refill Metformin 500 mg for 1 month with no refills.   - metFORMIN (GLUCOPHAGE) 500 MG tablet; Take 1 tablet (500 mg total) by mouth 2 (two) times daily with a meal.  Dispense: 60 tablet; Refill: 0  2. Other specified hypothyroidism Karen Vazquez will continue taking Armour thyroid. Orders and follow up as documented in patient record.  Counseling Good thyroid control is important for overall health. Supratherapeutic thyroid levels are dangerous and will not improve weight loss results. Counseling: The correct way to take levothyroxine is fasting, with water, separated by at least 30 minutes from breakfast, and separated by more than 4 hours from calcium, iron, multivitamins, acid reflux medications (PPIs).    3. Obesity, current with BMI of 19.5 Karen Vazquez is currently in the action stage of change. As such, her goal is to continue with weight loss efforts. She has agreed to the Category 1 Plan and the Category 2 Plan  with breakfast options.   Karen Vazquez will continue meal planning and she will continue intentional eating. She will keep water intake hight. Snack Sheet handout was provided today.   Exercise goals:  As is.   Behavioral modification strategies: increasing lean protein intake, decreasing simple carbohydrates, increasing vegetables, increasing water intake, decreasing eating out, no skipping meals, meal planning and cooking strategies, keeping healthy foods in the home, and planning for success.  Karen Vazquez has agreed to follow-up with our clinic in 3-4 weeks. She was informed of the importance of frequent follow-up visits to maximize her success with intensive lifestyle modifications for her multiple health conditions.   Objective:   Blood pressure 132/83, pulse 68, temperature 97.9 F (36.6 C), height '5\' 4"'$  (1.626 m), weight 220 lb (99.8 kg), last menstrual period 05/24/2010, SpO2 93 %. Body mass index is 37.76 kg/m.  General: Cooperative, alert, well developed, in no acute distress. HEENT: Conjunctivae and lids unremarkable. Cardiovascular: Regular rhythm.  Lungs: Normal work of breathing. Neurologic: No focal deficits.   Lab Results  Component Value Date   CREATININE 0.83 02/24/2021   BUN 12 02/24/2021   NA 143 02/24/2021   K 4.8 02/24/2021   CL 101 02/24/2021   CO2 28 02/24/2021   Lab Results  Component Value Date   ALT 18 02/24/2021   AST 19 02/24/2021   ALKPHOS 116 02/24/2021   BILITOT 0.5 02/24/2021   Lab Results  Component Value Date   HGBA1C 5.3 02/24/2021   HGBA1C 5.3 08/20/2020   HGBA1C 5.3 07/24/2018   HGBA1C 5.4 11/28/2017  HGBA1C 5.2 07/20/2017   Lab Results  Component Value Date   INSULIN 10.1 02/24/2021   INSULIN 19.2 08/20/2020   INSULIN 15.8 07/24/2018   INSULIN 7.8 11/28/2017   INSULIN 12.5 07/20/2017   Lab Results  Component Value Date   TSH 1.990 02/24/2021   Lab Results  Component Value Date   CHOL 180 02/24/2021   HDL 37 (L) 02/24/2021    LDLCALC 121 (H) 02/24/2021   TRIG 121 02/24/2021   CHOLHDL 5.1 (H) 07/20/2020   Lab Results  Component Value Date   VD25OH 43.9 02/24/2021   VD25OH 43.5 08/20/2020   VD25OH 32.1 02/06/2020   Lab Results  Component Value Date   WBC 5.2 02/24/2021   HGB 14.2 02/24/2021   HCT 43.0 02/24/2021   MCV 83 02/24/2021   PLT 156 02/24/2021   Lab Results  Component Value Date   IRON 102 02/24/2021   TIBC 405 02/24/2021   FERRITIN 32 02/24/2021   Attestation Statements:   Reviewed by clinician on day of visit: allergies, medications, problem list, medical history, surgical history, family history, social history, and previous encounter notes.  I, Lizbeth Bark, RMA, am acting as Location manager for [employer], DO.  I have reviewed the above documentation for accuracy and completeness, and I agree with the above. Jearld Lesch, DO

## 2021-08-31 ENCOUNTER — Encounter (INDEPENDENT_AMBULATORY_CARE_PROVIDER_SITE_OTHER): Payer: Self-pay | Admitting: Bariatrics

## 2021-09-06 ENCOUNTER — Ambulatory Visit: Payer: Medicare Other | Attending: Primary Care | Admitting: Pulmonary Disease

## 2021-09-06 ENCOUNTER — Other Ambulatory Visit: Payer: Self-pay | Admitting: Obstetrics and Gynecology

## 2021-09-06 ENCOUNTER — Ambulatory Visit
Admission: RE | Admit: 2021-09-06 | Discharge: 2021-09-06 | Disposition: A | Discharge status: Home / Self Care | Payer: Medicare Other | Source: Ambulatory Visit | Attending: Obstetrics and Gynecology | Admitting: Obstetrics and Gynecology

## 2021-09-06 DIAGNOSIS — N6320 Unspecified lump in the left breast, unspecified quadrant: Secondary | ICD-10-CM

## 2021-09-06 DIAGNOSIS — Z9989 Dependence on other enabling machines and devices: Secondary | ICD-10-CM | POA: Diagnosis not present

## 2021-09-06 DIAGNOSIS — G4733 Obstructive sleep apnea (adult) (pediatric): Secondary | ICD-10-CM | POA: Diagnosis present

## 2021-09-06 DIAGNOSIS — R928 Other abnormal and inconclusive findings on diagnostic imaging of breast: Secondary | ICD-10-CM

## 2021-09-07 ENCOUNTER — Ambulatory Visit
Admission: RE | Admit: 2021-09-07 | Discharge: 2021-09-07 | Disposition: A | Discharge status: Home / Self Care | Payer: Medicare Other | Source: Ambulatory Visit | Attending: Obstetrics and Gynecology | Admitting: Obstetrics and Gynecology

## 2021-09-07 ENCOUNTER — Encounter: Payer: Self-pay | Admitting: Family Medicine

## 2021-09-07 DIAGNOSIS — N6320 Unspecified lump in the left breast, unspecified quadrant: Secondary | ICD-10-CM

## 2021-09-07 LAB — HM MAMMOGRAPHY: HM Mammogram: ABNORMAL — AB (ref 0–4)

## 2021-09-09 ENCOUNTER — Encounter (INDEPENDENT_AMBULATORY_CARE_PROVIDER_SITE_OTHER): Payer: Self-pay | Admitting: Family Medicine

## 2021-09-09 ENCOUNTER — Encounter: Payer: Self-pay | Admitting: *Deleted

## 2021-09-09 ENCOUNTER — Telehealth: Payer: Self-pay | Admitting: Hematology and Oncology

## 2021-09-09 DIAGNOSIS — Z170 Estrogen receptor positive status [ER+]: Secondary | ICD-10-CM | POA: Insufficient documentation

## 2021-09-09 NOTE — Telephone Encounter (Signed)
FYI

## 2021-09-09 NOTE — Telephone Encounter (Signed)
Please advise 

## 2021-09-09 NOTE — Telephone Encounter (Signed)
Spoke to patient to confirm afternoon clinic appointment for 6/28, will send packet via e-mail

## 2021-09-13 DIAGNOSIS — G4733 Obstructive sleep apnea (adult) (pediatric): Secondary | ICD-10-CM | POA: Diagnosis not present

## 2021-09-14 NOTE — Progress Notes (Signed)
[place] CONSULT NOTE  Patient Care Team: Kathyrn Drown, MD as PCP - General (Family Medicine) Rockwell Germany, RN as Oncology Nurse Navigator Mauro Kaufmann, RN as Oncology Nurse Navigator Erroll Luna, MD as Consulting Physician (General Surgery) Nicholas Lose, MD as Consulting Physician (Hematology and Oncology) Eppie Gibson, MD as Attending Physician (Radiation Oncology)  CHIEF COMPLAINTS/PURPOSE OF CONSULTATION:  Newly diagnosed breast cancer  HISTORY OF PRESENTING ILLNESS:  Karen Vazquez 66 y.o. female is here because of recent diagnosis of left breast mass Screening mammogram on 6/192023 detected left breast mass Diagnostic mammogram on 09/07/2021 showed Appropriate positioning of the ribbon shaped biopsy marking clip at the site of biopsy in the left upper outer quadrant. Biopsy on the date of 09/07/21 showed: Prognostic indicators significant for ER 90% positive; PR 0% negative, Her2 status (3+); Grade KI67.  She was presented at the multidisciplinary tumor board and she is here at the [facility] clinic to discuss her treatment plan.    I reviewed her records extensively and collaborated the history with the patient.  SUMMARY OF ONCOLOGIC HISTORY: Oncology History  Malignant neoplasm of upper-outer quadrant of left breast in female, estrogen receptor positive ([place])  09/07/2021 Initial Diagnosis   Screening mammogram detected left breast mass by ultrasound 2 o'clock position measured 1.4 cm axilla was negative.  Ultrasound-guided biopsy revealed grade 2 IDC with focal necrosis ER 90% weak, PR 0%, Ki-67 55%, HER2 3+ positive   09/15/2021 Cancer Staging   Staging form: Breast, AJCC 8th Edition - Clinical: Stage IA (cT1b, cN0, cM0, G3, ER+, PR-, HER2+) - Signed by Nicholas Lose, MD on 09/15/2021 Stage prefix: Initial diagnosis Histologic grading system: 3 grade system      MEDICAL HISTORY:  Past Medical History:  Diagnosis Date   Anemia    Anxiety     Arthritis    Back pain    Blood transfusion 2011   at [facility]   Bulging lumbar disc    Chest pain    Depression    Dry mouth    Easy bruising    Excessive thirst    Fatigue    Frequent urination    GERD (gastroesophageal reflux disease)    Headache    Heat intolerance    Hypertension    Hypothyroidism    Joint pain    Leg pain    Muscle stiffness    Nervousness    Palpitations    Pre-diabetes    Restless leg syndrome    Shortness of breath on exertion    Sleep apnea    does not use c-pap machine   Stomach ulcer    Stress    Swelling of both lower extremities    Trouble in sleeping    Vitamin D deficiency    Weakness     SURGICAL HISTORY: Past Surgical History:  Procedure Laterality Date   APPENDECTOMY     BIOPSY  05/16/2019   Procedure: BIOPSY;  Surgeon: Rogene Houston, MD;  [facility]: [facility];  Service: Endoscopy;;  duodenum antral   BLADDER SUSPENSION  12/29/2010   Procedure: TRANSVAGINAL TAPE (TVT) PROCEDURE;  Surgeon: Daria Pastures;  Location: [facility];  Service: Gynecology;  Laterality: N/A;   BREAST SURGERY     left breast lumpectomy 1977   CATARACT EXTRACTION     COLONOSCOPY N/A 03/03/2016   Procedure: COLONOSCOPY;  Surgeon: Rogene Houston, MD;  Location: [facility];  Service: Endoscopy;  Laterality: N/A;  2:00 -  moved to 12/14 @ 12:55 - Ann notified pt   CYSTOCELE REPAIR  12/29/2010   Procedure: ANTERIOR REPAIR (CYSTOCELE);  Surgeon: Daria Pastures;  Location: [facility];  Service: Gynecology;  Laterality: N/A;   CYSTOSCOPY  12/29/2010   Procedure: CYSTOSCOPY;  Surgeon: Daria Pastures;  Location: [facility];  Service: Gynecology;  Laterality: N/A;   ESOPHAGOGASTRODUODENOSCOPY N/A 05/16/2019   Procedure: ESOPHAGOGASTRODUODENOSCOPY (EGD);  Surgeon: Rogene Houston, MD;  Location: [facility];  Service: Endoscopy;  Laterality: N/A;   JOINT REPLACEMENT  2011   rt knee   REPLACEMENT TOTAL KNEE Right 2011   TONSILLECTOMY     TOTAL KNEE  ARTHROPLASTY Left 11/17/2014   Procedure: LEFT TOTAL KNEE ARTHROPLASTY;  Surgeon: Gaynelle Arabian, MD;  Location: [facility];  Service: Orthopedics;  Laterality: Left;   TUBAL LIGATION     VAGINAL HYSTERECTOMY  12/29/2010   Procedure: HYSTERECTOMY VAGINAL;  Surgeon: Daria Pastures;  Location: [facility];  Service: Gynecology;  Laterality: N/A;    SOCIAL HISTORY: Social History   Socioeconomic History   Marital status: Married    Spouse name: Jori Moll   Number of children: Not on file   Years of education: Not on file   Highest education level: Not on file  Occupational History   Occupation: Retired  Tobacco Use   Smoking status: Never   Smokeless tobacco: Never  Vaping Use   Vaping Use: Never used  Substance and Sexual Activity   Alcohol use: No   Drug use: No   Sexual activity: Yes    Birth control/protection: Post-menopausal  Other Topics Concern   Not on file  Social History Narrative   Not on file   Social Determinants of Health   Financial Resource Strain: Low Risk  (08/10/2021)   Overall Financial Resource Strain (CARDIA)    Difficulty of Paying Living Expenses: Not very hard  Food Insecurity: No Food Insecurity (08/10/2021)   Hunger Vital Sign    Worried About Running Out of Food in the Last Year: Never true    Ran Out of Food in the Last Year: Never true  Transportation Needs: No Transportation Needs (08/10/2021)   PRAPARE - Hydrologist (Medical): No    Lack of Transportation (Non-Medical): No  Physical Activity: Insufficiently Active (08/10/2021)   Exercise Vital Sign    Days of Exercise per Week: 3 days    Minutes of Exercise per Session: 30 min  Stress: Stress Concern Present (08/10/2021)   [place]    Feeling of Stress : To some extent  Social Connections: Socially Integrated (08/10/2021)   Social Connection and Isolation Panel [NHANES]    Frequency of Communication  with Friends and Family: More than three times a week    Frequency of Social Gatherings with Friends and Family: More than three times a week    Attends Religious Services: More than 4 times per year    Active Member of [employer] or Organizations: Yes    Attends Archivist Meetings: More than 4 times per year    Marital Status: Married  Human resources officer Violence: Not At Risk (08/10/2021)   Humiliation, Afraid, Rape, and Kick questionnaire    Fear of Current or Ex-Partner: No    Emotionally Abused: No    Physically Abused: No    Sexually Abused: No    FAMILY HISTORY: Family History  Problem Relation Age of Onset   Stroke Mother  Hypertension Mother    Hyperlipidemia Mother    Thyroid disease Mother    Heart disease Father    COPD Father    Cancer Father    Depression Father    Prostate cancer Father    Cancer Paternal Grandmother        breast   Diabetes Paternal Grandfather     ALLERGIES:  is allergic to lisinopril.  MEDICATIONS:  Current Outpatient Medications  Medication Sig Dispense Refill   amLODipine (NORVASC) 2.5 MG tablet Take 1 tablet (2.5 mg total) by mouth daily. 30 tablet 5   ARMOUR THYROID 15 MG tablet TAKE ONE TABLET ONCE DAILY 30 tablet 5   Cholecalciferol (VITAMIN D) 50 MCG (2000 UT) CAPS Take by mouth.     hydrOXYzine (ATARAX) 25 MG tablet Take 1 tablet (25 mg total) by mouth at bedtime as needed (Insomnia). 30 tablet 1   metFORMIN (GLUCOPHAGE) 500 MG tablet Take 1 tablet (500 mg total) by mouth 2 (two) times daily with a meal. 60 tablet 0   OVER THE COUNTER MEDICATION Vitamin b12     oxybutynin (DITROPAN-XL) 5 MG 24 hr tablet Take by mouth.     pantoprazole (PROTONIX) 40 MG tablet Take 1 tablet (40 mg total) by mouth daily. 30 tablet 5   Pediatric Multiple Vit-C-FA (FLINSTONES GUMMIES OMEGA-3 DHA) CHEW Chew 18 mg by mouth daily.     pregabalin (LYRICA) 50 MG capsule 1 each evening 30 capsule 5   thyroid (ARMOUR THYROID) 30 MG tablet TAKE ONE (1)  TABLET BY MOUTH EVERY DAY 30 tablet 5   ALPRAZolam (XANAX) 0.5 MG tablet TAKE ONE TABLET BY MOUTH TWO TIMES DAILYAS NEEDED FOR ANXIETY OR SLEEP. 30 tablet 5   No current facility-administered medications for this visit.    REVIEW OF SYSTEMS:     All other systems were reviewed with the patient and are negative.  PHYSICAL EXAMINATION: ECOG PERFORMANCE STATUS: 1 - Symptomatic but completely ambulatory  Vitals:   09/15/21 1223  BP: (!) 151/80  Pulse: 75  Resp: 18  Temp: 97.7 F (36.5 C)  SpO2: 98%   Filed Weights   09/15/21 1223  Weight: 225 lb (102.1 kg)     LABORATORY DATA:  I have reviewed the data as listed Lab Results  Component Value Date   WBC 6.4 09/15/2021   HGB 14.3 09/15/2021   HCT 44.7 09/15/2021   MCV 85.3 09/15/2021   PLT 160 09/15/2021   Lab Results  Component Value Date   NA 141 09/15/2021   K 4.7 09/15/2021   CL 105 09/15/2021   CO2 32 09/15/2021    RADIOGRAPHIC STUDIES: I have personally reviewed the radiological reports and agreed with the findings in the report.  ASSESSMENT AND PLAN:  Malignant neoplasm of upper-outer quadrant of left breast in female, estrogen receptor positive ([place]) 09/07/2021:Screening mammogram detected left breast mass by ultrasound 2 o'clock position measured 1.4 cm axilla was negative.  Ultrasound-guided biopsy revealed grade 2 IDC with focal necrosis ER 90% weak, PR 0%, Ki-67 55%, HER2 3+ positive  Pathology and radiology counseling: Discussed with the patient, the details of pathology including the type of breast cancer,the clinical staging, the significance of ER, PR and HER-2/neu receptors and the implications for treatment. After reviewing the pathology in detail, we proceeded to discuss the different treatment options between surgery, radiation, chemotherapy, antiestrogen therapies.  Treatment plan: 1.  Breast conserving surgery with sentinel lymph node biopsy 2. adjuvant chemotherapy with Taxol Herceptin weekly x12  followed by Herceptin maintenance every 3 weeks for 1 year 3.  Adjuvant radiation therapy 4.  Adjuvant antiestrogen therapy Genetics consultation  Chemo counseling: Discussed the pros and cons of adjuvant chemotherapy with Taxol and Herceptin.  We discussed the adverse effects of Taxol including peripheral neuropathy cytopenias and allergic reactions LFT changes as well as risk of bone marrow dysfunction.  We also discussed the Herceptin related adverse effects including decreased cardiac ejection fraction.  Return to clinic after surgery to discuss final pathology report.   All questions were answered. The patient knows to call the clinic with any problems, questions or concerns.    Harriette Ohara, MD 09/15/21  I Gardiner Coins am scribing for Dr. Lindi Adie  I have reviewed the above documentation for accuracy and completeness, and I agree with the above.

## 2021-09-14 NOTE — Progress Notes (Signed)
Radiation Oncology         [phone] ________________________________  Initial Outpatient Consultation  Name: Karen Vazquez MRN: [id number]  Date: 09/15/2021  DOB: 02-Apr-1955  QQ:[id number], Elayne Snare, MD  Erroll Luna, MD   REFERRING PHYSICIAN: Erroll Luna, MD  DIAGNOSIS:    ICD-10-CM   1. Malignant neoplasm of upper-outer quadrant of left breast in female, estrogen receptor positive ([place])  C50.412    Z17.0      Left Breast UOQ, Invasive Ductal Carcinoma, ER+ / PR- / Her2+, Grade 2   Cancer Staging  Malignant neoplasm of upper-outer quadrant of left breast in female, estrogen receptor positive ([place]) Staging form: Breast, AJCC 8th Edition - Clinical: Stage IA (cT1b, cN0, cM0, G3, ER+, PR-, HER2+) - Signed by Nicholas Lose, MD on 09/15/2021   CHIEF COMPLAINT: Here to discuss management of left breast cancer  HISTORY OF PRESENT ILLNESS::Karen Vazquez is a 66 y.o. female who presented with a left breast abnormality on the following imaging: bilateral screening mammogram on the date of 08/17/21.  No symptoms, if any, were reported at that time.   Ultrasound of the left breast on 09/06/21 further revealed a suspicious mass in the 2 o'clock left breast measuring 1.4 cm. No left axillary adenopathy was appreciated.  Biopsy of the 2 o'clock left breast, 6 cmfn, on the date of 09/07/21 showed grade 2 invasive ductal carcinoma measuring 13 mm in the greatest linear dimension, with focal necrosis (negative for LVI).  ER status: 90% positive with weak staining intensity; PR status 0% negative; Proliferation marker Ki67 at 55%; Her2 status positive; Grade 2. NO lymph nodes were examined.   She is in her USOH. She had a left lumpectomy in 1977 (benign tissue). She has a known heart murmur.  PREVIOUS RADIATION THERAPY: No  PAST MEDICAL HISTORY:  has a past medical history of Anemia, Anxiety, Arthritis, Back pain, Blood transfusion (2011), Bulging lumbar disc, Chest pain, Depression, Dry  mouth, Easy bruising, Excessive thirst, Fatigue, Frequent urination, GERD (gastroesophageal reflux disease), Headache, Heat intolerance, Hypertension, Hypothyroidism, Joint pain, Leg pain, Muscle stiffness, Nervousness, Palpitations, Pre-diabetes, Restless leg syndrome, Shortness of breath on exertion, Sleep apnea, Stomach ulcer, Stress, Swelling of both lower extremities, Trouble in sleeping, Vitamin D deficiency, and Weakness.    PAST SURGICAL HISTORY: Past Surgical History:  Procedure Laterality Date   APPENDECTOMY     BIOPSY  05/16/2019   Procedure: BIOPSY;  Surgeon: Rogene Houston, MD;  [facility]: [facility];  Service: Endoscopy;;  duodenum antral   BLADDER SUSPENSION  12/29/2010   Procedure: TRANSVAGINAL TAPE (TVT) PROCEDURE;  Surgeon: Daria Pastures;  Location: [facility];  Service: Gynecology;  Laterality: N/A;   BREAST SURGERY     left breast lumpectomy 1977   CATARACT EXTRACTION     COLONOSCOPY N/A 03/03/2016   Procedure: COLONOSCOPY;  Surgeon: Rogene Houston, MD;  [facility]: [facility];  Service: Endoscopy;  Laterality: N/A;  2:00 - moved to 12/14 @ 12:55 - Ann notified pt   CYSTOCELE REPAIR  12/29/2010   Procedure: ANTERIOR REPAIR (CYSTOCELE);  Surgeon: Daria Pastures;  Location: [facility];  Service: Gynecology;  Laterality: N/A;   CYSTOSCOPY  12/29/2010   Procedure: CYSTOSCOPY;  Surgeon: Daria Pastures;  Location: [facility];  Service: Gynecology;  Laterality: N/A;   ESOPHAGOGASTRODUODENOSCOPY N/A 05/16/2019   Procedure: ESOPHAGOGASTRODUODENOSCOPY (EGD);  Surgeon: Rogene Houston, MD;  [facility]: [facility];  Service: Endoscopy;  Laterality: N/A;   JOINT REPLACEMENT  2011  rt knee   REPLACEMENT TOTAL KNEE Right 2011   TONSILLECTOMY     TOTAL KNEE ARTHROPLASTY Left 11/17/2014   Procedure: LEFT TOTAL KNEE ARTHROPLASTY;  Surgeon: Gaynelle Arabian, MD;  Location: [facility];  Service: Orthopedics;  Laterality: Left;   TUBAL LIGATION     VAGINAL HYSTERECTOMY  12/29/2010    Procedure: HYSTERECTOMY VAGINAL;  Surgeon: Daria Pastures;  Location: [facility];  Service: Gynecology;  Laterality: N/A;    FAMILY HISTORY: family history includes COPD in her father; Cancer in her father and paternal grandmother; Depression in her father; Diabetes in her paternal grandfather; Heart disease in her father; Hyperlipidemia in her mother; Hypertension in her mother; Prostate cancer in her father; Stroke in her mother; Thyroid disease in her mother.  SOCIAL HISTORY:  reports that she has never smoked. She has never used smokeless tobacco. She reports that she does not drink alcohol and does not use drugs.  ALLERGIES: Lisinopril  MEDICATIONS:  Current Outpatient Medications  Medication Sig Dispense Refill   ALPRAZolam (XANAX) 0.5 MG tablet TAKE ONE TABLET BY MOUTH TWO TIMES DAILYAS NEEDED FOR ANXIETY OR SLEEP. 30 tablet 5   amLODipine (NORVASC) 2.5 MG tablet Take 1 tablet (2.5 mg total) by mouth daily. 30 tablet 5   ARMOUR THYROID 15 MG tablet TAKE ONE TABLET ONCE DAILY 30 tablet 5   Cholecalciferol (VITAMIN D) 50 MCG (2000 UT) CAPS Take by mouth.     hydrOXYzine (ATARAX) 25 MG tablet Take 1 tablet (25 mg total) by mouth at bedtime as needed (Insomnia). 30 tablet 1   metFORMIN (GLUCOPHAGE) 500 MG tablet Take 1 tablet (500 mg total) by mouth 2 (two) times daily with a meal. 60 tablet 0   OVER THE COUNTER MEDICATION Vitamin b12     oxybutynin (DITROPAN-XL) 5 MG 24 hr tablet Take by mouth.     pantoprazole (PROTONIX) 40 MG tablet Take 1 tablet (40 mg total) by mouth daily. 30 tablet 5   Pediatric Multiple Vit-C-FA (FLINSTONES GUMMIES OMEGA-3 DHA) CHEW Chew 18 mg by mouth daily.     pregabalin (LYRICA) 50 MG capsule 1 each evening 30 capsule 5   thyroid (ARMOUR THYROID) 30 MG tablet TAKE ONE (1) TABLET BY MOUTH EVERY DAY 30 tablet 5   No current facility-administered medications for this encounter.    REVIEW OF SYSTEMS: As above in HPI.   PHYSICAL EXAM:  vitals were not taken  for this visit.   General: Alert and oriented, in no acute distress HEENT: Head is normocephalic. Extraocular movements are intact.   Heart: soft systolic murmur, RRR. Chest: Clear to auscultation bilaterally, with no rhonchi, wheezes, or rales. Extremities: No cyanosis or edema. Skin: No concerning lesions. Musculoskeletal: symmetric strength and muscle tone throughout. Neurologic: Cranial nerves II through XII are grossly intact. No obvious focalities. Speech is fluent. Coordination is intact. Psychiatric: Judgment and insight are intact. Affect is appropriate. Breasts: no masses appreciated in the breasts or axillae b/l .   ECOG = 0  0 - Asymptomatic (Fully active, able to carry on all predisease activities without restriction)  1 - Symptomatic but completely ambulatory (Restricted in physically strenuous activity but ambulatory and able to carry out work of a light or sedentary nature. For example, light housework, office work)  2 - Symptomatic, <50% in bed during the day (Ambulatory and capable of all self care but unable to carry out any work activities. Up and about more than 50% of waking hours)  3 - Symptomatic, >50%  in bed, but not bedbound (Capable of only limited self-care, confined to bed or chair 50% or more of waking hours)  4 - Bedbound (Completely disabled. Cannot carry on any self-care. Totally confined to bed or chair)  5 - Death   Eustace Pen MM, Creech RH, Tormey DC, et al. [phone]). "Toxicity and response criteria of the [facility]". [place] Oncol. 5 (6): 649-55   LABORATORY DATA:  Lab Results  Component Value Date   WBC 6.4 09/15/2021   HGB 14.3 09/15/2021   HCT 44.7 09/15/2021   MCV 85.3 09/15/2021   PLT 160 09/15/2021   CMP     Component Value Date/Time   NA 141 09/15/2021 1158   NA 143 02/24/2021 1018   K 4.7 09/15/2021 1158   CL 105 09/15/2021 1158   CO2 32 09/15/2021 1158   GLUCOSE 93 09/15/2021 1158   BUN 14 09/15/2021  1158   BUN 12 02/24/2021 1018   CREATININE 1.04 (H) 09/15/2021 1158   CREATININE 0.89 12/12/2012 0813   CALCIUM 10.0 09/15/2021 1158   PROT 7.2 09/15/2021 1158   PROT 6.8 02/24/2021 1018   ALBUMIN 4.4 09/15/2021 1158   ALBUMIN 4.6 02/24/2021 1018   AST 19 09/15/2021 1158   ALT 14 09/15/2021 1158   ALKPHOS 105 09/15/2021 1158   BILITOT 0.5 09/15/2021 1158   GFRNONAA 59 (L) 09/15/2021 1158   GFRAA 76 02/06/2020 1054         RADIOGRAPHY:   I personally reviewed her imaging   [place] LT BREAST BX W LOC DEV 1ST LESION IMG BX SPEC US GUIDE  Addendum Date: 09/09/2021   ADDENDUM REPORT: 09/09/2021 07:58 ADDENDUM: Pathology revealed GRADE II INVASIVE DUCTAL CARCINOMA with FOCAL NECROSIS of the LEFT breast, 2:00 o'clock, 6 cmfn, (ribbon clip). This was found to be concordant by Dr. Marin Olp. Pathology results were discussed with the patient by telephone. The patient reported doing well after the biopsy with tenderness at the site. Post biopsy instructions and care were reviewed and questions were answered. The patient was encouraged to call [facility] for any additional concerns. My direct phone number was provided. The patient was referred to [facility] on September 15, 2021. Pathology results reported by Terie Purser, RN on 09/08/2021. Electronically Signed   By: Marin Olp M.D.   On: 09/09/2021 07:58   Result Date: 09/09/2021 CLINICAL DATA:  Patient presents for ultrasound-guided core needle biopsy of a 1.4 cm irregular mass over the 2 o'clock position of the left breast 6 cm from the nipple. EXAM: ULTRASOUND GUIDED LEFT BREAST CORE NEEDLE BIOPSY COMPARISON:  None Available. PROCEDURE: I met with the patient and we discussed the procedure of ultrasound-guided biopsy, including benefits and alternatives. We discussed the high likelihood of a successful procedure. We discussed the risks of the  procedure, including infection, bleeding, tissue injury, clip migration, and inadequate sampling. Informed written consent was given. The usual time-out protocol was performed immediately prior to the procedure. Lesion quadrant: Left upper outer quadrant. Using sterile technique and 1% Lidocaine as local anesthetic, under direct ultrasound visualization, a 12 gauge spring-loaded device was used to perform biopsy of the targeted mass at the 2 o'clock position using a inferior to superior approach. At the conclusion of the procedure a ribbon shaped tissue marker clip was deployed into the biopsy cavity. Follow up 2 view mammogram was performed and dictated separately. IMPRESSION: Ultrasound guided  biopsy of an irregular 1.4 cm left breast mass. No apparent complications. Electronically Signed: By: Marin Olp M.D. On: 09/07/2021 14:27  SLEEP STUDY DOCUMENTS  Result Date: 09/08/2021 Ordered by an unspecified provider.  MM CLIP PLACEMENT LEFT  Result Date: 09/07/2021 CLINICAL DATA:  Patient is post ultrasound-guided core needle biopsy of an irregular 1.4 cm mass over the 2 o'clock position left breast. EXAM: 3D DIAGNOSTIC LEFT MAMMOGRAM POST ULTRASOUND BIOPSY COMPARISON:  Previous exam(s). FINDINGS: 3D Mammographic images were obtained following ultrasound guided biopsy of the targeted mass over the 2 o'clock position of the left breast. The biopsy marking clip is in expected position at the site of biopsy. IMPRESSION: Appropriate positioning of the ribbon shaped biopsy marking clip at the site of biopsy in the left upper outer quadrant. Final Assessment: Post Procedure Mammograms for Marker Placement Electronically Signed   By: Marin Olp M.D.   On: 09/07/2021 14:39  US BREAST LTD UNI LEFT INC AXILLA  Result Date: 09/06/2021 CLINICAL DATA:  66 year old female presenting as a recall from screening for possible left breast mass. EXAM: ULTRASOUND OF THE LEFT BREAST COMPARISON:  Previous exams. FINDINGS:  Targeted ultrasound is performed in the left breast at 2 o'clock 6 cm from the nipple demonstrating an irregular hypoechoic mass measuring 1.4 x 0.9 x 1.0 cm. This corresponds to the mammographic finding. Targeted ultrasound of the left axilla demonstrates normal lymph nodes. IMPRESSION: Suspicious mass in the left breast at 2 o'clock measuring 1.4 cm. RECOMMENDATION: Ultrasound-guided core needle biopsy x1 of the left breast. I have discussed the findings and recommendations with the patient who agrees to proceed with biopsy. The patient will be scheduled for the biopsy appointment prior to leaving the office today. BI-RADS CATEGORY  4: Suspicious. Electronically Signed   By: Audie Pinto M.D.   On: 09/06/2021 14:38     IMPRESSION/PLAN: Left breast cancer  She anticipates lumpectomy, then chemotherapy. I recommend radiation to the left breat to follow her chemotherapy.   It was a pleasure meeting the patient today. We discussed the risks, benefits, and side effects of radiotherapy. I recommend radiotherapy to the left breast to reduce her risk of locoregional recurrence by 2/3.  We discussed that radiation would take approximately 4-6 weeks to complete - this depends on final nodal status. We spoke about acute effects including skin irritation and fatigue as well as much less common late effects including internal organ injury or irritation. We spoke about the latest technology that is used to minimize the risk of late effects for patients undergoing radiotherapy to the breast or chest wall. No guarantees of treatment were given. The patient is enthusiastic about proceeding with treatment. I look forward to participating in the patient's care.  I will await her referral back to me for postoperative follow-up and eventual CT simulation/treatment planning.  On date of service, in total, I spent 45 minutes on this encounter. Patient was seen in person.    __________________________________________   Eppie Gibson, MD  This document serves as a record of services personally performed by Eppie Gibson, MD. It was created on her behalf by Roney Mans, a trained medical scribe. The creation of this record is based on the scribe's personal observations and the provider's statements to them. This document has been checked and approved by the attending provider.

## 2021-09-15 ENCOUNTER — Inpatient Hospital Stay (HOSPITAL_BASED_OUTPATIENT_CLINIC_OR_DEPARTMENT_OTHER): Payer: Medicare Other | Admitting: Genetic Counselor

## 2021-09-15 ENCOUNTER — Inpatient Hospital Stay: Payer: Medicare Other

## 2021-09-15 ENCOUNTER — Encounter: Payer: Self-pay | Admitting: Radiation Oncology

## 2021-09-15 ENCOUNTER — Inpatient Hospital Stay: Payer: Medicare Other | Attending: Hematology and Oncology | Admitting: Hematology and Oncology

## 2021-09-15 ENCOUNTER — Encounter: Payer: Self-pay | Admitting: General Practice

## 2021-09-15 ENCOUNTER — Encounter: Payer: Self-pay | Admitting: *Deleted

## 2021-09-15 ENCOUNTER — Ambulatory Visit: Payer: Self-pay | Admitting: Surgery

## 2021-09-15 ENCOUNTER — Telehealth (INDEPENDENT_AMBULATORY_CARE_PROVIDER_SITE_OTHER): Payer: Federal, State, Local not specified - PPO | Admitting: Family Medicine

## 2021-09-15 ENCOUNTER — Encounter: Payer: Self-pay | Admitting: Genetic Counselor

## 2021-09-15 ENCOUNTER — Encounter: Payer: Self-pay | Admitting: Physical Therapy

## 2021-09-15 ENCOUNTER — Ambulatory Visit: Payer: Medicare Other | Attending: Surgery | Admitting: Physical Therapy

## 2021-09-15 ENCOUNTER — Other Ambulatory Visit: Payer: Self-pay

## 2021-09-15 ENCOUNTER — Ambulatory Visit
Admission: RE | Admit: 2021-09-15 | Discharge: 2021-09-15 | Disposition: A | Discharge status: Home / Self Care | Payer: Medicare Other | Source: Ambulatory Visit | Attending: Radiation Oncology | Admitting: Radiation Oncology

## 2021-09-15 DIAGNOSIS — R293 Abnormal posture: Secondary | ICD-10-CM | POA: Diagnosis present

## 2021-09-15 DIAGNOSIS — C50412 Malignant neoplasm of upper-outer quadrant of left female breast: Secondary | ICD-10-CM | POA: Insufficient documentation

## 2021-09-15 DIAGNOSIS — Z8042 Family history of malignant neoplasm of prostate: Secondary | ICD-10-CM | POA: Diagnosis not present

## 2021-09-15 DIAGNOSIS — Z803 Family history of malignant neoplasm of breast: Secondary | ICD-10-CM

## 2021-09-15 DIAGNOSIS — C50912 Malignant neoplasm of unspecified site of left female breast: Secondary | ICD-10-CM

## 2021-09-15 DIAGNOSIS — Z170 Estrogen receptor positive status [ER+]: Secondary | ICD-10-CM

## 2021-09-15 DIAGNOSIS — Z79899 Other long term (current) drug therapy: Secondary | ICD-10-CM | POA: Diagnosis not present

## 2021-09-15 DIAGNOSIS — Z7984 Long term (current) use of oral hypoglycemic drugs: Secondary | ICD-10-CM | POA: Diagnosis not present

## 2021-09-15 LAB — CMP (CANCER CENTER ONLY)
ALT: 14 U/L (ref 0–44)
AST: 19 U/L (ref 15–41)
Albumin: 4.4 g/dL (ref 3.5–5.0)
Alkaline Phosphatase: 105 U/L (ref 38–126)
Anion gap: 4 — ABNORMAL LOW (ref 5–15)
BUN: 14 mg/dL (ref 8–23)
CO2: 32 mmol/L (ref 22–32)
Calcium: 10 mg/dL (ref 8.9–10.3)
Chloride: 105 mmol/L (ref 98–111)
Creatinine: 1.04 mg/dL — ABNORMAL HIGH (ref 0.44–1.00)
GFR, Estimated: 59 mL/min — ABNORMAL LOW (ref >60)
Glucose, Bld: 93 mg/dL (ref 70–99)
Potassium: 4.7 mmol/L (ref 3.5–5.1)
Sodium: 141 mmol/L (ref 135–145)
Total Bilirubin: 0.5 mg/dL (ref 0.3–1.2)
Total Protein: 7.2 g/dL (ref 6.5–8.1)

## 2021-09-15 LAB — CBC WITH DIFFERENTIAL (CANCER CENTER ONLY)
Abs Immature Granulocytes: 0.02 10*3/uL (ref 0.00–0.07)
Basophils Absolute: 0 10*3/uL (ref 0.0–0.1)
Basophils Relative: 0 %
Eosinophils Absolute: 0.2 10*3/uL (ref 0.0–0.5)
Eosinophils Relative: 3 %
HCT: 44.7 % (ref 36.0–46.0)
Hemoglobin: 14.3 g/dL (ref 12.0–15.0)
Immature Granulocytes: 0 %
Lymphocytes Relative: 40 %
Lymphs Abs: 2.5 10*3/uL (ref 0.7–4.0)
MCH: 27.3 pg (ref 26.0–34.0)
MCHC: 32 g/dL (ref 30.0–36.0)
MCV: 85.3 fL (ref 80.0–100.0)
Monocytes Absolute: 0.4 10*3/uL (ref 0.1–1.0)
Monocytes Relative: 6 %
Neutro Abs: 3.3 10*3/uL (ref 1.7–7.7)
Neutrophils Relative %: 51 %
Platelet Count: 160 10*3/uL (ref 150–400)
RBC: 5.24 MIL/uL — ABNORMAL HIGH (ref 3.87–5.11)
RDW: 13.7 % (ref 11.5–15.5)
WBC Count: 6.4 10*3/uL (ref 4.0–10.5)
nRBC: 0 % (ref 0.0–0.2)

## 2021-09-15 LAB — GENETIC SCREENING ORDER

## 2021-09-15 NOTE — Progress Notes (Signed)
[facility] Psychosocial Distress Screening Spiritual Care  Met with Karen Vazquez in [facility] to introduce Support Center team/resources, reviewing distress screen per protocol.  The patient scored a 4 on the Psychosocial Distress Thermometer which indicates moderate distress. Also assessed for distress and other psychosocial needs.    09/15/2021  ONCBCN DISTRESS SCREENING   Screening Type Initial Screening   Distress experienced in past week (1-10) 4   Family Problem type Children   Emotional problem type Adjusting to illness   Information Concerns Type Lack of info about diagnosis;Lack of info about treatment   Referral to support programs Yes    Karen Vazquez reports strong faith and good support. She also shared about family stressors.  Provided empathic listening, emotional support, affirmation of strengths, and introduction to support programming.  Follow up needed: Yes.  Karen Vazquez welcomes a follow-up visit in infusion and has direct Spiritual Care number in case needs arise in the meantime.   Chaplain Lisa Lundeen, MDiv, [facility] Pager [phone] Voicemail [phone]       

## 2021-09-15 NOTE — Progress Notes (Signed)
REFERRING PROVIDER: Nicholas Lose, MD [place], [place] [address]  PRIMARY PROVIDER:  Kathyrn Drown, MD  PRIMARY REASON FOR VISIT:  1. Malignant neoplasm of upper-outer quadrant of left breast in female, estrogen receptor positive ([place])   2. Family history of prostate cancer in father   39. Family history of breast cancer     HISTORY OF PRESENT ILLNESS:   [name], a 66 y.o. female, was seen for a [facility] cancer genetics consultation during the breast multidisciplinary clinic at the request of Dr.Gudena due to a personal and family history of cancer.  [name] presents to clinic today to discuss the possibility of a hereditary predisposition to cancer, to discuss genetic testing, and to further clarify her future cancer risks, as well as potential cancer risks for family members.   In June 2023, at the age of 52, Karen Vazquez was diagnosed with invasive ductal carcinoma of the left breast.  CANCER HISTORY:  Oncology History  Malignant neoplasm of upper-outer quadrant of left breast in female, estrogen receptor positive ([place])  09/07/2021 Initial Diagnosis   Screening mammogram detected left breast mass by ultrasound 2 o'clock position measured 1.4 cm axilla was negative.  Ultrasound-guided biopsy revealed grade 2 IDC with focal necrosis ER 90% weak, PR 0%, Ki-67 55%, HER2 3+ positive   09/15/2021 Cancer Staging   Staging form: Breast, AJCC 8th Edition - Clinical: Stage IA (cT1b, cN0, cM0, G3, ER+, PR-, HER2+) - Signed by Nicholas Lose, MD on 09/15/2021 Stage prefix: Initial diagnosis Histologic grading system: 3 grade system      RISK FACTORS:  Menarche was at age 64.  First live birth at age 30.  OCP use for approximately  20  years.  Ovaries intact: yes.  Uterus intact: no.  Menopausal status: postmenopausal.  HRT use: 0 years. Colonoscopy: yes;  1 polyp . Mammogram within the last year: yes. Any excessive radiation exposure in the past:  no  Past Medical History:  Diagnosis Date   Anemia    Anxiety    Arthritis    Back pain    Blood transfusion 2011   at [facility]   Bulging lumbar disc    Chest pain    Depression    Dry mouth    Easy bruising    Excessive thirst    Fatigue    Frequent urination    GERD (gastroesophageal reflux disease)    Headache    Heat intolerance    Hypertension    Hypothyroidism    Joint pain    Leg pain    Muscle stiffness    Nervousness    Palpitations    Pre-diabetes    Restless leg syndrome    Shortness of breath on exertion    Sleep apnea    does not use c-pap machine   Stomach ulcer    Stress    Swelling of both lower extremities    Trouble in sleeping    Vitamin D deficiency    Weakness     Past Surgical History:  Procedure Laterality Date   APPENDECTOMY     BIOPSY  05/16/2019   Procedure: BIOPSY;  Surgeon: Rogene Houston, MD;  [facility];  Service: Endoscopy;;  duodenum antral   BLADDER SUSPENSION  12/29/2010   Procedure: TRANSVAGINAL TAPE (TVT) PROCEDURE;  Surgeon: Daria Pastures;  Location: [facility];  Service: Gynecology;  Laterality: N/A;   BREAST SURGERY     left breast lumpectomy 1977  CATARACT EXTRACTION     COLONOSCOPY N/A 03/03/2016   Procedure: COLONOSCOPY;  Surgeon: Rogene Houston, MD;  [facility];  Service: Endoscopy;  Laterality: N/A;  2:00 - moved to 12/14 @ 12:55 - Ann notified pt   CYSTOCELE REPAIR  12/29/2010   Procedure: ANTERIOR REPAIR (CYSTOCELE);  Surgeon: Daria Pastures;  Location: [facility];  Service: Gynecology;  Laterality: N/A;   CYSTOSCOPY  12/29/2010   Procedure: CYSTOSCOPY;  Surgeon: Daria Pastures;  Location: [facility];  Service: Gynecology;  Laterality: N/A;   ESOPHAGOGASTRODUODENOSCOPY N/A 05/16/2019   Procedure: ESOPHAGOGASTRODUODENOSCOPY (EGD);  Surgeon: Rogene Houston, MD;  [facility];  Service: Endoscopy;  Laterality: N/A;   JOINT REPLACEMENT  2011   rt knee   REPLACEMENT TOTAL KNEE  Right 2011   TONSILLECTOMY     TOTAL KNEE ARTHROPLASTY Left 11/17/2014   Procedure: LEFT TOTAL KNEE ARTHROPLASTY;  Surgeon: Gaynelle Arabian, MD;  Location: [facility];  Service: Orthopedics;  Laterality: Left;   TUBAL LIGATION     VAGINAL HYSTERECTOMY  12/29/2010   Procedure: HYSTERECTOMY VAGINAL;  Surgeon: Daria Pastures;  Location: [facility];  Service: Gynecology;  Laterality: N/A;    Social History   Socioeconomic History   Marital status: Married    Spouse name: Jori Moll   Number of children: Not on file   Years of education: Not on file   Highest education level: Not on file  Occupational History   Occupation: Retired  Tobacco Use   Smoking status: Never   Smokeless tobacco: Never  Vaping Use   Vaping Use: Never used  Substance and Sexual Activity   Alcohol use: No   Drug use: No   Sexual activity: Yes    Birth control/protection: Post-menopausal  Other Topics Concern   Not on file  Social History Narrative   Not on file   Social Determinants of Health   Financial Resource Strain: Low Risk  (08/10/2021)   Overall Financial Resource Strain (CARDIA)    Difficulty of Paying Living Expenses: Not very hard  Food Insecurity: No Food Insecurity (08/10/2021)   Hunger Vital Sign    Worried About Running Out of Food in the Last Year: Never true    Ran Out of Food in the Last Year: Never true  Transportation Needs: No Transportation Needs (08/10/2021)   PRAPARE - Hydrologist (Medical): No    Lack of Transportation (Non-Medical): No  Physical Activity: Insufficiently Active (08/10/2021)   Exercise Vital Sign    Days of Exercise per Week: 3 days    Minutes of Exercise per Session: 30 min  Stress: Stress Concern Present (08/10/2021)   [place]    Feeling of Stress : To some extent  Social Connections: Socially Integrated (08/10/2021)   Social Connection and Isolation Panel [NHANES]     Frequency of Communication with Friends and Family: More than three times a week    Frequency of Social Gatherings with Friends and Family: More than three times a week    Attends Religious Services: More than 4 times per year    Active Member of [employer] or Organizations: Yes    Attends Music therapist: More than 4 times per year    Marital Status: Married     FAMILY HISTORY:  We obtained a detailed, 4-generation family history.  Significant diagnoses are listed below: Family History  Problem Relation Age of Onset  Stroke Mother    Hypertension Mother    Hyperlipidemia Mother    Thyroid disease Mother    Heart disease Father    COPD Father    Depression Father    Prostate cancer Father 56 - 34   Breast cancer Paternal Grandmother 39 - 61   Diabetes Paternal Grandfather    Colon cancer Cousin      Ms. Perkin's maternal cousin was diagnosed with colon cancer. Her father was diagnosed with prostate cancer in his 65s, he died at age 91. Her paternal grandmother was diagnosed with breast cancer in her 46s, she is deceased. Ms. Bechler is unaware of previous family history of genetic testing for hereditary cancer risks. There is no reported Ashkenazi Jewish ancestry.  GENETIC COUNSELING ASSESSMENT: Ms. Granderson is a 66 y.o. female with a personal and family history of cancer which is somewhat suggestive of a hereditary cancer syndrome and predisposition to cancer. We, therefore, discussed and recommended the following at today's visit.   DISCUSSION: We discussed that 5 - 10% of cancer is hereditary, with most cases of hereditary breast cancer associated with mutations in BRCA1/2.  There are other genes that can be associated with hereditary breast cancer syndromes. Type of cancer risk and level of risk are gene-specific. We discussed that testing is beneficial for several reasons including knowing how to follow individuals after completing their treatment, identifying whether  potential treatment options would be beneficial, and understanding if other family members could be at risk for cancer and allowing them to undergo genetic testing.   We reviewed the characteristics, features and inheritance patterns of hereditary cancer syndromes. We also discussed genetic testing, including the appropriate family members to test, the process of testing, insurance coverage and turn-around-time for results. We discussed the implications of a negative, positive and/or variant of uncertain significant result. In order to get genetic test results in a timely manner so that Ms. Mcmillen can use these genetic test results for surgical decisions, we recommended Ms. Tamburri pursue genetic testing for the BRCAplus. Once complete, we recommend Ms. Petrich pursue reflex genetic testing to a more comprehensive gene panel.   Ms. Longwell elected to have [facility] CustomNext Panel. The CustomNext-Cancer+RNAinsight panel offered by [facility] includes sequencing and rearrangement analysis for the following 47 genes:  APC, ATM, AXIN2, BARD1, BMPR1A, BRCA1, BRCA2, BRIP1, CDH1, CDK4, CDKN2A, CHEK2, CTNNA1, DICER1, EPCAM, GREM1, HOXB13, KIT, MEN1, MLH1, MSH2, MSH3, MSH6, MUTYH, NBN, NF1, NTHL1, PALB2, PDGFRA, PMS2, POLD1, POLE, PTEN, RAD50, RAD51C, RAD51D, SDHA, SDHB, SDHC, SDHD, SMAD4, SMARCA4, STK11, TP53, TSC1, TSC2, and VHL.  RNA data is routinely analyzed for use in variant interpretation for all genes.  Based on Ms. Pelfrey's personal and family history of cancer, she meets medical criteria for genetic testing. Despite that she meets criteria, she may still have an out of pocket cost. We discussed that if her out of pocket cost for testing is over $100, the laboratory should contact them to discuss self-pay prices, patient pay assistance programs, if applicable, and other billing options.   PLAN: After considering the risks, benefits, and limitations, Ms. Warth provided informed consent to pursue  genetic testing and the blood sample was sent to [facility] for analysis of the CustomNext Panel. Results should be available within approximately 1-2 weeks' time, at which point they will be disclosed by telephone to Ms. Cirelli, as will any additional recommendations warranted by these results. Ms. Depass will receive a summary of her genetic counseling visit and a copy  of her results once available. This information will also be available in Epic.   Ms. Garrette questions were answered to her satisfaction today. Our contact information was provided should additional questions or concerns arise. Thank you for the referral and allowing [place] to share in the care of your patient.   Lucille Passy, MS, [facility] Genetic Counselor [email or web address] (P) [phone]  The patient was seen for a total of 20 minutes in face-to-face genetic counseling. The patient brought her husband. Drs. Lindi Adie and/or Burr Medico were available to discuss this case as needed.  _______________________________________________________________________ For Office Staff:  Number of people involved in session: 2 Was an Intern/ student involved with case: no

## 2021-09-15 NOTE — Assessment & Plan Note (Signed)
09/07/2021:Screening mammogram detected left breast mass by ultrasound 2 o'clock position measured 1.4 cm axilla was negative.  Ultrasound-guided biopsy revealed grade 2 IDC with focal necrosis ER 90% weak, PR 0%, Ki-67 55%, HER2 3+ positive  Pathology and radiology counseling: Discussed with the patient, the details of pathology including the type of breast cancer,the clinical staging, the significance of ER, PR and HER-2/neu receptors and the implications for treatment. After reviewing the pathology in detail, we proceeded to discuss the different treatment options between surgery, radiation, chemotherapy, antiestrogen therapies.  Treatment plan: 1.  Breast conserving surgery with sentinel lymph node biopsy 2. adjuvant chemotherapy with Taxol Herceptin weekly x12 followed by Herceptin maintenance every 3 weeks for 1 year 3.  Adjuvant radiation therapy 4.  Adjuvant antiestrogen therapy Genetics consultation  Chemo counseling: Discussed the pros and cons of adjuvant chemotherapy with Taxol and Herceptin.  We discussed the adverse effects of Taxol including peripheral neuropathy cytopenias and allergic reactions LFT changes as well as risk of bone marrow dysfunction.  We also discussed the Herceptin related adverse effects including decreased cardiac ejection fraction.  Return to clinic after surgery to discuss final pathology report.

## 2021-09-15 NOTE — Research (Signed)
Trial:  [employer] 2021-05 - Specimen Collection Study to Evaluate Biomarkers in Subjects with Cancer   Patient Karen Vazquez was identified by Dr. Lindi Adie as a potential candidate for the above listed study.  This Clinical Research Nurse met with Karen Vazquez, [id number], on 09/15/21 in a manner and location that ensures patient privacy to discuss participation in the above listed research study.  Patient is Accompanied by her husband .  A copy of the informed consent document with embedded HIPAA language was provided to the patient.  Patient reads, speaks, and understands [place].   Patient was provided with the business card of this Nurse and encouraged to contact the research team with any questions.  Approximately 5 minutes were spent with the patient reviewing the informed consent documents.  Patient was provided the option of taking informed consent documents home to review and was encouraged to review at their convenience with their support network, including other care providers. Patient took the consent documents home to review. Foye Spurling, BSN, RN, [place] Nurse II 09/15/2021

## 2021-09-15 NOTE — Therapy (Signed)
OUTPATIENT PHYSICAL THERAPY BREAST CANCER BASELINE EVALUATION   Patient Name: Karen Vazquez MRN: [id number] DOB:09/18/1955, 66 y.o., female Today's Date: 09/15/2021   PT End of Session - 09/15/21 1455     Visit Number 1    Number of Visits 2    Date for PT Re-Evaluation 11/10/21    PT Start Time 1400    PT Stop Time 1416   Also saw pt from 1433-1445 for a total of 28 min   PT Time Calculation (min) 16 min    Activity Tolerance Patient tolerated treatment well    Behavior During Therapy [facility] for tasks assessed/performed             Past Medical History:  Diagnosis Date   Anemia    Anxiety    Arthritis    Back pain    Blood transfusion 2011   at [facility]   Bulging lumbar disc    Chest pain    Depression    Dry mouth    Easy bruising    Excessive thirst    Fatigue    Frequent urination    GERD (gastroesophageal reflux disease)    Headache    Heat intolerance    Hypertension    Hypothyroidism    Joint pain    Leg pain    Muscle stiffness    Nervousness    Palpitations    Pre-diabetes    Restless leg syndrome    Shortness of breath on exertion    Sleep apnea    does not use c-pap machine   Stomach ulcer    Stress    Swelling of both lower extremities    Trouble in sleeping    Vitamin D deficiency    Weakness    Past Surgical History:  Procedure Laterality Date   APPENDECTOMY     BIOPSY  05/16/2019   Procedure: BIOPSY;  Surgeon: Rogene Houston, MD;  [facility];  Service: Endoscopy;;  duodenum antral   BLADDER SUSPENSION  12/29/2010   Procedure: TRANSVAGINAL TAPE (TVT) PROCEDURE;  Surgeon: Daria Pastures;  Location: [facility];  Service: Gynecology;  Laterality: N/A;   BREAST SURGERY     left breast lumpectomy 1977   CATARACT EXTRACTION     COLONOSCOPY N/A 03/03/2016   Procedure: COLONOSCOPY;  Surgeon: Rogene Houston, MD;  [facility];  Service: Endoscopy;  Laterality: N/A;  2:00 - moved to 12/14 @ 12:55 - Ann notified pt    CYSTOCELE REPAIR  12/29/2010   Procedure: ANTERIOR REPAIR (CYSTOCELE);  Surgeon: Daria Pastures;  Location: [facility];  Service: Gynecology;  Laterality: N/A;   CYSTOSCOPY  12/29/2010   Procedure: CYSTOSCOPY;  Surgeon: Daria Pastures;  Location: [facility];  Service: Gynecology;  Laterality: N/A;   ESOPHAGOGASTRODUODENOSCOPY N/A 05/16/2019   Procedure: ESOPHAGOGASTRODUODENOSCOPY (EGD);  Surgeon: Rogene Houston, MD;  [facility];  Service: Endoscopy;  Laterality: N/A;   JOINT REPLACEMENT  2011   rt knee   REPLACEMENT TOTAL KNEE Right 2011   TONSILLECTOMY     TOTAL KNEE ARTHROPLASTY Left 11/17/2014   Procedure: LEFT TOTAL KNEE ARTHROPLASTY;  Surgeon: Gaynelle Arabian, MD;  Location: [facility];  Service: Orthopedics;  Laterality: Left;   TUBAL LIGATION     VAGINAL HYSTERECTOMY  12/29/2010   Procedure: HYSTERECTOMY VAGINAL;  Surgeon: Daria Pastures;  Location: [facility];  Service: Gynecology;  Laterality: N/A;   Patient Active Problem List   Diagnosis Date Noted  Malignant neoplasm of upper-outer quadrant of left breast in female, estrogen receptor positive ([place]) 09/09/2021   Menopausal symptom 08/10/2021   UTI (urinary tract infection) 08/10/2021   Eating disorder 04/01/2021   Lesion of vulva 12/22/2020   History of iron deficiency anemia 10/27/2020   Cataract, nuclear sclerotic, both eyes 01/10/2020   Duodenal ulcer 06/24/2019   Fracture of distal end of radius 01/22/2018   History of total knee replacement, right 06/08/2017   History of colonic polyps 04/26/2016   OA (osteoarthritis) of knee 11/17/2014   Essential hypertension 05/28/2014   Obstructive sleep apnea 05/28/2014   Anemia, iron deficiency 12/16/2013   Prediabetes 05/01/2013   GERD (gastroesophageal reflux disease) 02/06/2013   Hypothyroidism 12/12/2012   Insomnia 12/12/2012   Hyperlipidemia 12/12/2012   Osteoarthritis 12/12/2012   Restless legs syndrome 12/12/2012   S/P LASIK surgery of both eyes 03/21/1998     REFERRING PROVIDER: Dr. Erroll Luna  REFERRING DIAG: Left breast cancer  THERAPY DIAG:  Malignant neoplasm of upper-outer quadrant of left breast in female, estrogen receptor positive ([place])  Abnormal posture  Rationale for Evaluation and Treatment Rehabilitation  ONSET DATE: 08/17/2021  SUBJECTIVE                                                                                                                                                                                           SUBJECTIVE STATEMENT: Patient reports she is here today to be seen by her medical team for her newly diagnosed left breast cancer.   PERTINENT HISTORY:  Patient was diagnosed on 08/17/2021 with left grade 2 invasive ductal carcinoma breast cancer. It measures 1.4 cm and is located in the upper outer quadrant. It is ER positive, PR negative, and HER2 positive with a Ki67 of 55%. She had a benign left breast lump removed in 1977, has anemia, hypertension, and has had both knee replaced in 2011 and 2016 (unknown when right versus left were done).  PATIENT GOALS   reduce lymphedema risk and learn post op HEP.   PAIN:  Are you having pain? No   PRECAUTIONS: Active CA   HAND DOMINANCE: right  WEIGHT BEARING RESTRICTIONS No  FALLS:  Has patient fallen in last 6 months? No  LIVING ENVIRONMENT: Patient lives with: her husband Lives in: House/apartment Has following equipment at home: None  OCCUPATION: Retired from the [employer] services  LEISURE: She is active but reports she does not exercise  PRIOR LEVEL OF FUNCTION: Independent   OBJECTIVE  COGNITION:  Overall cognitive status: Within functional limits for tasks assessed    POSTURE:  Forward head and rounded shoulders posture  UPPER  EXTREMITY AROM/PROM:  A/PROM RIGHT   eval   Shoulder extension 53  Shoulder flexion 156  Shoulder abduction 167  Shoulder internal rotation 76  Shoulder external rotation 74    (Blank rows =  not tested)  A/PROM LEFT   eval  Shoulder extension 46  Shoulder flexion 146  Shoulder abduction 151  Shoulder internal rotation 63  Shoulder external rotation 68    (Blank rows = not tested)   CERVICAL AROM: All within normal limits  UPPER EXTREMITY STRENGTH: WFL   LYMPHEDEMA ASSESSMENTS:   LANDMARK RIGHT   eval  10 cm proximal to olecranon process 36.8  Olecranon process 28.8  10 cm proximal to ulnar styloid process 24.2  Just proximal to ulnar styloid process 16.1  Across hand at thumb web space 19.4  At base of 2nd digit 6.1  (Blank rows = not tested)  LANDMARK LEFT   eval  10 cm proximal to olecranon process 37.4  Olecranon process 29.5  10 cm proximal to ulnar styloid process 25.2  Just proximal to ulnar styloid process 16.9  Across hand at thumb web space 19  At base of 2nd digit 5.9  (Blank rows = not tested)   L-DEX LYMPHEDEMA SCREENING:  The patient was assessed using the L-Dex machine today to produce a lymphedema index baseline score. The patient will be reassessed on a regular basis (typically every 3 months) to obtain new L-Dex scores. If the score is > 6.5 points away from his/her baseline score indicating onset of subclinical lymphedema, it will be recommended to wear a compression garment for 4 weeks, 12 hours per day and then be reassessed. If the score continues to be > 6.5 points from baseline at reassessment, we will initiate lymphedema treatment. Assessing in this manner has a 95% rate of preventing clinically significant lymphedema.   L-DEX FLOWSHEETS - 09/15/21 1400       L-DEX LYMPHEDEMA SCREENING   Measurement Type Unilateral    L-DEX MEASUREMENT EXTREMITY Upper Extremity    POSITION  Standing    DOMINANT SIDE Right    At Risk Side Left    BASELINE SCORE (UNILATERAL) 2.2              QUICK DASH SURVEY:  Katina Dung - 09/15/21 0001     Open a tight or new jar No difficulty    Do heavy household chores (wash walls, wash floors)  No difficulty    Carry a shopping bag or briefcase No difficulty    Wash your back No difficulty    Use a knife to cut food No difficulty    Recreational activities in which you take some force or impact through your arm, shoulder, or hand (golf, hammering, tennis) No difficulty    During the past week, to what extent has your arm, shoulder or hand problem interfered with your normal social activities with family, friends, neighbors, or groups? Slightly    During the past week, to what extent has your arm, shoulder or hand problem limited your work or other regular daily activities Slightly    Arm, shoulder, or hand pain. None    Tingling (pins and needles) in your arm, shoulder, or hand None    Difficulty Sleeping Mild difficulty    DASH Score 6.82 %              PATIENT EDUCATION:  Education details: Lymphedema risk reduction and post op shoulder/posture HEP Person educated: Patient Education method: Explanation, Demonstration, Handout Education comprehension:  Patient verbalized understanding and returned demonstration   HOME EXERCISE PROGRAM: Patient was instructed today in a home exercise program today for post op shoulder range of motion. These included active assist shoulder flexion in sitting, scapular retraction, wall walking with shoulder abduction, and hands behind head external rotation.  She was encouraged to do these twice a day, holding 3 seconds and repeating 5 times when permitted by her physician.   ASSESSMENT:  CLINICAL IMPRESSION: Patient was diagnosed on 08/17/2021 with left grade 2 invasive ductal carcinoma breast cancer. It measures 1.4 cm and is located in the upper outer quadrant. It is ER positive, PR negative, and HER2 positive with a Ki67 of 55%. She had a benign left breast lump removed in 1977, has anemia, hypertension, and has had both knee replaced in 2011 and 2016 (unknown when right versus left were done).Her multidisciplinary medical team met prior to  her assessments to determine a recommended treatment plan. She is planning to have a left lumpectomy and sentinel node biopsy followed by chemotherapy, radiation, and anti-estrogen therapy. She will benefit from a post op PT reassessment to determine needs and from L-Dex screens every 3 months for 2 years to detect subclinical lymphedema.  Pt will benefit from skilled therapeutic intervention to improve on the following deficits: Decreased knowledge of precautions, impaired UE functional use, pain, decreased ROM, postural dysfunction.   PT treatment/interventions: ADL/self-care home management, pt/family education, therapeutic exercise  REHAB POTENTIAL: Excellent  CLINICAL DECISION MAKING: Stable/uncomplicated  EVALUATION COMPLEXITY: Low   GOALS: Goals reviewed with patient? YES  LONG TERM GOALS: (STG=LTG)    Name Target Date Goal status  1 Pt will be able to verbalize understanding of pertinent lymphedema risk reduction practices relevant to her dx specifically related to skin care.  Baseline:  No knowledge 09/15/2021 Achieved at eval  2 Pt will be able to return demo and/or verbalize understanding of the post op HEP related to regaining shoulder ROM. Baseline:  No knowledge 09/15/2021 Achieved at eval  3 Pt will be able to verbalize understanding of the importance of attending the post op After Breast CA Class for further lymphedema risk reduction education and therapeutic exercise.  Baseline:  No knowledge 09/15/2021 Achieved at eval  4 Pt will demo she has regained full shoulder ROM and function post operatively compared to baselines.  Baseline: See objective measurements taken today. 11/10/2021      PLAN: PT FREQUENCY/DURATION: EVAL and 1 follow up appointment.   PLAN FOR NEXT SESSION: will reassess 3-4 weeks post op to determine needs.   Patient will follow up at outpatient cancer rehab 3-4 weeks following surgery.  If the patient requires physical therapy at that time, a  specific plan will be dictated and sent to the referring physician for approval. The patient was educated today on appropriate basic range of motion exercises to begin post operatively and the importance of attending the After Breast Cancer class following surgery.  Patient was educated today on lymphedema risk reduction practices as it pertains to recommendations that will benefit the patient immediately following surgery.  She verbalized good understanding.    Physical Therapy Information for After Breast Cancer Surgery/Treatment:  Lymphedema is a swelling condition that you may be at risk for in your arm if you have lymph nodes removed from the armpit area.  After a sentinel node biopsy, the risk is approximately 5-9% and is higher after an axillary node dissection.  There is treatment available for this condition and it is not life-threatening.  Contact your physician or physical therapist with concerns. You may begin the 4 shoulder/posture exercises (see additional sheet) when permitted by your physician (typically a week after surgery).  If you have drains, you may need to wait until those are removed before beginning range of motion exercises.  A general recommendation is to not lift your arms above shoulder height until drains are removed.  These exercises should be done to your tolerance and gently.  This is not a "no pain/no gain" type of recovery so listen to your body and stretch into the range of motion that you can tolerate, stopping if you have pain.  If you are having immediate reconstruction, ask your plastic surgeon about doing exercises as he or she may want you to wait. We encourage you to attend the free one time ABC (After Breast Cancer) class offered by [facility].  You will learn information related to lymphedema risk, prevention and treatment and additional exercises to regain mobility following surgery.  You can call [phone] for more information.  This  is offered the 1st and 3rd Monday of each month.  You only attend the class one time. While undergoing any medical procedure or treatment, try to avoid blood pressure being taken or needle sticks from occurring on the arm on the side of cancer.   This recommendation begins after surgery and continues for the rest of your life.  This may help reduce your risk of getting lymphedema (swelling in your arm). An excellent resource for those seeking information on lymphedema is the National Lymphedema Network's web site. It can be accessed at [place] If you notice swelling in your hand, arm or breast at any time following surgery (even if it is many years from now), please contact your doctor or physical therapist to discuss this.  Lymphedema can be treated at any time but it is easier for you if it is treated early on.  If you feel like your shoulder motion is not returning to normal in a reasonable amount of time, please contact your surgeon or physical therapist.  [place] [phone]. [address], [address], [place] [address]  ABC CLASS After Breast Cancer Class  After Breast Cancer Class is a specially designed exercise class to assist you in a safe recover after having breast cancer surgery.  In this class you will learn how to get back to full function whether your drains were just removed or if you had surgery a month ago.  This one-time class is held the 1st and 3rd Monday of every month from 11:00 a.m. until 12:00 noon virtually.  This class is FREE and space is limited. For more information or to register for the next available class, call [phone].  Class Goals  Understand specific stretches to improve the flexibility of you chest and shoulder. Learn ways to safely strengthen your upper body and improve your posture. Understand the warning signs of infection and why you may be at risk for an arm infection. Learn about Lymphedema and  prevention.  ** You do not attend this class until after surgery.  Drains must be removed to participate  Patient was instructed today in a home exercise program today for post op shoulder range of motion. These included active assist shoulder flexion in sitting, scapular retraction, wall walking with shoulder abduction, and hands behind head external rotation.  She was encouraged to do these twice a day, holding 3 seconds and repeating 5  times when permitted by her physician.  Annia Friendly, [place] 09/15/21 3:01 PM

## 2021-09-17 ENCOUNTER — Other Ambulatory Visit: Payer: Self-pay | Admitting: Surgery

## 2021-09-17 DIAGNOSIS — C50912 Malignant neoplasm of unspecified site of left female breast: Secondary | ICD-10-CM

## 2021-09-20 ENCOUNTER — Telehealth: Payer: Self-pay | Admitting: Hematology and Oncology

## 2021-09-20 ENCOUNTER — Encounter: Payer: Self-pay | Admitting: Genetic Counselor

## 2021-09-20 ENCOUNTER — Telehealth: Payer: Self-pay | Admitting: Radiology

## 2021-09-20 ENCOUNTER — Telehealth: Payer: Self-pay | Admitting: Genetic Counselor

## 2021-09-20 DIAGNOSIS — Z1379 Encounter for other screening for genetic and chromosomal anomalies: Secondary | ICD-10-CM | POA: Insufficient documentation

## 2021-09-20 NOTE — Telephone Encounter (Signed)
.  Called patient to schedule appointment per 7/3 inbasket, patient is aware of date and time.   

## 2021-09-20 NOTE — Telephone Encounter (Signed)
[employer] 2021-05 - Specimen Collection Study to Evaluate Biomarkers in Subjects with Cancer    09/20/21  PHONE CALL: Confirmed I was speaking with Karen Vazquez. Informed patient reason for call is to follow-up on the above mentioned study. Patient is interested in participation. Plan for consent visit and labs prior to echo on 09/28/2021. Patient was thanked for her time and looking forward to meeting her next week.   Carol Ada, RT(R)(T) Clinical Research Coordinator

## 2021-09-20 NOTE — Telephone Encounter (Signed)
I contacted Ms. Moe to discuss her genetic testing results. No pathogenic variants were identified in the first 8 genes analyzed. Of note, we are still waiting on the pan-cancer panel. Detailed clinic note to follow.  The test report has been scanned into EPIC and is located under the Molecular Pathology section of the Results Review tab.  A portion of the result report is included below for reference.   Lucille Passy, MS, [facility] Genetic Counselor [email or web address] (P) [phone]

## 2021-09-22 ENCOUNTER — Encounter: Payer: Self-pay | Admitting: *Deleted

## 2021-09-22 ENCOUNTER — Telehealth: Payer: Self-pay | Admitting: *Deleted

## 2021-09-22 NOTE — Telephone Encounter (Signed)
Spoke with patient to follow up from [facility] and assess navigation needs. Patient denies any questions or concerns at this time. Encouraged her to to call should anything arise. Patient verbalized understanding.

## 2021-09-23 ENCOUNTER — Telehealth: Payer: Self-pay | Admitting: Hematology and Oncology

## 2021-09-23 NOTE — Telephone Encounter (Signed)
.  Called patient to schedule appointment per 7/5 inbasket, patient is aware of date and time.   

## 2021-09-28 ENCOUNTER — Inpatient Hospital Stay: Payer: Medicare Other

## 2021-09-28 ENCOUNTER — Ambulatory Visit (HOSPITAL_COMMUNITY)
Admission: RE | Admit: 2021-09-28 | Discharge: 2021-09-28 | Disposition: A | Discharge status: Home / Self Care | Payer: Medicare Other | Source: Ambulatory Visit | Attending: Hematology and Oncology | Admitting: Hematology and Oncology

## 2021-09-28 ENCOUNTER — Inpatient Hospital Stay: Payer: Medicare Other | Attending: Hematology and Oncology | Admitting: Radiology

## 2021-09-28 ENCOUNTER — Other Ambulatory Visit: Payer: Self-pay

## 2021-09-28 DIAGNOSIS — Z170 Estrogen receptor positive status [ER+]: Secondary | ICD-10-CM | POA: Diagnosis not present

## 2021-09-28 DIAGNOSIS — Z0189 Encounter for other specified special examinations: Secondary | ICD-10-CM

## 2021-09-28 DIAGNOSIS — I10 Essential (primary) hypertension: Secondary | ICD-10-CM | POA: Diagnosis not present

## 2021-09-28 DIAGNOSIS — G4730 Sleep apnea, unspecified: Secondary | ICD-10-CM | POA: Diagnosis not present

## 2021-09-28 DIAGNOSIS — C50412 Malignant neoplasm of upper-outer quadrant of left female breast: Secondary | ICD-10-CM | POA: Diagnosis not present

## 2021-09-28 LAB — ECHOCARDIOGRAM COMPLETE
AR max vel: 1.83 cm2
AV Peak grad: 12.3 mmHg
Ao pk vel: 1.75 m/s
Area-P 1/2: 3.1 cm2
Calc EF: 63.1 %
P 1/2 time: 497 msec
S' Lateral: 2.9 cm
Single Plane A2C EF: 61.8 %
Single Plane A4C EF: 64.2 %

## 2021-09-28 LAB — RESEARCH LABS

## 2021-09-28 NOTE — Research (Signed)
[employer] 2021-05 - Specimen Collection Study to Evaluate Biomarkers in Subjects with Cancer    09/28/2021  ELIGIBILITY:  This Coordinator has reviewed this patient's inclusion and exclusion criteria and confirmed Karen Vazquez is eligible for study participation.  Patient will continue with enrollment.  Eligibility confirmed by research nurse and treating investigator, who also agrees that patient should proceed with enrollment.   CONSENT:  Patient Karen Vazquez was identified by Dr. Lindi Adie as a potential candidate for the above listed study.  This Clinical Research Coordinator met with Karen Vazquez, [id number] on 09/28/21 in a manner and location that ensures patient privacy to discuss participation in the above listed research study.  Patient is Unaccompanied.  Patient was previously provided with informed consent documents.  Patient confirmed they have read the informed consent documents.  As outlined in the informed consent form, this Coordinator and [name] discussed the purpose of the research study, the investigational nature of the study, study procedures and requirements for study participation, potential risks and benefits of study participation, as well as alternatives to participation.  This study is not blinded or double-blinded. The patient understands participation is voluntary and they may withdraw from study participation at any time.  This study does not involve randomization.  This study does not involve an investigational drug or device. This study does not involve a placebo. Patient understands enrollment is pending full eligibility review.   Confidentiality and how the patient's information will be used as part of study participation were discussed.  Patient was informed there is reimbursement provided for their time and effort spent on trial participation.  The patient is encouraged to discuss research study participation with their insurance provider to  determine what costs they may incur as part of study participation, including research related injury.    All questions were answered to patient's satisfaction.  The informed consent with embedded HIPAA language was reviewed page by page.  The patient's mental and emotional status is appropriate to provide informed consent, and the patient verbalizes an understanding of study participation.  Patient has agreed to participate in the above listed research study and has voluntarily signed the informed consent version dated 19 Apr 2021 with embedded HIPAA language, version dated 03 Apr 2020  on 09/28/21 at 8:17AM.  The patient was provided with a copy of the signed informed consent form with embedded HIPAA language for their reference.  No study specific procedures were obtained prior to the signing of the informed consent document.  Approximately 20 minutes were spent with the patient reviewing the informed consent documents.  Patient was not requested to complete a Release of Information form.   After completion of consent documentation, medical history was obtained per patient as follows:   Medical History:  High Blood Pressure  Yes Coronary Artery Disease No Lupus    No Rheumatoid Arthritis  No Diabetes   No      Lynch Syndrome  No  Is the patient currently taking a magnesium supplement?   No   Does the patient have a personal history of cancer (greater than 5 years ago)?  No   Does the patient have a family history of cancer in 1st or 2nd degree relatives? Yes If yes, Relationship(s) and Cancer type(s)?  Paternal grandmother: breast,  Father: prostate  Does the patient have history of alcohol consumption? No     Does the patient have history of cigarette, cigar, pipe, or chewing tobacco use?  No  After completion of consent documents and medical history, blood collection was complete and a VISA gift card was given to patient. Patient was thanked for her time.   Blood Collection:  Research blood obtained by [facility] venipuncture per patient's preference. Patient tolerated well without any adverse events. Gift Card: $50 gift card given to patient for her participation in this study.    Carol Ada, RT(R)(T) Clinical Research Coordinator

## 2021-09-28 NOTE — Research (Signed)
[employer] 2021-05 - Specimen Collection Study to Evaluate Biomarkers in Subjects with Cancer    This Nurse has reviewed this patient's inclusion and exclusion criteria as a second review and confirms Karen Vazquez is eligible for study participation.  Patient may continue with enrollment.  Marjie Skiff Tatem Fesler, RN, BSN, [facility] She  Her  Hers Clinical Research Nurse [facility] Direct Dial [phone]  Pager [phone] 09/28/2021 9:23 AM

## 2021-10-06 ENCOUNTER — Other Ambulatory Visit: Payer: Self-pay

## 2021-10-06 ENCOUNTER — Encounter (HOSPITAL_BASED_OUTPATIENT_CLINIC_OR_DEPARTMENT_OTHER): Payer: Self-pay | Admitting: Surgery

## 2021-10-08 ENCOUNTER — Telehealth: Payer: Self-pay | Admitting: Genetic Counselor

## 2021-10-08 NOTE — Telephone Encounter (Signed)
I contacted Ms. Martelle to discuss her genetic testing results. No pathogenic variants were identified in the 47 genes analyzed. Detailed clinic note to follow.  The test report has been scanned into EPIC and is located under the Molecular Pathology section of the Results Review tab.  A portion of the result report is included below for reference.   Lucille Passy, MS, [facility] Genetic Counselor [email or web address] (P) [phone]

## 2021-10-11 ENCOUNTER — Ambulatory Visit: Payer: Self-pay | Admitting: Genetic Counselor

## 2021-10-11 ENCOUNTER — Ambulatory Visit (INDEPENDENT_AMBULATORY_CARE_PROVIDER_SITE_OTHER): Payer: Medicare Other | Admitting: Bariatrics

## 2021-10-11 ENCOUNTER — Encounter (INDEPENDENT_AMBULATORY_CARE_PROVIDER_SITE_OTHER): Payer: Self-pay | Admitting: Bariatrics

## 2021-10-11 VITALS — BP 141/76 | HR 70 | Temp 97.7°F | Ht 64.0 in | Wt 224.0 lb

## 2021-10-11 DIAGNOSIS — I10 Essential (primary) hypertension: Secondary | ICD-10-CM

## 2021-10-11 DIAGNOSIS — E669 Obesity, unspecified: Secondary | ICD-10-CM | POA: Diagnosis not present

## 2021-10-11 DIAGNOSIS — Z1379 Encounter for other screening for genetic and chromosomal anomalies: Secondary | ICD-10-CM

## 2021-10-11 DIAGNOSIS — E7849 Other hyperlipidemia: Secondary | ICD-10-CM

## 2021-10-11 DIAGNOSIS — Z6838 Body mass index (BMI) 38.0-38.9, adult: Secondary | ICD-10-CM | POA: Diagnosis not present

## 2021-10-11 NOTE — Progress Notes (Signed)
HPI:   Karen Vazquez was previously seen in the [place] clinic due to a personal and family history of cancer and concerns regarding a hereditary predisposition to cancer. Please refer to our prior cancer genetics clinic note for more information regarding our discussion, assessment and recommendations, at the time. Karen Vazquez recent genetic test results were disclosed to her, as were recommendations warranted by these results. These results and recommendations are discussed in more detail below.  CANCER HISTORY:  Oncology History  Malignant neoplasm of upper-outer quadrant of left breast in female, estrogen receptor positive ([place])  09/07/2021 Initial Diagnosis   Screening mammogram detected left breast mass by ultrasound 2 o'clock position measured 1.4 cm axilla was negative.  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 Report date is 10/04/2021.  The CustomNext-Cancer+RNAinsight panel offered by [facility] includes sequencing and rearrangement analysis for the following 47 genes:  APC, ATM, AXIN2, BARD1, BMPR1A, BRCA1, BRCA2, BRIP1, CDH1, CDK4, CDKN2A, CHEK2, CTNNA1, DICER1, EPCAM, GREM1, HOXB13, KIT, MEN1, MLH1, MSH2, MSH3, MSH6, MUTYH, NBN, NF1, NTHL1, PALB2, PDGFRA, PMS2, POLD1, POLE, PTEN, RAD50, RAD51C, RAD51D, SDHA, SDHB, SDHC, SDHD, SMAD4, SMARCA4, STK11, TP53, TSC1, TSC2, and VHL.  RNA data is routinely analyzed for use in variant interpretation for all genes.     FAMILY HISTORY:  We obtained a detailed, 4-generation family history.  Significant diagnoses are listed below:      Family History   Problem Relation Age of Onset   Stroke Mother     Hypertension Mother     Hyperlipidemia Mother     Thyroid disease Mother     Heart disease Father     COPD Father     Depression Father     Prostate cancer Father 93 - 65   Breast cancer Paternal Grandmother 34 - 56   Diabetes Paternal Grandfather     Colon cancer Cousin         Karen Vazquez's maternal cousin was diagnosed with colon cancer. Her father was diagnosed with prostate cancer in his 17s, he died at age 42. Her paternal grandmother was diagnosed with breast cancer in her 64s, she is deceased. Karen Vazquez is unaware of previous family history of genetic testing for hereditary cancer risks. There is no reported Ashkenazi Jewish ancestry.  GENETIC TEST RESULTS:  The [facility] CustomNext Panel found no pathogenic mutations.  The CustomNext-Cancer+RNAinsight panel offered by [facility] includes sequencing and rearrangement analysis for the following 47 genes:  APC, ATM, AXIN2, BARD1, BMPR1A, BRCA1, BRCA2, BRIP1, CDH1, CDK4, CDKN2A, CHEK2, CTNNA1, DICER1, EPCAM, GREM1, HOXB13, KIT, MEN1, MLH1, MSH2, MSH3, MSH6, MUTYH, NBN, NF1, NTHL1, PALB2, PDGFRA, PMS2, POLD1, POLE, PTEN, RAD50, RAD51C, RAD51D, SDHA, SDHB, SDHC, SDHD, SMAD4, SMARCA4, STK11, TP53, TSC1, TSC2, and VHL.  RNA data is routinely analyzed for use in variant interpretation for all genes.  The test report has been scanned into EPIC and is located under the Molecular Pathology section of the Results Review tab.  A portion of the result report is included below for reference. Genetic testing reported out on 10/04/2021.       Even though  a pathogenic variant was not identified, possible explanations for the cancer in the family may include: There may be no hereditary risk for cancer in the family. The cancers in Karen Vazquez and/or her family may be due to other genetic or environmental factors. There may be a gene mutation in one of these genes that current testing methods  cannot detect, but that chance is small. There could be another gene that has not yet been discovered, or that we have not yet tested, that is responsible for the cancer diagnoses in the family.  It is also possible there is a hereditary cause for the cancer in the family that Karen Vazquez did not inherit.  Therefore, it is important to remain in touch with cancer genetics in the future so that we can continue to offer Karen Vazquez the most up to date genetic testing.   ADDITIONAL GENETIC TESTING:  We discussed with Karen Vazquez that her genetic testing was fairly extensive.  If there are genes identified to increase cancer risk that can be analyzed in the future, we would be happy to discuss and coordinate this testing at that time.    CANCER SCREENING RECOMMENDATIONS:  Karen Vazquez test result is considered negative (normal).  This means that we have not identified a hereditary cause for her personal and family history of cancer at this time.   An individual's cancer risk and medical management are not determined by genetic test results alone. Overall cancer risk assessment incorporates additional factors, including personal medical history, family history, and any available genetic information that may result in a personalized plan for cancer prevention and surveillance. Therefore, it is recommended she continue to follow the cancer management and screening guidelines provided by her oncology and primary healthcare provider.  RECOMMENDATIONS FOR FAMILY MEMBERS:   Since she did not inherit a mutation in a cancer predisposition gene included on this panel, her children could not have inherited a mutation from her in one of these genes. Individuals in this family might be at some increased risk of developing cancer, over the general population risk, due to the family history of cancer. We recommend women in this family have a yearly mammogram beginning at age 32, or 60 years younger than the earliest  onset of cancer, an annual clinical breast exam, and perform monthly breast self-exams.  FOLLOW-UP:  Cancer genetics is a rapidly advancing field and it is possible that new genetic tests will be appropriate for her and/or her family members in the future. We encouraged her to remain in contact with cancer genetics on an annual basis so we can update her personal and family histories and let her know of advances in cancer genetics that may benefit this family.   Our contact number was provided. Karen Vazquez questions were answered to her satisfaction, and she knows she is welcome to call us at anytime with additional questions or concerns.   Karen Passy, MS, [facility] Genetic Counselor [email or web address] (P) [phone]

## 2021-10-14 ENCOUNTER — Encounter: Payer: Self-pay | Admitting: Genetic Counselor

## 2021-10-15 ENCOUNTER — Other Ambulatory Visit: Payer: Self-pay

## 2021-10-15 ENCOUNTER — Encounter (HOSPITAL_BASED_OUTPATIENT_CLINIC_OR_DEPARTMENT_OTHER)
Admission: RE | Admit: 2021-10-15 | Discharge: 2021-10-15 | Disposition: A | Discharge status: Home / Self Care | Payer: Medicare Other | Source: Ambulatory Visit | Attending: Surgery | Admitting: Surgery

## 2021-10-15 DIAGNOSIS — Z01818 Encounter for other preprocedural examination: Secondary | ICD-10-CM | POA: Diagnosis present

## 2021-10-15 DIAGNOSIS — I10 Essential (primary) hypertension: Secondary | ICD-10-CM | POA: Diagnosis not present

## 2021-10-15 LAB — BASIC METABOLIC PANEL
Anion gap: 7 (ref 5–15)
BUN: 15 mg/dL (ref 8–23)
CO2: 27 mmol/L (ref 22–32)
Calcium: 9.3 mg/dL (ref 8.9–10.3)
Chloride: 107 mmol/L (ref 98–111)
Creatinine, Ser: 0.94 mg/dL (ref 0.44–1.00)
GFR, Estimated: >60 mL/min (ref >60)
Glucose, Bld: 88 mg/dL (ref 70–99)
Potassium: 4.6 mmol/L (ref 3.5–5.1)
Sodium: 141 mmol/L (ref 135–145)

## 2021-10-15 NOTE — Progress Notes (Signed)
  Pt given ensure presurgery (to drink 0515 DOS) and CHG soap. Written instruction provided for both. "DOS Checklist" instruction paper also provided. Pt verbalized understanding. Pt also presented with open wound between breasts which she thinks is recurring cyst. Called [facility] and asked them if they could work her in this afternoon for assessment and to see if tx for this may be necessary prior to surgery. Office made apt, pt going there now.    Enhanced Recovery after Surgery for Orthopedics Enhanced Recovery after Surgery is a protocol used to improve the stress on your body and your recovery after surgery.  Patient Instructions  The night before surgery:  No food after midnight. ONLY clear liquids after midnight  The day of surgery (if you do NOT have diabetes):  Drink ONE (1) Pre-Surgery Clear Ensure as directed.   This drink was given to you during your hospital  pre-op appointment visit. The pre-op nurse will instruct you on the time to drink the  Pre-Surgery Ensure depending on your surgery time. Finish the drink at the designated time by the pre-op nurse.  Nothing else to drink after completing the  Pre-Surgery Clear Ensure.  The day of surgery (if you have diabetes): Drink ONE (1) Gatorade 2 (G2) as directed. This drink was given to you during your hospital  pre-op appointment visit.  The pre-op nurse will instruct you on the time to drink the   Gatorade 2 (G2) depending on your surgery time. Color of the Gatorade may vary. Red is not allowed. Nothing else to drink after completing the  Gatorade 2 (G2).         If you have questions, please contact your surgeon's office.

## 2021-10-18 NOTE — Progress Notes (Unsigned)
Chief Complaint:   OBESITY Karen Vazquez is here to discuss her progress with her obesity treatment plan along with follow-up of her obesity related diagnoses. Karen Vazquez is on the Category 1 Plan and the Category 2 Plan with breakfast options and states she is following her eating plan approximately 0% of the time. Karen Vazquez states she is doing 0 minutes 0 times per week.  Today's visit was #: 20 Starting weight: 245 lbs Starting date: 08/20/2021 Today's weight: 224 lbs Today's date: 10/11/2021 Total lbs lost to date: 21 Total lbs lost since last in-office visit: 0  Interim History: Karen Vazquez is up 4 pounds since her last visit.  She is up about 2.2 pounds of water weight.  Subjective:   1. Other hyperlipidemia Karen Vazquez is currently taking omega-3.  2. Essential hypertension Karen Vazquez is currently taking Norvasc.  Assessment/Plan:   1. Other hyperlipidemia Karen Vazquez will continue taking omega-3.  She will continue to work on decreasing saturated fats, and increasing PUFAs and MUFAs.   2. Essential hypertension Karen Vazquez will continue her medications as directed, and we will follow-up on her blood pressure at her next visit.  3. Obesity, current with BMI of 03.4 Karen Vazquez is currently in the action stage of change. As such, her goal is to continue with weight loss efforts. She has agreed to the Category 1 Plan and the Category 2 Plan.   She will adhere closely to the plan 80-90%.  She will keep her water and protein intake high.  Exercise goals: No exercise has been prescribed at this time.  Behavioral modification strategies: increasing lean protein intake, decreasing simple carbohydrates, increasing vegetables, increasing water intake, decreasing eating out, no skipping meals, meal planning and cooking strategies, keeping healthy foods in the home, and planning for success.  Karen Vazquez has agreed to follow-up with our clinic in 3 to 4 weeks. She was informed of the importance of frequent follow-up visits to  maximize her success with intensive lifestyle modifications for her multiple health conditions.   Objective:   Blood pressure (!) 141/76, pulse 70, temperature 97.7 F (36.5 C), height '5\' 4"'$  (1.626 m), weight 224 lb (101.6 kg), last menstrual period 05/24/2010, SpO2 95 %. Body mass index is 38.45 kg/m.  General: Cooperative, alert, well developed, in no acute distress. HEENT: Conjunctivae and lids unremarkable. Cardiovascular: Regular rhythm.  Lungs: Normal work of breathing. Neurologic: No focal deficits.   Lab Results  Component Value Date   CREATININE 0.94 10/15/2021   BUN 15 10/15/2021   NA 141 10/15/2021   K 4.6 10/15/2021   CL 107 10/15/2021   CO2 27 10/15/2021   Lab Results  Component Value Date   ALT 14 09/15/2021   AST 19 09/15/2021   ALKPHOS 105 09/15/2021   BILITOT 0.5 09/15/2021   Lab Results  Component Value Date   HGBA1C 5.3 02/24/2021   HGBA1C 5.3 08/20/2020   HGBA1C 5.3 07/24/2018   HGBA1C 5.4 11/28/2017   HGBA1C 5.2 07/20/2017   Lab Results  Component Value Date   INSULIN 10.1 02/24/2021   INSULIN 19.2 08/20/2020   INSULIN 15.8 07/24/2018   INSULIN 7.8 11/28/2017   INSULIN 12.5 07/20/2017   Lab Results  Component Value Date   TSH 1.990 02/24/2021   Lab Results  Component Value Date   CHOL 180 02/24/2021   HDL 37 (L) 02/24/2021   LDLCALC 121 (H) 02/24/2021   TRIG 121 02/24/2021   CHOLHDL 5.1 (H) 07/20/2020   Lab Results  Component Value Date  VD25OH 43.9 02/24/2021   VD25OH 43.5 08/20/2020   VD25OH 32.1 02/06/2020   Lab Results  Component Value Date   WBC 6.4 09/15/2021   HGB 14.3 09/15/2021   HCT 44.7 09/15/2021   MCV 85.3 09/15/2021   PLT 160 09/15/2021   Lab Results  Component Value Date   IRON 102 02/24/2021   TIBC 405 02/24/2021   FERRITIN 32 02/24/2021   Attestation Statements:   Reviewed by clinician on day of visit: allergies, medications, problem list, medical history, surgical history, family history, social  history, and previous encounter notes.   Wilhemena Durie, am acting as Location manager for [employer], DO.  I have reviewed the above documentation for accuracy and completeness, and I agree with the above. Jearld Lesch, DO

## 2021-10-19 ENCOUNTER — Encounter (INDEPENDENT_AMBULATORY_CARE_PROVIDER_SITE_OTHER): Payer: Self-pay | Admitting: Bariatrics

## 2021-10-25 ENCOUNTER — Ambulatory Visit
Admission: RE | Admit: 2021-10-25 | Discharge: 2021-10-25 | Disposition: A | Discharge status: Home / Self Care | Payer: Medicare Other | Source: Ambulatory Visit | Attending: Surgery | Admitting: Surgery

## 2021-10-25 DIAGNOSIS — C50912 Malignant neoplasm of unspecified site of left female breast: Secondary | ICD-10-CM

## 2021-10-26 ENCOUNTER — Other Ambulatory Visit: Payer: Self-pay

## 2021-10-26 ENCOUNTER — Ambulatory Visit (HOSPITAL_BASED_OUTPATIENT_CLINIC_OR_DEPARTMENT_OTHER)
Admission: RE | Admit: 2021-10-26 | Discharge: 2021-10-26 | Disposition: A | Discharge status: Home / Self Care | Payer: Medicare Other | Attending: Surgery | Admitting: Surgery

## 2021-10-26 ENCOUNTER — Ambulatory Visit (HOSPITAL_COMMUNITY): Payer: Medicare Other

## 2021-10-26 ENCOUNTER — Ambulatory Visit (HOSPITAL_BASED_OUTPATIENT_CLINIC_OR_DEPARTMENT_OTHER): Payer: Medicare Other | Admitting: Anesthesiology

## 2021-10-26 ENCOUNTER — Ambulatory Visit
Admission: RE | Admit: 2021-10-26 | Discharge: 2021-10-26 | Disposition: A | Discharge status: Home / Self Care | Payer: Medicare Other | Source: Ambulatory Visit | Attending: Surgery | Admitting: Surgery

## 2021-10-26 ENCOUNTER — Encounter (HOSPITAL_BASED_OUTPATIENT_CLINIC_OR_DEPARTMENT_OTHER): Payer: Self-pay | Admitting: Surgery

## 2021-10-26 ENCOUNTER — Encounter (HOSPITAL_BASED_OUTPATIENT_CLINIC_OR_DEPARTMENT_OTHER)
Admission: RE | Disposition: A | Discharge status: Home / Self Care | Payer: Self-pay | Source: Home / Self Care | Attending: Surgery

## 2021-10-26 DIAGNOSIS — Z803 Family history of malignant neoplasm of breast: Secondary | ICD-10-CM | POA: Diagnosis not present

## 2021-10-26 DIAGNOSIS — Z170 Estrogen receptor positive status [ER+]: Secondary | ICD-10-CM | POA: Diagnosis not present

## 2021-10-26 DIAGNOSIS — I10 Essential (primary) hypertension: Secondary | ICD-10-CM | POA: Insufficient documentation

## 2021-10-26 DIAGNOSIS — K219 Gastro-esophageal reflux disease without esophagitis: Secondary | ICD-10-CM | POA: Insufficient documentation

## 2021-10-26 DIAGNOSIS — E119 Type 2 diabetes mellitus without complications: Secondary | ICD-10-CM | POA: Diagnosis not present

## 2021-10-26 DIAGNOSIS — C50412 Malignant neoplasm of upper-outer quadrant of left female breast: Secondary | ICD-10-CM | POA: Insufficient documentation

## 2021-10-26 DIAGNOSIS — Z8711 Personal history of peptic ulcer disease: Secondary | ICD-10-CM | POA: Insufficient documentation

## 2021-10-26 DIAGNOSIS — G4733 Obstructive sleep apnea (adult) (pediatric): Secondary | ICD-10-CM | POA: Insufficient documentation

## 2021-10-26 DIAGNOSIS — E039 Hypothyroidism, unspecified: Secondary | ICD-10-CM | POA: Diagnosis not present

## 2021-10-26 DIAGNOSIS — C50912 Malignant neoplasm of unspecified site of left female breast: Secondary | ICD-10-CM

## 2021-10-26 HISTORY — DX: Type 2 diabetes mellitus without complications: E11.9

## 2021-10-26 HISTORY — PX: BREAST LUMPECTOMY WITH RADIOACTIVE SEED AND SENTINEL LYMPH NODE BIOPSY: SHX6550

## 2021-10-26 HISTORY — PX: PORTACATH PLACEMENT: SHX2246

## 2021-10-26 HISTORY — PX: BREAST LUMPECTOMY: SHX2

## 2021-10-26 LAB — GLUCOSE, CAPILLARY
Glucose-Capillary: 63 mg/dL — ABNORMAL LOW (ref 70–99)
Glucose-Capillary: 100 mg/dL — ABNORMAL HIGH (ref 70–99)

## 2021-10-26 SURGERY — BREAST LUMPECTOMY WITH RADIOACTIVE SEED AND SENTINEL LYMPH NODE BIOPSY
Anesthesia: General | Site: Breast

## 2021-10-26 MED ORDER — PROPOFOL 10 MG/ML IV BOLUS
INTRAVENOUS | Status: DC | PRN
  Administered 2021-10-26: 150 mg via INTRAVENOUS
  Administered 2021-10-26 (×2): 50 mg via INTRAVENOUS

## 2021-10-26 MED ORDER — OXYCODONE HCL 5 MG PO TABS: 5.0000 mg | ORAL_TABLET | Freq: Once | ORAL | Status: DC | PRN

## 2021-10-26 MED ORDER — HEPARIN (PORCINE) IN NACL 1000-0.9 UT/500ML-% IV SOLN
INTRAVENOUS | Status: AC
  Filled 2021-10-26: qty 500

## 2021-10-26 MED ORDER — ROPIVACAINE HCL 5 MG/ML IJ SOLN
INTRAMUSCULAR | Status: DC | PRN
  Administered 2021-10-26: 30 mL via PERINEURAL

## 2021-10-26 MED ORDER — CEFAZOLIN SODIUM-DEXTROSE 2-4 GM/100ML-% IV SOLN
INTRAVENOUS | Status: AC
Start: 2021-10-26 — End: ?
  Filled 2021-10-26: qty 100

## 2021-10-26 MED ORDER — AMISULPRIDE (ANTIEMETIC) 5 MG/2ML IV SOLN: 10.0000 mg | Freq: Once | INTRAVENOUS | Status: DC | PRN

## 2021-10-26 MED ORDER — CHLORHEXIDINE GLUCONATE CLOTH 2 % EX PADS: 6.0000 | MEDICATED_PAD | Freq: Once | CUTANEOUS | Status: DC

## 2021-10-26 MED ORDER — FENTANYL CITRATE (PF) 100 MCG/2ML IJ SOLN
50.0000 ug | Freq: Once | INTRAMUSCULAR | Status: AC
  Administered 2021-10-26: 50 ug via INTRAVENOUS

## 2021-10-26 MED ORDER — HEPARIN SOD (PORK) LOCK FLUSH 100 UNIT/ML IV SOLN
INTRAVENOUS | Status: AC
Start: 2021-10-26 — End: ?
  Filled 2021-10-26: qty 5

## 2021-10-26 MED ORDER — FENTANYL CITRATE (PF) 100 MCG/2ML IJ SOLN
INTRAMUSCULAR | Status: AC
  Filled 2021-10-26: qty 2

## 2021-10-26 MED ORDER — FENTANYL CITRATE (PF) 100 MCG/2ML IJ SOLN
25.0000 ug | INTRAMUSCULAR | Status: DC | PRN
  Administered 2021-10-26: 50 ug via INTRAVENOUS

## 2021-10-26 MED ORDER — HEPARIN SOD (PORK) LOCK FLUSH 100 UNIT/ML IV SOLN
INTRAVENOUS | Status: DC | PRN
  Administered 2021-10-26: 500 [IU] via INTRAVENOUS

## 2021-10-26 MED ORDER — LACTATED RINGERS IV SOLN: INTRAVENOUS | Status: DC

## 2021-10-26 MED ORDER — CEFAZOLIN SODIUM-DEXTROSE 2-4 GM/100ML-% IV SOLN
2.0000 g | INTRAVENOUS | Status: AC
  Administered 2021-10-26: 2 g via INTRAVENOUS

## 2021-10-26 MED ORDER — DEXAMETHASONE SODIUM PHOSPHATE 4 MG/ML IJ SOLN
INTRAMUSCULAR | Status: DC | PRN
  Administered 2021-10-26: 4 mg via INTRAVENOUS

## 2021-10-26 MED ORDER — FENTANYL CITRATE (PF) 100 MCG/2ML IJ SOLN
INTRAMUSCULAR | Status: DC | PRN
  Administered 2021-10-26 (×2): 25 ug via INTRAVENOUS
  Administered 2021-10-26 (×3): 50 ug via INTRAVENOUS

## 2021-10-26 MED ORDER — BUPIVACAINE-EPINEPHRINE (PF) 0.25% -1:200000 IJ SOLN
INTRAMUSCULAR | Status: DC | PRN
  Administered 2021-10-26: 30 mL via PERINEURAL

## 2021-10-26 MED ORDER — LIDOCAINE HCL (CARDIAC) PF 100 MG/5ML IV SOSY
PREFILLED_SYRINGE | INTRAVENOUS | Status: DC | PRN
  Administered 2021-10-26: 100 mg via INTRAVENOUS

## 2021-10-26 MED ORDER — MIDAZOLAM HCL 2 MG/2ML IJ SOLN
1.0000 mg | Freq: Once | INTRAMUSCULAR | Status: AC
  Administered 2021-10-26: 1 mg via INTRAVENOUS

## 2021-10-26 MED ORDER — ONDANSETRON HCL 4 MG/2ML IJ SOLN: 4.0000 mg | Freq: Once | INTRAMUSCULAR | Status: DC | PRN

## 2021-10-26 MED ORDER — EPHEDRINE SULFATE (PRESSORS) 50 MG/ML IJ SOLN
INTRAMUSCULAR | Status: DC | PRN
  Administered 2021-10-26 (×3): 5 mg via INTRAVENOUS
  Administered 2021-10-26: 10 mg via INTRAVENOUS

## 2021-10-26 MED ORDER — OXYCODONE HCL 5 MG PO TABS: 5.0000 mg | ORAL_TABLET | Freq: Four times a day (QID) | ORAL | 0 refills | Status: DC | PRN

## 2021-10-26 MED ORDER — MIDAZOLAM HCL 2 MG/2ML IJ SOLN
INTRAMUSCULAR | Status: AC
  Filled 2021-10-26: qty 2

## 2021-10-26 MED ORDER — OXYCODONE HCL 5 MG/5ML PO SOLN: 5.0000 mg | Freq: Once | ORAL | Status: DC | PRN

## 2021-10-26 MED ORDER — ACETAMINOPHEN 500 MG PO TABS
ORAL_TABLET | ORAL | Status: AC
  Filled 2021-10-26: qty 2

## 2021-10-26 MED ORDER — SODIUM CHLORIDE 0.9 % IV SOLN
INTRAVENOUS | Status: DC | PRN
  Administered 2021-10-26: 500 mL

## 2021-10-26 MED ORDER — ACETAMINOPHEN 500 MG PO TABS
1000.0000 mg | ORAL_TABLET | Freq: Once | ORAL | Status: AC
Start: 2021-10-26 — End: 2021-10-26
  Administered 2021-10-26: 1000 mg via ORAL

## 2021-10-26 MED ORDER — LIDOCAINE 2% (20 MG/ML) 5 ML SYRINGE
INTRAMUSCULAR | Status: AC
  Filled 2021-10-26: qty 5

## 2021-10-26 MED ORDER — HEPARIN (PORCINE) IN NACL 2-0.9 UNITS/ML
INTRAMUSCULAR | Status: AC | PRN
  Administered 2021-10-26: 450 mL via INTRAVENOUS

## 2021-10-26 MED ORDER — ONDANSETRON HCL 4 MG/2ML IJ SOLN
INTRAMUSCULAR | Status: DC | PRN
  Administered 2021-10-26: 4 mg via INTRAVENOUS

## 2021-10-26 MED ORDER — IBUPROFEN 800 MG PO TABS: 800.0000 mg | ORAL_TABLET | Freq: Three times a day (TID) | ORAL | 0 refills | Status: DC | PRN

## 2021-10-26 MED ORDER — PHENYLEPHRINE HCL (PRESSORS) 10 MG/ML IV SOLN
INTRAVENOUS | Status: DC | PRN
  Administered 2021-10-26: 80 ug via INTRAVENOUS

## 2021-10-26 MED ORDER — MAGTRACE LYMPHATIC TRACER
INTRAMUSCULAR | Status: DC | PRN
  Administered 2021-10-26: 2 mL via INTRAMUSCULAR

## 2021-10-26 SURGICAL SUPPLY — 79 items
ADH SKN CLS APL DERMABOND .7 (GAUZE/BANDAGES/DRESSINGS) ×2
APL PRP STRL LF DISP 70% ISPRP (MISCELLANEOUS) ×2
APL SKNCLS STERI-STRIP NONHPOA (GAUZE/BANDAGES/DRESSINGS)
APPLIER CLIP 9.375 MED OPEN (MISCELLANEOUS) ×3
APR CLP MED 9.3 20 MLT OPN (MISCELLANEOUS) ×2
BAG DECANTER FOR FLEXI CONT (MISCELLANEOUS) ×4 IMPLANT
BENZOIN TINCTURE PRP APPL 2/3 (GAUZE/BANDAGES/DRESSINGS) IMPLANT
BINDER BREAST LRG (GAUZE/BANDAGES/DRESSINGS) IMPLANT
BINDER BREAST MEDIUM (GAUZE/BANDAGES/DRESSINGS) IMPLANT
BINDER BREAST XLRG (GAUZE/BANDAGES/DRESSINGS) IMPLANT
BINDER BREAST XXLRG (GAUZE/BANDAGES/DRESSINGS) IMPLANT
BLADE HEX COATED 2.75 (ELECTRODE) ×4 IMPLANT
BLADE SURG 11 STRL SS (BLADE) ×4 IMPLANT
BLADE SURG 15 STRL LF DISP TIS (BLADE) ×3 IMPLANT
BLADE SURG 15 STRL SS (BLADE) ×3
CANISTER SUC SOCK COL 7IN (MISCELLANEOUS) IMPLANT
CANISTER SUCT 1200ML W/VALVE (MISCELLANEOUS) ×4 IMPLANT
CHLORAPREP W/TINT 26 (MISCELLANEOUS) ×4 IMPLANT
CLIP APPLIE 9.375 MED OPEN (MISCELLANEOUS) ×3 IMPLANT
COVER BACK TABLE 60X90IN (DRAPES) ×4 IMPLANT
COVER MAYO STAND STRL (DRAPES) ×4 IMPLANT
COVER PROBE 5X48 (MISCELLANEOUS) ×3
COVER PROBE W GEL 5X96 (DRAPES) ×4 IMPLANT
COVER SURGICAL LIGHT HANDLE (MISCELLANEOUS) ×1 IMPLANT
DERMABOND ADVANCED (GAUZE/BANDAGES/DRESSINGS) ×1
DERMABOND ADVANCED .7 DNX12 (GAUZE/BANDAGES/DRESSINGS) ×3 IMPLANT
DRAPE C-ARM 42X72 X-RAY (DRAPES) ×4 IMPLANT
DRAPE LAPAROSCOPIC ABDOMINAL (DRAPES) ×4 IMPLANT
DRAPE UTILITY XL STRL (DRAPES) ×4 IMPLANT
DRSG TEGADERM 2-3/8X2-3/4 SM (GAUZE/BANDAGES/DRESSINGS) IMPLANT
DRSG TEGADERM 4X4.75 (GAUZE/BANDAGES/DRESSINGS) IMPLANT
ELECT COATED BLADE 2.86 ST (ELECTRODE) ×4 IMPLANT
ELECT REM PT RETURN 9FT ADLT (ELECTROSURGICAL) ×3
ELECTRODE REM PT RTRN 9FT ADLT (ELECTROSURGICAL) ×3 IMPLANT
GAUZE 4X4 16PLY ~~LOC~~+RFID DBL (SPONGE) ×4 IMPLANT
GAUZE SPONGE 4X4 12PLY STRL LF (GAUZE/BANDAGES/DRESSINGS) IMPLANT
GLOVE BIOGEL PI IND STRL 8 (GLOVE) ×3 IMPLANT
GLOVE BIOGEL PI INDICATOR 8 (GLOVE) ×1
GLOVE ECLIPSE 8.0 STRL XLNG CF (GLOVE) ×4 IMPLANT
GOWN STRL REUS W/ TWL LRG LVL3 (GOWN DISPOSABLE) ×6 IMPLANT
GOWN STRL REUS W/ TWL XL LVL3 (GOWN DISPOSABLE) ×3 IMPLANT
GOWN STRL REUS W/TWL LRG LVL3 (GOWN DISPOSABLE) ×6
GOWN STRL REUS W/TWL XL LVL3 (GOWN DISPOSABLE) ×3
HEMOSTAT ARISTA ABSORB 3G PWDR (HEMOSTASIS) ×1 IMPLANT
HEMOSTAT SNOW SURGICEL 2X4 (HEMOSTASIS) IMPLANT
IV KIT MINILOC 20X1 SAFETY (NEEDLE) IMPLANT
KIT CVR 48X5XPRB PLUP LF (MISCELLANEOUS) ×3 IMPLANT
KIT MARKER MARGIN INK (KITS) ×4 IMPLANT
KIT PORT POWER 8FR ISP CVUE (Port) ×1 IMPLANT
NDL HYPO 25X1 1.5 SAFETY (NEEDLE) ×3 IMPLANT
NDL SAFETY ECLIPSE 18X1.5 (NEEDLE) IMPLANT
NDL SPNL 22GX3.5 QUINCKE BK (NEEDLE) IMPLANT
NEEDLE HYPO 18GX1.5 SHARP (NEEDLE)
NEEDLE HYPO 22GX1.5 SAFETY (NEEDLE) IMPLANT
NEEDLE HYPO 25X1 1.5 SAFETY (NEEDLE) ×3 IMPLANT
NEEDLE SPNL 22GX3.5 QUINCKE BK (NEEDLE) IMPLANT
NS IRRIG 1000ML POUR BTL (IV SOLUTION) ×4 IMPLANT
PACK BASIN DAY SURGERY FS (CUSTOM PROCEDURE TRAY) ×4 IMPLANT
PENCIL SMOKE EVACUATOR (MISCELLANEOUS) ×4 IMPLANT
SET SHEATH INTRODUCER 10FR (MISCELLANEOUS) IMPLANT
SHEATH COOK PEEL AWAY SET 9F (SHEATH) IMPLANT
SLEEVE SCD COMPRESS KNEE MED (STOCKING) ×4 IMPLANT
SPIKE FLUID TRANSFER (MISCELLANEOUS) IMPLANT
SPONGE T-LAP 4X18 ~~LOC~~+RFID (SPONGE) ×4 IMPLANT
STRIP CLOSURE SKIN 1/2X4 (GAUZE/BANDAGES/DRESSINGS) IMPLANT
SUT MNCRL AB 4-0 PS2 18 (SUTURE) ×5 IMPLANT
SUT MON AB 4-0 PC3 18 (SUTURE) ×4 IMPLANT
SUT PROLENE 2 0 CT2 30 (SUTURE) IMPLANT
SUT PROLENE 2 0 SH DA (SUTURE) ×4 IMPLANT
SUT SILK 2 0 TIES 17X18 (SUTURE)
SUT SILK 2-0 18XBRD TIE BLK (SUTURE) IMPLANT
SUT VICRYL 3-0 CR8 SH (SUTURE) ×5 IMPLANT
SYR 5ML LUER SLIP (SYRINGE) ×4 IMPLANT
SYR CONTROL 10ML LL (SYRINGE) ×4 IMPLANT
TOWEL GREEN STERILE FF (TOWEL DISPOSABLE) ×8 IMPLANT
TRACER MAGTRACE VIAL (MISCELLANEOUS) IMPLANT
TRAY FAXITRON CT DISP (TRAY / TRAY PROCEDURE) ×4 IMPLANT
TUBE CONNECTING 20X1/4 (TUBING) ×4 IMPLANT
YANKAUER SUCT BULB TIP NO VENT (SUCTIONS) ×4 IMPLANT

## 2021-10-26 NOTE — Anesthesia Postprocedure Evaluation (Signed)
Anesthesia Post Note  Patient: Karen Vazquez  Procedure(s) Performed: LEFT BREAST SEED LUMPECTOMY WITH LEFT SENTINEL LYMPH NODE MAPPING (Left: Breast) PORT PLACEMENT WITH ULTRASOUND GUIDANCE     Patient location during evaluation: PACU Anesthesia Type: General Level of consciousness: awake and alert Pain management: pain level controlled Vital Signs Assessment: post-procedure vital signs reviewed and stable Respiratory status: spontaneous breathing, nonlabored ventilation and respiratory function stable Cardiovascular status: blood pressure returned to baseline and stable Postop Assessment: no apparent nausea or vomiting Anesthetic complications: no   No notable events documented.  Last Vitals:  Vitals:   10/26/21 1325 10/26/21 1336  BP:  (!) 165/96  Pulse: 62 71  Resp: 14 18  Temp:  (!) 36.3 C  SpO2: 94% 96%    Last Pain:  Vitals:   10/26/21 1336  TempSrc: Oral  PainSc: 0-No pain                 Lidia Collum

## 2021-10-26 NOTE — Transfer of Care (Signed)
Immediate Anesthesia Transfer of Care Note  Patient: Karen Vazquez  Procedure(s) Performed: LEFT BREAST SEED LUMPECTOMY WITH LEFT SENTINEL LYMPH NODE MAPPING (Left: Breast) PORT PLACEMENT WITH ULTRASOUND GUIDANCE  Patient Location: PACU  Anesthesia Type:GA combined with regional for post-op pain  Level of Consciousness: awake, alert  and oriented  Airway & Oxygen Therapy: Patient Spontanous Breathing and Patient connected to face mask oxygen  Post-op Assessment: Report given to RN and Post -op Vital signs reviewed and stable  Post vital signs: Reviewed and stable  Last Vitals:  Vitals Value Taken Time  BP    Temp    Pulse 74 10/26/21 1221  Resp 12 10/26/21 1221  SpO2 99 % 10/26/21 1221  Vitals shown include unvalidated device data.  Last Pain:  Vitals:   10/26/21 0739  TempSrc: Oral  PainSc: 0-No pain         Complications: No notable events documented.

## 2021-10-26 NOTE — H&P (Signed)
Chief Complaint: Breast Cancer   History of Present Illness: Karen Vazquez is a 65 y.o. female who is seen today as an office consultation for evaluation of Breast Cancer .   Patient presents to the [facility] for evaluation of newly diagnosed left breast cancer. 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 She does have a family history of breast cancer. No history of breast pain nipple discharge or breast mass.  Review of Systems: A complete review of systems was obtained from the patient. I have reviewed this information and discussed as appropriate with the patient. See HPI as well for other ROS.    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   Past Surgical History:  Procedure Laterality Date  HYSTERECTOMY VAGINAL N/A 12/29/2010  Left total knee arthroplasty Left 11/17/2014  COLONOSCOPY N/A 03/03/2016    Allergies  Allergen Reactions  Lisinopril Cough   Current Outpatient Medications on File Prior to Visit  Medication Sig Dispense Refill  albuterol 90 mcg/actuation inhaler Inhale 2 inhalations into the lungs every 6 (six) hours as needed for Wheezing or Shortness of Breath  ALPRAZolam (XANAX) 0.5 MG tablet Take 0.5 mg by mouth 2 (two) times daily as needed for Anxiety or Sleep  amLODIPine  (NORVASC) 2.5 MG tablet Take 2.5 mg by mouth once daily  ARMOUR THYROID 15 mg tablet Take 1 tablet by mouth once daily  ARMOUR THYROID 30 mg tablet Take 1 tablet by mouth once daily  cholecalciferol (VITAMIN D3) 2,000 unit capsule Take 2,000 Units by mouth once daily  fluticasone propionate (FLONASE) 50 mcg/actuation nasal spray Place 2 sprays into both nostrils once daily  hydrOXYzine (ATARAX) 25 MG tablet Take 25 mg by mouth at bedtime as needed (insomnia)  metFORMIN (GLUCOPHAGE) 500 MG tablet Take 500 mg by mouth 2 (two) times daily with meals  oxyBUTYnin (DITROPAN-XL) 5 MG XL tablet Take 5 mg by mouth once daily  pantoprazole (PROTONIX) 40 MG DR tablet Take 40 mg by mouth once daily  pedi multivit 43-iron fumarate (FLINTSTONES COMPLETE, IRON,) 18 mg iron Chew Take 1 tablet by mouth once daily  pregabalin (LYRICA) 50 MG capsule Take 50 mg by mouth every evening  VITAMIN B COMPLEX ORAL Take 1 tablet by mouth once daily   No current facility-administered medications on file prior to visit.   Family History  Problem Relation Age of Onset  Hyperlipidemia (Elevated cholesterol) Mother  High blood pressure (Hypertension) Mother  Stroke Mother  Skin cancer Father  High blood pressure (Hypertension) Brother    Social History   Tobacco Use  Smoking Status Never  Smokeless Tobacco Never    Social History   Socioeconomic History  Marital status: Married  Tobacco Use  Smoking status: Never  Smokeless tobacco: Never  Vaping Use  Vaping Use: Never used  Substance and Sexual Activity  Alcohol use: Never  Drug use: Never  Sexual activity: Defer   Objective:  There were no vitals filed for this visit.  There is no height or weight on file to calculate BMI.  Physical Exam HENT:  Head: Normocephalic.  Cardiovascular:  Rate and Rhythm: Normal rate.  Pulmonary:  Effort: Pulmonary effort is normal.  Breath sounds: No stridor.  Chest:  Breasts: Right: Normal. No swelling or  mass.  Left: No swelling, bleeding or mass.  Comments: Mild bruising left breast. Musculoskeletal:  General: Normal range of motion.  Cervical back: Normal range of motion.  Lymphadenopathy:  Upper Body:  Right upper body: No supraclavicular or axillary adenopathy.  Left upper body: No supraclavicular or axillary adenopathy.  Skin: General: Skin is warm.  Neurological:  Mental Status: She is alert.    Labs, Imaging and Diagnostic Testing: Mammogram, ultrasound reviewed today. She has a 1.4 cm mass left breast upper inner quadrant ER positive PR negative HER2/neu positive at 3+ and a Ki-67 of 85%.  Assessment and Plan:   Diagnoses and all orders for this visit:  Malignant neoplasm of upper-outer quadrant of left breast in female, estrogen receptor positive (CMS-HCC)    Patient seen and evaluated today in the multidisciplinary clinic. We discussed breast conserving surgery with left breast lumpectomy seed localized, left axillary sentinel lymph node mapping and port placement. Discussed procedures were with her as well as pros and cons of breast conserving surgery versus mastectomy and reconstruction and long-term expectations of each. Discussed the use of dye for sentinel lymph node mapping of possible discoloration of the breast. Discussed port placement and potential complications of bleeding, infection, pneumothorax, hemothorax, cardiovascular injury and less likely and rarely death. We discussed alternatives to access. She is opted for left breast seed localized lumpectomy, left axillary sentinel lymph node mapping and port placement followed by chemotherapy. Discussed risk lumpectomy of bleeding, infection, cosmetic deformity and the need for reexcision. Discussed sentinel lymph node mapping and risk of lymphedema being around 5% up to 20%, nerve injury, blood vessel injury, numbness, shoulder stiffness, the need for physical therapy.  No follow-ups on file.  Kennieth Francois, MD

## 2021-10-26 NOTE — Progress Notes (Signed)
Assisted Dr. Witman with left, pectoralis, ultrasound guided block. Side rails up, monitors on throughout procedure. See vital signs in flow sheet. Tolerated Procedure well. ?

## 2021-10-26 NOTE — Anesthesia Procedure Notes (Signed)
Anesthesia Regional Block: Pectoralis block   Pre-Anesthetic Checklist: , timeout performed,  Correct Patient, Correct Site, Correct Laterality,  Correct Procedure, Correct Position, site marked,  Risks and benefits discussed,  Surgical consent,  Pre-op evaluation,  At surgeon's request and post-op pain management  Laterality: Left  Prep: chloraprep       Needles:  Injection technique: Single-shot  Needle Type: Echogenic Stimulator Needle     Needle Length: 10cm  Needle Gauge: 20     Additional Needles:   Procedures:,,,, ultrasound used (permanent image in chart),,    Narrative:  Start time: 10/26/2021 8:25 AM End time: 10/26/2021 8:29 AM Injection made incrementally with aspirations every 5 mL.  Performed by: Personally  Anesthesiologist: Lidia Collum, MD

## 2021-10-26 NOTE — Discharge Instructions (Addendum)
[place] Office Phone Number [phone]  BREAST BIOPSY/ PARTIAL MASTECTOMY: POST OP INSTRUCTIONS  Always review your discharge instruction sheet given to you by the facility where your surgery was performed.  IF YOU HAVE DISABILITY OR FAMILY LEAVE FORMS, YOU MUST BRING THEM TO THE OFFICE FOR PROCESSING.  DO NOT GIVE THEM TO YOUR DOCTOR.  A prescription for pain medication may be given to you upon discharge.  Take your pain medication as prescribed, if needed.  If narcotic pain medicine is not needed, then you may take acetaminophen (Tylenol) or ibuprofen (Advil) as needed. Take your usually prescribed medications unless otherwise directed If you need a refill on your pain medication, please contact your pharmacy.  They will contact our office to request authorization.  Prescriptions will not be filled after 5pm or on week-ends. You should eat very light the first 24 hours after surgery, such as soup, crackers, pudding, etc.  Resume your normal diet the day after surgery. Most patients will experience some swelling and bruising in the breast.  Ice packs and a good support bra will help.  Swelling and bruising can take several days to resolve.  It is common to experience some constipation if taking pain medication after surgery.  Increasing fluid intake and taking a stool softener will usually help or prevent this problem from occurring.  A mild laxative (Milk of Magnesia or Miralax) should be taken according to package directions if there are no bowel movements after 48 hours. Unless discharge instructions indicate otherwise, you may remove your bandages 24-48 hours after surgery, and you may shower at that time.  You may have steri-strips (small skin tapes) in place directly over the incision.  These strips should be left on the skin for 7-10 days.  If your surgeon used skin glue on the incision, you may shower in 24 hours.  The glue will flake off over the next 2-3 weeks.  Any  sutures or staples will be removed at the office during your follow-up visit. ACTIVITIES:  You may resume regular daily activities (gradually increasing) beginning the next day.  Wearing a good support bra or sports bra minimizes pain and swelling.  You may have sexual intercourse when it is comfortable. You may drive when you no longer are taking prescription pain medication, you can comfortably wear a seatbelt, and you can safely maneuver your car and apply brakes. RETURN TO WORK:  ______________________________________________________________________________________ Dennis Bast should see your doctor in the office for a follow-up appointment approximately two weeks after your surgery.  Your doctor's nurse will typically make your follow-up appointment when she calls you with your pathology report.  Expect your pathology report 2-3 business days after your surgery.  You may call to check if you do not hear from [place] after three days. OTHER INSTRUCTIONS: _______________________________________________________________________________________________ _____________________________________________________________________________________________________________________________________ _____________________________________________________________________________________________________________________________________ _____________________________________________________________________________________________________________________________________  WHEN TO CALL YOUR DOCTOR: Fever over 101.0 Nausea and/or vomiting. Extreme swelling or bruising. Continued bleeding from incision. Increased pain, redness, or drainage from the incision.  The clinic staff is available to answer your questions during regular business hours.  Please don't hesitate to call and ask to speak to one of the nurses for clinical concerns.  If you have a medical emergency, go to the nearest emergency room or call 911.  A surgeon from [facility] is always on call at the hospital.              PORT-A-CATH: POST OP INSTRUCTIONS  Always review your discharge instruction sheet given  to you by the facility where your surgery was performed.   A prescription for pain medication may be given to you upon discharge. Take your pain medication as prescribed, if needed. If narcotic pain medicine is not needed, then you make take acetaminophen (Tylenol) or ibuprofen (Advil) as needed.  Take your usually prescribed medications unless otherwise directed. If you need a refill on your pain medication, please contact our office. All narcotic pain medicine now requires a paper prescription.  Phoned in and fax refills are no longer allowed by law.  Prescriptions will not be filled after 5 pm or on weekends.  You should follow a light diet for the remainder of the day after your procedure. Most patients will experience some mild swelling and/or bruising in the area of the incision. It may take several days to resolve. It is common to experience some constipation if taking pain medication after surgery. Increasing fluid intake and taking a stool softener (such as Colace) will usually help or prevent this problem from occurring. A mild laxative (Milk of Magnesia or Miralax) should be taken according to package directions if there are no bowel movements after 48 hours.  Unless discharge instructions indicate otherwise, you may remove your bandages 48 hours after surgery, and you may shower at that time. You may have steri-strips (small white skin tapes) in place directly over the incision.  These strips should be left on the skin for 7-10 days.  If your surgeon used Dermabond (skin glue) on the incision, you may shower in 24 hours.  The glue will flake off over the next 2-3 weeks.  If your port is left accessed at the end of surgery (needle left in port), the dressing cannot get wet and should only by changed by a healthcare professional.  When the port is no longer accessed (when the needle has been removed), follow step 7.   ACTIVITIES:  Limit activity involving your arms for the next 72 hours. Do no strenuous exercise or activity for 1 week. You may drive when you are no longer taking prescription pain medication, you can comfortably wear a seatbelt, and you can maneuver your car. 10.You may need to see your doctor in the office for a follow-up appointment.  Please       check with your doctor.  11.When you receive a new Port-a-Cath, you will get a product guide and        ID card.  Please keep them in case you need them.  WHEN TO CALL YOUR DOCTOR [phone]): Fever over 101.0 Chills Continued bleeding from incision Increased redness and tenderness at the site Shortness of breath, difficulty breathing   The clinic staff is available to answer your questions during regular business hours. Please don't hesitate to call and ask to speak to one of the nurses or medical assistants for clinical concerns. If you have a medical emergency, go to the nearest emergency room or call 911.  A surgeon from [facility] is always on call at the hospital.     For further information, please visit [email or web address]     For further questions, please visit [email or web address]     Post Anesthesia Home Care Instructions  Activity: Get plenty of rest for the remainder of the day. A responsible individual must stay with you for 24 hours following the procedure.  For the next 24 hours, DO NOT: -Drive a car -Paediatric nurse -Drink alcoholic beverages -Take any medication unless instructed by your physician -Make any legal  decisions or sign important papers.  Meals: Start with liquid foods such as gelatin or soup. Progress to regular foods as tolerated. Avoid greasy, spicy, heavy foods. If nausea and/or vomiting occur, drink only clear liquids until the nausea and/or vomiting subsides. Call your  physician if vomiting continues.  Special Instructions/Symptoms: Your throat may feel dry or sore from the anesthesia or the breathing tube placed in your throat during surgery. If this causes discomfort, gargle with warm salt water. The discomfort should disappear within 24 hours.  If you had a scopolamine patch placed behind your ear for the management of post- operative nausea and/or vomiting:  1. The medication in the patch is effective for 72 hours, after which it should be removed.  Wrap patch in a tissue and discard in the trash. Wash hands thoroughly with soap and water. 2. You may remove the patch earlier than 72 hours if you experience unpleasant side effects which may include dry mouth, dizziness or visual disturbances. 3. Avoid touching the patch. Wash your hands with soap and water after contact with the patch.     May have Tylenol at 2pm today 10/26/21

## 2021-10-26 NOTE — Anesthesia Procedure Notes (Signed)
Procedure Name: LMA Insertion Date/Time: 10/26/2021 10:04 AM  Performed by: Maryella Shivers, CRNAPre-anesthesia Checklist: Patient identified, Emergency Drugs available, Suction available and Patient being monitored Patient Re-evaluated:Patient Re-evaluated prior to induction Oxygen Delivery Method: Circle system utilized Preoxygenation: Pre-oxygenation with 100% oxygen Induction Type: IV induction Ventilation: Mask ventilation without difficulty LMA: LMA inserted LMA Size: 4.0 Number of attempts: 1 Airway Equipment and Method: Bite block Placement Confirmation: positive ETCO2 Tube secured with: Tape Dental Injury: Teeth and Oropharynx as per pre-operative assessment

## 2021-10-26 NOTE — Anesthesia Preprocedure Evaluation (Signed)
Anesthesia Evaluation  Patient identified by MRN, date of birth, ID band Patient awake    Reviewed: Allergy & Precautions, NPO status , Patient's Chart, lab work & pertinent test results  History of Anesthesia Complications Negative for: history of anesthetic complications  Airway Mallampati: II  TM Distance: >3 FB Neck ROM: Full    Dental  (+) Dental Advisory Given, Teeth Intact   Pulmonary sleep apnea ,    Pulmonary exam normal        Cardiovascular hypertension, Normal cardiovascular exam     Neuro/Psych negative neurological ROS     GI/Hepatic Neg liver ROS, PUD, GERD  ,  Endo/Other  diabetesHypothyroidism   Renal/GU negative Renal ROS  negative genitourinary   Musculoskeletal negative musculoskeletal ROS (+)   Abdominal   Peds  Hematology negative hematology ROS (+)   Anesthesia Other Findings Left breast cancer  Reproductive/Obstetrics                             Anesthesia Physical Anesthesia Plan  ASA: 3  Anesthesia Plan: General   Post-op Pain Management: Regional block*, Tylenol PO (pre-op)* and Toradol IV (intra-op)*   Induction: Intravenous  PONV Risk Score and Plan: 3 and Ondansetron, Dexamethasone, Midazolam and Treatment may vary due to age or medical condition  Airway Management Planned: LMA  Additional Equipment: None  Intra-op Plan:   Post-operative Plan: Extubation in OR  Informed Consent: I have reviewed the patients History and Physical, chart, labs and discussed the procedure including the risks, benefits and alternatives for the proposed anesthesia with the patient or authorized representative who has indicated his/her understanding and acceptance.     Dental advisory given  Plan Discussed with:   Anesthesia Plan Comments:         Anesthesia Quick Evaluation

## 2021-10-26 NOTE — Op Note (Signed)
Preoperative diagnosis: Stage I left breast cancer upper outer quadrant  Postoperative diagnosis: Same  Procedure: Left breast seed localized lumpectomy with left axillary sentinel lymph node mapping using mag trace in placement of an 8 French right internal jugular Port-A-Cath under fluoroscopic and ultrasound guidance  Surgeon: Erroll Luna, MD  Anesthesia: General with pectoral block and 0.25% Marcaine with epinephrine  EBL: 30 cc  Drains: None  Specimen: Left breast tissue with seed and clip verified by Faxitron and for a left axillary sentinel nodes hot  IV fluids: Per anesthesia record  Indications for procedure: The patient is a pleasant 67 year old female with stage I left breast cancer.  She presents for breast conserving surgery after being seen by medical and radiation oncologist along with surgery.  She requires a Port-A-Cath for postoperative chemotherapy.  All questions were answered.  The procedure was reviewed.  Options of mastectomy with reconstruction and radiation after lumpectomy were all discussed with the patient.  She agreed to proceed with breast conserving surgery.  Risk of Port-A-Cath insertion include but not exclusive of bleeding, infection, pneumothorax, hemothorax, injury to mediastinal structures, injury to structures in the neck, catheter migration, catheter malfunction, catheter thrombosis, and the need further treatments and/or procedures.The procedure has been discussed with the patient. Alternatives to surgery have been discussed with the patient.  Risks of surgery include bleeding,  Infection,  Seroma formation, death,  and the need for further surgery.   The patient understands and wishes to proceed. Sentinel lymph node mapping and dissection has been discussed with the patient.  Risk of bleeding,  Infection,  Seroma formation,  Additional procedures,,  Shoulder weakness ,  Shoulder stiffness, skin staining from the dye nerve and blood vessel injury and  reaction to the mapping dyes have been discussed.  Alternatives to surgery have been discussed with the patient.  The patient agrees to proceed.     Description of procedure: The patient was seen in the holding area.  A pectoral block was placed on the left side.  All questions were answered.  Of note she underwent seed placement as an outpatient.  She was then brought back to the operating room.  She was placed supine upon the OR table.  After induction of general anesthesia, the right neck and right subclavian region were prepped and draped in sterile fashion for port placement.  A timeout was performed.  She received appropriate preoperative antibiotics.  She was placed in Trendelenburg.  The ultrasound was used to identify the right internal jugular vein.  Needle was advanced under ultrasound guidance into the vein with the return of dark nonpulsatile blood.  The wire was fed through this without difficulty.  C arm was done which showed the wire extending down into the inferior vena cava.  We made a small stab incision there.  A small incision was made just with the clavicle of about 4 cm.  Local anesthetic was infiltrated throughout.  Dissection was carried down to the subcutaneous fat and a pocket was formed.  We then flushed the port.  We attached the port.  It was tunneled from the lower incision to the wire exit site.  It was cut to 20 cm.  With the patient Trendelenburg I slid the dilator introducer over the wire moving the wire to and fro.  Once this was in place without any resistance I remove the dilator in wire.  Catheter was placed and the peel-away sheath was peeled away.  C arm showed the tip to be  in the mid to distal SVC.  There is no pneumothorax or hemothorax I could see.  The catheter drew back dark nonpulsatile blood easily.  It flushed easily.  50 units/cc of hep saline was placed into the catheter.  The catheter was secured to the chest wall with 2-0 Prolene.  Skin incisions closed with  4 Monocryl.  Dermabond applied.  The patient was then repositioned reprepped and redraped in sterile fashion.  Additional timeout was performed.  Left side was marked as correct site.  2 cc of mag tracer injected in the subareolar plexus and massaged for 5 minutes.  Once this was done the neoprobe was used to identify the seed in the left breast upper outer quadrant.  Curvilinear incision was made of the signal in the left breast upper outer quadrant.  Dissection was carried down and all tissue around the seed and clip were excised with a grossly negative margin.  The tissue is inked and the Faxitron revealed the seed and clip to be present.  Cavities irrigated.  Hemostasis achieved with cautery.  Local anesthetic infiltrated throughout.  Clips were used to mark the cavity.  The cavities then closed with 3-0 Vicryl.  4 Monocryl was used to close the skin in a subcuticular fashion.  The mag trace probe was used.  A hotspot identified in the left axilla was noted.  This was marked with marking pen.  Incision was made over this.  Dissection was carried down into the level 1 contents.  There were 4 nodes that had taken up the tracer.  These were removed together.  Background counts were noted and these were bed baseline.  There were no spikes of activity.  The long thoracic nerve, thoracodorsal trunk and extra vein were preserved.  The cavities made hemostatic with cautery and clips.  Arista was placed.  There is no signs of active bleeding.  We then closed the cavity with 3-0 Vicryl.  4 Monocryl was used to close the skin in a subcuticular fashion.  Dermabond applied to all.  Breast binder placed.  All counts were found to be correct.  The patient was then awoke extubated taken to recovery in satisfactory condition.

## 2021-10-26 NOTE — Interval H&P Note (Signed)
History and Physical Interval Note:  08/21/7856 8:50 AM  Karen Vazquez  has presented today for surgery, with the diagnosis of LEFT BREAST CANCER.  The various methods of treatment have been discussed with the patient and family. After consideration of risks, benefits and other options for treatment, the patient has consented to  Procedure(s): LEFT BREAST SEED LUMPECTOMY WITH LEFT SENTINEL LYMPH NODE MAPPING (Left) PORT PLACEMENT WITH ULTRASOUND GUIDANCE (N/A) as a surgical intervention.  The patient's history has been reviewed, patient examined, no change in status, stable for surgery.  I have reviewed the patient's chart and labs.  Questions were answered to the patient's satisfaction.     [place]

## 2021-10-27 ENCOUNTER — Encounter (INDEPENDENT_AMBULATORY_CARE_PROVIDER_SITE_OTHER): Payer: Self-pay

## 2021-10-27 ENCOUNTER — Encounter (HOSPITAL_BASED_OUTPATIENT_CLINIC_OR_DEPARTMENT_OTHER): Payer: Self-pay | Admitting: Surgery

## 2021-10-28 ENCOUNTER — Encounter: Payer: Self-pay | Admitting: Family Medicine

## 2021-10-29 ENCOUNTER — Encounter: Payer: Self-pay | Admitting: Surgery

## 2021-11-01 ENCOUNTER — Encounter: Payer: Self-pay | Admitting: *Deleted

## 2021-11-02 ENCOUNTER — Inpatient Hospital Stay: Payer: Medicare Other

## 2021-11-02 ENCOUNTER — Other Ambulatory Visit: Payer: Self-pay

## 2021-11-02 ENCOUNTER — Inpatient Hospital Stay: Payer: Medicare Other | Attending: Hematology and Oncology | Admitting: Hematology and Oncology

## 2021-11-02 VITALS — BP 122/58 | HR 80 | Temp 97.9°F | Resp 18 | Ht 64.0 in | Wt 230.1 lb

## 2021-11-02 DIAGNOSIS — Z79899 Other long term (current) drug therapy: Secondary | ICD-10-CM | POA: Insufficient documentation

## 2021-11-02 DIAGNOSIS — Z79890 Hormone replacement therapy (postmenopausal): Secondary | ICD-10-CM | POA: Insufficient documentation

## 2021-11-02 DIAGNOSIS — Z170 Estrogen receptor positive status [ER+]: Secondary | ICD-10-CM | POA: Insufficient documentation

## 2021-11-02 DIAGNOSIS — C50412 Malignant neoplasm of upper-outer quadrant of left female breast: Secondary | ICD-10-CM | POA: Diagnosis present

## 2021-11-02 MED ORDER — ONDANSETRON HCL 8 MG PO TABS: 8.0000 mg | ORAL_TABLET | Freq: Three times a day (TID) | ORAL | 1 refills | Status: DC | PRN

## 2021-11-02 MED ORDER — PROCHLORPERAZINE MALEATE 10 MG PO TABS: 10.0000 mg | ORAL_TABLET | Freq: Four times a day (QID) | ORAL | 1 refills | Status: DC | PRN

## 2021-11-02 MED ORDER — LIDOCAINE-PRILOCAINE 2.5-2.5 % EX CREA: TOPICAL_CREAM | CUTANEOUS | 3 refills | Status: DC

## 2021-11-02 NOTE — Progress Notes (Signed)
START ON PATHWAY REGIMEN - Breast ° ° °  Cycle 1: A cycle is 7 days: °    Trastuzumab-xxxx  °    Paclitaxel  °  Cycles 2 through 12: A cycle is every 7 days: °    Trastuzumab-xxxx  °    Paclitaxel  °  Cycles 13 through 25: A cycle is every 21 days: °    Trastuzumab-xxxx  ° °**Always confirm dose/schedule in your pharmacy ordering system** ° °Patient Characteristics: °Postoperative without Neoadjuvant Therapy (Pathologic Staging), Invasive Disease, Adjuvant Therapy, HER2 Positive, ER Positive, Node Negative, pT1c, pN0/N1mi °Therapeutic Status: Postoperative without Neoadjuvant Therapy (Pathologic Staging) °AJCC Grade: G3 °AJCC N Category: pN0 °AJCC M Category: cM0 °ER Status: Positive (+) °AJCC 8 Stage Grouping: IA °HER2 Status: Positive (+) °Oncotype Dx Recurrence Score: Not Appropriate °AJCC T Category: pT1c °PR Status: Negative (-) °Adjuvant Therapy Status: No Adjuvant Therapy Received Yet or Changing Initial Adjuvant Regimen due to Tolerance °Intent of Therapy: °Curative Intent, Discussed with Patient °

## 2021-11-02 NOTE — Assessment & Plan Note (Signed)
09/07/2021:Screening mammogram detected left breast mass by ultrasound 2 o'clock position measured 1.4 cm axilla was negative.  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 I discussed the margins as well as lymph node surgeries. We also discussed the final staging along with previously performed ER/PR and HER-2/neu testing.  Treatment plan: 1. adjuvant chemotherapy with Taxol Herceptin weekly x12 followed by Herceptin maintenance every 3 weeks for 1 year 2.  Adjuvant radiation therapy 3.  Adjuvant antiestrogen therapy  Return to clinic in 3 weeks to start chemotherapy.

## 2021-11-02 NOTE — Progress Notes (Signed)
Patient Care Team: Kathyrn Drown, MD as PCP - General (Family Medicine) Rockwell Germany, RN as Oncology Nurse Navigator Mauro Kaufmann, RN as Oncology Nurse Navigator Erroll Luna, MD as Consulting Physician (General Surgery) Nicholas Lose, MD as Consulting Physician (Hematology and Oncology) Eppie Gibson, MD as Attending Physician (Radiation Oncology)  DIAGNOSIS:  Encounter Diagnosis  Name Primary?   Malignant neoplasm of upper-outer quadrant of left breast in female, estrogen receptor positive ([place]) Yes    SUMMARY OF ONCOLOGIC HISTORY: Oncology History  Malignant neoplasm of upper-outer quadrant of left breast in female, estrogen receptor positive ([place])  09/07/2021 Initial Diagnosis   Screening mammogram detected left breast mass by ultrasound 2 o'clock position measured 1.4 cm axilla was negative.  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 Report date is 10/04/2021.  The CustomNext-Cancer+RNAinsight panel offered by [facility] includes sequencing and rearrangement analysis for the following 47 genes:  APC, ATM, AXIN2, BARD1, BMPR1A, BRCA1, BRCA2, BRIP1, CDH1, CDK4, CDKN2A, CHEK2, CTNNA1, DICER1, EPCAM, GREM1, HOXB13, KIT, MEN1, MLH1, MSH2, MSH3, MSH6, MUTYH, NBN, NF1, NTHL1, PALB2, PDGFRA, PMS2, POLD1, POLE, PTEN, RAD50, RAD51C, RAD51D, SDHA, SDHB, SDHC, SDHD, SMAD4, SMARCA4, STK11, TP53, TSC1, TSC2, and VHL.  RNA data is routinely analyzed for use in variant interpretation for all genes.   11/24/2021 -  Chemotherapy   Patient is on Treatment Plan : BREAST Paclitaxel + Trastuzumab q7d / Trastuzumab q21d        CHIEF COMPLIANT: Follow-up after Left Lumpectomy    INTERVAL HISTORY: Karen Vazquez is a 66 y.o with the above mention. She presents to the clinic today for a follow-up after Left Lumpectomy. She states that the surgery went good, still have some soreness under the arm.   ALLERGIES:  is allergic to lisinopril.  MEDICATIONS:  Current Outpatient Medications  Medication Sig Dispense Refill   ALPRAZolam (XANAX) 0.5 MG tablet TAKE ONE TABLET BY MOUTH TWO TIMES DAILYAS NEEDED FOR ANXIETY OR SLEEP. 30 tablet 5   amLODipine (NORVASC) 2.5 MG tablet Take 1 tablet (2.5 mg total) by mouth daily. 30 tablet 5   ARMOUR THYROID 15 MG tablet TAKE ONE TABLET ONCE DAILY (Patient taking differently: TAKES TOTAL 45MG DAILY, TAKES 30 MG) 30 tablet 5   Cholecalciferol (VITAMIN D) 50 MCG (2000 UT) CAPS Take by mouth.     ibuprofen (ADVIL) 800 MG tablet Take 1 tablet (800 mg total) by mouth every 8 (eight) hours as needed. 30 tablet 0   OVER THE COUNTER MEDICATION Vitamin b12     oxybutynin (DITROPAN-XL) 5 MG 24 hr tablet Take by mouth.     pantoprazole (PROTONIX) 40 MG tablet Take 1 tablet (40 mg total) by mouth daily. 30 tablet 5   Pediatric Multiple Vit-C-FA (FLINSTONES GUMMIES OMEGA-3 DHA) CHEW Chew 18 mg by mouth daily.     pregabalin (LYRICA) 50 MG capsule 1 each evening 30 capsule 5   thyroid (ARMOUR THYROID) 30 MG tablet TAKE ONE (1) TABLET BY MOUTH EVERY DAY 30 tablet 5   lidocaine-prilocaine (EMLA) cream Apply to affected area once 30 g 3   ondansetron (ZOFRAN) 8 MG  tablet Take 1 tablet (8 mg total) by mouth every 8 (eight) hours as needed for nausea or vomiting. 30 tablet 1   prochlorperazine (COMPAZINE) 10 MG tablet Take 1 tablet (10 mg total) by mouth every 6 (six) hours as needed for nausea or vomiting. 30 tablet 1   No current facility-administered medications for this visit.    PHYSICAL EXAMINATION: ECOG PERFORMANCE STATUS: 1 - Symptomatic but completely ambulatory  Vitals:    11/02/21 1528  BP: (!) 122/58  Pulse: 80  Resp: 18  Temp: 97.9 F (36.6 C)  SpO2: 96%   Filed Weights   11/02/21 1528  Weight: 230 lb 1.6 oz (104.4 kg)     LABORATORY DATA:  I have reviewed the data as listed    Latest Ref Rng & Units 10/15/2021    2:09 PM 09/15/2021   11:58 AM 02/24/2021   10:18 AM  CMP  Glucose 70 - 99 mg/dL 88  93  79   BUN 8 - 23 mg/dL 15  14  12    Creatinine 0.44 - 1.00 mg/dL 0.94  1.04  0.83   Sodium 135 - 145 mmol/L 141  141  143   Potassium 3.5 - 5.1 mmol/L 4.6  4.7  4.8   Chloride 98 - 111 mmol/L 107  105  101   CO2 22 - 32 mmol/L 27  32  28   Calcium 8.9 - 10.3 mg/dL 9.3  10.0  9.9   Total Protein 6.5 - 8.1 g/dL  7.2  6.8   Total Bilirubin 0.3 - 1.2 mg/dL  0.5  0.5   Alkaline Phos 38 - 126 U/L  105  116   AST 15 - 41 U/L  19  19   ALT 0 - 44 U/L  14  18     Lab Results  Component Value Date   WBC 6.4 09/15/2021   HGB 14.3 09/15/2021   HCT 44.7 09/15/2021   MCV 85.3 09/15/2021   PLT 160 09/15/2021   NEUTROABS 3.3 09/15/2021    ASSESSMENT & PLAN:  Malignant neoplasm of upper-outer quadrant of left breast in female, estrogen receptor positive ([place]) 09/07/2021:Screening mammogram detected left breast mass by ultrasound 2 o'clock position measured 1.4 cm axilla was negative.  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 I discussed the margins as well as lymph node surgeries. We also discussed the final staging along with previously performed ER/PR and HER-2/neu testing.  Treatment plan: 1. adjuvant chemotherapy with Taxol Herceptin weekly x12 followed by Herceptin maintenance every 3 weeks for 1 year 2.  Adjuvant  radiation therapy 3.  Adjuvant antiestrogen therapy  Return to clinic in 3 weeks to start chemotherapy.   Orders Placed This Encounter  Procedures   Consent Attestation for Oncology Treatment    Order Specific Question:   The patient is informed of risks, benefits, side-effects of the prescribed oncology treatment. Potential short term and long term side effects and response rates discussed. After a long discussion, the patient made informed decision to proceed.    Answer:   Yes   CBC with Differential (Cancer Center Only)    Standing Status:   Future  Standing Expiration Date:   11/25/2022   CMP ([place] only)    Standing Status:   Future    Standing Expiration Date:   11/25/2022   CBC with Differential (Cancer Center Only)    Standing Status:   Future    Standing Expiration Date:   12/02/2022   CMP ([place] only)    Standing Status:   Future    Standing Expiration Date:   12/02/2022   CBC with Differential (Cancer Center Only)    Standing Status:   Future    Standing Expiration Date:   12/09/2022   CMP ([place] only)    Standing Status:   Future    Standing Expiration Date:   12/09/2022   CBC with Differential ([place] Only)    Standing Status:   Future    Standing Expiration Date:   12/16/2022   CMP ([place] only)    Standing Status:   Future    Standing Expiration Date:   12/16/2022   CBC with Differential ([place] Only)    Standing Status:   Future    Standing Expiration Date:   12/23/2022   CMP ([place] only)    Standing Status:   Future    Standing Expiration Date:   12/23/2022   CBC with Differential ([place] Only)    Standing Status:   Future    Standing Expiration Date:   12/30/2022   CMP ([place] only)    Standing Status:   Future    Standing Expiration Date:   12/30/2022   CBC with Differential ([place] Only)    Standing Status:   Future    Standing Expiration Date:   01/06/2023   CMP ([place] only)     Standing Status:   Future    Standing Expiration Date:   01/06/2023   CBC with Differential ([place] Only)    Standing Status:   Future    Standing Expiration Date:   01/13/2023   CMP ([place] only)    Standing Status:   Future    Standing Expiration Date:   01/13/2023   PHYSICIAN COMMUNICATION ORDER    A baseline Echo/[place] should be obtained prior to initiation of Trastuzumab, at 3, 6, 9 months during Trastuzumab treatment.   ONCBCN PHYSICIAN COMMUNICATION 1    Trastuzumab SQ options for each treatment day starting with q21d  maintenance C4 available via "Add Orders".   The patient has a good understanding of the overall plan. she agrees with it. she will call with any problems that may develop before the next visit here. Total time spent: 30 mins including face to face time and time spent for planning, charting and co-ordination of care   Harriette Ohara, MD 11/02/21    I Gardiner Coins am scribing for Dr. Lindi Adie  I have reviewed the above documentation for accuracy and completeness, and I agree with the above.

## 2021-11-03 ENCOUNTER — Other Ambulatory Visit: Payer: Self-pay

## 2021-11-04 NOTE — Progress Notes (Signed)
TeleHealth Visit:  This visit was completed with telemedicine (audio/video) technology. Karen Vazquez has verbally consented to this TeleHealth visit. The patient is located at home, the provider is located at home. The participants in this visit include the listed provider and patient. The visit was conducted today via MyChart video.  OBESITY Karen Vazquez is here to discuss her progress with her obesity treatment plan along with follow-up of her obesity related diagnoses.   Today's visit was # 21 Starting weight: 245 lbs Starting date: 08/20/2020 Weight at last in office visit: 224 lbs on 10/11/21 Total weight loss: 21 lbs at last in office visit on 10/11/21. Today's reported weight:  No weight reported.  Nutrition Plan: .  Has not been following any particular plan but has been trying to increase protein, decrease carbohydrates, and increase water intake. Hunger is well controlled.  Current exercise: walking for 30 minutes 6-7 days per week  Interim History: Karen Vazquez had left breast lumpectomy, removal of sentinel nodes, insertion of Port-A-Cath on August 8 due to recent diagnosis of breast cancer.  Lymph nodes were negative for cancer.  She will start chemo and Herceptin on September 6 for 12 weeks.  She has not been following any particular plan but is working on getting in adequate protein.  Her daughters have been encouraging her to walk and they walk for 30 minutes almost every day of the week. Her family is extremely supportive. She feels she would like to take a hiatus from our program while she is completing chemo which ends in December.  Assessment/Plan:  1. Prediabetes/insulin resistance This recent A1c was 5.3 on 02/24/2021.  A1c was in the prediabetic zone in September 2014 (5.8). Medication(s): Recently discontinued metformin because she did not want to deal with any side effects while also contending with breast cancer. Lab Results  Component Value Date   HGBA1C 5.3 02/24/2021   Lab  Results  Component Value Date   INSULIN 10.1 02/24/2021   INSULIN 19.2 08/20/2020   INSULIN 15.8 07/24/2018   INSULIN 7.8 11/28/2017   INSULIN 12.5 07/20/2017    Plan: Remain off of metformin. Limit simple carbohydrates.   2.  Malignant neoplasm of upper outer quadrant of left breast, estrogen receptor positive Status post lumpectomy, sentinel node biopsy, and insertion Port-A-Cath on August 8.  Doing well.  Pain is minimal.  Has follow-up with Dr. Brantley Stage (general surgeon) soon. Will start 12 weekly chemo and Herceptin treatments on September 6. 1. adjuvant chemotherapy with Taxol Herceptin weekly x12 followed by Herceptin maintenance every 3 weeks for 1 year 2.  Adjuvant radiation therapy 3.  Adjuvant antiestrogen therapy  Plan: She will follow-up with oncology for all treatment.  She will work on continuing healthy diet in order to maintain weight during treatment.  3. Obesity: Current BMI 02.7 Karen Vazquez is currently in the action stage of change. As such, her goal is to maintain weight for now.  She has agreed to practicing portion control and making smarter food choices, such as increasing vegetables and decreasing simple carbohydrates.  Goal is to maintain weight over the next 3 to 4 months.  Exercise goals: Continue daily walking.  Behavioral modification strategies: increasing lean protein intake, decreasing simple carbohydrates, and increasing water intake.  Karen Vazquez has agreed to follow-up with our clinic in 3-4 months. She will call to make an appointment for January sometime in December.  No orders of the defined types were placed in this encounter.   Medications Discontinued During This Encounter  Medication Reason  ibuprofen (ADVIL) 800 MG tablet Patient Preference     No orders of the defined types were placed in this encounter.     Objective:   VITALS: Per patient if applicable, see vitals. GENERAL: Alert and in no acute distress. CARDIOPULMONARY: No  increased WOB. Speaking in clear sentences.  PSYCH: Pleasant and cooperative. Speech normal rate and rhythm. Affect is appropriate. Insight and judgement are appropriate. Attention is focused, linear, and appropriate.  NEURO: Oriented as arrived to appointment on time with no prompting.   Lab Results  Component Value Date   CREATININE 0.94 10/15/2021   BUN 15 10/15/2021   NA 141 10/15/2021   K 4.6 10/15/2021   CL 107 10/15/2021   CO2 27 10/15/2021   Lab Results  Component Value Date   ALT 14 09/15/2021   AST 19 09/15/2021   ALKPHOS 105 09/15/2021   BILITOT 0.5 09/15/2021   Lab Results  Component Value Date   HGBA1C 5.3 02/24/2021   HGBA1C 5.3 08/20/2020   HGBA1C 5.3 07/24/2018   HGBA1C 5.4 11/28/2017   HGBA1C 5.2 07/20/2017   Lab Results  Component Value Date   INSULIN 10.1 02/24/2021   INSULIN 19.2 08/20/2020   INSULIN 15.8 07/24/2018   INSULIN 7.8 11/28/2017   INSULIN 12.5 07/20/2017   Lab Results  Component Value Date   TSH 1.990 02/24/2021   Lab Results  Component Value Date   CHOL 180 02/24/2021   HDL 37 (L) 02/24/2021   LDLCALC 121 (H) 02/24/2021   TRIG 121 02/24/2021   CHOLHDL 5.1 (H) 07/20/2020   Lab Results  Component Value Date   WBC 6.4 09/15/2021   HGB 14.3 09/15/2021   HCT 44.7 09/15/2021   MCV 85.3 09/15/2021   PLT 160 09/15/2021   Lab Results  Component Value Date   IRON 102 02/24/2021   TIBC 405 02/24/2021   FERRITIN 32 02/24/2021   Lab Results  Component Value Date   VD25OH 43.9 02/24/2021   VD25OH 43.5 08/20/2020   VD25OH 32.1 02/06/2020    Attestation Statements:   Reviewed by clinician on day of visit: allergies, medications, problem list, medical history, surgical history, family history, social history, and previous encounter notes.  Time spent on visit including the items listed below was 35 minutes.  -preparing to see the patient (e.g., review of tests, history, previous notes) -obtaining and/or reviewing separately  obtained history -counseling and educating the patient/family/caregiver -documenting clinical information in the electronic or other health record

## 2021-11-05 ENCOUNTER — Encounter: Payer: Self-pay | Admitting: Hematology and Oncology

## 2021-11-05 ENCOUNTER — Inpatient Hospital Stay: Payer: Medicare Other

## 2021-11-05 ENCOUNTER — Inpatient Hospital Stay: Payer: Medicare Other | Admitting: Hematology and Oncology

## 2021-11-05 ENCOUNTER — Encounter: Payer: Self-pay | Admitting: *Deleted

## 2021-11-05 NOTE — Addendum Note (Signed)
Addended by: Tora Kindred on: 11/05/2021 03:54 PM   Modules accepted: Orders

## 2021-11-08 ENCOUNTER — Telehealth (INDEPENDENT_AMBULATORY_CARE_PROVIDER_SITE_OTHER): Payer: Medicare Other | Admitting: Family Medicine

## 2021-11-08 ENCOUNTER — Encounter (INDEPENDENT_AMBULATORY_CARE_PROVIDER_SITE_OTHER): Payer: Self-pay | Admitting: Family Medicine

## 2021-11-08 DIAGNOSIS — E669 Obesity, unspecified: Secondary | ICD-10-CM

## 2021-11-08 DIAGNOSIS — C50412 Malignant neoplasm of upper-outer quadrant of left female breast: Secondary | ICD-10-CM

## 2021-11-08 DIAGNOSIS — R7303 Prediabetes: Secondary | ICD-10-CM | POA: Diagnosis not present

## 2021-11-08 DIAGNOSIS — Z170 Estrogen receptor positive status [ER+]: Secondary | ICD-10-CM

## 2021-11-08 DIAGNOSIS — Z6838 Body mass index (BMI) 38.0-38.9, adult: Secondary | ICD-10-CM

## 2021-11-10 ENCOUNTER — Encounter: Payer: Self-pay | Admitting: *Deleted

## 2021-11-11 ENCOUNTER — Telehealth: Payer: Self-pay | Admitting: *Deleted

## 2021-11-11 NOTE — Telephone Encounter (Signed)
PA received from insurance for pantoprazole '40mg'$ . Per insurance patient's plan will only cover 90 tablets of a PPI every 365 days (not for continuous daily use beyond that)   Please advise

## 2021-11-11 NOTE — Telephone Encounter (Signed)
1.  This is a common issue with PPIs.  Often insurance companies will only cover it for so many days per year.  If she feels like she needs a medicine ongoing she would have to consider utilizing OTC PPI such as omeprazole or Nexium or paying for Protonix out-of-pocket and shopping around for the best price  Often in the situations we will utilize H2 blocker such as prescription Pepcid It is also advisable to minimize foods that trigger reflux including caffeine's chocolates and tomato based products  She has a follow-up visit in October but we are more than happy to do a follow-up visit sooner if she would like to discuss all of this in full detail or discuss additional options If she would like to try the H2 blocker I recommend Pepcid 40 mg 1 daily #30 with 5 refills Please remember PPIs and H2 blockers do not stop regurgitation but they reduce acid to lessen the burning from regurgitation Thank

## 2021-11-12 NOTE — Telephone Encounter (Signed)
Patient advised of provider's recommendations and stated she rarely takes the pantoprazole anymore, maybe once a week and she thinks she will be fine and can discuss further in October and will call back sooner if any problems or if she would like the script for Pepcid.

## 2021-11-24 ENCOUNTER — Encounter (HOSPITAL_COMMUNITY): Payer: Self-pay

## 2021-11-24 NOTE — Progress Notes (Signed)
Patient Care Team: Kathyrn Drown, MD as PCP - General (Family Medicine) Rockwell Germany, RN as Oncology Nurse Navigator Mauro Kaufmann, RN as Oncology Nurse Navigator Erroll Luna, MD as Consulting Physician (General Surgery) Nicholas Lose, MD as Consulting Physician (Hematology and Oncology) Eppie Gibson, MD as Attending Physician (Radiation Oncology)  DIAGNOSIS:  Encounter Diagnoses  Name Primary?   Malignant neoplasm of upper-outer quadrant of left breast in female, estrogen receptor positive ([place])    Encounter for monitoring cardiotoxic drug therapy Yes    SUMMARY OF ONCOLOGIC HISTORY: Oncology History  Malignant neoplasm of upper-outer quadrant of left breast in female, estrogen receptor positive ([place])  09/07/2021 Initial Diagnosis   Screening mammogram detected left breast mass by ultrasound 2 o'clock position measured 1.4 cm axilla was negative.  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 Report date is 10/04/2021.  The CustomNext-Cancer+RNAinsight panel offered by [facility] includes sequencing and rearrangement analysis for the following 47 genes:  APC, ATM, AXIN2, BARD1, BMPR1A, BRCA1, BRCA2, BRIP1, CDH1, CDK4, CDKN2A, CHEK2, CTNNA1, DICER1, EPCAM, GREM1, HOXB13, KIT, MEN1, MLH1, MSH2, MSH3, MSH6, MUTYH, NBN, NF1, NTHL1, PALB2, PDGFRA, PMS2, POLD1, POLE, PTEN, RAD50, RAD51C, RAD51D, SDHA, SDHB, SDHC, SDHD, SMAD4, SMARCA4, STK11, TP53, TSC1, TSC2, and VHL.  RNA data is routinely analyzed for use in variant interpretation for all genes.   11/25/2021 -  Chemotherapy   Patient is on Treatment Plan :  BREAST Paclitaxel + Trastuzumab q7d / Trastuzumab q21d       CHIEF COMPLIANT: Follow-up after Left Lumpectomy cycle 1 day 1 taxol  INTERVAL HISTORY: Karen Vazquez is a 66 y.o with the above mention. She presents to the clinic today for a follow-up and to start treatment. She states that she is ready for treatment today. She is a little anxious.   ALLERGIES:  is allergic to lisinopril.  MEDICATIONS:  Current Outpatient Medications  Medication Sig Dispense Refill   ALPRAZolam (XANAX) 0.5 MG tablet TAKE ONE TABLET BY MOUTH TWO TIMES DAILYAS NEEDED FOR ANXIETY OR SLEEP. 30 tablet 5   amLODipine (NORVASC) 2.5 MG tablet Take 1 tablet (2.5 mg total) by mouth daily. 30 tablet 5   ARMOUR THYROID 15 MG tablet TAKE ONE TABLET ONCE DAILY (Patient taking differently: TAKES TOTAL 45MG DAILY, TAKES 30 MG) 30 tablet 5   Cholecalciferol (VITAMIN D) 50 MCG (2000 UT) CAPS Take by mouth.     lidocaine-prilocaine (EMLA) cream Apply to affected area once 30 g 3   ondansetron (ZOFRAN) 8 MG tablet Take 1 tablet (8 mg total) by mouth every 8 (eight) hours as needed for nausea or vomiting. 30 tablet 1   OVER THE COUNTER MEDICATION Vitamin b12     oxybutynin (DITROPAN-XL) 5 MG 24 hr tablet Take by mouth.     pantoprazole (PROTONIX) 40 MG tablet Take 1 tablet (40 mg total) by mouth daily. 30 tablet 5   Pediatric Multiple Vit-C-FA (FLINSTONES GUMMIES OMEGA-3 DHA) CHEW Chew 18 mg by mouth daily.     pregabalin (LYRICA) 50 MG capsule 1 each evening 30 capsule 5   prochlorperazine (COMPAZINE) 10 MG tablet Take  1 tablet (10 mg total) by mouth every 6 (six) hours as needed for nausea or vomiting. 30 tablet 1   thyroid (ARMOUR THYROID) 30 MG tablet TAKE ONE (1) TABLET BY MOUTH EVERY DAY 30 tablet 5   No current facility-administered medications for this visit.   Facility-Administered Medications Ordered in Other Visits  Medication Dose Route Frequency Provider Last Rate Last Admin   0.9 %  sodium chloride infusion    Intravenous Once Nicholas Lose, MD       acetaminophen (TYLENOL) tablet 650 mg  650 mg Oral Once Nicholas Lose, MD       cetirizine (QUZYTTIR) injection 10 mg  10 mg Intravenous Once Nicholas Lose, MD       dexamethasone (DECADRON) 10 mg in sodium chloride 0.9 % 50 mL IVPB  10 mg Intravenous Once Nicholas Lose, MD       famotidine (PEPCID) IVPB 20 mg premix  20 mg Intravenous Once Nicholas Lose, MD       heparin lock flush 100 unit/mL  500 Units Intracatheter Once PRN Nicholas Lose, MD       PACLitaxel (TAXOL) 174 mg in sodium chloride 0.9 % 250 mL chemo infusion (</= 5m/m2)  80 mg/m2 (Treatment Plan Recorded) Intravenous Once GNicholas Lose MD       sodium chloride flush (NS) 0.9 % injection 10 mL  10 mL Intracatheter PRN GNicholas Lose MD       trastuzumab-dkst (OGIVRI) 420 mg in sodium chloride 0.9 % 250 mL chemo infusion  4 mg/kg (Treatment Plan Recorded) Intravenous Once GNicholas Lose MD        PHYSICAL EXAMINATION: ECOG PERFORMANCE STATUS: 1 - Symptomatic but completely ambulatory  Vitals:   11/25/21 1211  BP: (!) 147/93  Pulse: 66  Resp: 18  Temp: (!) 97.3 F (36.3 C)  SpO2: 97%   Filed Weights   11/25/21 1211  Weight: 231 lb (104.8 kg)     LABORATORY DATA:  I have reviewed the data as listed    Latest Ref Rng & Units 11/25/2021   11:30 AM 10/15/2021    2:09 PM 09/15/2021   11:58 AM  CMP  Glucose 70 - 99 mg/dL 91  88  93   BUN 8 - 23 mg/dL 12  15  14    Creatinine 0.44 - 1.00 mg/dL 0.87  0.94  1.04   Sodium 135 - 145 mmol/L 140  141  141   Potassium 3.5 - 5.1 mmol/L 3.9  4.6  4.7   Chloride 98 - 111 mmol/L 108  107  105   CO2 22 - 32 mmol/L 28  27  32   Calcium 8.9 - 10.3 mg/dL 9.2  9.3  10.0   Total Protein 6.5 - 8.1 g/dL 6.7   7.2   Total Bilirubin 0.3 - 1.2 mg/dL 0.4   0.5   Alkaline Phos 38 - 126 U/L 107   105   AST 15 - 41 U/L 15   19   ALT 0 - 44 U/L 10   14     Lab Results  Component Value Date   WBC 5.7 11/25/2021   HGB 12.5 11/25/2021   HCT 38.0  11/25/2021   MCV 81.0 11/25/2021   PLT 134 (L) 11/25/2021   NEUTROABS 2.8 11/25/2021    ASSESSMENT & PLAN:  Malignant neoplasm of upper-outer quadrant of left breast in female, estrogen receptor positive ([place] 09/07/2021:Screening mammogram detected left breast mass by ultrasound 2 o'clock position measured 1.4 cm axilla  was negative.  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 adjuvant chemotherapy with Taxol Herceptin weekly x12 followed by Herceptin maintenance every 3 weeks for 1 year 2.  Adjuvant radiation therapy 3.  Adjuvant antiestrogen therapy ----------------------------------------------------------------------------------------------------------------------------- Current treatment: Cycle 1 day 1 Taxol Herceptin Labs reviewed, chemo education completed, chemo consent obtained, adverse effects were discussed again and antiemetics were also discussed.  Return to clinic in 1 week for toxicity check    Orders Placed This Encounter  Procedures   ECHOCARDIOGRAM COMPLETE    Standing Status:   Future    Standing Expiration Date:   11/26/2022    Order Specific Question:   Where should this test be performed    Answer:   [facility]    Order Specific Question:   Perflutren DEFINITY (image enhancing agent) should be administered unless hypersensitivity or allergy exist    Answer:   Administer Perflutren    Order Specific Question:   Reason for exam-Echo    Answer:   Chemo  Z09    Order Specific Question:   Other Comments    Answer:   pt. needs autherization. call pt. to schedule.   The patient has a good understanding of the overall plan. she agrees with it. she will call with any problems that may develop before the next visit  here. Total time spent: 30 mins including face to face time and time spent for planning, charting and co-ordination of care   Harriette Ohara, MD 11/25/21    I Gardiner Coins am scribing for Dr. Lindi Adie  I have reviewed the above documentation for accuracy and completeness, and I agree with the above.

## 2021-11-25 ENCOUNTER — Encounter: Payer: Self-pay | Admitting: *Deleted

## 2021-11-25 ENCOUNTER — Inpatient Hospital Stay: Payer: Medicare Other

## 2021-11-25 ENCOUNTER — Inpatient Hospital Stay: Payer: Medicare Other | Attending: Hematology and Oncology

## 2021-11-25 ENCOUNTER — Other Ambulatory Visit: Payer: Self-pay

## 2021-11-25 ENCOUNTER — Inpatient Hospital Stay (HOSPITAL_BASED_OUTPATIENT_CLINIC_OR_DEPARTMENT_OTHER): Payer: Medicare Other | Admitting: Hematology and Oncology

## 2021-11-25 ENCOUNTER — Encounter: Payer: Self-pay | Admitting: Hematology and Oncology

## 2021-11-25 VITALS — BP 139/82 | HR 72 | Temp 98.1°F | Resp 18

## 2021-11-25 VITALS — BP 147/93 | HR 66 | Temp 97.3°F | Resp 18 | Ht 64.0 in | Wt 231.0 lb

## 2021-11-25 DIAGNOSIS — E1136 Type 2 diabetes mellitus with diabetic cataract: Secondary | ICD-10-CM | POA: Diagnosis not present

## 2021-11-25 DIAGNOSIS — Z800 Family history of malignant neoplasm of digestive organs: Secondary | ICD-10-CM | POA: Insufficient documentation

## 2021-11-25 DIAGNOSIS — Z5111 Encounter for antineoplastic chemotherapy: Secondary | ICD-10-CM | POA: Insufficient documentation

## 2021-11-25 DIAGNOSIS — Z170 Estrogen receptor positive status [ER+]: Secondary | ICD-10-CM

## 2021-11-25 DIAGNOSIS — Z8042 Family history of malignant neoplasm of prostate: Secondary | ICD-10-CM | POA: Insufficient documentation

## 2021-11-25 DIAGNOSIS — Z5181 Encounter for therapeutic drug level monitoring: Secondary | ICD-10-CM

## 2021-11-25 DIAGNOSIS — R197 Diarrhea, unspecified: Secondary | ICD-10-CM | POA: Diagnosis not present

## 2021-11-25 DIAGNOSIS — Z5189 Encounter for other specified aftercare: Secondary | ICD-10-CM | POA: Insufficient documentation

## 2021-11-25 DIAGNOSIS — K219 Gastro-esophageal reflux disease without esophagitis: Secondary | ICD-10-CM | POA: Diagnosis not present

## 2021-11-25 DIAGNOSIS — Z79890 Hormone replacement therapy (postmenopausal): Secondary | ICD-10-CM | POA: Insufficient documentation

## 2021-11-25 DIAGNOSIS — E039 Hypothyroidism, unspecified: Secondary | ICD-10-CM | POA: Insufficient documentation

## 2021-11-25 DIAGNOSIS — Z79899 Other long term (current) drug therapy: Secondary | ICD-10-CM

## 2021-11-25 DIAGNOSIS — C50412 Malignant neoplasm of upper-outer quadrant of left female breast: Secondary | ICD-10-CM | POA: Diagnosis not present

## 2021-11-25 DIAGNOSIS — M1990 Unspecified osteoarthritis, unspecified site: Secondary | ICD-10-CM | POA: Diagnosis not present

## 2021-11-25 DIAGNOSIS — Z803 Family history of malignant neoplasm of breast: Secondary | ICD-10-CM | POA: Diagnosis not present

## 2021-11-25 DIAGNOSIS — I10 Essential (primary) hypertension: Secondary | ICD-10-CM | POA: Insufficient documentation

## 2021-11-25 DIAGNOSIS — Z7952 Long term (current) use of systemic steroids: Secondary | ICD-10-CM | POA: Diagnosis not present

## 2021-11-25 DIAGNOSIS — B3749 Other urogenital candidiasis: Secondary | ICD-10-CM | POA: Insufficient documentation

## 2021-11-25 DIAGNOSIS — Z95828 Presence of other vascular implants and grafts: Secondary | ICD-10-CM | POA: Insufficient documentation

## 2021-11-25 LAB — CMP (CANCER CENTER ONLY)
ALT: 10 U/L (ref 0–44)
AST: 15 U/L (ref 15–41)
Albumin: 4 g/dL (ref 3.5–5.0)
Alkaline Phosphatase: 107 U/L (ref 38–126)
Anion gap: 4 — ABNORMAL LOW (ref 5–15)
BUN: 12 mg/dL (ref 8–23)
CO2: 28 mmol/L (ref 22–32)
Calcium: 9.2 mg/dL (ref 8.9–10.3)
Chloride: 108 mmol/L (ref 98–111)
Creatinine: 0.87 mg/dL (ref 0.44–1.00)
GFR, Estimated: >60 mL/min (ref >60)
Glucose, Bld: 91 mg/dL (ref 70–99)
Potassium: 3.9 mmol/L (ref 3.5–5.1)
Sodium: 140 mmol/L (ref 135–145)
Total Bilirubin: 0.4 mg/dL (ref 0.3–1.2)
Total Protein: 6.7 g/dL (ref 6.5–8.1)

## 2021-11-25 LAB — CBC WITH DIFFERENTIAL (CANCER CENTER ONLY)
Abs Immature Granulocytes: 0.01 10*3/uL (ref 0.00–0.07)
Basophils Absolute: 0 10*3/uL (ref 0.0–0.1)
Basophils Relative: 0 %
Eosinophils Absolute: 0.4 10*3/uL (ref 0.0–0.5)
Eosinophils Relative: 7 %
HCT: 38 % (ref 36.0–46.0)
Hemoglobin: 12.5 g/dL (ref 12.0–15.0)
Immature Granulocytes: 0 %
Lymphocytes Relative: 36 %
Lymphs Abs: 2.1 10*3/uL (ref 0.7–4.0)
MCH: 26.7 pg (ref 26.0–34.0)
MCHC: 32.9 g/dL (ref 30.0–36.0)
MCV: 81 fL (ref 80.0–100.0)
Monocytes Absolute: 0.4 10*3/uL (ref 0.1–1.0)
Monocytes Relative: 8 %
Neutro Abs: 2.8 10*3/uL (ref 1.7–7.7)
Neutrophils Relative %: 49 %
Platelet Count: 134 10*3/uL — ABNORMAL LOW (ref 150–400)
RBC: 4.69 MIL/uL (ref 3.87–5.11)
RDW: 14.6 % (ref 11.5–15.5)
WBC Count: 5.7 10*3/uL (ref 4.0–10.5)
nRBC: 0 % (ref 0.0–0.2)

## 2021-11-25 MED ORDER — SODIUM CHLORIDE 0.9% FLUSH
10.0000 mL | Freq: Once | INTRAVENOUS | Status: AC
  Administered 2021-11-25: 10 mL

## 2021-11-25 MED ORDER — CETIRIZINE HCL 10 MG/ML IV SOLN
10.0000 mg | Freq: Once | INTRAVENOUS | Status: AC
  Administered 2021-11-25: 10 mg via INTRAVENOUS
  Filled 2021-11-25: qty 1

## 2021-11-25 MED ORDER — SODIUM CHLORIDE 0.9 % IV SOLN
80.0000 mg/m2 | Freq: Once | INTRAVENOUS | Status: AC
  Administered 2021-11-25: 174 mg via INTRAVENOUS
  Filled 2021-11-25: qty 29

## 2021-11-25 MED ORDER — FAMOTIDINE IN NACL 20-0.9 MG/50ML-% IV SOLN
20.0000 mg | Freq: Once | INTRAVENOUS | Status: AC
  Administered 2021-11-25: 20 mg via INTRAVENOUS
  Filled 2021-11-25: qty 50

## 2021-11-25 MED ORDER — ACETAMINOPHEN 325 MG PO TABS
650.0000 mg | ORAL_TABLET | Freq: Once | ORAL | Status: AC
  Administered 2021-11-25: 650 mg via ORAL
  Filled 2021-11-25: qty 2

## 2021-11-25 MED ORDER — HEPARIN SOD (PORK) LOCK FLUSH 100 UNIT/ML IV SOLN
500.0000 [IU] | Freq: Once | INTRAVENOUS | Status: AC | PRN
  Administered 2021-11-25: 500 [IU]

## 2021-11-25 MED ORDER — TRASTUZUMAB-DKST CHEMO 150 MG IV SOLR
4.0000 mg/kg | Freq: Once | INTRAVENOUS | Status: AC
  Administered 2021-11-25: 420 mg via INTRAVENOUS
  Filled 2021-11-25: qty 20

## 2021-11-25 MED ORDER — SODIUM CHLORIDE 0.9 % IV SOLN: Freq: Once | INTRAVENOUS | Status: AC

## 2021-11-25 MED ORDER — SODIUM CHLORIDE 0.9 % IV SOLN
10.0000 mg | Freq: Once | INTRAVENOUS | Status: AC
  Administered 2021-11-25: 10 mg via INTRAVENOUS
  Filled 2021-11-25: qty 10

## 2021-11-25 MED ORDER — SODIUM CHLORIDE 0.9% FLUSH
10.0000 mL | INTRAVENOUS | Status: DC | PRN
  Administered 2021-11-25: 10 mL

## 2021-11-25 NOTE — Patient Instructions (Signed)
[facility]  Discharge Instructions: Thank you for choosing [facility] to provide your oncology and hematology care.   If you have a lab appointment with the [facility], please go directly to the [facility] and check in at the registration area.   Wear comfortable clothing and clothing appropriate for easy access to any Portacath or PICC line.   We strive to give you quality time with your provider. You may need to reschedule your appointment if you arrive late (15 or more minutes).  Arriving late affects you and other patients whose appointments are after yours.  Also, if you miss three or more appointments without notifying the office, you may be dismissed from the clinic at the provider's discretion.      For prescription refill requests, have your pharmacy contact our office and allow 72 hours for refills to be completed.    Today you received the following chemotherapy and/or immunotherapy agents: Trastuzumab (Ogivri), Taxol (Paclitaxel)     To help prevent nausea and vomiting after your treatment, we encourage you to take your nausea medication as directed.  BELOW ARE SYMPTOMS THAT SHOULD BE REPORTED IMMEDIATELY: *FEVER GREATER THAN 100.4 F (38 C) OR HIGHER *CHILLS OR SWEATING *NAUSEA AND VOMITING THAT IS NOT CONTROLLED WITH YOUR NAUSEA MEDICATION *UNUSUAL SHORTNESS OF BREATH *UNUSUAL BRUISING OR BLEEDING *URINARY PROBLEMS (pain or burning when urinating, or frequent urination) *BOWEL PROBLEMS (unusual diarrhea, constipation, pain near the anus) TENDERNESS IN MOUTH AND THROAT WITH OR WITHOUT PRESENCE OF ULCERS (sore throat, sores in mouth, or a toothache) UNUSUAL RASH, SWELLING OR PAIN  UNUSUAL VAGINAL DISCHARGE OR ITCHING   Items with * indicate a potential emergency and should be followed up as soon as possible or go to the Emergency Department if any problems should occur.  Please show the CHEMOTHERAPY ALERT CARD or  IMMUNOTHERAPY ALERT CARD at check-in to the Emergency Department and triage nurse.  Should you have questions after your visit or need to cancel or reschedule your appointment, please contact [facility]  Dept: [phone]  and follow the prompts.  Office hours are 8:00 a.m. to 4:30 p.m. Monday - Friday. Please note that voicemails left after 4:00 p.m. may not be returned until the following business day.  We are closed weekends and major holidays. You have access to a nurse at all times for urgent questions. Please call the main number to the clinic Dept: [phone] and follow the prompts.   For any non-urgent questions, you may also contact your provider using MyChart. We now offer e-Visits for anyone 51 and older to request care online for non-urgent symptoms. For details visit [email or web address].   Also download the MyChart app! Go to the app store, search "MyChart", open the app, select [facility], and log in with your MyChart username and password.  Masks are optional in the cancer centers. If you would like for your care team to wear a mask while they are taking care of you, please let them know. You may have one support person who is at least 66 years old accompany you for your appointments. Trastuzumab Injection What is this medication? TRASTUZUMAB (tras TOO zoo mab) treats breast cancer and stomach cancer. It works by blocking a protein that causes cancer cells to grow and multiply. This helps to slow or stop the spread of cancer cells. This medicine may be used for other purposes; ask your health care provider or pharmacist if you have  questions. COMMON BRAND NAME(S): Herceptin, Janae Bridgeman, Ontruzant, Trazimera What should I tell my care team before I take this medication? They need to know if you have any of these conditions: Heart failure Lung disease An unusual or allergic reaction to trastuzumab, other medications, foods, dyes,  or preservatives Pregnant or trying to get pregnant Breast-feeding How should I use this medication? This medication is injected into a vein. It is given by your care team in a hospital or clinic setting. Talk to your care team about the use of this medication in children. It is not approved for use in children. Overdosage: If you think you have taken too much of this medicine contact a poison control center or emergency room at once. NOTE: This medicine is only for you. Do not share this medicine with others. What if I miss a dose? Keep appointments for follow-up doses. It is important not to miss your dose. Call your care team if you are unable to keep an appointment. What may interact with this medication? Certain types of chemotherapy, such as daunorubicin, doxorubicin, epirubicin, idarubicin This list may not describe all possible interactions. Give your health care provider a list of all the medicines, herbs, non-prescription drugs, or dietary supplements you use. Also tell them if you smoke, drink alcohol, or use illegal drugs. Some items may interact with your medicine. What should I watch for while using this medication? Your condition will be monitored carefully while you are receiving this medication. This medication may make you feel generally unwell. This is not uncommon, as chemotherapy affects healthy cells as well as cancer cells. Report any side effects. Continue your course of treatment even though you feel ill unless your care team tells you to stop. This medication may increase your risk of getting an infection. Call your care team for advice if you get a fever, chills, sore throat, or other symptoms of a cold or flu. Do not treat yourself. Try to avoid being around people who are sick. Avoid taking medications that contain aspirin, acetaminophen, ibuprofen, naproxen, or ketoprofen unless instructed by your care team. These medications can hide a fever. Talk to your care team if  you may be pregnant. Serious birth defects can occur if you take this medication during pregnancy and for 7 months after the last dose. You will need a negative pregnancy test before starting this medication. Contraception is recommended while taking this medication and for 7 months after the last dose. Your care team can help you find the option that works for you. Do not breastfeed while taking this medication and for 7 months after stopping treatment. What side effects may I notice from receiving this medication? Side effects that you should report to your care team as soon as possible: Allergic reactions or angioedema--skin rash, itching or hives, swelling of the face, eyes, lips, tongue, arms, or legs, trouble swallowing or breathing Dry cough, shortness of breath or trouble breathing Heart failure--shortness of breath, swelling of the ankles, feet, or hands, sudden weight gain, unusual weakness or fatigue Infection--fever, chills, cough, or sore throat Infusion reactions--chest pain, shortness of breath or trouble breathing, feeling faint or lightheaded Side effects that usually do not require medical attention (report to your care team if they continue or are bothersome): Diarrhea Dizziness Headache Nausea Trouble sleeping Vomiting This list may not describe all possible side effects. Call your doctor for medical advice about side effects. You may report side effects to FDA at 1-800-FDA-1088. Where should I keep  my medication? This medication is given in a hospital or clinic. It will not be stored at home. NOTE: This sheet is a summary. It may not cover all possible information. If you have questions about this medicine, talk to your doctor, pharmacist, or health care provider.  2023 Elsevier/Gold Standard (2021-07-20 00:00:00) Paclitaxel Injection What is this medication? PACLITAXEL (PAK li TAX el) treats some types of cancer. It works by slowing down the growth of cancer cells. This  medicine may be used for other purposes; ask your health care provider or pharmacist if you have questions. COMMON BRAND NAME(S): Onxol, Taxol What should I tell my care team before I take this medication? They need to know if you have any of these conditions: Heart disease Liver disease Low white blood cell levels An unusual or allergic reaction to paclitaxel, other medications, foods, dyes, or preservatives If you or your partner are pregnant or trying to get pregnant Breast-feeding How should I use this medication? This medication is injected into a vein. It is given by your care team in a hospital or clinic setting. Talk to your care team about the use of this medication in children. While it may be given to children for selected conditions, precautions do apply. Overdosage: If you think you have taken too much of this medicine contact a poison control center or emergency room at once. NOTE: This medicine is only for you. Do not share this medicine with others. What if I miss a dose? Keep appointments for follow-up doses. It is important not to miss your dose. Call your care team if you are unable to keep an appointment. What may interact with this medication? Do not take this medication with any of the following: Live virus vaccines Other medications may affect the way this medication works. Talk with your care team about all of the medications you take. They may suggest changes to your treatment plan to lower the risk of side effects and to make sure your medications work as intended. This list may not describe all possible interactions. Give your health care provider a list of all the medicines, herbs, non-prescription drugs, or dietary supplements you use. Also tell them if you smoke, drink alcohol, or use illegal drugs. Some items may interact with your medicine. What should I watch for while using this medication? Your condition will be monitored carefully while you are receiving  this medication. You may need blood work while taking this medication. This medication may make you feel generally unwell. This is not uncommon as chemotherapy can affect healthy cells as well as cancer cells. Report any side effects. Continue your course of treatment even though you feel ill unless your care team tells you to stop. This medication can cause serious allergic reactions. To reduce the risk, your care team may give you other medications to take before receiving this one. Be sure to follow the directions from your care team. This medication may increase your risk of getting an infection. Call your care team for advice if you get a fever, chills, sore throat, or other symptoms of a cold or flu. Do not treat yourself. Try to avoid being around people who are sick. This medication may increase your risk to bruise or bleed. Call your care team if you notice any unusual bleeding. Be careful brushing or flossing your teeth or using a toothpick because you may get an infection or bleed more easily. If you have any dental work done, tell your dentist you are  receiving this medication. Talk to your care team if you may be pregnant. Serious birth defects can occur if you take this medication during pregnancy. Talk to your care team before breastfeeding. Changes to your treatment plan may be needed. What side effects may I notice from receiving this medication? Side effects that you should report to your care team as soon as possible: Allergic reactions--skin rash, itching, hives, swelling of the face, lips, tongue, or throat Heart rhythm changes--fast or irregular heartbeat, dizziness, feeling faint or lightheaded, chest pain, trouble breathing Increase in blood pressure Infection--fever, chills, cough, sore throat, wounds that don't heal, pain or trouble when passing urine, general feeling of discomfort or being unwell Low blood pressure--dizziness, feeling faint or lightheaded, blurry vision Low  red blood cell level--unusual weakness or fatigue, dizziness, headache, trouble breathing Painful swelling, warmth, or redness of the skin, blisters or sores at the infusion site Pain, tingling, or numbness in the hands or feet Slow heartbeat--dizziness, feeling faint or lightheaded, confusion, trouble breathing, unusual weakness or fatigue Unusual bruising or bleeding Side effects that usually do not require medical attention (report to your care team if they continue or are bothersome): Diarrhea Hair loss Joint pain Loss of appetite Muscle pain Nausea Vomiting This list may not describe all possible side effects. Call your doctor for medical advice about side effects. You may report side effects to FDA at 1-800-FDA-1088. Where should I keep my medication? This medication is given in a hospital or clinic. It will not be stored at home. NOTE: This sheet is a summary. It may not cover all possible information. If you have questions about this medicine, talk to your doctor, pharmacist, or health care provider.  2023 Elsevier/Gold Standard (2021-07-22 00:00:00)

## 2021-11-25 NOTE — Assessment & Plan Note (Signed)
09/07/2021:Screening mammogram detected left breast mass by ultrasound 2 o'clock position measured 1.4 cm axilla was negative. 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 adjuvant chemotherapy with Taxol Herceptin weekly x12 followed by Herceptin maintenance every 3 weeks for 1 year 2.Adjuvant radiation therapy 3.Adjuvant antiestrogen therapy ----------------------------------------------------------------------------------------------------------------------------- Current treatment: Cycle 1 day 1 Taxol Herceptin Labs reviewed, chemo education completed, chemo consent obtained, adverse effects were discussed again and antiemetics were also discussed.  Return to clinic in 1 week for toxicity check

## 2021-11-25 NOTE — Progress Notes (Signed)
The pharmacy team has substituted IV diphenhydramine for IV cetirizine as a premedication. Patient will be monitored for hypersensitivity reaction and adverse reactions to IV cetirizine. Thanks.   Kennith Center, Pharm.D., CPP 11/25/2021'@1'$ :13 PM

## 2021-11-26 ENCOUNTER — Encounter: Payer: Self-pay | Admitting: *Deleted

## 2021-11-26 ENCOUNTER — Other Ambulatory Visit: Payer: Self-pay | Admitting: *Deleted

## 2021-11-26 DIAGNOSIS — Z170 Estrogen receptor positive status [ER+]: Secondary | ICD-10-CM

## 2021-11-26 NOTE — Progress Notes (Signed)
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  Per MD pt needing to be referred to lymphedema clinic.  Referral placed in epic.

## 2021-12-01 MED FILL — Dexamethasone Sodium Phosphate Inj 100 MG/10ML: INTRAMUSCULAR | Qty: 1 | Status: AC

## 2021-12-01 NOTE — Therapy (Deleted)
OUTPATIENT PHYSICAL THERAPY BREAST CANCER POST OP FOLLOW UP   Patient Name: Karen Vazquez MRN: [id number] DOB:April 02, 1955, 66 y.o., female Today's Date: 12/01/2021    Past Medical History:  Diagnosis Date   Anemia    Anxiety    Arthritis    Back pain    Blood transfusion 2011   at [facility]   Bulging lumbar disc    Chest pain    Diabetes mellitus without complication (HCC)    Dry mouth    Easy bruising    Excessive thirst    Fatigue    Frequent urination    GERD (gastroesophageal reflux disease)    Headache    Heat intolerance    Hypertension    Hypothyroidism    Joint pain    Leg pain    Muscle stiffness    Nervousness    Palpitations    Pre-diabetes    Restless leg syndrome    Shortness of breath on exertion    Sleep apnea    does not use c-pap machine   Stomach ulcer    Stress    Swelling of both lower extremities    Trouble in sleeping    Vitamin D deficiency    Weakness    Past Surgical History:  Procedure Laterality Date   APPENDECTOMY     BIOPSY  05/16/2019   Procedure: BIOPSY;  Surgeon: Rogene Houston, MD;  [facility];  Service: Endoscopy;;  duodenum antral   BLADDER SUSPENSION  12/29/2010   Procedure: TRANSVAGINAL TAPE (TVT) PROCEDURE;  Surgeon: Daria Pastures;  Location: [facility];  Service: Gynecology;  Laterality: N/A;   BREAST LUMPECTOMY WITH RADIOACTIVE SEED AND SENTINEL LYMPH NODE BIOPSY Left 10/26/2021   Procedure: LEFT BREAST SEED LUMPECTOMY WITH LEFT SENTINEL LYMPH NODE MAPPING;  Surgeon: Erroll Luna, MD;  Location: [facility];  Service: General;  Laterality: Left;   BREAST SURGERY     left breast lumpectomy 1977   CATARACT EXTRACTION     COLONOSCOPY N/A 03/03/2016   Procedure: COLONOSCOPY;  Surgeon: Rogene Houston, MD;  [facility];  Service: Endoscopy;  Laterality: N/A;  2:00 - moved to 12/14 @ 12:55 - Ann notified pt   CYSTOCELE REPAIR  12/29/2010   Procedure: ANTERIOR REPAIR (CYSTOCELE);   Surgeon: Daria Pastures;  Location: [facility];  Service: Gynecology;  Laterality: N/A;   CYSTOSCOPY  12/29/2010   Procedure: CYSTOSCOPY;  Surgeon: Daria Pastures;  Location: [facility];  Service: Gynecology;  Laterality: N/A;   ESOPHAGOGASTRODUODENOSCOPY N/A 05/16/2019   Procedure: ESOPHAGOGASTRODUODENOSCOPY (EGD);  Surgeon: Rogene Houston, MD;  [facility];  Service: Endoscopy;  Laterality: N/A;   JOINT REPLACEMENT  2011   rt knee   PORTACATH PLACEMENT N/A 10/26/2021   Procedure: PORT PLACEMENT WITH ULTRASOUND GUIDANCE;  Surgeon: Erroll Luna, MD;  Location: [facility];  Service: General;  Laterality: N/A;   REPLACEMENT TOTAL KNEE Right 2011   TONSILLECTOMY     TOTAL KNEE ARTHROPLASTY Left 11/17/2014   Procedure: LEFT TOTAL KNEE ARTHROPLASTY;  Surgeon: Gaynelle Arabian, MD;  Location: [facility];  Service: Orthopedics;  Laterality: Left;   TUBAL LIGATION     VAGINAL HYSTERECTOMY  12/29/2010   Procedure: HYSTERECTOMY VAGINAL;  Surgeon: Daria Pastures;  Location: [facility];  Service: Gynecology;  Laterality: N/A;   Patient Active Problem List   Diagnosis Date Noted   Port-A-Cath in place 11/25/2021   Genetic testing 09/20/2021   Family history  of prostate cancer in father 09/15/2021   Family history of breast cancer 09/15/2021   Malignant neoplasm of upper-outer quadrant of left breast in female, estrogen receptor positive ([place]) 09/09/2021   Menopausal symptom 08/10/2021   UTI (urinary tract infection) 08/10/2021   Eating disorder 04/01/2021   Lesion of vulva 12/22/2020   History of iron deficiency anemia 10/27/2020   Cataract, nuclear sclerotic, both eyes 01/10/2020   Duodenal ulcer 06/24/2019   Fracture of distal end of radius 01/22/2018   History of total knee replacement, right 06/08/2017   History of colonic polyps 04/26/2016   OA (osteoarthritis) of knee 11/17/2014   Essential hypertension 05/28/2014   Obstructive sleep apnea 05/28/2014   Anemia, iron  deficiency 12/16/2013   Prediabetes 05/01/2013   GERD (gastroesophageal reflux disease) 02/06/2013   Hypothyroidism 12/12/2012   Insomnia 12/12/2012   Hyperlipidemia 12/12/2012   Osteoarthritis 12/12/2012   Restless legs syndrome 12/12/2012   S/P LASIK surgery of both eyes 03/21/1998    REFERRING PROVIDER: Dr. Erroll Luna  REFERRING DIAG: Left breast cancer  THERAPY DIAG:  Malignant neoplasm of upper-outer quadrant of left breast in female, estrogen receptor positive ([place])  Abnormal posture  Aftercare following surgery for neoplasm  Rationale for Evaluation and Treatment Rehabilitation  ONSET DATE: 10/26/2021  SUBJECTIVE:                                                                                                                                                                                           SUBJECTIVE STATEMENT: Patient reports she underwent a left lumpectomy and sentinel node biopsy (3 negative nodes) on 10/26/2021. She began chemotherapy on 11/25/2021 as it is HER2+.  PERTINENT HISTORY:  Patient was diagnosed on 08/17/2021 with left grade 2 invasive ductal carcinoma breast cancer. It is ER positive, PR negative, and HER2 positive with a Ki67 of 55%. She underwent a left lumpectomy and sentinel node biopsy (3 negative nodes) on 10/26/2021. She had a benign left breast lump removed in 1977, has anemia, hypertension, and has had both knee replaced in 2011 and 2016 (unknown when right versus left were done).  PATIENT GOALS:  Reassess how my recovery is going related to arm function, pain, and swelling.  PAIN:  Are you having pain? {OPRCPAIN:27236}  PRECAUTIONS: Recent Surgery, left UE Lymphedema risk; actively undergoing chemotherapy  ACTIVITY LEVEL / LEISURE: ***   OBJECTIVE:   PATIENT SURVEYS:  QUICK DASH: ***  OBSERVATIONS:  ***  POSTURE:  ***  LYMPHEDEMA ASSESSMENT:   UPPER EXTREMITY AROM/PROM:   A/PROM RIGHT   eval    Shoulder extension 53   Shoulder flexion 156  Shoulder abduction  167  Shoulder internal rotation 76  Shoulder external rotation 74                          (Blank rows = not tested)   A/PROM LEFT   eval LEFT 12/02/2021  Shoulder extension 46   Shoulder flexion 146   Shoulder abduction 151   Shoulder internal rotation 63   Shoulder external rotation 68                           (Blank rows = not tested)     CERVICAL AROM: All within normal limits   UPPER EXTREMITY STRENGTH: WFL     LYMPHEDEMA ASSESSMENTS:    LANDMARK RIGHT   eval RIGHT 12/02/2021  10 cm proximal to olecranon process 36.8   Olecranon process 28.8   10 cm proximal to ulnar styloid process 24.2   Just proximal to ulnar styloid process 16.1   Across hand at thumb web space 19.4   At base of 2nd digit 6.1   (Blank rows = not tested)   LANDMARK LEFT   eval LEFT 12/02/2021  10 cm proximal to olecranon process 37.4   Olecranon process 29.5   10 cm proximal to ulnar styloid process 25.2   Just proximal to ulnar styloid process 16.9   Across hand at thumb web space 19   At base of 2nd digit 5.9   (Blank rows = not tested)        Surgery type/Date: Left lumpectomy and sentinel node biopsy 10/26/2021 Number of lymph nodes removed: 3 Current/past treatment (chemo, radiation, hormone therapy): Currently undergoing chemotherapy Other symptoms:  Heaviness/tightness {yes/no:20286} Pain {yes/no:20286} Pitting edema {yes/no:20286} Infections {yes/no:20286} Decreased scar mobility {yes/no:20286} Stemmer sign {yes/no:20286}   PATIENT EDUCATION:  Education details: *** Person educated: {Person educated:25204} Education method: {Education Method:25205} Education comprehension: {Education Comprehension:25206}   HOME EXERCISE PROGRAM:  Reviewed previously given post op HEP. ***  ASSESSMENT:  CLINICAL IMPRESSION: ***  Pt will benefit from skilled therapeutic intervention to improve on the following deficits: Decreased  knowledge of precautions, impaired UE functional use, pain, decreased ROM, postural dysfunction.   PT treatment/interventions: ADL/Self care home management, {rehab planned interventions:25118::"Therapeutic exercises","Therapeutic activity","Neuromuscular re-education","Balance training","Gait training","Patient/Family education","Self Care","Joint mobilization"}     GOALS: Goals reviewed with patient? Yes  LONG TERM GOALS:  (STG=LTG)  GOALS Name Target Date  Goal status  1 Pt will demonstrate she has regained full shoulder ROM and function post operatively compared to baselines.  Baseline: 11/10/2021 {GOALSTATUS:25110}  2  {follow up:25551} {GOALSTATUS:25110}  3  {follow up:25551} {GOALSTATUS:25110}  4  {follow up:25551} {GOALSTATUS:25110}     PLAN: PT FREQUENCY/DURATION: ***  PLAN FOR NEXT SESSION: ***   [facility]  [address]  [place] [address]  [phone]  After Breast Cancer Class It is recommended you attend the ABC class to be educated on lymphedema risk reduction. This class is free of charge and lasts for 1 hour. It is a 1-time class. You will need to download the Webex app either on your phone or computer. We will send you a link the night before or the morning of the class. You should be able to click on that link to join the class. This is not a confidential class. You don't have to turn your camera on, but other participants may be able to see your email address.  Scar massage You can  begin gentle scar massage to you incision sites. Gently place one hand on the incision and move the skin (without sliding on the skin) in various directions. Do this for a few minutes and then you can gently massage either coconut oil or vitamin E cream into the scars.  Compression garment You should continue wearing your compression bra until you feel like you no longer have swelling.  Home exercise Program Continue doing the exercises you  were given until you feel like you can do them without feeling any tightness at the end.   Walking Program Studies show that 30 minutes of walking per day (fast enough to elevate your heart rate) can significantly reduce the risk of a cancer recurrence. If you can't walk due to other medical reasons, we encourage you to find another activity you could do (like a stationary bike or water exercise).  Posture After breast cancer surgery, people frequently sit with rounded shoulders posture because it puts their incisions on slack and feels better. If you sit like this and scar tissue forms in that position, you can become very tight and have pain sitting or standing with good posture. Try to be aware of your posture and sit and stand up tall to heal properly.  Follow up PT: It is recommended you return every 3 months for the first 3 years following surgery to be assessed on the SOZO machine for an L-Dex score. This helps prevent clinically significant lymphedema in 95% of patients. These follow up screens are 10 minute appointments that you are not billed for.  Khairi Garman,MARTI COOPER, PT 12/01/2021, 4:22 PM

## 2021-12-02 ENCOUNTER — Inpatient Hospital Stay: Payer: Medicare Other

## 2021-12-02 ENCOUNTER — Encounter: Payer: Self-pay | Admitting: Rehabilitation

## 2021-12-02 ENCOUNTER — Ambulatory Visit: Payer: Medicare Other | Attending: Surgery | Admitting: Rehabilitation

## 2021-12-02 VITALS — BP 159/89 | HR 81 | Temp 99.0°F | Resp 16 | Wt 226.5 lb

## 2021-12-02 DIAGNOSIS — C50412 Malignant neoplasm of upper-outer quadrant of left female breast: Secondary | ICD-10-CM | POA: Insufficient documentation

## 2021-12-02 DIAGNOSIS — Z170 Estrogen receptor positive status [ER+]: Secondary | ICD-10-CM

## 2021-12-02 DIAGNOSIS — R293 Abnormal posture: Secondary | ICD-10-CM | POA: Insufficient documentation

## 2021-12-02 DIAGNOSIS — Z5111 Encounter for antineoplastic chemotherapy: Secondary | ICD-10-CM | POA: Diagnosis not present

## 2021-12-02 DIAGNOSIS — Z483 Aftercare following surgery for neoplasm: Secondary | ICD-10-CM | POA: Insufficient documentation

## 2021-12-02 DIAGNOSIS — Z95828 Presence of other vascular implants and grafts: Secondary | ICD-10-CM

## 2021-12-02 LAB — CBC WITH DIFFERENTIAL (CANCER CENTER ONLY)
Abs Immature Granulocytes: 0.04 10*3/uL (ref 0.00–0.07)
Basophils Absolute: 0 10*3/uL (ref 0.0–0.1)
Basophils Relative: 0 %
Eosinophils Absolute: 0 10*3/uL (ref 0.0–0.5)
Eosinophils Relative: 1 %
HCT: 35.4 % — ABNORMAL LOW (ref 36.0–46.0)
Hemoglobin: 11.5 g/dL — ABNORMAL LOW (ref 12.0–15.0)
Immature Granulocytes: 1 %
Lymphocytes Relative: 18 %
Lymphs Abs: 1.3 10*3/uL (ref 0.7–4.0)
MCH: 26.8 pg (ref 26.0–34.0)
MCHC: 32.5 g/dL (ref 30.0–36.0)
MCV: 82.5 fL (ref 80.0–100.0)
Monocytes Absolute: 0.4 10*3/uL (ref 0.1–1.0)
Monocytes Relative: 5 %
Neutro Abs: 5.8 10*3/uL (ref 1.7–7.7)
Neutrophils Relative %: 75 %
Platelet Count: 142 10*3/uL — ABNORMAL LOW (ref 150–400)
RBC: 4.29 MIL/uL (ref 3.87–5.11)
RDW: 14.4 % (ref 11.5–15.5)
WBC Count: 7.6 10*3/uL (ref 4.0–10.5)
nRBC: 0 % (ref 0.0–0.2)

## 2021-12-02 LAB — CMP (CANCER CENTER ONLY)
ALT: 15 U/L (ref 0–44)
AST: 14 U/L — ABNORMAL LOW (ref 15–41)
Albumin: 3.8 g/dL (ref 3.5–5.0)
Alkaline Phosphatase: 80 U/L (ref 38–126)
Anion gap: 3 — ABNORMAL LOW (ref 5–15)
BUN: 11 mg/dL (ref 8–23)
CO2: 29 mmol/L (ref 22–32)
Calcium: 9.2 mg/dL (ref 8.9–10.3)
Chloride: 104 mmol/L (ref 98–111)
Creatinine: 0.82 mg/dL (ref 0.44–1.00)
GFR, Estimated: >60 mL/min (ref >60)
Glucose, Bld: 155 mg/dL — ABNORMAL HIGH (ref 70–99)
Potassium: 3.7 mmol/L (ref 3.5–5.1)
Sodium: 136 mmol/L (ref 135–145)
Total Bilirubin: 0.7 mg/dL (ref 0.3–1.2)
Total Protein: 6.2 g/dL — ABNORMAL LOW (ref 6.5–8.1)

## 2021-12-02 MED ORDER — SODIUM CHLORIDE 0.9 % IV SOLN
80.0000 mg/m2 | Freq: Once | INTRAVENOUS | Status: AC
  Administered 2021-12-02: 174 mg via INTRAVENOUS
  Filled 2021-12-02: qty 29

## 2021-12-02 MED ORDER — SODIUM CHLORIDE 0.9% FLUSH
10.0000 mL | INTRAVENOUS | Status: DC | PRN
  Administered 2021-12-02: 10 mL

## 2021-12-02 MED ORDER — SODIUM CHLORIDE 0.9% FLUSH
10.0000 mL | Freq: Once | INTRAVENOUS | Status: AC
  Administered 2021-12-02: 10 mL

## 2021-12-02 MED ORDER — TRASTUZUMAB-DKST CHEMO 150 MG IV SOLR
2.0000 mg/kg | Freq: Once | INTRAVENOUS | Status: AC
  Administered 2021-12-02: 210 mg via INTRAVENOUS
  Filled 2021-12-02: qty 10

## 2021-12-02 MED ORDER — CETIRIZINE HCL 10 MG/ML IV SOLN
10.0000 mg | Freq: Once | INTRAVENOUS | Status: AC
  Administered 2021-12-02: 10 mg via INTRAVENOUS
  Filled 2021-12-02: qty 1

## 2021-12-02 MED ORDER — HEPARIN SOD (PORK) LOCK FLUSH 100 UNIT/ML IV SOLN
500.0000 [IU] | Freq: Once | INTRAVENOUS | Status: AC | PRN
  Administered 2021-12-02: 500 [IU]

## 2021-12-02 MED ORDER — FAMOTIDINE IN NACL 20-0.9 MG/50ML-% IV SOLN
20.0000 mg | Freq: Once | INTRAVENOUS | Status: AC
  Administered 2021-12-02: 20 mg via INTRAVENOUS
  Filled 2021-12-02: qty 50

## 2021-12-02 MED ORDER — ACETAMINOPHEN 325 MG PO TABS
650.0000 mg | ORAL_TABLET | Freq: Once | ORAL | Status: AC
  Administered 2021-12-02: 650 mg via ORAL
  Filled 2021-12-02: qty 2

## 2021-12-02 MED ORDER — SODIUM CHLORIDE 0.9 % IV SOLN
10.0000 mg | Freq: Once | INTRAVENOUS | Status: AC
  Administered 2021-12-02: 10 mg via INTRAVENOUS
  Filled 2021-12-02: qty 10

## 2021-12-02 MED ORDER — SODIUM CHLORIDE 0.9 % IV SOLN: Freq: Once | INTRAVENOUS | Status: AC

## 2021-12-02 NOTE — Patient Instructions (Signed)
[facility]  Discharge Instructions: Thank you for choosing [facility] to provide your oncology and hematology care.   If you have a lab appointment with the [facility], please go directly to the [facility] and check in at the registration area.   Wear comfortable clothing and clothing appropriate for easy access to any Portacath or PICC line.   We strive to give you quality time with your provider. You may need to reschedule your appointment if you arrive late (15 or more minutes).  Arriving late affects you and other patients whose appointments are after yours.  Also, if you miss three or more appointments without notifying the office, you may be dismissed from the clinic at the provider's discretion.      For prescription refill requests, have your pharmacy contact our office and allow 72 hours for refills to be completed.    Today you received the following chemotherapy and/or immunotherapy agents; Trastuzumab (Ogivri) & Paclitaxel (Taxol)      To help prevent nausea and vomiting after your treatment, we encourage you to take your nausea medication as directed.  BELOW ARE SYMPTOMS THAT SHOULD BE REPORTED IMMEDIATELY: *FEVER GREATER THAN 100.4 F (38 C) OR HIGHER *CHILLS OR SWEATING *NAUSEA AND VOMITING THAT IS NOT CONTROLLED WITH YOUR NAUSEA MEDICATION *UNUSUAL SHORTNESS OF BREATH *UNUSUAL BRUISING OR BLEEDING *URINARY PROBLEMS (pain or burning when urinating, or frequent urination) *BOWEL PROBLEMS (unusual diarrhea, constipation, pain near the anus) TENDERNESS IN MOUTH AND THROAT WITH OR WITHOUT PRESENCE OF ULCERS (sore throat, sores in mouth, or a toothache) UNUSUAL RASH, SWELLING OR PAIN  UNUSUAL VAGINAL DISCHARGE OR ITCHING   Items with * indicate a potential emergency and should be followed up as soon as possible or go to the Emergency Department if any problems should occur.  Please show the CHEMOTHERAPY ALERT CARD or  IMMUNOTHERAPY ALERT CARD at check-in to the Emergency Department and triage nurse.  Should you have questions after your visit or need to cancel or reschedule your appointment, please contact [facility]  Dept: [phone]  and follow the prompts.  Office hours are 8:00 a.m. to 4:30 p.m. Monday - Friday. Please note that voicemails left after 4:00 p.m. may not be returned until the following business day.  We are closed weekends and major holidays. You have access to a nurse at all times for urgent questions. Please call the main number to the clinic Dept: [phone] and follow the prompts.   For any non-urgent questions, you may also contact your provider using MyChart. We now offer e-Visits for anyone 33 and older to request care online for non-urgent symptoms. For details visit [email or web address].   Also download the MyChart app! Go to the app store, search "MyChart", open the app, select [facility], and log in with your MyChart username and password.  Masks are optional in the cancer centers. If you would like for your care team to wear a mask while they are taking care of you, please let them know. You may have one support Amere Iott who is at least 66 years old accompany you for your appointments.

## 2021-12-02 NOTE — Therapy (Signed)
OUTPATIENT PHYSICAL THERAPY BREAST CANCER POST OP FOLLOW UP   Patient Name: Karen Vazquez MRN: [id number] DOB:10/10/1955, 66 y.o., female Today's Date: 12/02/2021   PT End of Session - 12/02/21 1100     Visit Number 2    Number of Visits 2    Date for PT Re-Evaluation 11/10/21    PT Start Time 1100    PT Stop Time 1125    PT Time Calculation (min) 25 min    Activity Tolerance Patient tolerated treatment well    Behavior During Therapy [facility] for tasks assessed/performed             Past Medical History:  Diagnosis Date   Anemia    Anxiety    Arthritis    Back pain    Blood transfusion 2011   at [facility]   Bulging lumbar disc    Chest pain    Diabetes mellitus without complication (HCC)    Dry mouth    Easy bruising    Excessive thirst    Fatigue    Frequent urination    GERD (gastroesophageal reflux disease)    Headache    Heat intolerance    Hypertension    Hypothyroidism    Joint pain    Leg pain    Muscle stiffness    Nervousness    Palpitations    Pre-diabetes    Restless leg syndrome    Shortness of breath on exertion    Sleep apnea    does not use c-pap machine   Stomach ulcer    Stress    Swelling of both lower extremities    Trouble in sleeping    Vitamin D deficiency    Weakness    Past Surgical History:  Procedure Laterality Date   APPENDECTOMY     BIOPSY  05/16/2019   Procedure: BIOPSY;  Surgeon: Rogene Houston, MD;  [facility]: [facility];  Service: Endoscopy;;  duodenum antral   BLADDER SUSPENSION  12/29/2010   Procedure: TRANSVAGINAL TAPE (TVT) PROCEDURE;  Surgeon: Daria Pastures;  Location: [facility];  Service: Gynecology;  Laterality: N/A;   BREAST LUMPECTOMY WITH RADIOACTIVE SEED AND SENTINEL LYMPH NODE BIOPSY Left 10/26/2021   Procedure: LEFT BREAST SEED LUMPECTOMY WITH LEFT SENTINEL LYMPH NODE MAPPING;  Surgeon: Erroll Luna, MD;  Location: [facility];  Service: General;  Laterality: Left;   BREAST SURGERY      left breast lumpectomy 1977   CATARACT EXTRACTION     COLONOSCOPY N/A 03/03/2016   Procedure: COLONOSCOPY;  Surgeon: Rogene Houston, MD;  [facility]: [facility];  Service: Endoscopy;  Laterality: N/A;  2:00 - moved to 12/14 @ 12:55 - Ann notified pt   CYSTOCELE REPAIR  12/29/2010   Procedure: ANTERIOR REPAIR (CYSTOCELE);  Surgeon: Daria Pastures;  Location: [facility];  Service: Gynecology;  Laterality: N/A;   CYSTOSCOPY  12/29/2010   Procedure: CYSTOSCOPY;  Surgeon: Daria Pastures;  Location: [facility];  Service: Gynecology;  Laterality: N/A;   ESOPHAGOGASTRODUODENOSCOPY N/A 05/16/2019   Procedure: ESOPHAGOGASTRODUODENOSCOPY (EGD);  Surgeon: Rogene Houston, MD;  [facility];  Service: Endoscopy;  Laterality: N/A;   JOINT REPLACEMENT  2011   rt knee   PORTACATH PLACEMENT N/A 10/26/2021   Procedure: PORT PLACEMENT WITH ULTRASOUND GUIDANCE;  Surgeon: Erroll Luna, MD;  Location: [facility];  Service: General;  Laterality: N/A;   REPLACEMENT TOTAL KNEE Right 2011   TONSILLECTOMY     TOTAL KNEE ARTHROPLASTY  Left 11/17/2014   Procedure: LEFT TOTAL KNEE ARTHROPLASTY;  Surgeon: Gaynelle Arabian, MD;  Location: [facility];  Service: Orthopedics;  Laterality: Left;   TUBAL LIGATION     VAGINAL HYSTERECTOMY  12/29/2010   Procedure: HYSTERECTOMY VAGINAL;  Surgeon: Daria Pastures;  Location: [facility];  Service: Gynecology;  Laterality: N/A;   Patient Active Problem List   Diagnosis Date Noted   Port-A-Cath in place 11/25/2021   Genetic testing 09/20/2021   Family history of prostate cancer in father 09/15/2021   Family history of breast cancer 09/15/2021   Malignant neoplasm of upper-outer quadrant of left breast in female, estrogen receptor positive ([place]) 09/09/2021   Menopausal symptom 08/10/2021   UTI (urinary tract infection) 08/10/2021   Eating disorder 04/01/2021   Lesion of vulva 12/22/2020   History of iron deficiency anemia 10/27/2020   Cataract, nuclear  sclerotic, both eyes 01/10/2020   Duodenal ulcer 06/24/2019   Fracture of distal end of radius 01/22/2018   History of total knee replacement, right 06/08/2017   History of colonic polyps 04/26/2016   OA (osteoarthritis) of knee 11/17/2014   Essential hypertension 05/28/2014   Obstructive sleep apnea 05/28/2014   Anemia, iron deficiency 12/16/2013   Prediabetes 05/01/2013   GERD (gastroesophageal reflux disease) 02/06/2013   Hypothyroidism 12/12/2012   Insomnia 12/12/2012   Hyperlipidemia 12/12/2012   Osteoarthritis 12/12/2012   Restless legs syndrome 12/12/2012   S/P LASIK surgery of both eyes 03/21/1998    REFERRING PROVIDER: Dr. Erroll Luna  REFERRING DIAG: Left breast cancer  THERAPY DIAG:  Malignant neoplasm of upper-outer quadrant of left breast in female, estrogen receptor positive ([place])  Abnormal posture  Aftercare following surgery for neoplasm  Rationale for Evaluation and Treatment Rehabilitation  ONSET DATE: 10/26/2021  SUBJECTIVE:                                                                                                                                                                                           SUBJECTIVE STATEMENT: Patient reports she underwent a left lumpectomy and sentinel node biopsy (3 negative nodes) on 10/26/2021. She began chemotherapy on 11/25/2021 as it is HER2+. I do have a seroma that I will see him again tomorrow to see if we need to aspirate it.  This bra is just rubbing me the wrong way.    PERTINENT HISTORY:  Patient was diagnosed on 08/17/2021 with left grade 2 invasive ductal carcinoma breast cancer. It is ER positive, PR negative, and HER2 positive with a Ki67 of 55%. She underwent a left lumpectomy and sentinel node biopsy (3 negative nodes) on 10/26/2021. She had a benign left breast  lump removed in 1977, has anemia, hypertension, and has had both knee replaced in 2011 and 2016 (unknown when right versus left were  done).  PATIENT GOALS:  Reassess how my recovery is going related to arm function, pain, and swelling.  PAIN:  Are you having pain? Just in the axilla at the incision where it rubs  PRECAUTIONS: Recent Surgery, left UE Lymphedema risk; actively undergoing chemotherapy  ACTIVITY LEVEL / LEISURE: has returned to full activity    OBJECTIVE:   PATIENT SURVEYS:  QUICK DASH: 0%  OBSERVATIONS: Full axilla - has seroma and does have some redness in the breast and axilla which my be a sign of infection.  Pt will be seeing Dr. Brantley Stage tomorrow already for possible aspiration.   POSTURE:  Forward shoulders  LYMPHEDEMA ASSESSMENT:   UPPER EXTREMITY AROM/PROM:   A/PROM RIGHT   eval    Shoulder extension 53  Shoulder flexion 156  Shoulder abduction 167  Shoulder internal rotation 76  Shoulder external rotation 74                          (Blank rows = not tested)   A/PROM LEFT   eval LEFT 12/02/2021  Shoulder extension 46   Shoulder flexion 146 152  Shoulder abduction 151 161  Shoulder internal rotation 63   Shoulder external rotation 68 80                          (Blank rows = not tested)     CERVICAL AROM: All within normal limits   UPPER EXTREMITY STRENGTH: WFL     LYMPHEDEMA ASSESSMENTS:    LANDMARK RIGHT   eval RIGHT 12/02/2021  10 cm proximal to olecranon process 36.8 37.8  Olecranon process 28.8 29  10  cm proximal to ulnar styloid process 24.2 25  Just proximal to ulnar styloid process 16.1 16.2  Across hand at thumb web space 19.4 19.4  At base of 2nd digit 6.1 6.4  (Blank rows = not tested)   LANDMARK LEFT   eval LEFT 12/02/2021  10 cm proximal to olecranon process 37.4 37.5  Olecranon process 29.5 30  10  cm proximal to ulnar styloid process 25.2 25.3  Just proximal to ulnar styloid process 16.9 16.5  Across hand at thumb web space 19 19  At base of 2nd digit 5.9 6.0  (Blank rows = not tested)   Surgery type/Date: Left lumpectomy and sentinel  node biopsy 10/26/2021 Number of lymph nodes removed: 3 Current/past treatment (chemo, radiation, hormone therapy): Currently undergoing chemotherapy  PATIENT EDUCATION:  Education details: post op per instruction section Person educated: Patient and Child(ren) Education method: Explanation, Demonstration, and Handouts Education comprehension: verbalized understanding   HOME EXERCISE PROGRAM:  Reviewed previously given post op HEP.   ASSESSMENT:  CLINICAL IMPRESSION: Pt is doing very well post-op except for possible infection and seroma in the axilla.  Has returned to PLOF and baseline AROM.  Educated on best care during chemo and radiation and reasons to return to therapy.   Pt will benefit from skilled therapeutic intervention to improve on the following deficits: Decreased knowledge of precautions, impaired UE functional use, pain, decreased ROM, postural dysfunction.   PT treatment/interventions: ADL/Self care home management, Therapeutic exercises, Patient/Family education, and Re-evaluation     GOALS: Goals reviewed with patient? Yes  LONG TERM GOALS:  (STG=LTG)  GOALS Name Target Date  Goal status  1 Pt  will demonstrate she has regained full shoulder ROM and function post operatively compared to baselines.  Baseline: 11/10/2021 MET                    PLAN: PT FREQUENCY/DURATION: 1 visit  PLAN FOR NEXT SESSION: [facility]  [address], [address]  [place] [address]  [phone]  After Breast Cancer Class It is recommended you attend the ABC class to be educated on lymphedema risk reduction. This class is free of charge and lasts for 1 hour. It is a 1-time class. You will need to download the Webex app either on your phone or computer. We will send you a link the night before or the morning of the class. You should be able to click on that link to join the class. This is not a confidential class. You don't have to turn your  camera on, but other participants may be able to see your email address.  Scar massage You can begin gentle scar massage to you incision sites. Gently place one hand on the incision and move the skin (without sliding on the skin) in various directions. Do this for a few minutes and then you can gently massage either coconut oil or vitamin E cream into the scars.  Compression garment You should continue wearing your compression bra until you feel like you no longer have swelling.  Home exercise Program Continue doing the exercises you were given until you feel like you can do them without feeling any tightness at the end.   Walking Program Studies show that 30 minutes of walking per day (fast enough to elevate your heart rate) can significantly reduce the risk of a cancer recurrence. If you can't walk due to other medical reasons, we encourage you to find another activity you could do (like a stationary bike or water exercise).  Posture After breast cancer surgery, people frequently sit with rounded shoulders posture because it puts their incisions on slack and feels better. If you sit like this and scar tissue forms in that position, you can become very tight and have pain sitting or standing with good posture. Try to be aware of your posture and sit and stand up tall to heal properly.  Follow up PT: It is recommended you return every 3 months for the first 3 years following surgery to be assessed on the SOZO machine for an L-Dex score. This helps prevent clinically significant lymphedema in 95% of patients. These follow up screens are 10 minute appointments that you are not billed for.  Stark Bray, PT 12/02/2021, 11:30 AM

## 2021-12-02 NOTE — Patient Instructions (Signed)
[facility]  [address], [address]  [place]  [place] [address]  [phone]  After Breast Cancer Class It is recommended you attend the ABC class to be educated on lymphedema risk reduction. This class is free of charge and lasts for 1 hour. It is a 1-time class. You will need to download the Webex app either on your phone or computer. We will send you a link the night before or the morning of the class. You should be able to click on that link to join the class. This is not a confidential class. You don't have to turn your camera on, but other participants may be able to see your email address.  Scar massage You can begin gentle scar massage to you incision sites. Gently place one hand on the incision and move the skin (without sliding on the skin) in various directions. Do this for a few minutes and then you can gently massage either coconut oil or vitamin E cream into the scars.  Compression garment You should continue wearing your compression bra until you feel like you no longer have swelling.  Home exercise Program Continue doing the exercises you were given until you feel like you can do them without feeling any tightness at the end.   Walking Program Studies show that 30 minutes of walking per day (fast enough to elevate your heart rate) can significantly reduce the risk of a cancer recurrence. If you can't walk due to other medical reasons, we encourage you to find another activity you could do (like a stationary bike or water exercise).  Posture After breast cancer surgery, people frequently sit with rounded shoulders posture because it puts their incisions on slack and feels better. If you sit like this and scar tissue forms in that position, you can become very tight and have pain sitting or standing with good posture. Try to be aware of your posture and sit and stand up tall to heal properly.  Follow up PT: It is recommended you return every 3 months for the  first 3 years following surgery to be assessed on the SOZO machine for an L-Dex score. This helps prevent clinically significant lymphedema in 95% of patients. These follow up screens are 10 minute appointments that you are not billed for. 

## 2021-12-06 ENCOUNTER — Telehealth: Payer: Self-pay

## 2021-12-06 NOTE — Telephone Encounter (Signed)
Pt called and reports she started having diarrhea today; reports 7-8 episodes of "watery, stringy"" loose stool. Pt states the Bms are small but frequent. Advised pt to try imodium as directed per packaging and to make sure she is drinking plenty of fluids, eating a bland diet and avoiding dairy at this time. Pt knows to call if sx do not improve with imodium.

## 2021-12-08 MED FILL — Dexamethasone Sodium Phosphate Inj 100 MG/10ML: INTRAMUSCULAR | Qty: 1 | Status: AC

## 2021-12-09 ENCOUNTER — Encounter: Payer: Self-pay | Admitting: Adult Health

## 2021-12-09 ENCOUNTER — Inpatient Hospital Stay: Payer: Medicare Other

## 2021-12-09 ENCOUNTER — Inpatient Hospital Stay (HOSPITAL_BASED_OUTPATIENT_CLINIC_OR_DEPARTMENT_OTHER): Payer: Medicare Other | Admitting: Adult Health

## 2021-12-09 ENCOUNTER — Other Ambulatory Visit: Payer: Self-pay

## 2021-12-09 ENCOUNTER — Other Ambulatory Visit (HOSPITAL_COMMUNITY): Payer: Medicare Other

## 2021-12-09 VITALS — BP 124/75 | HR 78 | Temp 97.9°F | Resp 18 | Ht 64.0 in | Wt 229.4 lb

## 2021-12-09 DIAGNOSIS — Z5111 Encounter for antineoplastic chemotherapy: Secondary | ICD-10-CM | POA: Diagnosis not present

## 2021-12-09 DIAGNOSIS — C50412 Malignant neoplasm of upper-outer quadrant of left female breast: Secondary | ICD-10-CM

## 2021-12-09 DIAGNOSIS — Z170 Estrogen receptor positive status [ER+]: Secondary | ICD-10-CM

## 2021-12-09 DIAGNOSIS — Z95828 Presence of other vascular implants and grafts: Secondary | ICD-10-CM

## 2021-12-09 LAB — CBC WITH DIFFERENTIAL (CANCER CENTER ONLY)
Abs Immature Granulocytes: 0.01 10*3/uL (ref 0.00–0.07)
Basophils Absolute: 0 10*3/uL (ref 0.0–0.1)
Basophils Relative: 0 %
Eosinophils Absolute: 0.1 10*3/uL (ref 0.0–0.5)
Eosinophils Relative: 4 %
HCT: 34.9 % — ABNORMAL LOW (ref 36.0–46.0)
Hemoglobin: 11.3 g/dL — ABNORMAL LOW (ref 12.0–15.0)
Immature Granulocytes: 0 %
Lymphocytes Relative: 56 %
Lymphs Abs: 1.3 10*3/uL (ref 0.7–4.0)
MCH: 26.8 pg (ref 26.0–34.0)
MCHC: 32.4 g/dL (ref 30.0–36.0)
MCV: 82.7 fL (ref 80.0–100.0)
Monocytes Absolute: 0.2 10*3/uL (ref 0.1–1.0)
Monocytes Relative: 8 %
Neutro Abs: 0.7 10*3/uL — ABNORMAL LOW (ref 1.7–7.7)
Neutrophils Relative %: 32 %
Platelet Count: 226 10*3/uL (ref 150–400)
RBC: 4.22 MIL/uL (ref 3.87–5.11)
RDW: 14.3 % (ref 11.5–15.5)
WBC Count: 2.3 10*3/uL — ABNORMAL LOW (ref 4.0–10.5)
nRBC: 0 % (ref 0.0–0.2)

## 2021-12-09 LAB — CMP (CANCER CENTER ONLY)
ALT: 19 U/L (ref 0–44)
AST: 18 U/L (ref 15–41)
Albumin: 3.8 g/dL (ref 3.5–5.0)
Alkaline Phosphatase: 86 U/L (ref 38–126)
Anion gap: 4 — ABNORMAL LOW (ref 5–15)
BUN: 12 mg/dL (ref 8–23)
CO2: 30 mmol/L (ref 22–32)
Calcium: 9.2 mg/dL (ref 8.9–10.3)
Chloride: 106 mmol/L (ref 98–111)
Creatinine: 0.77 mg/dL (ref 0.44–1.00)
GFR, Estimated: >60 mL/min (ref >60)
Glucose, Bld: 106 mg/dL — ABNORMAL HIGH (ref 70–99)
Potassium: 3.9 mmol/L (ref 3.5–5.1)
Sodium: 140 mmol/L (ref 135–145)
Total Bilirubin: 0.5 mg/dL (ref 0.3–1.2)
Total Protein: 6.2 g/dL — ABNORMAL LOW (ref 6.5–8.1)

## 2021-12-09 MED ORDER — SODIUM CHLORIDE 0.9% FLUSH
10.0000 mL | Freq: Once | INTRAVENOUS | Status: AC
  Administered 2021-12-09: 10 mL

## 2021-12-09 MED ORDER — DIPHENOXYLATE-ATROPINE 2.5-0.025 MG PO TABS: 1.0000 | ORAL_TABLET | Freq: Four times a day (QID) | ORAL | 0 refills | Status: DC | PRN

## 2021-12-09 MED ORDER — HEPARIN SOD (PORK) LOCK FLUSH 100 UNIT/ML IV SOLN
500.0000 [IU] | Freq: Once | INTRAVENOUS | Status: AC
  Administered 2021-12-09: 500 [IU]

## 2021-12-09 NOTE — Addendum Note (Signed)
Addended by: Carlene Coria L on: 12/09/2021 09:47 AM   Modules accepted: Orders

## 2021-12-09 NOTE — Assessment & Plan Note (Signed)
Karen Vazquez is a 66 year old woman with stage Ia estrogen and HER2 positive breast cancer diagnosed in June 2023 status postlumpectomy and now receiving adjuvant Taxol and Herceptin.  Today she is due for cycle 3 of Taxol and Herceptin weekly.  Her ANC is 0.7 and she is having increased diarrhea.  We will hold her chemotherapy and Herceptin today and she will return in 1 week.  I reviewed this with Dr. Lindi Adie who would like to dose reduce her Taxol.  I also sent in Lomotil for her to take and offered her IV fluids however she declined IV fluids feeling that she is hydrated.  Her c-Met did not show any signs of dehydration her creatinine was stable and potassium was higher than it was last week at 3.9.  We will see Karen Vazquez back in 1 week for labs, and her next infusion.

## 2021-12-09 NOTE — Progress Notes (Signed)
[facility] Follow up:    Kathyrn Drown, MD [place] [address]   DIAGNOSIS:  Cancer Staging  Malignant neoplasm of upper-outer quadrant of left breast in female, estrogen receptor positive ([place]) Staging form: Breast, AJCC 8th Edition - Clinical: Stage IA (cT1b, cN0, cM0, G3, ER+, PR-, HER2+) - Signed by Nicholas Lose, MD on 09/15/2021 Stage prefix: Initial diagnosis Histologic grading system: 3 grade system   SUMMARY OF ONCOLOGIC HISTORY: Oncology History  Malignant neoplasm of upper-outer quadrant of left breast in female, estrogen receptor positive ([place])  09/07/2021 Initial Diagnosis   Screening mammogram detected left breast mass by ultrasound 2 o'clock position measured 1.4 cm axilla was negative.  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 Report date is 10/04/2021.  The CustomNext-Cancer+RNAinsight panel offered by [facility] includes sequencing and rearrangement analysis for the following 47 genes:  APC, ATM, AXIN2, BARD1, BMPR1A, BRCA1, BRCA2, BRIP1, CDH1, CDK4, CDKN2A, CHEK2, CTNNA1, DICER1, EPCAM, GREM1, HOXB13, KIT, MEN1, MLH1, MSH2, MSH3, MSH6, MUTYH, NBN, NF1, NTHL1, PALB2, PDGFRA, PMS2, POLD1, POLE, PTEN, RAD50, RAD51C, RAD51D, SDHA, SDHB, SDHC, SDHD, SMAD4, SMARCA4, STK11, TP53, TSC1, TSC2, and VHL.  RNA data is routinely analyzed for use in variant interpretation for all genes.   11/25/2021 -  Chemotherapy   Patient is on Treatment Plan : BREAST Paclitaxel + Trastuzumab q7d / Trastuzumab q21d       CURRENT THERAPY: Taxol/Herceptin  INTERVAL  HISTORY: BETSI CRESPI 66 y.o. female returns for evaluation prior to receiving treatment.  She is doing moderately well.  She developed diarrhea that began on Monday (9/18) as much as 7 episodes a day.  She called and was instructed to use Desitin and avoid diarrhea, and use imodium.  Her backside is "on fire".     Patient Active Problem List   Diagnosis Date Noted   Port-A-Cath in place 11/25/2021   Genetic testing 09/20/2021   Family history of prostate cancer in father 09/15/2021   Family history of breast cancer 09/15/2021   Malignant neoplasm of upper-outer quadrant of left breast in female, estrogen receptor positive ([place]) 09/09/2021   Menopausal symptom 08/10/2021   UTI (urinary tract infection) 08/10/2021   Eating disorder 04/01/2021   Lesion of vulva 12/22/2020   History of iron deficiency anemia 10/27/2020   Cataract, nuclear sclerotic, both eyes 01/10/2020   Duodenal ulcer 06/24/2019   Fracture of distal end of radius 01/22/2018   History of total knee replacement, right 06/08/2017   History of colonic polyps 04/26/2016   OA (osteoarthritis) of knee 11/17/2014   Essential hypertension 05/28/2014   Obstructive sleep apnea 05/28/2014   Anemia, iron deficiency 12/16/2013   Prediabetes 05/01/2013   GERD (gastroesophageal reflux disease) 02/06/2013   Hypothyroidism 12/12/2012   Insomnia 12/12/2012   Hyperlipidemia 12/12/2012   Osteoarthritis 12/12/2012   Restless legs syndrome 12/12/2012   S/P LASIK surgery of both eyes 03/21/1998    is allergic to lisinopril.  MEDICAL HISTORY: Past Medical History:  Diagnosis Date   Anemia    Anxiety    Arthritis    Back pain  Blood transfusion 2011   at [facility]   Bulging lumbar disc    Chest pain    Diabetes mellitus without complication (HCC)    Dry mouth    Easy bruising    Excessive thirst    Fatigue    Frequent urination    GERD (gastroesophageal reflux disease)    Headache    Heat intolerance    Hypertension     Hypothyroidism    Joint pain    Leg pain    Muscle stiffness    Nervousness    Palpitations    Pre-diabetes    Restless leg syndrome    Shortness of breath on exertion    Sleep apnea    does not use c-pap machine   Stomach ulcer    Stress    Swelling of both lower extremities    Trouble in sleeping    Vitamin D deficiency    Weakness     SURGICAL HISTORY: Past Surgical History:  Procedure Laterality Date   APPENDECTOMY     BIOPSY  05/16/2019   Procedure: BIOPSY;  Surgeon: Rogene Houston, MD;  [facility];  Service: Endoscopy;;  duodenum antral   BLADDER SUSPENSION  12/29/2010   Procedure: TRANSVAGINAL TAPE (TVT) PROCEDURE;  Surgeon: Daria Pastures;  Location: [facility];  Service: Gynecology;  Laterality: N/A;   BREAST LUMPECTOMY WITH RADIOACTIVE SEED AND SENTINEL LYMPH NODE BIOPSY Left 10/26/2021   Procedure: LEFT BREAST SEED LUMPECTOMY WITH LEFT SENTINEL LYMPH NODE MAPPING;  Surgeon: Erroll Luna, MD;  Location: [facility] [facility];  Service: General;  Laterality: Left;   BREAST SURGERY     left breast lumpectomy 1977   CATARACT EXTRACTION     COLONOSCOPY N/A 03/03/2016   Procedure: COLONOSCOPY;  Surgeon: Rogene Houston, MD;  [facility];  Service: Endoscopy;  Laterality: N/A;  2:00 - moved to 12/14 @ 12:55 - Ann notified pt   CYSTOCELE REPAIR  12/29/2010   Procedure: ANTERIOR REPAIR (CYSTOCELE);  Surgeon: Daria Pastures;  Location: [facility];  Service: Gynecology;  Laterality: N/A;   CYSTOSCOPY  12/29/2010   Procedure: CYSTOSCOPY;  Surgeon: Daria Pastures;  Location: [facility];  Service: Gynecology;  Laterality: N/A;   ESOPHAGOGASTRODUODENOSCOPY N/A 05/16/2019   Procedure: ESOPHAGOGASTRODUODENOSCOPY (EGD);  Surgeon: Rogene Houston, MD;  [facility];  Service: Endoscopy;  Laterality: N/A;   JOINT REPLACEMENT  2011   rt knee   PORTACATH PLACEMENT N/A 10/26/2021   Procedure: PORT PLACEMENT WITH ULTRASOUND GUIDANCE;  Surgeon:  Erroll Luna, MD;  Location: [facility];  Service: General;  Laterality: N/A;   REPLACEMENT TOTAL KNEE Right 2011   TONSILLECTOMY     TOTAL KNEE ARTHROPLASTY Left 11/17/2014   Procedure: LEFT TOTAL KNEE ARTHROPLASTY;  Surgeon: Gaynelle Arabian, MD;  Location: [facility];  Service: Orthopedics;  Laterality: Left;   TUBAL LIGATION     VAGINAL HYSTERECTOMY  12/29/2010   Procedure: HYSTERECTOMY VAGINAL;  Surgeon: Daria Pastures;  Location: [facility];  Service: Gynecology;  Laterality: N/A;    SOCIAL HISTORY: Social History   Socioeconomic History   Marital status: Married    Spouse name: Jori Moll   Number of children: Not on file   Years of education: Not on file   Highest education level: Not on file  Occupational History   Occupation: Retired  Tobacco Use   Smoking status: Never   Smokeless tobacco: Never  Vaping Use   Vaping Use: Never used  Substance and Sexual Activity   Alcohol use: No   Drug use: No   Sexual activity: Yes    Birth control/protection: Post-menopausal, Surgical    Comment: HYST  Other Topics Concern   Not on file  Social History Narrative   Not on file   Social Determinants of Health   Financial Resource Strain: Low Risk  (08/10/2021)   Overall Financial Resource Strain (CARDIA)    Difficulty of Paying Living Expenses: Not very hard  Food Insecurity: No Food Insecurity (08/10/2021)   Hunger Vital Sign    Worried About Running Out of Food in the Last Year: Never true    Ran Out of Food in the Last Year: Never true  Transportation Needs: No Transportation Needs (08/10/2021)   PRAPARE - Hydrologist (Medical): No    Lack of Transportation (Non-Medical): No  Physical Activity: Insufficiently Active (08/10/2021)   Exercise Vital Sign    Days of Exercise per Week: 3 days    Minutes of Exercise per Session: 30 min  Stress: Stress Concern Present (08/10/2021)   [place]    Feeling of Stress : To some extent  Social Connections: Socially Integrated (08/10/2021)   Social Connection and Isolation Panel [NHANES]    Frequency of Communication with Friends and Family: More than three times a week    Frequency of Social Gatherings with Friends and Family: More than three times a week    Attends Religious Services: More than 4 times per year    Active Member of [employer] or Organizations: Yes    Attends Music therapist: More than 4 times per year    Marital Status: Married  Human resources officer Violence: Not At Risk (08/10/2021)   Humiliation, Afraid, Rape, and Kick questionnaire    Fear of Current or Ex-Partner: No    Emotionally Abused: No    Physically Abused: No    Sexually Abused: No    FAMILY HISTORY: Family History  Problem Relation Age of Onset   Stroke Mother    Hypertension Mother    Hyperlipidemia Mother    Thyroid disease Mother    Heart disease Father    COPD Father    Depression Father    Prostate cancer Father 15 - 55   Breast cancer Paternal Grandmother 68 - 57   Diabetes Paternal Grandfather    Colon cancer Cousin     Review of Systems  Constitutional:  Negative for appetite change, chills, fatigue, fever and unexpected weight change.  HENT:   Negative for hearing loss, lump/mass and trouble swallowing.   Eyes:  Negative for eye problems and icterus.  Respiratory:  Negative for chest tightness, cough and shortness of breath.   Cardiovascular:  Negative for chest pain, leg swelling and palpitations.  Gastrointestinal:  Positive for diarrhea. Negative for abdominal distention, abdominal pain, constipation, nausea and vomiting.  Endocrine: Negative for hot flashes.  Genitourinary:  Negative for difficulty urinating.   Musculoskeletal:  Negative for arthralgias.  Skin:  Negative for itching and rash.  Neurological:  Negative for dizziness, extremity weakness, headaches and numbness.  Hematological:   Negative for adenopathy. Does not bruise/bleed easily.  Psychiatric/Behavioral:  Negative for depression. The patient is not nervous/anxious.       PHYSICAL EXAMINATION  ECOG PERFORMANCE STATUS: 1 - Symptomatic but completely ambulatory  Vitals:   12/09/21 0846  BP: 124/75  Pulse: 78  Resp: 18  Temp: 97.9 F (  36.6 C)  SpO2: 97%    Physical Exam Constitutional:      General: She is not in acute distress.    Appearance: Normal appearance. She is not toxic-appearing.  HENT:     Head: Normocephalic and atraumatic.  Eyes:     General: No scleral icterus. Cardiovascular:     Rate and Rhythm: Normal rate and regular rhythm.     Pulses: Normal pulses.     Heart sounds: Normal heart sounds.  Pulmonary:     Effort: Pulmonary effort is normal.     Breath sounds: Normal breath sounds.  Abdominal:     General: Abdomen is flat. Bowel sounds are normal. There is no distension.     Palpations: Abdomen is soft.     Tenderness: There is no abdominal tenderness.  Musculoskeletal:        General: No swelling.     Cervical back: Neck supple.  Lymphadenopathy:     Cervical: No cervical adenopathy.  Skin:    General: Skin is warm and dry.     Findings: No rash.  Neurological:     General: No focal deficit present.     Mental Status: She is alert.  Psychiatric:        Mood and Affect: Mood normal.        Behavior: Behavior normal.     LABORATORY DATA:  CBC    Component Value Date/Time   WBC 2.3 (L) 12/09/2021 0812   WBC 5.7 05/16/2019 0535   RBC 4.22 12/09/2021 0812   HGB 11.3 (L) 12/09/2021 0812   HGB 14.2 02/24/2021 1018   HCT 34.9 (L) 12/09/2021 0812   HCT 43.0 02/24/2021 1018   PLT 226 12/09/2021 0812   PLT 156 02/24/2021 1018   MCV 82.7 12/09/2021 0812   MCV 83 02/24/2021 1018   MCH 26.8 12/09/2021 0812   MCHC 32.4 12/09/2021 0812   RDW 14.3 12/09/2021 0812   RDW 14.2 02/24/2021 1018   LYMPHSABS 1.3 12/09/2021 0812   LYMPHSABS 1.8 02/24/2021 1018   MONOABS  0.2 12/09/2021 0812   EOSABS 0.1 12/09/2021 0812   EOSABS 0.1 02/24/2021 1018   BASOSABS 0.0 12/09/2021 0812   BASOSABS 0.0 02/24/2021 1018    CMP     Component Value Date/Time   NA 140 12/09/2021 0812   NA 143 02/24/2021 1018   K 3.9 12/09/2021 0812   CL 106 12/09/2021 0812   CO2 30 12/09/2021 0812   GLUCOSE 106 (H) 12/09/2021 0812   BUN 12 12/09/2021 0812   BUN 12 02/24/2021 1018   CREATININE 0.77 12/09/2021 0812   CREATININE 0.89 12/12/2012 0813   CALCIUM 9.2 12/09/2021 0812   PROT 6.2 (L) 12/09/2021 0812   PROT 6.8 02/24/2021 1018   ALBUMIN 3.8 12/09/2021 0812   ALBUMIN 4.6 02/24/2021 1018   AST 18 12/09/2021 0812   ALT 19 12/09/2021 0812   ALKPHOS 86 12/09/2021 0812   BILITOT 0.5 12/09/2021 0812   GFRNONAA >60 12/09/2021 0812   GFRAA 76 02/06/2020 1054      ASSESSMENT and THERAPY PLAN:   Malignant neoplasm of upper-outer quadrant of left breast in female, estrogen receptor positive ([place]) Cherae is a 66 year old woman with stage Ia estrogen and HER2 positive breast cancer diagnosed in June 2023 status postlumpectomy and now receiving adjuvant Taxol and Herceptin.  Today she is due for cycle 3 of Taxol and Herceptin weekly.  Her ANC is 0.7 and she is having increased diarrhea.  We will hold her  chemotherapy and Herceptin today and she will return in 1 week.  I reviewed this with Dr. Lindi Adie who would like to dose reduce her Taxol.  I also sent in Lomotil for her to take and offered her IV fluids however she declined IV fluids feeling that she is hydrated.  Her c-Met did not show any signs of dehydration her creatinine was stable and potassium was higher than it was last week at 3.9.  We will see Alaysiah back in 1 week for labs, and her next infusion.    All questions were answered. The patient knows to call the clinic with any problems, questions or concerns. We can certainly see the patient much sooner if necessary.  Total encounter time:30 minutes*in face-to-face  visit time, chart review, lab review, care coordination, order entry, and documentation of the encounter time.    Wilber Bihari, NP 12/09/21 10:22 AM Medical Oncology and Hematology [facility] [place], [place] [address] Tel. [phone]    Fax. [phone]  *Total Encounter Time as defined by the Centers for Medicare and Medicaid Services includes, in addition to the face-to-face time of a patient visit (documented in the note above) non-face-to-face time: obtaining and reviewing outside history, ordering and reviewing medications, tests or procedures, care coordination (communications with other health care professionals or caregivers) and documentation in the medical record.

## 2021-12-13 ENCOUNTER — Other Ambulatory Visit: Payer: Self-pay

## 2021-12-13 ENCOUNTER — Inpatient Hospital Stay (HOSPITAL_BASED_OUTPATIENT_CLINIC_OR_DEPARTMENT_OTHER): Payer: Medicare Other | Admitting: Physician Assistant

## 2021-12-13 ENCOUNTER — Telehealth: Payer: Self-pay

## 2021-12-13 VITALS — BP 122/58 | HR 77 | Temp 98.3°F | Resp 16

## 2021-12-13 DIAGNOSIS — Z5111 Encounter for antineoplastic chemotherapy: Secondary | ICD-10-CM | POA: Diagnosis not present

## 2021-12-13 DIAGNOSIS — Z170 Estrogen receptor positive status [ER+]: Secondary | ICD-10-CM

## 2021-12-13 DIAGNOSIS — B3789 Other sites of candidiasis: Secondary | ICD-10-CM

## 2021-12-13 DIAGNOSIS — R197 Diarrhea, unspecified: Secondary | ICD-10-CM | POA: Diagnosis not present

## 2021-12-13 DIAGNOSIS — C50412 Malignant neoplasm of upper-outer quadrant of left female breast: Secondary | ICD-10-CM

## 2021-12-13 MED ORDER — FLUCONAZOLE 150 MG PO TABS: 150.0000 mg | ORAL_TABLET | ORAL | 0 refills | Status: DC

## 2021-12-13 NOTE — Telephone Encounter (Signed)
Patient called [facility] regarding irritation around her anus due to loose stools last week. Patient states she is no longer having loose stools, but she thinks that her skin is raw due to having to wipe a lot. Patient has tried multiple creams and sitz bath and her skin is still irritated. Appointment made for her to be evaluated at [facility] today. Patient declines fever, nausea, vomiting or any other symptoms.

## 2021-12-13 NOTE — Progress Notes (Signed)
Symptom Management Consult note [place]    Patient Care Team: Karen Drown, MD as PCP - General (Family Medicine) Karen Germany, RN as Oncology Nurse Navigator Karen Kaufmann, RN as Oncology Nurse Navigator Karen Luna, MD as Consulting Physician (General Surgery) Karen Lose, MD as Consulting Physician (Hematology and Oncology) Karen Gibson, MD as Attending Physician (Radiation Oncology)    Name of the patient: Karen Vazquez  [id number]  1956-01-11   Date of visit: 12/13/2021   Chief Complaint/Reason for visit: rectal pain   Current Therapy: Taxol and Ogivri  Last treatment:  Day 8   Cycle 1 on 12/02/21   ASSESSMENT & PLAN: Patient is a 66 y.o. female  with oncologic history of Malignant neoplasm of upper-outer quadrant of left breast in female, estrogen receptor positive  followed by Dr. Lindi Vazquez.  I have viewed most recent oncology note and lab work.    #) Malignant neoplasm of upper-outer quadrant of left breast in female, estrogen receptor positive  -Last treatment was held 2/2 diarrhea and low ANC 0.7.  Next treatment is scheduled for 12/16/21 - Next appointment with oncologist is 12/23/21.   #) Diarrhea -Was prescribed Lomotil at last oncology visit on 12/09/21. -Took lomotil x 2 days ago and diarrhea has subsided. -No signs of dehydration on exam  #)Candidiasis - Patient non toxic appearing. Afebrile, HDS. -Exam consistent with anal yeast infection. No hemorrhoids or vesicular lesions. No concern for bacterial infection at this time. -History of pre-diabetes and being immunocompromised 2/2 chemotherapy makes her more susceptible to candidiasis. I viewed most recent labs from 09/21 with WBC 2.3 and ANC 0.7.  -Discussed topical vs PO treatment options. Patient agreeable to PO fluconazole q72 hours x 3 doses and OTC miconazole. -Will see patient in infusion at upcoming appointment to recheck symptoms. - Strict ED precautions  discussed should symptoms worsen.           Heme/Onc History: Oncology History  Malignant neoplasm of upper-outer quadrant of left breast in female, estrogen receptor positive ([place])  09/07/2021 Initial Diagnosis   Screening mammogram detected left breast mass by ultrasound 2 o'clock position measured 1.4 cm axilla was negative.  Ultrasound-guided biopsy revealed grade 2 IDC with focal necrosis ER 90% weak, PR 0%, Ki-67 55%, HER2 3+ positive   09/15/2021 Cancer Staging   Staging form: Breast, AJCC 8th Edition - Clinical: Stage IA (cT1b, cN0, cM0, G3, ER+, PR-, HER2+) - Signed by Karen Lose, MD on 09/15/2021 Stage prefix: Initial diagnosis Histologic grading system: 3 grade system    Genetic Testing   [facility] CustomNext Panel was Negative. Report date is 10/04/2021.  The CustomNext-Cancer+RNAinsight panel offered by [facility] includes sequencing and rearrangement analysis for the following 47 genes:  APC, ATM, AXIN2, BARD1, BMPR1A, BRCA1, BRCA2, BRIP1, CDH1, CDK4, CDKN2A, CHEK2, CTNNA1, DICER1, EPCAM, GREM1, HOXB13, KIT, MEN1, MLH1, MSH2, MSH3, MSH6, MUTYH, NBN, NF1, NTHL1, PALB2, PDGFRA, PMS2, POLD1, POLE, PTEN, RAD50, RAD51C, RAD51D, SDHA, SDHB, SDHC, SDHD, SMAD4, SMARCA4, STK11, TP53, TSC1, TSC2, and VHL.  RNA data is routinely analyzed for use in variant interpretation for all genes.   11/25/2021 -  Chemotherapy   Patient is on Treatment Plan : BREAST Paclitaxel + Trastuzumab q7d / Trastuzumab q21d         Interval history-: Karen Vazquez is a 66 y.o. female with oncologic history as above presenting to [facility] today with chief complaint of rectal pain x 1 week. Symptoms started after having 7 loose  bowel movements. She reports feeling like the skin around her rectum was raw and on fire. Pain does not radiate. Pain is worse with movement. She has tried taking a sitz bath with baking soda, applying Vaseline and Desitin all which provide minimal relief. The area itches. She wears a  depends "just incase" and denies any incontinence.  Diarrhea has improved and she admits to only 2 episodes of loose stool yesterday that improved after lomotil. Denies any blood in stool. She denies history of hemorrhoids or herpes, fever, chills.    ROS  All other systems are reviewed and are negative for acute change except as noted in the HPI.    Allergies  Allergen Reactions   Lisinopril Cough     Past Medical History:  Diagnosis Date   Anemia    Anxiety    Arthritis    Back pain    Blood transfusion 2011   at [facility]   Bulging lumbar disc    Chest pain    Diabetes mellitus without complication (HCC)    Dry mouth    Easy bruising    Excessive thirst    Fatigue    Frequent urination    GERD (gastroesophageal reflux disease)    Headache    Heat intolerance    Hypertension    Hypothyroidism    Joint pain    Leg pain    Muscle stiffness    Nervousness    Palpitations    Pre-diabetes    Restless leg syndrome    Shortness of breath on exertion    Sleep apnea    does not use c-pap machine   Stomach ulcer    Stress    Swelling of both lower extremities    Trouble in sleeping    Vitamin D deficiency    Weakness      Past Surgical History:  Procedure Laterality Date   APPENDECTOMY     BIOPSY  05/16/2019   Procedure: BIOPSY;  Surgeon: Rogene Houston, MD;  [facility];  Service: Endoscopy;;  duodenum antral   BLADDER SUSPENSION  12/29/2010   Procedure: TRANSVAGINAL TAPE (TVT) PROCEDURE;  Surgeon: Daria Pastures;  Location: [facility];  Service: Gynecology;  Laterality: N/A;   BREAST LUMPECTOMY WITH RADIOACTIVE SEED AND SENTINEL LYMPH NODE BIOPSY Left 10/26/2021   Procedure: LEFT BREAST SEED LUMPECTOMY WITH LEFT SENTINEL LYMPH NODE MAPPING;  Surgeon: Karen Luna, MD;  Location: [place];  Service: General;  Laterality: Left;   BREAST SURGERY     left breast lumpectomy 1977   CATARACT EXTRACTION     COLONOSCOPY N/A 03/03/2016    Procedure: COLONOSCOPY;  Surgeon: Rogene Houston, MD;  [facility]: [facility];  Service: Endoscopy;  Laterality: N/A;  2:00 - moved to 12/14 @ 12:55 - Ann notified pt   CYSTOCELE REPAIR  12/29/2010   Procedure: ANTERIOR REPAIR (CYSTOCELE);  Surgeon: Daria Pastures;  Location: [facility];  Service: Gynecology;  Laterality: N/A;   CYSTOSCOPY  12/29/2010   Procedure: CYSTOSCOPY;  Surgeon: Daria Pastures;  Location: [facility] [facility];  Service: Gynecology;  Laterality: N/A;   ESOPHAGOGASTRODUODENOSCOPY N/A 05/16/2019   Procedure: ESOPHAGOGASTRODUODENOSCOPY (EGD);  Surgeon: Rogene Houston, MD;  [facility];  Service: Endoscopy;  Laterality: N/A;   JOINT REPLACEMENT  2011   rt knee   PORTACATH PLACEMENT N/A 10/26/2021   Procedure: PORT PLACEMENT WITH ULTRASOUND GUIDANCE;  Surgeon: Karen Luna, MD;  Location: [place];  Service: General;  Laterality: N/A;   REPLACEMENT TOTAL KNEE Right 2011   TONSILLECTOMY     TOTAL KNEE ARTHROPLASTY Left 11/17/2014   Procedure: LEFT TOTAL KNEE ARTHROPLASTY;  Surgeon: Gaynelle Arabian, MD;  Location: [facility];  Service: Orthopedics;  Laterality: Left;   TUBAL LIGATION     VAGINAL HYSTERECTOMY  12/29/2010   Procedure: HYSTERECTOMY VAGINAL;  Surgeon: Daria Pastures;  Location: [place] [facility];  Service: Gynecology;  Laterality: N/A;    Social History   Socioeconomic History   Marital status: Married    Spouse name: Jori Moll   Number of children: Not on file   Years of education: Not on file   Highest education level: Not on file  Occupational History   Occupation: Retired  Tobacco Use   Smoking status: Never   Smokeless tobacco: Never  Vaping Use   Vaping Use: Never used  Substance and Sexual Activity   Alcohol use: No   Drug use: No   Sexual activity: Yes    Birth control/protection: Post-menopausal, Surgical    Comment: HYST  Other Topics Concern   Not on file  Social History Narrative   Not on file   Social Determinants of Health    Financial Resource Strain: Low Risk  (08/10/2021)   Overall Financial Resource Strain (CARDIA)    Difficulty of Paying Living Expenses: Not very hard  Food Insecurity: No Food Insecurity (08/10/2021)   Hunger Vital Sign    Worried About Running Out of Food in the Last Year: Never true    Ran Out of Food in the Last Year: Never true  Transportation Needs: No Transportation Needs (08/10/2021)   PRAPARE - Hydrologist (Medical): No    Lack of Transportation (Non-Medical): No  Physical Activity: Insufficiently Active (08/10/2021)   Exercise Vital Sign    Days of Exercise per Week: 3 days    Minutes of Exercise per Session: 30 min  Stress: Stress Concern Present (08/10/2021)   [place]    Feeling of Stress : To some extent  Social Connections: Socially Integrated (08/10/2021)   Social Connection and Isolation Panel [NHANES]    Frequency of Communication with Friends and Family: More than three times a week    Frequency of Social Gatherings with Friends and Family: More than three times a week    Attends Religious Services: More than 4 times per year    Active Member of [employer] or Organizations: Yes    Attends Music therapist: More than 4 times per year    Marital Status: Married  Human resources officer Violence: Not At Risk (08/10/2021)   Humiliation, Afraid, Rape, and Kick questionnaire    Fear of Current or Ex-Partner: No    Emotionally Abused: No    Physically Abused: No    Sexually Abused: No    Family History  Problem Relation Age of Onset   Stroke Mother    Hypertension Mother    Hyperlipidemia Mother    Thyroid disease Mother    Heart disease Father    COPD Father    Depression Father    Prostate cancer Father 5 - 12   Breast cancer Paternal Grandmother 69 - 69   Diabetes Paternal Grandfather    Colon cancer Cousin      Current Outpatient Medications:    fluconazole  (DIFLUCAN) 150 MG tablet, Take 1 tablet (150 mg total) by mouth every 3 (three) days for 3 doses., Disp: 3  tablet, Rfl: 0   ALPRAZolam (XANAX) 0.5 MG tablet, TAKE ONE TABLET BY MOUTH TWO TIMES DAILYAS NEEDED FOR ANXIETY OR SLEEP., Disp: 30 tablet, Rfl: 5   amLODipine (NORVASC) 2.5 MG tablet, Take 1 tablet (2.5 mg total) by mouth daily., Disp: 30 tablet, Rfl: 5   ARMOUR THYROID 15 MG tablet, TAKE ONE TABLET ONCE DAILY (Patient taking differently: TAKES TOTAL 45MG DAILY, TAKES 30 MG), Disp: 30 tablet, Rfl: 5   diphenoxylate-atropine (LOMOTIL) 2.5-0.025 MG tablet, Take 1 tablet by mouth 4 (four) times daily as needed for diarrhea or loose stools., Disp: 30 tablet, Rfl: 0   lidocaine-prilocaine (EMLA) cream, Apply to affected area once, Disp: 30 g, Rfl: 3   ondansetron (ZOFRAN) 8 MG tablet, Take 1 tablet (8 mg total) by mouth every 8 (eight) hours as needed for nausea or vomiting., Disp: 30 tablet, Rfl: 1   oxybutynin (DITROPAN-XL) 5 MG 24 hr tablet, Take by mouth., Disp: , Rfl:    pantoprazole (PROTONIX) 40 MG tablet, Take 1 tablet (40 mg total) by mouth daily., Disp: 30 tablet, Rfl: 5   pregabalin (LYRICA) 50 MG capsule, 1 each evening, Disp: 30 capsule, Rfl: 5   prochlorperazine (COMPAZINE) 10 MG tablet, Take 1 tablet (10 mg total) by mouth every 6 (six) hours as needed for nausea or vomiting., Disp: 30 tablet, Rfl: 1   thyroid (ARMOUR THYROID) 30 MG tablet, TAKE ONE (1) TABLET BY MOUTH EVERY DAY, Disp: 30 tablet, Rfl: 5  PHYSICAL EXAM: ECOG FS:1 - Symptomatic but completely ambulatory    Vitals:   12/13/21 1056  BP: (!) 122/58  Pulse: 77  Resp: 16  Temp: 98.3 F (36.8 C)  TempSrc: Oral  SpO2: 98%   Physical Exam Vitals and nursing note reviewed.  Constitutional:      Appearance: She is well-developed. She is not ill-appearing or toxic-appearing.  HENT:     Head: Normocephalic.     Nose: Nose normal.  Eyes:     Conjunctiva/sclera: Conjunctivae normal.  Neck:     Vascular: No JVD.   Cardiovascular:     Rate and Rhythm: Normal rate and regular rhythm.     Pulses: Normal pulses.     Heart sounds: Normal heart sounds.  Pulmonary:     Effort: Pulmonary effort is normal.     Breath sounds: Normal breath sounds.  Abdominal:     General: There is no distension.  Genitourinary:    Comments: Exam chaperoned by Lanette Hampshire. Erythematous macerated skin surrounding rectum. No visible hemorrhoid or vesicular lesion. Tender to palpation. No palpable fluctuance or gross abscess. Musculoskeletal:     Cervical back: Normal range of motion.  Skin:    General: Skin is warm and dry.  Neurological:     Mental Status: She is oriented to person, place, and time.        LABORATORY DATA: I have reviewed the data as listed    Latest Ref Rng & Units 12/09/2021    8:12 AM 12/02/2021    1:34 PM 11/25/2021   11:30 AM  CBC  WBC 4.0 - 10.5 K/uL 2.3  7.6  5.7   Hemoglobin 12.0 - 15.0 g/dL 11.3  11.5  12.5   Hematocrit 36.0 - 46.0 % 34.9  35.4  38.0   Platelets 150 - 400 K/uL 226  142  134         Latest Ref Rng & Units 12/09/2021    8:12 AM 12/02/2021    1:34 PM 11/25/2021  11:30 AM  CMP  Glucose 70 - 99 mg/dL 106  155  91   BUN 8 - 23 mg/dL _0 Creatinine 0.44 - 1.00 mg/dL 0.77  0.82  0.87   Sodium 135 - 145 mmol/L 140  136  140   Potassium 3.5 - 5.1 mmol/L 3.9  3.7  3.9   Chloride 98 - 111 mmol/L 106  104  108   CO2 22 - 32 mmol/L _1 Calcium 8.9 - 10.3 mg/dL 9.2  9.2  9.2   Total Protein 6.5 - 8.1 g/dL 6.2  6.2  6.7   Total Bilirubin 0.3 - 1.2 mg/dL 0.5  0.7  0.4   Alkaline Phos 38 - 126 U/L 86  80  107   AST 15 - 41 U/L _2 ALT 0 - 44 U/L _3 RADIOGRAPHIC STUDIES (from last 24 hours if applicable) I have personally reviewed the radiological images as listed and agreed with the findings in the report. No results found.      Visit Diagnosis: 1. Candidiasis of anus   2. Malignant neoplasm of upper-outer quadrant of left  breast in female, estrogen receptor positive ([place])   3. Diarrhea, unspecified type      No orders of the defined types were placed in this encounter.   All questions were answered. The patient knows to call the clinic with any problems, questions or concerns. No barriers to learning was detected.  I have spent a total of 30 minutes minutes of face-to-face and non-face-to-face time, preparing to see the patient, obtaining and/or reviewing separately obtained history, performing a medically appropriate examination, counseling and educating the patient, ordering tests, documenting clinical information in the electronic health record, and prescription management.    Thank you for allowing me to participate in the care of this patient.    Barrie Folk, PA-C Department of Hematology/Oncology [facility] at [facility] Phone: [phone]  [phone]    12/13/2021 8:47 PM

## 2021-12-14 ENCOUNTER — Telehealth: Payer: Self-pay

## 2021-12-14 NOTE — Telephone Encounter (Signed)
S/w pt in regards to visit with [facility] yesrerday. Pt states she is feeling much better, and picked up diflucan, and is using monistat. BM's are not as painful now since using topical monistat. Advised pt to avoid underwear when possible, but when she must wear underwear to make sure it is cotton. She verbalized thanks and understanding and knows to call for any further concerns.

## 2021-12-14 NOTE — Telephone Encounter (Signed)
Attempted to call pt and LVM to check on her following up after her visit 9/25 with [facility]. LVM for call back.

## 2021-12-15 MED FILL — Dexamethasone Sodium Phosphate Inj 100 MG/10ML: INTRAMUSCULAR | Qty: 1 | Status: AC

## 2021-12-16 ENCOUNTER — Inpatient Hospital Stay (HOSPITAL_BASED_OUTPATIENT_CLINIC_OR_DEPARTMENT_OTHER): Payer: Medicare Other | Admitting: Physician Assistant

## 2021-12-16 ENCOUNTER — Inpatient Hospital Stay: Payer: Medicare Other

## 2021-12-16 ENCOUNTER — Other Ambulatory Visit: Payer: Self-pay

## 2021-12-16 VITALS — BP 122/65 | HR 72 | Temp 98.1°F | Resp 16 | Wt 232.5 lb

## 2021-12-16 DIAGNOSIS — Z5111 Encounter for antineoplastic chemotherapy: Secondary | ICD-10-CM | POA: Diagnosis not present

## 2021-12-16 DIAGNOSIS — B3789 Other sites of candidiasis: Secondary | ICD-10-CM

## 2021-12-16 DIAGNOSIS — Z170 Estrogen receptor positive status [ER+]: Secondary | ICD-10-CM

## 2021-12-16 DIAGNOSIS — Z95828 Presence of other vascular implants and grafts: Secondary | ICD-10-CM

## 2021-12-16 DIAGNOSIS — C50412 Malignant neoplasm of upper-outer quadrant of left female breast: Secondary | ICD-10-CM

## 2021-12-16 LAB — CMP (CANCER CENTER ONLY)
ALT: 12 U/L (ref 0–44)
AST: 19 U/L (ref 15–41)
Albumin: 3.8 g/dL (ref 3.5–5.0)
Alkaline Phosphatase: 99 U/L (ref 38–126)
Anion gap: 7 (ref 5–15)
BUN: 11 mg/dL (ref 8–23)
CO2: 30 mmol/L (ref 22–32)
Calcium: 8.9 mg/dL (ref 8.9–10.3)
Chloride: 103 mmol/L (ref 98–111)
Creatinine: 0.92 mg/dL (ref 0.44–1.00)
GFR, Estimated: >60 mL/min (ref >60)
Glucose, Bld: 112 mg/dL — ABNORMAL HIGH (ref 70–99)
Potassium: 4.3 mmol/L (ref 3.5–5.1)
Sodium: 140 mmol/L (ref 135–145)
Total Bilirubin: 0.3 mg/dL (ref 0.3–1.2)
Total Protein: 6.5 g/dL (ref 6.5–8.1)

## 2021-12-16 LAB — CBC WITH DIFFERENTIAL (CANCER CENTER ONLY)
Abs Immature Granulocytes: 0.06 10*3/uL (ref 0.00–0.07)
Basophils Absolute: 0 10*3/uL (ref 0.0–0.1)
Basophils Relative: 1 %
Eosinophils Absolute: 0.1 10*3/uL (ref 0.0–0.5)
Eosinophils Relative: 1 %
HCT: 33.9 % — ABNORMAL LOW (ref 36.0–46.0)
Hemoglobin: 11.1 g/dL — ABNORMAL LOW (ref 12.0–15.0)
Immature Granulocytes: 1 %
Lymphocytes Relative: 40 %
Lymphs Abs: 1.7 10*3/uL (ref 0.7–4.0)
MCH: 26.9 pg (ref 26.0–34.0)
MCHC: 32.7 g/dL (ref 30.0–36.0)
MCV: 82.1 fL (ref 80.0–100.0)
Monocytes Absolute: 0.5 10*3/uL (ref 0.1–1.0)
Monocytes Relative: 12 %
Neutro Abs: 1.9 10*3/uL (ref 1.7–7.7)
Neutrophils Relative %: 45 %
Platelet Count: 253 10*3/uL (ref 150–400)
RBC: 4.13 MIL/uL (ref 3.87–5.11)
RDW: 14.6 % (ref 11.5–15.5)
WBC Count: 4.2 10*3/uL (ref 4.0–10.5)
nRBC: 0 % (ref 0.0–0.2)

## 2021-12-16 MED ORDER — SODIUM CHLORIDE 0.9% FLUSH
10.0000 mL | Freq: Once | INTRAVENOUS | Status: AC
  Administered 2021-12-16: 10 mL

## 2021-12-16 MED ORDER — FAMOTIDINE IN NACL 20-0.9 MG/50ML-% IV SOLN
20.0000 mg | Freq: Once | INTRAVENOUS | Status: AC
  Administered 2021-12-16: 20 mg via INTRAVENOUS
  Filled 2021-12-16: qty 50

## 2021-12-16 MED ORDER — SODIUM CHLORIDE 0.9 % IV SOLN
10.0000 mg | Freq: Once | INTRAVENOUS | Status: AC
  Administered 2021-12-16: 10 mg via INTRAVENOUS
  Filled 2021-12-16: qty 10

## 2021-12-16 MED ORDER — HEPARIN SOD (PORK) LOCK FLUSH 100 UNIT/ML IV SOLN
500.0000 [IU] | Freq: Once | INTRAVENOUS | Status: AC | PRN
  Administered 2021-12-16: 500 [IU]

## 2021-12-16 MED ORDER — SODIUM CHLORIDE 0.9 % IV SOLN
50.0000 mg/m2 | Freq: Once | INTRAVENOUS | Status: AC
  Administered 2021-12-16: 108 mg via INTRAVENOUS
  Filled 2021-12-16: qty 18

## 2021-12-16 MED ORDER — ACETAMINOPHEN 325 MG PO TABS
650.0000 mg | ORAL_TABLET | Freq: Once | ORAL | Status: AC
  Administered 2021-12-16: 650 mg via ORAL
  Filled 2021-12-16: qty 2

## 2021-12-16 MED ORDER — SODIUM CHLORIDE 0.9 % IV SOLN: Freq: Once | INTRAVENOUS | Status: AC

## 2021-12-16 MED ORDER — TRASTUZUMAB-DKST CHEMO 150 MG IV SOLR
2.0000 mg/kg | Freq: Once | INTRAVENOUS | Status: AC
  Administered 2021-12-16: 210 mg via INTRAVENOUS
  Filled 2021-12-16: qty 10

## 2021-12-16 MED ORDER — CETIRIZINE HCL 10 MG/ML IV SOLN
10.0000 mg | Freq: Once | INTRAVENOUS | Status: AC
  Administered 2021-12-16: 10 mg via INTRAVENOUS
  Filled 2021-12-16: qty 1

## 2021-12-16 MED ORDER — SODIUM CHLORIDE 0.9% FLUSH
10.0000 mL | INTRAVENOUS | Status: DC | PRN
  Administered 2021-12-16: 10 mL

## 2021-12-16 NOTE — Progress Notes (Signed)
Symptom Management Consult note [place]    Patient Care Team: Kathyrn Drown, MD as PCP - General (Family Medicine) Rockwell Germany, RN as Oncology Nurse Navigator Mauro Kaufmann, RN as Oncology Nurse Navigator Erroll Luna, MD as Consulting Physician (General Surgery) Nicholas Lose, MD as Consulting Physician (Hematology and Oncology) Eppie Gibson, MD as Attending Physician (Radiation Oncology)    Name of the patient: Karen Vazquez  [id number]  1956-03-05   Date of visit: 12/16/2021   Chief Complaint/Reason for visit: recheck rash   Current Therapy: Taxol and Ogivri  Last treatment:  Day 8   Cycle 1 on 12/02/21   ASSESSMENT & PLAN: Patient is a 66 y.o. female  with oncologic history of malignant neoplasm of upper-outer quadrant of left breast in female, estrogen receptor positive followed by Dr. Lindi Adie.  I have viewed most recent oncology note and lab work.    #) Malignant neoplasm of upper-outer quadrant of left breast in female, estrogen receptor positive  -Here for treatment today. CBC today without neutropenia. - Next appointment with oncologist is 12/24/21   #)Candidiasis -Rash much improved per patient. Will take second dose of diflucan today and the final one in 3 days. 3 dose course prescribed as she was recently neutropenic. -Encouraged patient to continue Monistat and call clinic if rash worsens.     Heme/Onc History: Oncology History  Malignant neoplasm of upper-outer quadrant of left breast in female, estrogen receptor positive ([place])  09/07/2021 Initial Diagnosis   Screening mammogram detected left breast mass by ultrasound 2 o'clock position measured 1.4 cm axilla was negative.  Ultrasound-guided biopsy revealed grade 2 IDC with focal necrosis ER 90% weak, PR 0%, Ki-67 55%, HER2 3+ positive   09/15/2021 Cancer Staging   Staging form: Breast, AJCC 8th Edition - Clinical: Stage IA (cT1b, cN0, cM0, G3, ER+, PR-, HER2+) - Signed  by Nicholas Lose, MD on 09/15/2021 Stage prefix: Initial diagnosis Histologic grading system: 3 grade system    Genetic Testing   Ambry CustomNext Panel was Negative. Report date is 10/04/2021.  The CustomNext-Cancer+RNAinsight panel offered by Althia Forts includes sequencing and rearrangement analysis for the following 47 genes:  APC, ATM, AXIN2, BARD1, BMPR1A, BRCA1, BRCA2, BRIP1, CDH1, CDK4, CDKN2A, CHEK2, CTNNA1, DICER1, EPCAM, GREM1, HOXB13, KIT, MEN1, MLH1, MSH2, MSH3, MSH6, MUTYH, NBN, NF1, NTHL1, PALB2, PDGFRA, PMS2, POLD1, POLE, PTEN, RAD50, RAD51C, RAD51D, SDHA, SDHB, SDHC, SDHD, SMAD4, SMARCA4, STK11, TP53, TSC1, TSC2, and VHL.  RNA data is routinely analyzed for use in variant interpretation for all genes.   11/25/2021 -  Chemotherapy   Patient is on Treatment Plan : BREAST Paclitaxel + Trastuzumab q7d / Trastuzumab q21d         Interval history-: Karen Vazquez is a 66 y.o. female with oncologic history as above seen in the infusion center today with chief complaint of recheck rash. Patient seen in clinic x 3 days ago for anal candidiasis. She was prescribed diflucan and recommended OTC monistat which she has been using. She reports rash has drastically improved. She rates pain only 2/10 in severity and is tolerable compared to the severe pain she was previously experiencing. She describes the pain as an irritation localized to the rash. Denies fever, chills. Her diarrhea has also resolved. Spouse accompanies her today and provides additional history.   ROS  All other systems are reviewed and are negative for acute change except as noted in the HPI.    Allergies  Allergen Reactions  Lisinopril Cough     Past Medical History:  Diagnosis Date   Anemia    Anxiety    Arthritis    Back pain    Blood transfusion 2011   at [facility]   Bulging lumbar disc    Chest pain    Diabetes mellitus without complication (HCC)    Dry mouth    Easy bruising    Excessive thirst     Fatigue    Frequent urination    GERD (gastroesophageal reflux disease)    Headache    Heat intolerance    Hypertension    Hypothyroidism    Joint pain    Leg pain    Muscle stiffness    Nervousness    Palpitations    Pre-diabetes    Restless leg syndrome    Shortness of breath on exertion    Sleep apnea    does not use c-pap machine   Stomach ulcer    Stress    Swelling of both lower extremities    Trouble in sleeping    Vitamin D deficiency    Weakness      Past Surgical History:  Procedure Laterality Date   APPENDECTOMY     BIOPSY  05/16/2019   Procedure: BIOPSY;  Surgeon: Rogene Houston, MD;  [facility];  Service: Endoscopy;;  duodenum antral   BLADDER SUSPENSION  12/29/2010   Procedure: TRANSVAGINAL TAPE (TVT) PROCEDURE;  Surgeon: Daria Pastures;  Location: [place] [facility];  Service: Gynecology;  Laterality: N/A;   BREAST LUMPECTOMY WITH RADIOACTIVE SEED AND SENTINEL LYMPH NODE BIOPSY Left 10/26/2021   Procedure: LEFT BREAST SEED LUMPECTOMY WITH LEFT SENTINEL LYMPH NODE MAPPING;  Surgeon: Erroll Luna, MD;  Location: [place];  Service: General;  Laterality: Left;   BREAST SURGERY     left breast lumpectomy 1977   CATARACT EXTRACTION     COLONOSCOPY N/A 03/03/2016   Procedure: COLONOSCOPY;  Surgeon: Rogene Houston, MD;  [facility];  Service: Endoscopy;  Laterality: N/A;  2:00 - moved to 12/14 @ 12:55 - Ann notified pt   CYSTOCELE REPAIR  12/29/2010   Procedure: ANTERIOR REPAIR (CYSTOCELE);  Surgeon: Daria Pastures;  Location: [facility];  Service: Gynecology;  Laterality: N/A;   CYSTOSCOPY  12/29/2010   Procedure: CYSTOSCOPY;  Surgeon: Daria Pastures;  Location: [facility] [facility];  Service: Gynecology;  Laterality: N/A;   ESOPHAGOGASTRODUODENOSCOPY N/A 05/16/2019   Procedure: ESOPHAGOGASTRODUODENOSCOPY (EGD);  Surgeon: Rogene Houston, MD;  [facility];  Service: Endoscopy;  Laterality: N/A;   JOINT REPLACEMENT  2011    rt knee   PORTACATH PLACEMENT N/A 10/26/2021   Procedure: PORT PLACEMENT WITH ULTRASOUND GUIDANCE;  Surgeon: Erroll Luna, MD;  Location: [place];  Service: General;  Laterality: N/A;   REPLACEMENT TOTAL KNEE Right 2011   TONSILLECTOMY     TOTAL KNEE ARTHROPLASTY Left 11/17/2014   Procedure: LEFT TOTAL KNEE ARTHROPLASTY;  Surgeon: Gaynelle Arabian, MD;  Location: [facility];  Service: Orthopedics;  Laterality: Left;   TUBAL LIGATION     VAGINAL HYSTERECTOMY  12/29/2010   Procedure: HYSTERECTOMY VAGINAL;  Surgeon: Daria Pastures;  Location: [facility];  Service: Gynecology;  Laterality: N/A;    Social History   Socioeconomic History   Marital status: Married    Spouse name: Jori Moll   Number of children: Not on file   Years of education: Not on file   Highest education level: Not on file  Occupational History   Occupation: Retired  Tobacco Use   Smoking status: Never   Smokeless tobacco: Never  Vaping Use   Vaping Use: Never used  Substance and Sexual Activity   Alcohol use: No   Drug use: No   Sexual activity: Yes    Birth control/protection: Post-menopausal, Surgical    Comment: HYST  Other Topics Concern   Not on file  Social History Narrative   Not on file   Social Determinants of Health   Financial Resource Strain: Low Risk  (08/10/2021)   Overall Financial Resource Strain (CARDIA)    Difficulty of Paying Living Expenses: Not very hard  Food Insecurity: No Food Insecurity (08/10/2021)   Hunger Vital Sign    Worried About Running Out of Food in the Last Year: Never true    Ran Out of Food in the Last Year: Never true  Transportation Needs: No Transportation Needs (08/10/2021)   PRAPARE - Hydrologist (Medical): No    Lack of Transportation (Non-Medical): No  Physical Activity: Insufficiently Active (08/10/2021)   Exercise Vital Sign    Days of Exercise per Week: 3 days    Minutes of Exercise per Session: 30 min  Stress: Stress  Concern Present (08/10/2021)   [place]    Feeling of Stress : To some extent  Social Connections: Socially Integrated (08/10/2021)   Social Connection and Isolation Panel [NHANES]    Frequency of Communication with Friends and Family: More than three times a week    Frequency of Social Gatherings with Friends and Family: More than three times a week    Attends Religious Services: More than 4 times per year    Active Member of [employer] or Organizations: Yes    Attends Music therapist: More than 4 times per year    Marital Status: Married  Human resources officer Violence: Not At Risk (08/10/2021)   Humiliation, Afraid, Rape, and Kick questionnaire    Fear of Current or Ex-Partner: No    Emotionally Abused: No    Physically Abused: No    Sexually Abused: No    Family History  Problem Relation Age of Onset   Stroke Mother    Hypertension Mother    Hyperlipidemia Mother    Thyroid disease Mother    Heart disease Father    COPD Father    Depression Father    Prostate cancer Father 39 - 93   Breast cancer Paternal Grandmother 65 - 11   Diabetes Paternal Grandfather    Colon cancer Cousin      Current Outpatient Medications:    ALPRAZolam (XANAX) 0.5 MG tablet, TAKE ONE TABLET BY MOUTH TWO TIMES DAILYAS NEEDED FOR ANXIETY OR SLEEP., Disp: 30 tablet, Rfl: 5   amLODipine (NORVASC) 2.5 MG tablet, Take 1 tablet (2.5 mg total) by mouth daily., Disp: 30 tablet, Rfl: 5   ARMOUR THYROID 15 MG tablet, TAKE ONE TABLET ONCE DAILY (Patient taking differently: TAKES TOTAL 45MG DAILY, TAKES 30 MG), Disp: 30 tablet, Rfl: 5   diphenoxylate-atropine (LOMOTIL) 2.5-0.025 MG tablet, Take 1 tablet by mouth 4 (four) times daily as needed for diarrhea or loose stools., Disp: 30 tablet, Rfl: 0   fluconazole (DIFLUCAN) 150 MG tablet, Take 1 tablet (150 mg total) by mouth every 3 (three) days for 3 doses., Disp: 3 tablet, Rfl: 0    lidocaine-prilocaine (EMLA) cream, Apply to affected area once, Disp: 30 g, Rfl: 3  ondansetron (ZOFRAN) 8 MG tablet, Take 1 tablet (8 mg total) by mouth every 8 (eight) hours as needed for nausea or vomiting., Disp: 30 tablet, Rfl: 1   oxybutynin (DITROPAN-XL) 5 MG 24 hr tablet, Take by mouth., Disp: , Rfl:    pantoprazole (PROTONIX) 40 MG tablet, Take 1 tablet (40 mg total) by mouth daily., Disp: 30 tablet, Rfl: 5   pregabalin (LYRICA) 50 MG capsule, 1 each evening, Disp: 30 capsule, Rfl: 5   prochlorperazine (COMPAZINE) 10 MG tablet, Take 1 tablet (10 mg total) by mouth every 6 (six) hours as needed for nausea or vomiting., Disp: 30 tablet, Rfl: 1   thyroid (ARMOUR THYROID) 30 MG tablet, TAKE ONE (1) TABLET BY MOUTH EVERY DAY, Disp: 30 tablet, Rfl: 5 No current facility-administered medications for this visit.  Facility-Administered Medications Ordered in Other Visits:    heparin lock flush 100 unit/mL, 500 Units, Intracatheter, Once PRN, Nicholas Lose, MD   PACLitaxel (TAXOL) 108 mg in sodium chloride 0.9 % 250 mL chemo infusion (</= 35m/m2), 50 mg/m2 (Treatment Plan Recorded), Intravenous, Once, GNicholas Lose MD, Last Rate: 268 mL/hr at 12/16/21 1539, 108 mg at 12/16/21 1539   sodium chloride flush (NS) 0.9 % injection 10 mL, 10 mL, Intracatheter, PRN, GNicholas Lose MD  PHYSICAL EXAM: ECOG FS:1 - Symptomatic but completely ambulatory   T: 98.1   BP: 122/65   HR: 72   Resp: 16    O2: 100% Physical Exam Vitals and nursing note reviewed.  Constitutional:      Appearance: She is well-developed. She is not ill-appearing or toxic-appearing.  HENT:     Head: Normocephalic.     Nose: Nose normal.  Eyes:     Conjunctiva/sclera: Conjunctivae normal.  Neck:     Vascular: No JVD.  Cardiovascular:     Rate and Rhythm: Normal rate and regular rhythm.     Pulses: Normal pulses.     Heart sounds: Normal heart sounds.  Pulmonary:     Effort: Pulmonary effort is normal.     Breath sounds:  Normal breath sounds.  Abdominal:     General: There is no distension.  Genitourinary:    Comments: Patient deferred exam as she is in infusion center. Musculoskeletal:     Cervical back: Normal range of motion.  Skin:    General: Skin is warm and dry.  Neurological:     Mental Status: She is oriented to person, place, and time.        LABORATORY DATA: I have reviewed the data as listed    Latest Ref Rng & Units 12/16/2021    1:02 PM 12/09/2021    8:12 AM 12/02/2021    1:34 PM  CBC  WBC 4.0 - 10.5 K/uL 4.2  2.3  7.6   Hemoglobin 12.0 - 15.0 g/dL 11.1  11.3  11.5   Hematocrit 36.0 - 46.0 % 33.9  34.9  35.4   Platelets 150 - 400 K/uL 253  226  142         Latest Ref Rng & Units 12/16/2021    1:02 PM 12/09/2021    8:12 AM 12/02/2021    1:34 PM  CMP  Glucose 70 - 99 mg/dL 112  106  155   BUN 8 - 23 mg/dL _0 Creatinine 0.44 - 1.00 mg/dL 0.92  0.77  0.82   Sodium 135 - 145 mmol/L 140  140  136   Potassium 3.5 - 5.1 mmol/L 4.3  3.9  3.7   Chloride 98 - 111 mmol/L 103  106  104   CO2 22 - 32 mmol/L _0 Calcium 8.9 - 10.3 mg/dL 8.9  9.2  9.2   Total Protein 6.5 - 8.1 g/dL 6.5  6.2  6.2   Total Bilirubin 0.3 - 1.2 mg/dL 0.3  0.5  0.7   Alkaline Phos 38 - 126 U/L 99  86  80   AST 15 - 41 U/L _1 ALT 0 - 44 U/L _2 RADIOGRAPHIC STUDIES (from last 24 hours if applicable) I have personally reviewed the radiological images as listed and agreed with the findings in the report. No results found.      Visit Diagnosis: 1. Candidiasis of anus   2. Malignant neoplasm of upper-outer quadrant of left breast in female, estrogen receptor positive ([place])      No orders of the defined types were placed in this encounter.   All questions were answered. The patient knows to call the clinic with any problems, questions or concerns. No barriers to learning was detected.  I have spent a total of 20 minutes minutes of face-to-face and  non-face-to-face time, preparing to see the patient, obtaining and/or reviewing separately obtained history, performing a medically appropriate examination, counseling and educating the patient, ordering tests, documenting clinical information in the electronic health record, and care coordination (communications with other health care professionals or caregivers).    Thank you for allowing me to participate in the care of this patient.    Barrie Folk, PA-C Department of Hematology/Oncology [facility] Phone: [phone]  [phone]    12/16/2021 4:31 PM

## 2021-12-16 NOTE — Patient Instructions (Signed)
[facility] MEDICAL [facility]COLOGY  Discharge Instructions: Thank you for choosing [facility] to provide your oncology and hematology care.   If you have a lab appointment with the Cancer Center, please go directly to the Cancer Center and check in at the registration area.   Wear comfortable clothing and clothing appropriate for easy access to any Portacath or PICC line.   We strive to give you quality time with your provider. You may need to reschedule your appointment if you arrive late (15 or more minutes).  Arriving late affects you and other patients whose appointments are after yours.  Also, if you miss three or more appointments without notifying the office, you may be dismissed from the clinic at the provider's discretion.      For prescription refill requests, have your pharmacy contact our office and allow 72 hours for refills to be completed.    Today you received the following chemotherapy and/or immunotherapy agents: Ogivri/Taxol      To help prevent nausea and vomiting after your treatment, we encourage you to take your nausea medication as directed.  BELOW ARE SYMPTOMS THAT SHOULD BE REPORTED IMMEDIATELY: *FEVER GREATER THAN 100.4 F (38 C) OR HIGHER *CHILLS OR SWEATING *NAUSEA AND VOMITING THAT IS NOT CONTROLLED WITH YOUR NAUSEA MEDICATION *UNUSUAL SHORTNESS OF BREATH *UNUSUAL BRUISING OR BLEEDING *URINARY PROBLEMS (pain or burning when urinating, or frequent urination) *BOWEL PROBLEMS (unusual diarrhea, constipation, pain near the anus) TENDERNESS IN MOUTH AND THROAT WITH OR WITHOUT PRESENCE OF ULCERS (sore throat, sores in mouth, or a toothache) UNUSUAL RASH, SWELLING OR PAIN  UNUSUAL VAGINAL DISCHARGE OR ITCHING   Items with * indicate a potential emergency and should be followed up as soon as possible or go to the Emergency Department if any problems should occur.  Please show the CHEMOTHERAPY ALERT CARD or IMMUNOTHERAPY ALERT CARD at check-in  to the Emergency Department and triage nurse.  Should you have questions after your visit or need to cancel or reschedule your appointment, please contact [facility]COL[facility]  Dept: [phone]  and follow the prompts.  Office hours are 8:00 a.m. to 4:30 p.m. Monday - Friday. Please note that voicemails left after 4:00 p.m. may not be returned until the following business day.  We are closed weekends and major holidays. You have access to a nurse at all times for urgent questions. Please call the main number to the clinic Dept: [phone] and follow the prompts.   For any non-urgent questions, you may also contact your provider using MyChart. We now offer e-Visits for anyone 18 and older to request care online for non-urgent symptoms. For details visit [email or web address].   Also download the MyChart app! Go to the app store, search "MyChart", open the app, select [facility], and log in with your MyChart username and password.  Masks are optional in the cancer centers. If you would like for your care team to wear a mask while they are taking care of you, please let them know. You may have one support person who is at least 66 years old accompany you for your appointments. 

## 2021-12-20 ENCOUNTER — Encounter: Payer: Self-pay | Admitting: Family Medicine

## 2021-12-20 NOTE — Telephone Encounter (Signed)
Nurses Please connect with [place] She is currently going through extensive treatments and visits with oncology.  It would be wise for [place] to do a visit but I certainly have plenty of laboratory, blood work, vital signs to work with.  I would recommend a virtual visit on that day we can easily do a video visit through her tablet, cell phone, MyChart  If she has conflicting appointments please let me know.  We can extend the refills until it is more convenient for her later this year but otherwise I would recommend doing a video visit on the 17th

## 2021-12-21 ENCOUNTER — Other Ambulatory Visit: Payer: Self-pay | Admitting: Hematology and Oncology

## 2021-12-21 DIAGNOSIS — C50412 Malignant neoplasm of upper-outer quadrant of left female breast: Secondary | ICD-10-CM

## 2021-12-21 NOTE — Progress Notes (Signed)
Patient Care Team: Kathyrn Drown, MD as PCP - General (Family Medicine) Rockwell Germany, RN as Oncology Nurse Navigator Mauro Kaufmann, RN as Oncology Nurse Navigator Erroll Luna, MD as Consulting Physician (General Surgery) Nicholas Lose, MD as Consulting Physician (Hematology and Oncology) Eppie Gibson, MD as Attending Physician (Radiation Oncology)  DIAGNOSIS: No diagnosis found.  SUMMARY OF ONCOLOGIC HISTORY: Oncology History  Malignant neoplasm of upper-outer quadrant of left breast in female, estrogen receptor positive ([place])  09/07/2021 Initial Diagnosis   Screening mammogram detected left breast mass by ultrasound 2 o'clock position measured 1.4 cm axilla was negative.  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 Report date is 10/04/2021.  The CustomNext-Cancer+RNAinsight panel offered by Althia Forts includes sequencing and rearrangement analysis for the following 47 genes:  APC, ATM, AXIN2, BARD1, BMPR1A, BRCA1, BRCA2, BRIP1, CDH1, CDK4, CDKN2A, CHEK2, CTNNA1, DICER1, EPCAM, GREM1, HOXB13, KIT, MEN1, MLH1, MSH2, MSH3, MSH6, MUTYH, NBN, NF1, NTHL1, PALB2, PDGFRA, PMS2, POLD1, POLE, PTEN, RAD50, RAD51C, RAD51D, SDHA, SDHB, SDHC, SDHD, SMAD4, SMARCA4, STK11, TP53, TSC1, TSC2, and VHL.  RNA data is routinely analyzed for use in variant interpretation for all genes.   11/25/2021 -  Chemotherapy   Patient is on Treatment Plan : BREAST Paclitaxel + Trastuzumab q7d / Trastuzumab q21d       CHIEF COMPLIANT: Follow-up after Left Lumpectomy  INTERVAL HISTORY: Karen Vazquez is a 66 y.o with the above  mention. She presents to the clinic today for a follow-up    ALLERGIES:  is allergic to lisinopril.  MEDICATIONS:  Current Outpatient Medications  Medication Sig Dispense Refill   ALPRAZolam (XANAX) 0.5 MG tablet TAKE ONE TABLET BY MOUTH TWO TIMES DAILYAS NEEDED FOR ANXIETY OR SLEEP. 30 tablet 5   amLODipine (NORVASC) 2.5 MG tablet Take 1 tablet (2.5 mg total) by mouth daily. 30 tablet 5   ARMOUR THYROID 15 MG tablet TAKE ONE TABLET ONCE DAILY (Patient taking differently: TAKES TOTAL 45MG DAILY, TAKES 30 MG) 30 tablet 5   diphenoxylate-atropine (LOMOTIL) 2.5-0.025 MG tablet Take 1 tablet by mouth 4 (four) times daily as needed for diarrhea or loose stools. 30 tablet 0   lidocaine-prilocaine (EMLA) cream Apply to affected area once 30 g 3   ondansetron (ZOFRAN) 8 MG tablet Take 1 tablet (8 mg total) by mouth every 8 (eight) hours as needed for nausea or vomiting. 30 tablet 1   oxybutynin (DITROPAN-XL) 5 MG 24 hr tablet Take by mouth.     pantoprazole (PROTONIX) 40 MG tablet Take 1 tablet (40 mg total) by mouth daily. 30 tablet 5   pregabalin (LYRICA) 50 MG capsule 1 each evening 30 capsule 5   prochlorperazine (COMPAZINE) 10 MG tablet Take 1 tablet (10 mg total) by mouth every 6 (six) hours as needed for nausea or vomiting. 30 tablet 1   thyroid (ARMOUR THYROID) 30 MG tablet TAKE ONE (1) TABLET BY MOUTH EVERY DAY 30 tablet 5   No current facility-administered medications for this visit.    PHYSICAL EXAMINATION: ECOG PERFORMANCE STATUS: {CHL ONC ECOG [phone]}  There were no vitals filed for this visit. There  were no vitals filed for this visit.  BREAST:*** No palpable masses or nodules in either right or left breasts. No palpable axillary supraclavicular or infraclavicular adenopathy no breast tenderness or nipple discharge. (exam performed in the presence of a chaperone)  LABORATORY DATA:  I have reviewed the data as listed    Latest Ref Rng & Units 12/16/2021    1:02 PM  12/09/2021    8:12 AM 12/02/2021    1:34 PM  CMP  Glucose 70 - 99 mg/dL 112  106  155   BUN 8 - 23 mg/dL 11  12  11    Creatinine 0.44 - 1.00 mg/dL 0.92  0.77  0.82   Sodium 135 - 145 mmol/L 140  140  136   Potassium 3.5 - 5.1 mmol/L 4.3  3.9  3.7   Chloride 98 - 111 mmol/L 103  106  104   CO2 22 - 32 mmol/L 30  30  29    Calcium 8.9 - 10.3 mg/dL 8.9  9.2  9.2   Total Protein 6.5 - 8.1 g/dL 6.5  6.2  6.2   Total Bilirubin 0.3 - 1.2 mg/dL 0.3  0.5  0.7   Alkaline Phos 38 - 126 U/L 99  86  80   AST 15 - 41 U/L 19  18  14    ALT 0 - 44 U/L 12  19  15      Lab Results  Component Value Date   WBC 4.2 12/16/2021   HGB 11.1 (L) 12/16/2021   HCT 33.9 (L) 12/16/2021   MCV 82.1 12/16/2021   PLT 253 12/16/2021   NEUTROABS 1.9 12/16/2021    ASSESSMENT & PLAN:  No problem-specific Assessment & Plan notes found for this encounter.    No orders of the defined types were placed in this encounter.  The patient has a good understanding of the overall plan. she agrees with it. she will call with any problems that may develop before the next visit here. Total time spent: 30 mins including face to face time and time spent for planning, charting and co-ordination of care   Karen Vazquez, [place] 12/21/21    I Gardiner Coins am scribing for Dr. Lindi Adie  ***

## 2021-12-22 MED FILL — Dexamethasone Sodium Phosphate Inj 100 MG/10ML: INTRAMUSCULAR | Qty: 1 | Status: AC

## 2021-12-22 MED FILL — Dexamethasone Sodium Phosphate Inj 100 MG/10ML: INTRAMUSCULAR | Qty: 1 | Status: CN

## 2021-12-23 ENCOUNTER — Inpatient Hospital Stay: Payer: Medicare Other

## 2021-12-23 ENCOUNTER — Other Ambulatory Visit: Payer: Self-pay

## 2021-12-23 ENCOUNTER — Inpatient Hospital Stay: Payer: Medicare Other | Attending: Hematology and Oncology | Admitting: Hematology and Oncology

## 2021-12-23 DIAGNOSIS — B3789 Other sites of candidiasis: Secondary | ICD-10-CM | POA: Insufficient documentation

## 2021-12-23 DIAGNOSIS — Z5189 Encounter for other specified aftercare: Secondary | ICD-10-CM | POA: Insufficient documentation

## 2021-12-23 DIAGNOSIS — Z23 Encounter for immunization: Secondary | ICD-10-CM | POA: Insufficient documentation

## 2021-12-23 DIAGNOSIS — Z79899 Other long term (current) drug therapy: Secondary | ICD-10-CM | POA: Diagnosis not present

## 2021-12-23 DIAGNOSIS — Z170 Estrogen receptor positive status [ER+]: Secondary | ICD-10-CM

## 2021-12-23 DIAGNOSIS — R5383 Other fatigue: Secondary | ICD-10-CM | POA: Diagnosis not present

## 2021-12-23 DIAGNOSIS — Z5111 Encounter for antineoplastic chemotherapy: Secondary | ICD-10-CM | POA: Diagnosis present

## 2021-12-23 DIAGNOSIS — Z79890 Hormone replacement therapy (postmenopausal): Secondary | ICD-10-CM | POA: Insufficient documentation

## 2021-12-23 DIAGNOSIS — R197 Diarrhea, unspecified: Secondary | ICD-10-CM | POA: Diagnosis not present

## 2021-12-23 DIAGNOSIS — Z95828 Presence of other vascular implants and grafts: Secondary | ICD-10-CM

## 2021-12-23 DIAGNOSIS — C50412 Malignant neoplasm of upper-outer quadrant of left female breast: Secondary | ICD-10-CM | POA: Insufficient documentation

## 2021-12-23 DIAGNOSIS — R21 Rash and other nonspecific skin eruption: Secondary | ICD-10-CM | POA: Insufficient documentation

## 2021-12-23 LAB — CBC WITH DIFFERENTIAL (CANCER CENTER ONLY)
Abs Immature Granulocytes: 0.21 10*3/uL — ABNORMAL HIGH (ref 0.00–0.07)
Basophils Absolute: 0.1 10*3/uL (ref 0.0–0.1)
Basophils Relative: 1 %
Eosinophils Absolute: 0.1 10*3/uL (ref 0.0–0.5)
Eosinophils Relative: 2 %
HCT: 34.5 % — ABNORMAL LOW (ref 36.0–46.0)
Hemoglobin: 11.1 g/dL — ABNORMAL LOW (ref 12.0–15.0)
Immature Granulocytes: 3 %
Lymphocytes Relative: 32 %
Lymphs Abs: 2.3 10*3/uL (ref 0.7–4.0)
MCH: 26.5 pg (ref 26.0–34.0)
MCHC: 32.2 g/dL (ref 30.0–36.0)
MCV: 82.3 fL (ref 80.0–100.0)
Monocytes Absolute: 0.4 10*3/uL (ref 0.1–1.0)
Monocytes Relative: 6 %
Neutro Abs: 4.1 10*3/uL (ref 1.7–7.7)
Neutrophils Relative %: 56 %
Platelet Count: 210 10*3/uL (ref 150–400)
RBC: 4.19 MIL/uL (ref 3.87–5.11)
RDW: 14.8 % (ref 11.5–15.5)
WBC Count: 7.2 10*3/uL (ref 4.0–10.5)
nRBC: 0 % (ref 0.0–0.2)

## 2021-12-23 LAB — CMP (CANCER CENTER ONLY)
ALT: 14 U/L (ref 0–44)
AST: 15 U/L (ref 15–41)
Albumin: 3.7 g/dL (ref 3.5–5.0)
Alkaline Phosphatase: 94 U/L (ref 38–126)
Anion gap: 4 — ABNORMAL LOW (ref 5–15)
BUN: 12 mg/dL (ref 8–23)
CO2: 30 mmol/L (ref 22–32)
Calcium: 8.9 mg/dL (ref 8.9–10.3)
Chloride: 107 mmol/L (ref 98–111)
Creatinine: 0.86 mg/dL (ref 0.44–1.00)
GFR, Estimated: >60 mL/min (ref >60)
Glucose, Bld: 108 mg/dL — ABNORMAL HIGH (ref 70–99)
Potassium: 3.6 mmol/L (ref 3.5–5.1)
Sodium: 141 mmol/L (ref 135–145)
Total Bilirubin: 0.4 mg/dL (ref 0.3–1.2)
Total Protein: 6.3 g/dL — ABNORMAL LOW (ref 6.5–8.1)

## 2021-12-23 MED ORDER — HEPARIN SOD (PORK) LOCK FLUSH 100 UNIT/ML IV SOLN
500.0000 [IU] | Freq: Once | INTRAVENOUS | Status: AC | PRN
  Administered 2021-12-23: 500 [IU]

## 2021-12-23 MED ORDER — FLUCONAZOLE 150 MG PO TABS: 150.0000 mg | ORAL_TABLET | ORAL | 0 refills | Status: DC

## 2021-12-23 MED ORDER — CETIRIZINE HCL 10 MG/ML IV SOLN
10.0000 mg | Freq: Once | INTRAVENOUS | Status: AC
  Administered 2021-12-23: 10 mg via INTRAVENOUS
  Filled 2021-12-23: qty 1

## 2021-12-23 MED ORDER — TRASTUZUMAB-DKST CHEMO 150 MG IV SOLR
2.0000 mg/kg | Freq: Once | INTRAVENOUS | Status: AC
  Administered 2021-12-23: 210 mg via INTRAVENOUS
  Filled 2021-12-23: qty 10

## 2021-12-23 MED ORDER — ACETAMINOPHEN 325 MG PO TABS
650.0000 mg | ORAL_TABLET | Freq: Once | ORAL | Status: AC
  Administered 2021-12-23: 650 mg via ORAL
  Filled 2021-12-23: qty 2

## 2021-12-23 MED ORDER — SODIUM CHLORIDE 0.9 % IV SOLN
10.0000 mg | Freq: Once | INTRAVENOUS | Status: AC
  Administered 2021-12-23: 10 mg via INTRAVENOUS
  Filled 2021-12-23: qty 1
  Filled 2021-12-23: qty 10

## 2021-12-23 MED ORDER — SODIUM CHLORIDE 0.9 % IV SOLN: Freq: Once | INTRAVENOUS | Status: AC

## 2021-12-23 MED ORDER — SODIUM CHLORIDE 0.9% FLUSH
10.0000 mL | INTRAVENOUS | Status: DC | PRN
  Administered 2021-12-23: 10 mL

## 2021-12-23 MED ORDER — FAMOTIDINE IN NACL 20-0.9 MG/50ML-% IV SOLN
20.0000 mg | Freq: Once | INTRAVENOUS | Status: AC
  Administered 2021-12-23: 20 mg via INTRAVENOUS
  Filled 2021-12-23: qty 50

## 2021-12-23 MED ORDER — SODIUM CHLORIDE 0.9 % IV SOLN
50.0000 mg/m2 | Freq: Once | INTRAVENOUS | Status: AC
  Administered 2021-12-23: 108 mg via INTRAVENOUS
  Filled 2021-12-23: qty 18

## 2021-12-23 MED ORDER — SODIUM CHLORIDE 0.9% FLUSH
10.0000 mL | Freq: Once | INTRAVENOUS | Status: AC
  Administered 2021-12-23: 10 mL

## 2021-12-23 NOTE — Patient Instructions (Signed)
[name]  Discharge Instructions: Thank you for choosing [facility] to provide your oncology and hematology care.   If you have a lab appointment with the Cancer Center, please go directly to the Cancer Center and check in at the registration area.   Wear comfortable clothing and clothing appropriate for easy access to any Portacath or PICC line.   We strive to give you quality time with your provider. You may need to reschedule your appointment if you arrive late (15 or more minutes).  Arriving late affects you and other patients whose appointments are after yours.  Also, if you miss three or more appointments without notifying the office, you may be dismissed from the clinic at the provider's discretion.      For prescription refill requests, have your pharmacy contact our office and allow 72 hours for refills to be completed.    Today you received the following chemotherapy and/or immunotherapy agents: Ogivri/Taxol      To help prevent nausea and vomiting after your treatment, we encourage you to take your nausea medication as directed.  BELOW ARE SYMPTOMS THAT SHOULD BE REPORTED IMMEDIATELY: *FEVER GREATER THAN 100.4 F (38 C) OR HIGHER *CHILLS OR SWEATING *NAUSEA AND VOMITING THAT IS NOT CONTROLLED WITH YOUR NAUSEA MEDICATION *UNUSUAL SHORTNESS OF BREATH *UNUSUAL BRUISING OR BLEEDING *URINARY PROBLEMS (pain or burning when urinating, or frequent urination) *BOWEL PROBLEMS (unusual diarrhea, constipation, pain near the anus) TENDERNESS IN MOUTH AND THROAT WITH OR WITHOUT PRESENCE OF ULCERS (sore throat, sores in mouth, or a toothache) UNUSUAL RASH, SWELLING OR PAIN  UNUSUAL VAGINAL DISCHARGE OR ITCHING   Items with * indicate a potential emergency and should be followed up as soon as possible or go to the Emergency Department if any problems should occur.  Please show the CHEMOTHERAPY ALERT CARD or IMMUNOTHERAPY ALERT CARD at check-in  to the Emergency Department and triage nurse.  Should you have questions after your visit or need to cancel or reschedule your appointment, please contact [facility]  Dept: [phone]  and follow the prompts.  Office hours are 8:00 a.m. to 4:30 p.m. Monday - Friday. Please note that voicemails left after 4:00 p.m. may not be returned until the following business day.  We are closed weekends and major holidays. You have access to a nurse at all times for urgent questions. Please call the main number to the clinic Dept: [phone] and follow the prompts.   For any non-urgent questions, you may also contact your provider using MyChart. We now offer e-Visits for anyone 18 and older to request care online for non-urgent symptoms. For details visit [email or web address].   Also download the MyChart app! Go to the app store, search "MyChart", open the app, select , and log in with your MyChart username and password.  Masks are optional in the cancer centers. If you would like for your care team to wear a mask while they are taking care of you, please let them know. You may have one support Karen Vazquez who is at least 66 years old accompany you for your appointments. 

## 2021-12-23 NOTE — Assessment & Plan Note (Addendum)
09/07/2021:Screening mammogram detected left breast mass by ultrasound 2 o'clock position measured 1.4 cm axilla was negative. 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  Rash: Anal candidiasis: Treated with Diflucan 2. Fatigue 3.  Slight diarrhea: Did not require any antidiarrheal measures  I gave her another prescription for Diflucan in case she gets a yeast infection again. Monitoring closely for toxicities  Return to clinic weekly for Taxol and Herceptin and every other week for follow-up with me.

## 2021-12-27 ENCOUNTER — Other Ambulatory Visit (HOSPITAL_COMMUNITY): Payer: Medicare Other

## 2021-12-27 ENCOUNTER — Ambulatory Visit (HOSPITAL_COMMUNITY)
Admission: RE | Admit: 2021-12-27 | Discharge: 2021-12-27 | Disposition: A | Discharge status: Home / Self Care | Payer: Medicare Other | Source: Ambulatory Visit | Attending: Hematology and Oncology | Admitting: Hematology and Oncology

## 2021-12-27 DIAGNOSIS — Z79899 Other long term (current) drug therapy: Secondary | ICD-10-CM | POA: Insufficient documentation

## 2021-12-27 DIAGNOSIS — Z5181 Encounter for therapeutic drug level monitoring: Secondary | ICD-10-CM | POA: Diagnosis present

## 2021-12-27 DIAGNOSIS — Z0189 Encounter for other specified special examinations: Secondary | ICD-10-CM

## 2021-12-27 DIAGNOSIS — Z170 Estrogen receptor positive status [ER+]: Secondary | ICD-10-CM | POA: Insufficient documentation

## 2021-12-27 DIAGNOSIS — C50412 Malignant neoplasm of upper-outer quadrant of left female breast: Secondary | ICD-10-CM | POA: Insufficient documentation

## 2021-12-27 LAB — ECHOCARDIOGRAM COMPLETE
AR max vel: 2.06 cm2
AV Area VTI: 2.18 cm2
AV Area mean vel: 1.95 cm2
AV Mean grad: 6 mmHg
AV Peak grad: 11.8 mmHg
Ao pk vel: 1.72 m/s
Area-P 1/2: 3.56 cm2
Calc EF: 56.2 %
MV VTI: 2.77 cm2
S' Lateral: 3.1 cm
Single Plane A2C EF: 54.1 %
Single Plane A4C EF: 59.4 %

## 2021-12-29 ENCOUNTER — Inpatient Hospital Stay: Payer: Medicare Other

## 2021-12-29 VITALS — BP 132/66 | HR 74 | Temp 98.2°F | Resp 18

## 2021-12-29 DIAGNOSIS — Z5111 Encounter for antineoplastic chemotherapy: Secondary | ICD-10-CM | POA: Diagnosis not present

## 2021-12-29 DIAGNOSIS — C50412 Malignant neoplasm of upper-outer quadrant of left female breast: Secondary | ICD-10-CM

## 2021-12-29 DIAGNOSIS — Z95828 Presence of other vascular implants and grafts: Secondary | ICD-10-CM

## 2021-12-29 LAB — CBC WITH DIFFERENTIAL (CANCER CENTER ONLY)
Abs Immature Granulocytes: 0.02 10*3/uL (ref 0.00–0.07)
Basophils Absolute: 0 10*3/uL (ref 0.0–0.1)
Basophils Relative: 0 %
Eosinophils Absolute: 0.1 10*3/uL (ref 0.0–0.5)
Eosinophils Relative: 3 %
HCT: 32.9 % — ABNORMAL LOW (ref 36.0–46.0)
Hemoglobin: 10.8 g/dL — ABNORMAL LOW (ref 12.0–15.0)
Immature Granulocytes: 0 %
Lymphocytes Relative: 38 %
Lymphs Abs: 1.8 10*3/uL (ref 0.7–4.0)
MCH: 27.1 pg (ref 26.0–34.0)
MCHC: 32.8 g/dL (ref 30.0–36.0)
MCV: 82.7 fL (ref 80.0–100.0)
Monocytes Absolute: 0.2 10*3/uL (ref 0.1–1.0)
Monocytes Relative: 4 %
Neutro Abs: 2.6 10*3/uL (ref 1.7–7.7)
Neutrophils Relative %: 55 %
Platelet Count: 155 10*3/uL (ref 150–400)
RBC: 3.98 MIL/uL (ref 3.87–5.11)
RDW: 15.6 % — ABNORMAL HIGH (ref 11.5–15.5)
WBC Count: 4.8 10*3/uL (ref 4.0–10.5)
nRBC: 0 % (ref 0.0–0.2)

## 2021-12-29 LAB — CMP (CANCER CENTER ONLY)
ALT: 15 U/L (ref 0–44)
AST: 15 U/L (ref 15–41)
Albumin: 3.7 g/dL (ref 3.5–5.0)
Alkaline Phosphatase: 85 U/L (ref 38–126)
Anion gap: 4 — ABNORMAL LOW (ref 5–15)
BUN: 12 mg/dL (ref 8–23)
CO2: 30 mmol/L (ref 22–32)
Calcium: 8.9 mg/dL (ref 8.9–10.3)
Chloride: 107 mmol/L (ref 98–111)
Creatinine: 0.73 mg/dL (ref 0.44–1.00)
GFR, Estimated: >60 mL/min (ref >60)
Glucose, Bld: 101 mg/dL — ABNORMAL HIGH (ref 70–99)
Potassium: 3.9 mmol/L (ref 3.5–5.1)
Sodium: 141 mmol/L (ref 135–145)
Total Bilirubin: 0.5 mg/dL (ref 0.3–1.2)
Total Protein: 6.1 g/dL — ABNORMAL LOW (ref 6.5–8.1)

## 2021-12-29 MED ORDER — TRASTUZUMAB-DKST CHEMO 150 MG IV SOLR
2.0000 mg/kg | Freq: Once | INTRAVENOUS | Status: AC
  Administered 2021-12-29: 210 mg via INTRAVENOUS
  Filled 2021-12-29: qty 10

## 2021-12-29 MED ORDER — CETIRIZINE HCL 10 MG/ML IV SOLN
10.0000 mg | Freq: Once | INTRAVENOUS | Status: AC
  Administered 2021-12-29: 10 mg via INTRAVENOUS
  Filled 2021-12-29: qty 1

## 2021-12-29 MED ORDER — SODIUM CHLORIDE 0.9 % IV SOLN: Freq: Once | INTRAVENOUS | Status: AC

## 2021-12-29 MED ORDER — SODIUM CHLORIDE 0.9 % IV SOLN
10.0000 mg | Freq: Once | INTRAVENOUS | Status: DC
  Filled 2021-12-29: qty 1

## 2021-12-29 MED ORDER — SODIUM CHLORIDE 0.9% FLUSH
10.0000 mL | Freq: Once | INTRAVENOUS | Status: AC
  Administered 2021-12-29: 10 mL

## 2021-12-29 MED ORDER — SODIUM CHLORIDE 0.9% FLUSH: 10.0000 mL | INTRAVENOUS | Status: DC | PRN

## 2021-12-29 MED ORDER — HEPARIN SOD (PORK) LOCK FLUSH 100 UNIT/ML IV SOLN: 500.0000 [IU] | Freq: Once | INTRAVENOUS | Status: DC | PRN

## 2021-12-29 MED ORDER — ACETAMINOPHEN 325 MG PO TABS
650.0000 mg | ORAL_TABLET | Freq: Once | ORAL | Status: AC
  Administered 2021-12-29: 650 mg via ORAL
  Filled 2021-12-29: qty 2

## 2021-12-29 MED ORDER — SODIUM CHLORIDE 0.9 % IV SOLN
50.0000 mg/m2 | Freq: Once | INTRAVENOUS | Status: AC
  Administered 2021-12-29: 108 mg via INTRAVENOUS
  Filled 2021-12-29: qty 18

## 2021-12-29 MED ORDER — SODIUM CHLORIDE 0.9 % IV SOLN
10.0000 mg | Freq: Once | INTRAVENOUS | Status: AC
  Administered 2021-12-29: 10 mg via INTRAVENOUS
  Filled 2021-12-29: qty 10

## 2021-12-29 MED ORDER — FAMOTIDINE IN NACL 20-0.9 MG/50ML-% IV SOLN
20.0000 mg | Freq: Once | INTRAVENOUS | Status: AC
  Administered 2021-12-29: 20 mg via INTRAVENOUS
  Filled 2021-12-29: qty 50

## 2021-12-29 NOTE — Patient Instructions (Signed)
[facility]  Discharge Instructions: Thank you for choosing [facility] to provide your oncology and hematology care.   If you have a lab appointment with the [facility], please go directly to the [facility] and check in at the registration area.   Wear comfortable clothing and clothing appropriate for easy access to any Portacath or PICC line.   We strive to give you quality time with your provider. You may need to reschedule your appointment if you arrive late (15 or more minutes).  Arriving late affects you and other patients whose appointments are after yours.  Also, if you miss three or more appointments without notifying the office, you may be dismissed from the clinic at the provider's discretion.      For prescription refill requests, have your pharmacy contact our office and allow 72 hours for refills to be completed.    Today you received the following chemotherapy and/or immunotherapy agents paclitaxel, herceptin      To help prevent nausea and vomiting after your treatment, we encourage you to take your nausea medication as directed.  BELOW ARE SYMPTOMS THAT SHOULD BE REPORTED IMMEDIATELY: *FEVER GREATER THAN 100.4 F (38 C) OR HIGHER *CHILLS OR SWEATING *NAUSEA AND VOMITING THAT IS NOT CONTROLLED WITH YOUR NAUSEA MEDICATION *UNUSUAL SHORTNESS OF BREATH *UNUSUAL BRUISING OR BLEEDING *URINARY PROBLEMS (pain or burning when urinating, or frequent urination) *BOWEL PROBLEMS (unusual diarrhea, constipation, pain near the anus) TENDERNESS IN MOUTH AND THROAT WITH OR WITHOUT PRESENCE OF ULCERS (sore throat, sores in mouth, or a toothache) UNUSUAL RASH, SWELLING OR PAIN  UNUSUAL VAGINAL DISCHARGE OR ITCHING   Items with * indicate a potential emergency and should be followed up as soon as possible or go to the Emergency Department if any problems should occur.  Please show the CHEMOTHERAPY ALERT CARD or IMMUNOTHERAPY ALERT CARD at  check-in to the Emergency Department and triage nurse.  Should you have questions after your visit or need to cancel or reschedule your appointment, please contact [facility]  Dept: [phone]  and follow the prompts.  Office hours are 8:00 a.m. to 4:30 p.m. Monday - Friday. Please note that voicemails left after 4:00 p.m. may not be returned until the following business day.  We are closed weekends and major holidays. You have access to a nurse at all times for urgent questions. Please call the main number to the clinic Dept: [phone] and follow the prompts.   For any non-urgent questions, you may also contact your provider using MyChart. We now offer e-Visits for anyone 10 and older to request care online for non-urgent symptoms. For details visit [email or web address].   Also download the MyChart app! Go to the app store, search "MyChart", open the app, select [facility], and log in with your MyChart username and password.  Masks are optional in the cancer centers. If you would like for your care team to wear a mask while they are taking care of you, please let them know. You may have one support person who is at least 66 years old accompany you for your appointments.

## 2021-12-29 NOTE — Progress Notes (Signed)
0.9% Sodium Chloride 250 mL, Lot: [id number], Exp: 04/2023 used for Trastuzumab dilution.   Raul Del [place], [place], BCPS, BCOP 12/29/2021 10:34 AM

## 2022-01-03 NOTE — Progress Notes (Signed)
Patient Care Team: Kathyrn Drown, MD as PCP - General (Family Medicine) Rockwell Germany, RN as Oncology Nurse Navigator Mauro Kaufmann, RN as Oncology Nurse Navigator Erroll Luna, MD as Consulting Physician (General Surgery) Nicholas Lose, MD as Consulting Physician (Hematology and Oncology) Eppie Gibson, MD as Attending Physician (Radiation Oncology)  DIAGNOSIS: No diagnosis found.  SUMMARY OF ONCOLOGIC HISTORY: Oncology History  Malignant neoplasm of upper-outer quadrant of left breast in female, estrogen receptor positive ([place])  09/07/2021 Initial Diagnosis   Screening mammogram detected left breast mass by ultrasound 2 o'clock position measured 1.4 cm axilla was negative.  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 Report date is 10/04/2021.  The CustomNext-Cancer+RNAinsight panel offered by [facility] includes sequencing and rearrangement analysis for the following 47 genes:  APC, ATM, AXIN2, BARD1, BMPR1A, BRCA1, BRCA2, BRIP1, CDH1, CDK4, CDKN2A, CHEK2, CTNNA1, DICER1, EPCAM, GREM1, HOXB13, KIT, MEN1, MLH1, MSH2, MSH3, MSH6, MUTYH, NBN, NF1, NTHL1, PALB2, PDGFRA, PMS2, POLD1, POLE, PTEN, RAD50, RAD51C, RAD51D, SDHA, SDHB, SDHC, SDHD, SMAD4, SMARCA4, STK11, TP53, TSC1, TSC2, and VHL.  RNA data is routinely analyzed for use in variant interpretation for all genes.   11/25/2021 -  Chemotherapy   Patient is on Treatment Plan : BREAST Paclitaxel + Trastuzumab q7d / Trastuzumab q21d       CHIEF COMPLIANT: Follow-up after Left Lumpectomy toxicity check  INTERVAL HISTORY: Karen Vazquez is a 66 y.o with  the above mention. She presents to the clinic today for a follow-up and toxicity check   ALLERGIES:  is allergic to lisinopril.  MEDICATIONS:  Current Outpatient Medications  Medication Sig Dispense Refill   ALPRAZolam (XANAX) 0.5 MG tablet TAKE ONE TABLET BY MOUTH TWO TIMES DAILYAS NEEDED FOR ANXIETY OR SLEEP. 30 tablet 5   amLODipine (NORVASC) 2.5 MG tablet Take 1 tablet (2.5 mg total) by mouth daily. 30 tablet 5   ARMOUR THYROID 15 MG tablet TAKE ONE TABLET ONCE DAILY (Patient taking differently: TAKES TOTAL 45MG DAILY, TAKES 30 MG) 30 tablet 5   diphenoxylate-atropine (LOMOTIL) 2.5-0.025 MG tablet Take 1 tablet by mouth 4 (four) times daily as needed for diarrhea or loose stools. 30 tablet 0   fluconazole (DIFLUCAN) 150 MG tablet Take 1 tablet (150 mg total) by mouth every 3 (three) days. 15 tablet 0   lidocaine-prilocaine (EMLA) cream Apply to affected area once 30 g 3   ondansetron (ZOFRAN) 8 MG tablet Take 1 tablet (8 mg total) by mouth every 8 (eight) hours as needed for nausea or vomiting. 30 tablet 1   oxybutynin (DITROPAN-XL) 5 MG 24 hr tablet Take by mouth.     pantoprazole (PROTONIX) 40 MG tablet Take 1 tablet (40 mg total) by mouth daily. 30 tablet 5   pregabalin (LYRICA) 50 MG capsule 1 each evening 30 capsule 5   prochlorperazine (COMPAZINE) 10 MG tablet Take 1 tablet (10 mg total) by mouth every 6 (six) hours as needed for nausea or vomiting. 30 tablet 1   thyroid (ARMOUR THYROID) 30 MG tablet TAKE ONE (1) TABLET BY MOUTH EVERY DAY 30 tablet 5   No current facility-administered  medications for this visit.    PHYSICAL EXAMINATION: ECOG PERFORMANCE STATUS: {CHL ONC ECOG [phone]}  There were no vitals filed for this visit. There were no vitals filed for this visit.  BREAST:*** No palpable masses or nodules in either right or left breasts. No palpable axillary supraclavicular or infraclavicular adenopathy no breast tenderness or nipple discharge. (exam performed in  the presence of a chaperone)  LABORATORY DATA:  I have reviewed the data as listed    Latest Ref Rng & Units 12/29/2021    8:43 AM 12/23/2021    8:15 AM 12/16/2021    1:02 PM  CMP  Glucose 70 - 99 mg/dL 101  108  112   BUN 8 - 23 mg/dL 12  12  11    Creatinine 0.44 - 1.00 mg/dL 0.73  0.86  0.92   Sodium 135 - 145 mmol/L 141  141  140   Potassium 3.5 - 5.1 mmol/L 3.9  3.6  4.3   Chloride 98 - 111 mmol/L 107  107  103   CO2 22 - 32 mmol/L 30  30  30    Calcium 8.9 - 10.3 mg/dL 8.9  8.9  8.9   Total Protein 6.5 - 8.1 g/dL 6.1  6.3  6.5   Total Bilirubin 0.3 - 1.2 mg/dL 0.5  0.4  0.3   Alkaline Phos 38 - 126 U/L 85  94  99   AST 15 - 41 U/L 15  15  19    ALT 0 - 44 U/L 15  14  12      Lab Results  Component Value Date   WBC 4.8 12/29/2021   HGB 10.8 (L) 12/29/2021   HCT 32.9 (L) 12/29/2021   MCV 82.7 12/29/2021   PLT 155 12/29/2021   NEUTROABS 2.6 12/29/2021    ASSESSMENT & PLAN:  No problem-specific Assessment & Plan notes found for this encounter.    No orders of the defined types were placed in this encounter.  The patient has a good understanding of the overall plan. she agrees with it. she will call with any problems that may develop before the next visit here. Total time spent: 30 mins including face to face time and time spent for planning, charting and co-ordination of care   Suzzette Righter, [place] 01/03/22    I Gardiner Coins am scribing for Dr. Lindi Adie  ***

## 2022-01-04 ENCOUNTER — Encounter: Payer: Self-pay | Admitting: Hematology and Oncology

## 2022-01-04 ENCOUNTER — Telehealth (INDEPENDENT_AMBULATORY_CARE_PROVIDER_SITE_OTHER): Payer: Medicare Other | Admitting: Family Medicine

## 2022-01-04 DIAGNOSIS — G4700 Insomnia, unspecified: Secondary | ICD-10-CM | POA: Diagnosis not present

## 2022-01-04 DIAGNOSIS — E038 Other specified hypothyroidism: Secondary | ICD-10-CM

## 2022-01-04 DIAGNOSIS — I10 Essential (primary) hypertension: Secondary | ICD-10-CM

## 2022-01-04 MED ORDER — PANTOPRAZOLE SODIUM 40 MG PO TBEC: 40.0000 mg | DELAYED_RELEASE_TABLET | Freq: Every day | ORAL | 5 refills | Status: DC

## 2022-01-04 MED ORDER — ARMOUR THYROID 15 MG PO TABS: ORAL_TABLET | ORAL | 5 refills | Status: DC

## 2022-01-04 MED ORDER — THYROID 30 MG PO TABS: ORAL_TABLET | ORAL | 5 refills | Status: DC

## 2022-01-04 MED ORDER — AMLODIPINE BESYLATE 2.5 MG PO TABS: 2.5000 mg | ORAL_TABLET | Freq: Every day | ORAL | 5 refills | Status: DC

## 2022-01-04 MED ORDER — PREGABALIN 50 MG PO CAPS: ORAL_CAPSULE | ORAL | 5 refills | Status: DC

## 2022-01-04 MED ORDER — ALPRAZOLAM 0.5 MG PO TABS: ORAL_TABLET | ORAL | 5 refills | Status: DC

## 2022-01-04 MED FILL — Dexamethasone Sodium Phosphate Inj 100 MG/10ML: INTRAMUSCULAR | Qty: 1 | Status: AC

## 2022-01-04 NOTE — Progress Notes (Signed)
   Subjective:    Patient ID: Karen Vazquez, female    DOB: 05/11/1955, 66 y.o.   MRN: [id number]  HPI Virtual Visit via Video Note  I connected with Karen Vazquez on 20/94/70 at  8:40 AM EDT by a video enabled telemedicine application and verified that I am speaking with the correct person using two identifiers.  Location: Patient: Home Provider: Office   I discussed the limitations of evaluation and management by telemedicine and the availability of in person appointments. The patient expressed understanding and agreed to proceed.  History of Present Illness: Patient has underlying history of insomnia she uses alprazolam at nighttime to help her with sleep She also has thyroid issues takes her medication on a regular basis but has not had any testing recently She is under the care of oncology for breast cancer and is being treated with multiple rounds of treatment Video visit necessary    Observations/Objective:   Assessment and Plan:   Follow Up Instructions:    I discussed the assessment and treatment plan with the patient. The patient was provided an opportunity to ask questions and all were answered. The patient agreed with the plan and demonstrated an understanding of the instructions.   The patient was advised to call back or seek an in-person evaluation if the symptoms worsen or if the condition fails to improve as anticipated.  I provided 20 minutes of non-face-to-face time during this encounter.   Sallee Lange, MD    Review of Systems     Objective:   Physical Exam  Today's visit was via telephone Physical exam was not possible for this visit       Assessment & Plan:  1. Essential hypertension Blood pressure good control continue current measures patient gets labs through oncology medicines were refilled  2. Other specified hypothyroidism Refill medicine We will requested oncology does thyroid function in the near future  3. Insomnia,  unspecified type She uses a Xanax at nighttime this is reasonable giving everything she is going through  Follow-up 6 months  Will inquire with oncology regarding flu vaccine and pneumococcal 20

## 2022-01-05 ENCOUNTER — Other Ambulatory Visit: Payer: Self-pay

## 2022-01-05 ENCOUNTER — Inpatient Hospital Stay: Payer: Medicare Other

## 2022-01-05 ENCOUNTER — Encounter: Payer: Self-pay | Admitting: *Deleted

## 2022-01-05 ENCOUNTER — Inpatient Hospital Stay (HOSPITAL_BASED_OUTPATIENT_CLINIC_OR_DEPARTMENT_OTHER): Payer: Medicare Other | Admitting: Hematology and Oncology

## 2022-01-05 VITALS — BP 150/73 | HR 81 | Temp 98.2°F | Resp 18

## 2022-01-05 VITALS — BP 126/75 | HR 84 | Temp 97.5°F | Resp 18 | Ht 64.0 in | Wt 232.8 lb

## 2022-01-05 DIAGNOSIS — C50412 Malignant neoplasm of upper-outer quadrant of left female breast: Secondary | ICD-10-CM

## 2022-01-05 DIAGNOSIS — E039 Hypothyroidism, unspecified: Secondary | ICD-10-CM

## 2022-01-05 DIAGNOSIS — Z170 Estrogen receptor positive status [ER+]: Secondary | ICD-10-CM

## 2022-01-05 DIAGNOSIS — Z95828 Presence of other vascular implants and grafts: Secondary | ICD-10-CM

## 2022-01-05 DIAGNOSIS — Z5111 Encounter for antineoplastic chemotherapy: Secondary | ICD-10-CM | POA: Diagnosis not present

## 2022-01-05 DIAGNOSIS — E079 Disorder of thyroid, unspecified: Secondary | ICD-10-CM

## 2022-01-05 DIAGNOSIS — Z23 Encounter for immunization: Secondary | ICD-10-CM

## 2022-01-05 LAB — CBC WITH DIFFERENTIAL (CANCER CENTER ONLY)
Abs Immature Granulocytes: 0.02 10*3/uL (ref 0.00–0.07)
Basophils Absolute: 0 10*3/uL (ref 0.0–0.1)
Basophils Relative: 1 %
Eosinophils Absolute: 0.1 10*3/uL (ref 0.0–0.5)
Eosinophils Relative: 3 %
HCT: 35 % — ABNORMAL LOW (ref 36.0–46.0)
Hemoglobin: 11.5 g/dL — ABNORMAL LOW (ref 12.0–15.0)
Immature Granulocytes: 1 %
Lymphocytes Relative: 44 %
Lymphs Abs: 1.8 10*3/uL (ref 0.7–4.0)
MCH: 27.4 pg (ref 26.0–34.0)
MCHC: 32.9 g/dL (ref 30.0–36.0)
MCV: 83.3 fL (ref 80.0–100.0)
Monocytes Absolute: 0.3 10*3/uL (ref 0.1–1.0)
Monocytes Relative: 6 %
Neutro Abs: 1.9 10*3/uL (ref 1.7–7.7)
Neutrophils Relative %: 45 %
Platelet Count: 172 10*3/uL (ref 150–400)
RBC: 4.2 MIL/uL (ref 3.87–5.11)
RDW: 16.6 % — ABNORMAL HIGH (ref 11.5–15.5)
WBC Count: 4.2 10*3/uL (ref 4.0–10.5)
nRBC: 0 % (ref 0.0–0.2)

## 2022-01-05 LAB — TSH: TSH: 1.893 u[IU]/mL (ref 0.350–4.500)

## 2022-01-05 LAB — CMP (CANCER CENTER ONLY)
ALT: 17 U/L (ref 0–44)
AST: 20 U/L (ref 15–41)
Albumin: 3.8 g/dL (ref 3.5–5.0)
Alkaline Phosphatase: 93 U/L (ref 38–126)
Anion gap: 4 — ABNORMAL LOW (ref 5–15)
BUN: 13 mg/dL (ref 8–23)
CO2: 29 mmol/L (ref 22–32)
Calcium: 9.1 mg/dL (ref 8.9–10.3)
Chloride: 107 mmol/L (ref 98–111)
Creatinine: 0.78 mg/dL (ref 0.44–1.00)
GFR, Estimated: >60 mL/min (ref >60)
Glucose, Bld: 103 mg/dL — ABNORMAL HIGH (ref 70–99)
Potassium: 3.9 mmol/L (ref 3.5–5.1)
Sodium: 140 mmol/L (ref 135–145)
Total Bilirubin: 0.5 mg/dL (ref 0.3–1.2)
Total Protein: 6.1 g/dL — ABNORMAL LOW (ref 6.5–8.1)

## 2022-01-05 LAB — T4, FREE: Free T4: 0.83 ng/dL (ref 0.61–1.12)

## 2022-01-05 MED ORDER — CETIRIZINE HCL 10 MG/ML IV SOLN
10.0000 mg | Freq: Once | INTRAVENOUS | Status: AC
  Administered 2022-01-05: 10 mg via INTRAVENOUS
  Filled 2022-01-05: qty 1

## 2022-01-05 MED ORDER — SODIUM CHLORIDE 0.9% FLUSH
10.0000 mL | Freq: Once | INTRAVENOUS | Status: AC
  Administered 2022-01-05: 10 mL

## 2022-01-05 MED ORDER — ACETAMINOPHEN 325 MG PO TABS
650.0000 mg | ORAL_TABLET | Freq: Once | ORAL | Status: AC
  Administered 2022-01-05: 650 mg via ORAL
  Filled 2022-01-05: qty 2

## 2022-01-05 MED ORDER — SODIUM CHLORIDE 0.9 % IV SOLN: Freq: Once | INTRAVENOUS | Status: AC

## 2022-01-05 MED ORDER — FAMOTIDINE IN NACL 20-0.9 MG/50ML-% IV SOLN
20.0000 mg | Freq: Once | INTRAVENOUS | Status: AC
  Administered 2022-01-05: 20 mg via INTRAVENOUS
  Filled 2022-01-05: qty 50

## 2022-01-05 MED ORDER — SODIUM CHLORIDE 0.9 % IV SOLN
50.0000 mg/m2 | Freq: Once | INTRAVENOUS | Status: AC
  Administered 2022-01-05: 108 mg via INTRAVENOUS
  Filled 2022-01-05: qty 18

## 2022-01-05 MED ORDER — HEPARIN SOD (PORK) LOCK FLUSH 100 UNIT/ML IV SOLN
500.0000 [IU] | Freq: Once | INTRAVENOUS | Status: AC | PRN
  Administered 2022-01-05: 500 [IU]

## 2022-01-05 MED ORDER — SODIUM CHLORIDE 0.9% FLUSH
10.0000 mL | INTRAVENOUS | Status: DC | PRN
  Administered 2022-01-05: 10 mL

## 2022-01-05 MED ORDER — DIPHENOXYLATE-ATROPINE 2.5-0.025 MG PO TABS: 1.0000 | ORAL_TABLET | Freq: Four times a day (QID) | ORAL | 1 refills | Status: DC | PRN

## 2022-01-05 MED ORDER — INFLUENZA VAC A&B SA ADJ QUAD 0.5 ML IM PRSY
0.5000 mL | PREFILLED_SYRINGE | Freq: Once | INTRAMUSCULAR | Status: AC
  Administered 2022-01-05: 0.5 mL via INTRAMUSCULAR
  Filled 2022-01-05: qty 0.5

## 2022-01-05 MED ORDER — SODIUM CHLORIDE 0.9 % IV SOLN
10.0000 mg | Freq: Once | INTRAVENOUS | Status: AC
  Administered 2022-01-05: 10 mg via INTRAVENOUS
  Filled 2022-01-05: qty 10

## 2022-01-05 MED ORDER — TRASTUZUMAB-DKST CHEMO 150 MG IV SOLR
2.0000 mg/kg | Freq: Once | INTRAVENOUS | Status: AC
  Administered 2022-01-05: 210 mg via INTRAVENOUS
  Filled 2022-01-05: qty 10

## 2022-01-05 NOTE — Patient Instructions (Signed)
[facility]  Discharge Instructions: Thank you for choosing [facility] to provide your oncology and hematology care.   If you have a lab appointment with the Cancer Center, please go directly to the Cancer Center and check in at the registration area.   Wear comfortable clothing and clothing appropriate for easy access to any Portacath or PICC line.   We strive to give you quality time with your provider. You may need to reschedule your appointment if you arrive late (15 or more minutes).  Arriving late affects you and other patients whose appointments are after yours.  Also, if you miss three or more appointments without notifying the office, you may be dismissed from the clinic at the provider's discretion.      For prescription refill requests, have your pharmacy contact our office and allow 72 hours for refills to be completed.    Today you received the following chemotherapy and/or immunotherapy agents: Trastuzumab and Paclitaxel      To help prevent nausea and vomiting after your treatment, we encourage you to take your nausea medication as directed.  BELOW ARE SYMPTOMS THAT SHOULD BE REPORTED IMMEDIATELY: *FEVER GREATER THAN 100.4 F (38 C) OR HIGHER *CHILLS OR SWEATING *NAUSEA AND VOMITING THAT IS NOT CONTROLLED WITH YOUR NAUSEA MEDICATION *UNUSUAL SHORTNESS OF BREATH *UNUSUAL BRUISING OR BLEEDING *URINARY PROBLEMS (pain or burning when urinating, or frequent urination) *BOWEL PROBLEMS (unusual diarrhea, constipation, pain near the anus) TENDERNESS IN MOUTH AND THROAT WITH OR WITHOUT PRESENCE OF ULCERS (sore throat, sores in mouth, or a toothache) UNUSUAL RASH, SWELLING OR PAIN  UNUSUAL VAGINAL DISCHARGE OR ITCHING   Items with * indicate a potential emergency and should be followed up as soon as possible or go to the Emergency Department if any problems should occur.  Please show the CHEMOTHERAPY ALERT CARD or IMMUNOTHERAPY ALERT  CARD at check-in to the Emergency Department and triage nurse.  Should you have questions after your visit or need to cancel or reschedule your appointment, please contact [facility]  Dept: [phone]  and follow the prompts.  Office hours are 8:00 a.m. to 4:30 p.m. Monday - Friday. Please note that voicemails left after 4:00 p.m. may not be returned until the following business day.  We are closed weekends and major holidays. You have access to a nurse at all times for urgent questions. Please call the main number to the clinic Dept: [phone] and follow the prompts.   For any non-urgent questions, you may also contact your provider using MyChart. We now offer e-Visits for anyone 18 and older to request care online for non-urgent symptoms. For details visit [email or web address].   Also download the MyChart app! Go to the app store, search "MyChart", open the app, select [facility], and log in with your MyChart username and password.  Masks are optional in the cancer centers. If you would like for your care team to wear a mask while they are taking care of you, please let them know. You may have one support person who is at least 66 years old accompany you for your appointments. 

## 2022-01-05 NOTE — Assessment & Plan Note (Signed)
09/07/2021:Screening mammogram detected left breast mass by ultrasound 2 o'clock position measured 1.4 cm axilla was negative. 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  Rash: Anal candidiasis: Treated with Diflucan 2. Fatigue 3.  Slight diarrhea: Did not require any antidiarrheal measures  I gave her another prescription for Diflucan in case she gets a yeast infection again. Monitoring closely for toxicities  Return to clinic weekly for Taxol and Herceptin and every other week for follow-up with me.

## 2022-01-06 LAB — THYROID PANEL WITH TSH
Free Thyroxine Index: 2 (ref 1.2–4.9)
T3 Uptake Ratio: 28 % (ref 24–39)
T4, Total: 7.3 ug/dL (ref 4.5–12.0)
TSH: 1.92 u[IU]/mL (ref 0.450–4.500)

## 2022-01-07 ENCOUNTER — Encounter: Payer: Self-pay | Admitting: Family Medicine

## 2022-01-10 ENCOUNTER — Telehealth: Payer: Self-pay | Admitting: Hematology and Oncology

## 2022-01-10 NOTE — Telephone Encounter (Signed)
Scheduled appointment per WQ. Patient is aware. 

## 2022-01-12 MED FILL — Dexamethasone Sodium Phosphate Inj 100 MG/10ML: INTRAMUSCULAR | Qty: 1 | Status: AC

## 2022-01-12 NOTE — Progress Notes (Signed)
Patient Care Team: Kathyrn Drown, MD as PCP - General (Family Medicine) Rockwell Germany, RN as Oncology Nurse Navigator Mauro Kaufmann, RN as Oncology Nurse Navigator Erroll Luna, MD as Consulting Physician (General Surgery) Nicholas Lose, MD as Consulting Physician (Hematology and Oncology) Eppie Gibson, MD as Attending Physician (Radiation Oncology)  DIAGNOSIS: No diagnosis found.  SUMMARY OF ONCOLOGIC HISTORY: Oncology History  Malignant neoplasm of upper-outer quadrant of left breast in female, estrogen receptor positive ([place])  09/07/2021 Initial Diagnosis   Screening mammogram detected left breast mass by ultrasound 2 o'clock position measured 1.4 cm axilla was negative.  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 Report date is 10/04/2021.  The CustomNext-Cancer+RNAinsight panel offered by [facility] includes sequencing and rearrangement analysis for the following 47 genes:  APC, ATM, AXIN2, BARD1, BMPR1A, BRCA1, BRCA2, BRIP1, CDH1, CDK4, CDKN2A, CHEK2, CTNNA1, DICER1, EPCAM, GREM1, HOXB13, KIT, MEN1, MLH1, MSH2, MSH3, MSH6, MUTYH, NBN, NF1, NTHL1, PALB2, PDGFRA, PMS2, POLD1, POLE, PTEN, RAD50, RAD51C, RAD51D, SDHA, SDHB, SDHC, SDHD, SMAD4, SMARCA4, STK11, TP53, TSC1, TSC2, and VHL.  RNA data is routinely analyzed for use in variant interpretation for all genes.   11/25/2021 -  Chemotherapy   Patient is on Treatment Plan : BREAST Paclitaxel + Trastuzumab q7d / Trastuzumab q21d       CHIEF COMPLIANT:  Follow-up after Left Lumpectomy   INTERVAL HISTORY: Karen Vazquez is a 66 y.o with the above  mentioned left lumpectomy currently on taxol. She presents to the clinic today for a follow-up.    ALLERGIES:  is allergic to lisinopril.  MEDICATIONS:  Current Outpatient Medications  Medication Sig Dispense Refill   ALPRAZolam (XANAX) 0.5 MG tablet TAKE ONE TABLET BY MOUTH TWO TIMES DAILYAS NEEDED FOR ANXIETY OR SLEEP. 30 tablet 5   amLODipine (NORVASC) 2.5 MG tablet Take 1 tablet (2.5 mg total) by mouth daily. 30 tablet 5   ARMOUR THYROID 15 MG tablet TAKE ONE TABLET ONCE DAILY 30 tablet 5   clobetasol ointment (TEMOVATE) 0.05 % SMARTSIG:sparingly Topical Twice Daily     diphenoxylate-atropine (LOMOTIL) 2.5-0.025 MG tablet Take 1 tablet by mouth 4 (four) times daily as needed for diarrhea or loose stools. 30 tablet 1   doxycycline (VIBRAMYCIN) 100 MG capsule Take 100 mg by mouth 2 (two) times daily.     fluconazole (DIFLUCAN) 150 MG tablet Take 1 tablet (150 mg total) by mouth every 3 (three) days. 15 tablet 0   lidocaine-prilocaine (EMLA) cream Apply to affected area once 30 g 3   ondansetron (ZOFRAN) 8 MG tablet Take 1 tablet (8 mg total) by mouth every 8 (eight) hours as needed for nausea or vomiting. 30 tablet 1   oxybutynin (DITROPAN-XL) 5 MG 24 hr tablet Take by mouth.     pantoprazole (PROTONIX) 40 MG tablet Take 1 tablet (40 mg total) by mouth daily. 30 tablet 5   pregabalin (LYRICA) 50 MG capsule 1 each evening 30 capsule 5   prochlorperazine (COMPAZINE) 10 MG tablet Take 1 tablet (10 mg total) by mouth every 6 (six) hours as needed for nausea or vomiting. 30 tablet 1  thyroid (ARMOUR THYROID) 30 MG tablet TAKE ONE (1) TABLET BY MOUTH EVERY DAY 30 tablet 5   No current facility-administered medications for this visit.    PHYSICAL EXAMINATION: ECOG PERFORMANCE STATUS: {CHL ONC ECOG [phone]}  There were no vitals filed for this visit. There were no vitals filed for this visit.  BREAST:*** No palpable masses or nodules in either right or left breasts. No palpable  axillary supraclavicular or infraclavicular adenopathy no breast tenderness or nipple discharge. (exam performed in the presence of a chaperone)  LABORATORY DATA:  I have reviewed the data as listed    Latest Ref Rng & Units 01/05/2022    9:01 AM 12/29/2021    8:43 AM 12/23/2021    8:15 AM  CMP  Glucose 70 - 99 mg/dL 103  101  108   BUN 8 - 23 mg/dL 13  12  12    Creatinine 0.44 - 1.00 mg/dL 0.78  0.73  0.86   Sodium 135 - 145 mmol/L 140  141  141   Potassium 3.5 - 5.1 mmol/L 3.9  3.9  3.6   Chloride 98 - 111 mmol/L 107  107  107   CO2 22 - 32 mmol/L 29  30  30    Calcium 8.9 - 10.3 mg/dL 9.1  8.9  8.9   Total Protein 6.5 - 8.1 g/dL 6.1  6.1  6.3   Total Bilirubin 0.3 - 1.2 mg/dL 0.5  0.5  0.4   Alkaline Phos 38 - 126 U/L 93  85  94   AST 15 - 41 U/L 20  15  15    ALT 0 - 44 U/L 17  15  14      Lab Results  Component Value Date   WBC 4.2 01/05/2022   HGB 11.5 (L) 01/05/2022   HCT 35.0 (L) 01/05/2022   MCV 83.3 01/05/2022   PLT 172 01/05/2022   NEUTROABS 1.9 01/05/2022    ASSESSMENT & PLAN:  No problem-specific Assessment & Plan notes found for this encounter.    No orders of the defined types were placed in this encounter.  The patient has a good understanding of the overall plan. she agrees with it. she will call with any problems that may develop before the next visit here. Total time spent: 30 mins including face to face time and time spent for planning, charting and co-ordination of care   Suzzette Righter, [place] 01/12/22    I Gardiner Coins am scribing for Dr. Lindi Adie  ***

## 2022-01-13 ENCOUNTER — Inpatient Hospital Stay: Payer: Medicare Other

## 2022-01-13 ENCOUNTER — Inpatient Hospital Stay (HOSPITAL_BASED_OUTPATIENT_CLINIC_OR_DEPARTMENT_OTHER): Payer: Medicare Other | Admitting: Hematology and Oncology

## 2022-01-13 DIAGNOSIS — Z170 Estrogen receptor positive status [ER+]: Secondary | ICD-10-CM

## 2022-01-13 DIAGNOSIS — C50412 Malignant neoplasm of upper-outer quadrant of left female breast: Secondary | ICD-10-CM | POA: Diagnosis not present

## 2022-01-13 DIAGNOSIS — Z95828 Presence of other vascular implants and grafts: Secondary | ICD-10-CM

## 2022-01-13 DIAGNOSIS — Z5111 Encounter for antineoplastic chemotherapy: Secondary | ICD-10-CM | POA: Diagnosis not present

## 2022-01-13 LAB — CMP (CANCER CENTER ONLY)
ALT: 12 U/L (ref 0–44)
AST: 13 U/L — ABNORMAL LOW (ref 15–41)
Albumin: 3.6 g/dL (ref 3.5–5.0)
Alkaline Phosphatase: 79 U/L (ref 38–126)
Anion gap: 5 (ref 5–15)
BUN: 8 mg/dL (ref 8–23)
CO2: 29 mmol/L (ref 22–32)
Calcium: 9.3 mg/dL (ref 8.9–10.3)
Chloride: 106 mmol/L (ref 98–111)
Creatinine: 0.74 mg/dL (ref 0.44–1.00)
GFR, Estimated: >60 mL/min (ref >60)
Glucose, Bld: 89 mg/dL (ref 70–99)
Potassium: 4.1 mmol/L (ref 3.5–5.1)
Sodium: 140 mmol/L (ref 135–145)
Total Bilirubin: 0.4 mg/dL (ref 0.3–1.2)
Total Protein: 6.2 g/dL — ABNORMAL LOW (ref 6.5–8.1)

## 2022-01-13 LAB — CBC WITH DIFFERENTIAL (CANCER CENTER ONLY)
Abs Immature Granulocytes: 0.03 10*3/uL (ref 0.00–0.07)
Basophils Absolute: 0 10*3/uL (ref 0.0–0.1)
Basophils Relative: 0 %
Eosinophils Absolute: 0.1 10*3/uL (ref 0.0–0.5)
Eosinophils Relative: 2 %
HCT: 33.2 % — ABNORMAL LOW (ref 36.0–46.0)
Hemoglobin: 10.8 g/dL — ABNORMAL LOW (ref 12.0–15.0)
Immature Granulocytes: 1 %
Lymphocytes Relative: 37 %
Lymphs Abs: 1.6 10*3/uL (ref 0.7–4.0)
MCH: 27.4 pg (ref 26.0–34.0)
MCHC: 32.5 g/dL (ref 30.0–36.0)
MCV: 84.3 fL (ref 80.0–100.0)
Monocytes Absolute: 0.4 10*3/uL (ref 0.1–1.0)
Monocytes Relative: 9 %
Neutro Abs: 2.2 10*3/uL (ref 1.7–7.7)
Neutrophils Relative %: 51 %
Platelet Count: 212 10*3/uL (ref 150–400)
RBC: 3.94 MIL/uL (ref 3.87–5.11)
RDW: 17.1 % — ABNORMAL HIGH (ref 11.5–15.5)
WBC Count: 4.4 10*3/uL (ref 4.0–10.5)
nRBC: 0 % (ref 0.0–0.2)

## 2022-01-13 MED ORDER — SODIUM CHLORIDE 0.9% FLUSH
10.0000 mL | Freq: Once | INTRAVENOUS | Status: AC
  Administered 2022-01-13: 10 mL

## 2022-01-13 MED ORDER — CETIRIZINE HCL 10 MG/ML IV SOLN
10.0000 mg | Freq: Once | INTRAVENOUS | Status: AC
  Administered 2022-01-13: 10 mg via INTRAVENOUS
  Filled 2022-01-13: qty 1

## 2022-01-13 MED ORDER — ACETAMINOPHEN 325 MG PO TABS
650.0000 mg | ORAL_TABLET | Freq: Once | ORAL | Status: AC
  Administered 2022-01-13: 650 mg via ORAL
  Filled 2022-01-13: qty 2

## 2022-01-13 MED ORDER — SODIUM CHLORIDE 0.9 % IV SOLN
50.0000 mg/m2 | Freq: Once | INTRAVENOUS | Status: AC
  Administered 2022-01-13: 108 mg via INTRAVENOUS
  Filled 2022-01-13: qty 18

## 2022-01-13 MED ORDER — HEPARIN SOD (PORK) LOCK FLUSH 100 UNIT/ML IV SOLN
500.0000 [IU] | Freq: Once | INTRAVENOUS | Status: AC | PRN
  Administered 2022-01-13: 500 [IU]

## 2022-01-13 MED ORDER — TRASTUZUMAB-DKST CHEMO 150 MG IV SOLR
2.0000 mg/kg | Freq: Once | INTRAVENOUS | Status: AC
  Administered 2022-01-13: 210 mg via INTRAVENOUS
  Filled 2022-01-13: qty 10

## 2022-01-13 MED ORDER — FAMOTIDINE IN NACL 20-0.9 MG/50ML-% IV SOLN
20.0000 mg | Freq: Once | INTRAVENOUS | Status: AC
  Administered 2022-01-13: 20 mg via INTRAVENOUS
  Filled 2022-01-13: qty 50

## 2022-01-13 MED ORDER — SODIUM CHLORIDE 0.9% FLUSH
10.0000 mL | INTRAVENOUS | Status: DC | PRN
  Administered 2022-01-13: 10 mL

## 2022-01-13 MED ORDER — SODIUM CHLORIDE 0.9 % IV SOLN: Freq: Once | INTRAVENOUS | Status: AC

## 2022-01-13 MED ORDER — SODIUM CHLORIDE 0.9 % IV SOLN
10.0000 mg | Freq: Once | INTRAVENOUS | Status: AC
  Administered 2022-01-13: 10 mg via INTRAVENOUS
  Filled 2022-01-13: qty 10

## 2022-01-13 NOTE — Patient Instructions (Signed)
[facility]COL[facility]  Discharge Instructions: Thank you for choosing [facility] to provide your oncology and hematology care.   If you have a lab appointment with the [facility], please go directly to the [facility] and check in at the registration area.   Wear comfortable clothing and clothing appropriate for easy access to any Portacath or PICC line.   We strive to give you quality time with your provider. You may need to reschedule your appointment if you arrive late (15 or more minutes).  Arriving late affects you and other patients whose appointments are after yours.  Also, if you miss three or more appointments without notifying the office, you may be dismissed from the clinic at the provider's discretion.      For prescription refill requests, have your pharmacy contact our office and allow 72 hours for refills to be completed.    Today you received the following chemotherapy and/or immunotherapy agents: Trastuzumab and Paclitaxel      To help prevent nausea and vomiting after your treatment, we encourage you to take your nausea medication as directed.  BELOW ARE SYMPTOMS THAT SHOULD BE REPORTED IMMEDIATELY: *FEVER GREATER THAN 100.4 F (38 C) OR HIGHER *CHILLS OR SWEATING *NAUSEA AND VOMITING THAT IS NOT CONTROLLED WITH YOUR NAUSEA MEDICATION *UNUSUAL SHORTNESS OF BREATH *UNUSUAL BRUISING OR BLEEDING *URINARY PROBLEMS (pain or burning when urinating, or frequent urination) *BOWEL PROBLEMS (unusual diarrhea, constipation, pain near the anus) TENDERNESS IN MOUTH AND THROAT WITH OR WITHOUT PRESENCE OF ULCERS (sore throat, sores in mouth, or a toothache) UNUSUAL RASH, SWELLING OR PAIN  UNUSUAL VAGINAL DISCHARGE OR ITCHING   Items with * indicate a potential emergency and should be followed up as soon as possible or go to the Emergency Department if any problems should occur.  Please show the CHEMOTHERAPY ALERT CARD or IMMUNOTHERAPY ALERT  CARD at check-in to the Emergency Department and triage nurse.  Should you have questions after your visit or need to cancel or reschedule your appointment, please contact [facility]  Dept: [phone]  and follow the prompts.  Office hours are 8:00 a.m. to 4:30 p.m. Monday - Friday. Please note that voicemails left after 4:00 p.m. may not be returned until the following business day.  We are closed weekends and major holidays. You have access to a nurse at all times for urgent questions. Please call the main number to the clinic Dept: [phone] and follow the prompts.   For any non-urgent questions, you may also contact your provider using MyChart. We now offer e-Visits for anyone 63 and older to request care online for non-urgent symptoms. For details visit [email or web address].   Also download the MyChart app! Go to the app store, search "MyChart", open the app, select [facility], and log in with your MyChart username and password.  Masks are optional in the cancer centers. If you would like for your care team to wear a mask while they are taking care of you, please let them know. You may have one support person who is at least 66 years old accompany you for your appointments.

## 2022-01-13 NOTE — Assessment & Plan Note (Signed)
09/07/2021:Screening mammogram detected left breast mass by ultrasound 2 o'clock position measured 1.4 cm axilla was negative. 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, margins negative, 0/3 lymph nodes negative, ER 90%, PR 0%, HER2 3+ positive, Ki-67 55%  Treatment plan: 1.adjuvant chemotherapy with Taxol Herceptin weekly x12 followed by Herceptin maintenance every 3 weeks for 1 year 2.Adjuvant radiation therapy 3.Adjuvant antiestrogen therapy ----------------------------------------------------------------------------------------------------------------------------- Current treatment:Cycle7day 1 Taxol Herceptin Chemotoxicities: 1.Rash: Anal candidiasis: Treated with Diflucan, improved significantly 2.Fatigue 3.Slight diarrhea: Responded to Lomotil Monitoring closely for neuropathy.  She is using ice which appears to be helping her. I gave her another prescription for Lomotil as needed Monitoring closely for toxicities  Return to clinicweekly for Taxol and Herceptin and every other week for follow-up with me.

## 2022-01-18 MED FILL — Dexamethasone Sodium Phosphate Inj 100 MG/10ML: INTRAMUSCULAR | Qty: 1 | Status: AC

## 2022-01-19 ENCOUNTER — Inpatient Hospital Stay: Payer: Medicare Other

## 2022-01-19 ENCOUNTER — Inpatient Hospital Stay: Payer: Medicare Other | Attending: Hematology and Oncology

## 2022-01-19 ENCOUNTER — Inpatient Hospital Stay (HOSPITAL_BASED_OUTPATIENT_CLINIC_OR_DEPARTMENT_OTHER): Payer: Medicare Other | Admitting: Adult Health

## 2022-01-19 ENCOUNTER — Encounter: Payer: Self-pay | Admitting: Adult Health

## 2022-01-19 VITALS — BP 149/89 | HR 79 | Temp 98.1°F | Resp 18

## 2022-01-19 DIAGNOSIS — R5383 Other fatigue: Secondary | ICD-10-CM | POA: Diagnosis not present

## 2022-01-19 DIAGNOSIS — Z79899 Other long term (current) drug therapy: Secondary | ICD-10-CM | POA: Diagnosis not present

## 2022-01-19 DIAGNOSIS — K219 Gastro-esophageal reflux disease without esophagitis: Secondary | ICD-10-CM | POA: Insufficient documentation

## 2022-01-19 DIAGNOSIS — M1990 Unspecified osteoarthritis, unspecified site: Secondary | ICD-10-CM | POA: Insufficient documentation

## 2022-01-19 DIAGNOSIS — C50412 Malignant neoplasm of upper-outer quadrant of left female breast: Secondary | ICD-10-CM | POA: Insufficient documentation

## 2022-01-19 DIAGNOSIS — Z170 Estrogen receptor positive status [ER+]: Secondary | ICD-10-CM | POA: Diagnosis not present

## 2022-01-19 DIAGNOSIS — E039 Hypothyroidism, unspecified: Secondary | ICD-10-CM | POA: Insufficient documentation

## 2022-01-19 DIAGNOSIS — E1136 Type 2 diabetes mellitus with diabetic cataract: Secondary | ICD-10-CM | POA: Diagnosis not present

## 2022-01-19 DIAGNOSIS — Z5111 Encounter for antineoplastic chemotherapy: Secondary | ICD-10-CM | POA: Insufficient documentation

## 2022-01-19 DIAGNOSIS — G4730 Sleep apnea, unspecified: Secondary | ICD-10-CM | POA: Diagnosis not present

## 2022-01-19 DIAGNOSIS — I10 Essential (primary) hypertension: Secondary | ICD-10-CM | POA: Diagnosis not present

## 2022-01-19 DIAGNOSIS — G2581 Restless legs syndrome: Secondary | ICD-10-CM | POA: Diagnosis not present

## 2022-01-19 DIAGNOSIS — Z8042 Family history of malignant neoplasm of prostate: Secondary | ICD-10-CM | POA: Insufficient documentation

## 2022-01-19 DIAGNOSIS — Z79890 Hormone replacement therapy (postmenopausal): Secondary | ICD-10-CM | POA: Insufficient documentation

## 2022-01-19 DIAGNOSIS — Z95828 Presence of other vascular implants and grafts: Secondary | ICD-10-CM

## 2022-01-19 LAB — CMP (CANCER CENTER ONLY)
ALT: 15 U/L (ref 0–44)
AST: 18 U/L (ref 15–41)
Albumin: 3.7 g/dL (ref 3.5–5.0)
Alkaline Phosphatase: 83 U/L (ref 38–126)
Anion gap: 6 (ref 5–15)
BUN: 12 mg/dL (ref 8–23)
CO2: 29 mmol/L (ref 22–32)
Calcium: 9.1 mg/dL (ref 8.9–10.3)
Chloride: 106 mmol/L (ref 98–111)
Creatinine: 0.86 mg/dL (ref 0.44–1.00)
GFR, Estimated: >60 mL/min (ref >60)
Glucose, Bld: 109 mg/dL — ABNORMAL HIGH (ref 70–99)
Potassium: 3.5 mmol/L (ref 3.5–5.1)
Sodium: 141 mmol/L (ref 135–145)
Total Bilirubin: 0.4 mg/dL (ref 0.3–1.2)
Total Protein: 6.4 g/dL — ABNORMAL LOW (ref 6.5–8.1)

## 2022-01-19 LAB — CBC WITH DIFFERENTIAL (CANCER CENTER ONLY)
Abs Immature Granulocytes: 0.06 10*3/uL (ref 0.00–0.07)
Basophils Absolute: 0 10*3/uL (ref 0.0–0.1)
Basophils Relative: 0 %
Eosinophils Absolute: 0.1 10*3/uL (ref 0.0–0.5)
Eosinophils Relative: 3 %
HCT: 33.8 % — ABNORMAL LOW (ref 36.0–46.0)
Hemoglobin: 11.1 g/dL — ABNORMAL LOW (ref 12.0–15.0)
Immature Granulocytes: 1 %
Lymphocytes Relative: 39 %
Lymphs Abs: 1.8 10*3/uL (ref 0.7–4.0)
MCH: 27.5 pg (ref 26.0–34.0)
MCHC: 32.8 g/dL (ref 30.0–36.0)
MCV: 83.7 fL (ref 80.0–100.0)
Monocytes Absolute: 0.3 10*3/uL (ref 0.1–1.0)
Monocytes Relative: 6 %
Neutro Abs: 2.3 10*3/uL (ref 1.7–7.7)
Neutrophils Relative %: 51 %
Platelet Count: 236 10*3/uL (ref 150–400)
RBC: 4.04 MIL/uL (ref 3.87–5.11)
RDW: 16.6 % — ABNORMAL HIGH (ref 11.5–15.5)
WBC Count: 4.5 10*3/uL (ref 4.0–10.5)
nRBC: 0 % (ref 0.0–0.2)

## 2022-01-19 MED ORDER — FAMOTIDINE IN NACL 20-0.9 MG/50ML-% IV SOLN
20.0000 mg | Freq: Once | INTRAVENOUS | Status: AC
  Administered 2022-01-19: 20 mg via INTRAVENOUS
  Filled 2022-01-19: qty 50

## 2022-01-19 MED ORDER — SODIUM CHLORIDE 0.9 % IV SOLN
10.0000 mg | Freq: Once | INTRAVENOUS | Status: AC
  Administered 2022-01-19: 10 mg via INTRAVENOUS
  Filled 2022-01-19: qty 10

## 2022-01-19 MED ORDER — ACETAMINOPHEN 325 MG PO TABS
650.0000 mg | ORAL_TABLET | Freq: Once | ORAL | Status: AC
  Administered 2022-01-19: 650 mg via ORAL
  Filled 2022-01-19: qty 2

## 2022-01-19 MED ORDER — HEPARIN SOD (PORK) LOCK FLUSH 100 UNIT/ML IV SOLN
500.0000 [IU] | Freq: Once | INTRAVENOUS | Status: AC | PRN
  Administered 2022-01-19: 500 [IU]

## 2022-01-19 MED ORDER — TRASTUZUMAB-DKST CHEMO 150 MG IV SOLR
2.0000 mg/kg | Freq: Once | INTRAVENOUS | Status: AC
  Administered 2022-01-19: 210 mg via INTRAVENOUS
  Filled 2022-01-19: qty 10

## 2022-01-19 MED ORDER — SODIUM CHLORIDE 0.9% FLUSH
10.0000 mL | Freq: Once | INTRAVENOUS | Status: AC
  Administered 2022-01-19: 10 mL

## 2022-01-19 MED ORDER — CETIRIZINE HCL 10 MG/ML IV SOLN
10.0000 mg | Freq: Once | INTRAVENOUS | Status: AC
  Administered 2022-01-19: 10 mg via INTRAVENOUS
  Filled 2022-01-19: qty 1

## 2022-01-19 MED ORDER — SODIUM CHLORIDE 0.9 % IV SOLN: Freq: Once | INTRAVENOUS | Status: AC

## 2022-01-19 MED ORDER — SODIUM CHLORIDE 0.9% FLUSH
10.0000 mL | INTRAVENOUS | Status: DC | PRN
  Administered 2022-01-19: 10 mL

## 2022-01-19 MED ORDER — SODIUM CHLORIDE 0.9 % IV SOLN
50.0000 mg/m2 | Freq: Once | INTRAVENOUS | Status: AC
  Administered 2022-01-19: 108 mg via INTRAVENOUS
  Filled 2022-01-19: qty 18

## 2022-01-19 NOTE — Patient Instructions (Signed)
[facility] ONCOLOG[facility]  Discharge Instructions: Thank you for choosing [facility] to provide your oncology and hematology care.   If you have a lab appointment with the [facility], please go directly to the [facility] and check in at the registration area.   Wear comfortable clothing and clothing appropriate for easy access to any Portacath or PICC line.   We strive to give you quality time with your provider. You may need to reschedule your appointment if you arrive late (15 or more minutes).  Arriving late affects you and other patients whose appointments are after yours.  Also, if you miss three or more appointments without notifying the office, you may be dismissed from the clinic at the provider's discretion.      For prescription refill requests, have your pharmacy contact our office and allow 72 hours for refills to be completed.    Today you received the following chemotherapy and/or immunotherapy agents: Traztuzumab, Paclitaxel.       To help prevent nausea and vomiting after your treatment, we encourage you to take your nausea medication as directed.  BELOW ARE SYMPTOMS THAT SHOULD BE REPORTED IMMEDIATELY: *FEVER GREATER THAN 100.4 F (38 C) OR HIGHER *CHILLS OR SWEATING *NAUSEA AND VOMITING THAT IS NOT CONTROLLED WITH YOUR NAUSEA MEDICATION *UNUSUAL SHORTNESS OF BREATH *UNUSUAL BRUISING OR BLEEDING *URINARY PROBLEMS (pain or burning when urinating, or frequent urination) *BOWEL PROBLEMS (unusual diarrhea, constipation, pain near the anus) TENDERNESS IN MOUTH AND THROAT WITH OR WITHOUT PRESENCE OF ULCERS (sore throat, sores in mouth, or a toothache) UNUSUAL RASH, SWELLING OR PAIN  UNUSUAL VAGINAL DISCHARGE OR ITCHING   Items with * indicate a potential emergency and should be followed up as soon as possible or go to the Emergency Department if any problems should occur.  Please show the CHEMOTHERAPY ALERT CARD or IMMUNOTHERAPY ALERT CARD  at check-in to the Emergency Department and triage nurse.  Should you have questions after your visit or need to cancel or reschedule your appointment, please contact [facility]  Dept: [phone]  and follow the prompts.  Office hours are 8:00 a.m. to 4:30 p.m. Monday - Friday. Please note that voicemails left after 4:00 p.m. may not be returned until the following business day.  We are closed weekends and major holidays. You have access to a nurse at all times for urgent questions. Please call the main number to the clinic Dept: [phone] and follow the prompts.   For any non-urgent questions, you may also contact your provider using MyChart. We now offer e-Visits for anyone 30 and older to request care online for non-urgent symptoms. For details visit [email or web address].   Also download the MyChart app! Go to the app store, search "MyChart", open the app, select [facility], and log in with your MyChart username and password.  Masks are optional in the cancer centers. If you would like for your care team to wear a mask while they are taking care of you, please let them know. You may have one support person who is at least 66 years old accompany you for your appointments.

## 2022-01-19 NOTE — Assessment & Plan Note (Signed)
Karen Vazquez is here today for follow-up and evaluation of her stage Ia estrogen positive HER2 positive breast cancer diagnosed in June 2023 status postlumpectomy and continuing on adjuvant chemotherapy with Taxol and Herceptin.  She is doing well today.  She has no peripheral neuropathy and her labs thus far are stable.  She will proceed with chemotherapy today so long as her pending c-Met is with in infusion parameters.  We discussed her fatigue and how it is likely a cumulative effect that can happen with the treatment.  As far as her sleep goes I reviewed her premeds with her chemo and she is getting Zyrtec as a premed.  I let her know that she could take an oral Zyrtec in the evening along with plenty of water on the day she does not receive chemotherapy to see if will help with her sleep since she is no longer taking Ambien.  We reviewed her overall plan as far as when radiation starts how she will continue on Herceptin along with when we will start antiestrogen therapy.  I sent a message to our schedulers to see if we can get her scheduled through the beginning of January so that Karen Vazquez is able to plan her holidays.  Karen Vazquez will return weekly for labs and treatment and will see myself or Dr. Lindi Adie with every other treatment.

## 2022-01-19 NOTE — Progress Notes (Signed)
[facility] Follow up:    Karen Drown, MD [place] [id number]   DIAGNOSIS:  Cancer Staging  Malignant neoplasm of upper-outer quadrant of left breast in female, estrogen receptor positive ([place]) Staging form: Breast, AJCC 8th Edition - Clinical: Stage IA (cT1b, cN0, cM0, G3, ER+, PR-, HER2+) - Signed by Nicholas Lose, MD on 09/15/2021 Stage prefix: Initial diagnosis Histologic grading system: 3 grade system   SUMMARY OF ONCOLOGIC HISTORY: Oncology History  Malignant neoplasm of upper-outer quadrant of left breast in female, estrogen receptor positive ([place])  09/07/2021 Initial Diagnosis   Screening mammogram detected left breast mass by ultrasound 2 o'clock position measured 1.4 cm axilla was negative.  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 Report date is 10/04/2021.  The CustomNext-Cancer+RNAinsight panel offered by Althia Forts includes sequencing and rearrangement analysis for the following 47 genes:  APC, ATM, AXIN2, BARD1, BMPR1A, BRCA1, BRCA2, BRIP1, CDH1, CDK4, CDKN2A, CHEK2, CTNNA1, DICER1, EPCAM, GREM1, HOXB13, KIT, MEN1, MLH1, MSH2, MSH3, MSH6, MUTYH, NBN, NF1, NTHL1, PALB2, PDGFRA, PMS2, POLD1, POLE, PTEN, RAD50, RAD51C, RAD51D, SDHA, SDHB, SDHC, SDHD, SMAD4, SMARCA4, STK11, TP53, TSC1, TSC2, and VHL.  RNA data is routinely analyzed for use in variant interpretation for all genes.   11/25/2021 -  Chemotherapy   Patient is on Treatment Plan : BREAST Paclitaxel + Trastuzumab q7d / Trastuzumab q21d       CURRENT THERAPY:  Taxol/Herceptin  INTERVAL  HISTORY: Karen Vazquez 66 y.o. female returns for follow-up and evaluation prior to receiving weekly Taxol Herceptin.  The past two weeks have been tough.  She had the flu shot on chemo day and experienced increased fatigue.  She has been feeling "blah" and has decreased motivation to get up.  No peripheral neuropathy.  Denies nausea, vomiting, mucositis.  She denies any bowel changes today.  Her sleep is difficult at tnight and she has been on [place] for 10 years and is working to discontinue due to memory loss.  She is now taking xanax at night.  She wonders if she can start taking the allergy medicine that she receives with treatment since it works so well to get her relaxed.   Patient Active Problem List   Diagnosis Date Noted   Port-A-Cath in place 11/25/2021   Genetic testing 09/20/2021   Family history of prostate cancer in father 09/15/2021   Family history of breast cancer 09/15/2021   Malignant neoplasm of upper-outer quadrant of left breast in female, estrogen receptor positive ([place]) 09/09/2021   Menopausal symptom 08/10/2021   UTI (urinary tract infection) 08/10/2021   Eating disorder 04/01/2021   Lesion of vulva 12/22/2020   History of iron deficiency anemia 10/27/2020   Cataract, nuclear sclerotic, both eyes 01/10/2020   Duodenal ulcer 06/24/2019   Fracture of distal end of radius 01/22/2018   History of total knee replacement, right 06/08/2017   History of colonic polyps 04/26/2016   OA (osteoarthritis) of knee 11/17/2014   Essential hypertension 05/28/2014   Obstructive sleep apnea 05/28/2014   Anemia, iron deficiency 12/16/2013   Prediabetes 05/01/2013  GERD (gastroesophageal reflux disease) 02/06/2013   Hypothyroidism 12/12/2012   Insomnia 12/12/2012   Hyperlipidemia 12/12/2012   Osteoarthritis 12/12/2012   Restless legs syndrome 12/12/2012   S/P LASIK surgery of both eyes 03/21/1998    is allergic to lisinopril.  MEDICAL HISTORY: Past Medical History:   Diagnosis Date   Anemia    Anxiety    Arthritis    Back pain    Blood transfusion 2011   at [facility]   Bulging lumbar disc    Chest pain    Diabetes mellitus without complication (HCC)    Dry mouth    Easy bruising    Excessive thirst    Fatigue    Frequent urination    GERD (gastroesophageal reflux disease)    Headache    Heat intolerance    Hypertension    Hypothyroidism    Joint pain    Leg pain    Muscle stiffness    Nervousness    Palpitations    Pre-diabetes    Restless leg syndrome    Shortness of breath on exertion    Sleep apnea    does not use c-pap machine   Stomach ulcer    Stress    Swelling of both lower extremities    Trouble in sleeping    Vitamin D deficiency    Weakness     SURGICAL HISTORY: Past Surgical History:  Procedure Laterality Date   APPENDECTOMY     BIOPSY  05/16/2019   Procedure: BIOPSY;  Surgeon: Rogene Houston, MD;  [facility];  Service: Endoscopy;;  duodenum antral   BLADDER SUSPENSION  12/29/2010   Procedure: TRANSVAGINAL TAPE (TVT) PROCEDURE;  Surgeon: Daria Pastures;  Location: [facility];  Service: Gynecology;  Laterality: N/A;   BREAST LUMPECTOMY WITH RADIOACTIVE SEED AND SENTINEL LYMPH NODE BIOPSY Left 10/26/2021   Procedure: LEFT BREAST SEED LUMPECTOMY WITH LEFT SENTINEL LYMPH NODE MAPPING;  Surgeon: Erroll Luna, MD;  Location: [facility];  Service: General;  Laterality: Left;   BREAST SURGERY     left breast lumpectomy 1977   CATARACT EXTRACTION     COLONOSCOPY N/A 03/03/2016   Procedure: COLONOSCOPY;  Surgeon: Rogene Houston, MD;  [facility];  Service: Endoscopy;  Laterality: N/A;  2:00 - moved to 12/14 @ 12:55 - Ann notified pt   CYSTOCELE REPAIR  12/29/2010   Procedure: ANTERIOR REPAIR (CYSTOCELE);  Surgeon: Daria Pastures;  Location: [facility];  Service: Gynecology;  Laterality: N/A;   CYSTOSCOPY  12/29/2010   Procedure: CYSTOSCOPY;  Surgeon: Daria Pastures;  Location:  [facility];  Service: Gynecology;  Laterality: N/A;   ESOPHAGOGASTRODUODENOSCOPY N/A 05/16/2019   Procedure: ESOPHAGOGASTRODUODENOSCOPY (EGD);  Surgeon: Rogene Houston, MD;  [facility];  Service: Endoscopy;  Laterality: N/A;   JOINT REPLACEMENT  2011   rt knee   PORTACATH PLACEMENT N/A 10/26/2021   Procedure: PORT PLACEMENT WITH ULTRASOUND GUIDANCE;  Surgeon: Erroll Luna, MD;  Location: [facility];  Service: General;  Laterality: N/A;   REPLACEMENT TOTAL KNEE Right 2011   TONSILLECTOMY     TOTAL KNEE ARTHROPLASTY Left 11/17/2014   Procedure: LEFT TOTAL KNEE ARTHROPLASTY;  Surgeon: Gaynelle Arabian, MD;  Location: [facility];  Service: Orthopedics;  Laterality: Left;   TUBAL LIGATION     VAGINAL HYSTERECTOMY  12/29/2010   Procedure: HYSTERECTOMY VAGINAL;  Surgeon: Daria Pastures;  Location: [facility];  Service: Gynecology;  Laterality: N/A;    SOCIAL  HISTORY: Social History   Socioeconomic History   Marital status: Married    Spouse name: Jori Moll   Number of children: Not on file   Years of education: Not on file   Highest education level: Not on file  Occupational History   Occupation: Retired  Tobacco Use   Smoking status: Never   Smokeless tobacco: Never  Vaping Use   Vaping Use: Never used  Substance and Sexual Activity   Alcohol use: No   Drug use: No   Sexual activity: Yes    Birth control/protection: Post-menopausal, Surgical    Comment: HYST  Other Topics Concern   Not on file  Social History Narrative   Not on file   Social Determinants of Health   Financial Resource Strain: Low Risk  (08/10/2021)   Overall Financial Resource Strain (CARDIA)    Difficulty of Paying Living Expenses: Not very hard  Food Insecurity: No Food Insecurity (08/10/2021)   Hunger Vital Sign    Worried About Running Out of Food in the Last Year: Never true    Ran Out of Food in the Last Year: Never true  Transportation Needs: No Transportation Needs (08/10/2021)    PRAPARE - Hydrologist (Medical): No    Lack of Transportation (Non-Medical): No  Physical Activity: Insufficiently Active (08/10/2021)   Exercise Vital Sign    Days of Exercise per Week: 3 days    Minutes of Exercise per Session: 30 min  Stress: Stress Concern Present (08/10/2021)   [place]    Feeling of Stress : To some extent  Social Connections: Socially Integrated (08/10/2021)   Social Connection and Isolation Panel [NHANES]    Frequency of Communication with Friends and Family: More than three times a week    Frequency of Social Gatherings with Friends and Family: More than three times a week    Attends Religious Services: More than 4 times per year    Active Member of [employer] or Organizations: Yes    Attends Music therapist: More than 4 times per year    Marital Status: Married  Human resources officer Violence: Not At Risk (08/10/2021)   Humiliation, Afraid, Rape, and Kick questionnaire    Fear of Current or Ex-Partner: No    Emotionally Abused: No    Physically Abused: No    Sexually Abused: No    FAMILY HISTORY: Family History  Problem Relation Age of Onset   Stroke Mother    Hypertension Mother    Hyperlipidemia Mother    Thyroid disease Mother    Heart disease Father    COPD Father    Depression Father    Prostate cancer Father 64 - 53   Breast cancer Paternal Grandmother 34 - 58   Diabetes Paternal Grandfather    Colon cancer Cousin     Review of Systems  Constitutional:  Positive for fatigue. Negative for appetite change, chills, fever and unexpected weight change.  HENT:   Negative for hearing loss, lump/mass and trouble swallowing.   Eyes:  Negative for eye problems and icterus.  Respiratory:  Negative for chest tightness, cough and shortness of breath.   Cardiovascular:  Negative for chest pain, leg swelling and palpitations.  Gastrointestinal:  Negative  for abdominal distention, abdominal pain, constipation, diarrhea, nausea and vomiting.  Endocrine: Negative for hot flashes.  Genitourinary:  Negative for difficulty urinating.   Musculoskeletal:  Negative for arthralgias.  Skin:  Negative for  itching and rash.  Neurological:  Negative for dizziness, extremity weakness, headaches and numbness.  Hematological:  Negative for adenopathy. Does not bruise/bleed easily.  Psychiatric/Behavioral:  Negative for depression. The patient is not nervous/anxious.       PHYSICAL EXAMINATION  ECOG PERFORMANCE STATUS: 1 - Symptomatic but completely ambulatory  Vitals:   01/19/22 0949  BP: 132/63  Pulse: 81  Resp: 16  Temp: 97.8 F (36.6 C)  SpO2: 100%    Physical Exam Constitutional:      General: She is not in acute distress.    Appearance: Normal appearance. She is not toxic-appearing.  HENT:     Head: Normocephalic and atraumatic.  Eyes:     General: No scleral icterus. Cardiovascular:     Rate and Rhythm: Normal rate and regular rhythm.     Pulses: Normal pulses.     Heart sounds: Normal heart sounds.  Pulmonary:     Effort: Pulmonary effort is normal.     Breath sounds: Normal breath sounds.  Abdominal:     General: Abdomen is flat. Bowel sounds are normal. There is no distension.     Palpations: Abdomen is soft.     Tenderness: There is no abdominal tenderness.  Musculoskeletal:        General: No swelling.     Cervical back: Neck supple.  Lymphadenopathy:     Cervical: No cervical adenopathy.  Skin:    General: Skin is warm and dry.     Findings: No rash.  Neurological:     General: No focal deficit present.     Mental Status: She is alert.  Psychiatric:        Mood and Affect: Mood normal.        Behavior: Behavior normal.     LABORATORY DATA:  CBC    Component Value Date/Time   WBC 4.5 01/19/2022 0932   WBC 5.7 05/16/2019 0535   RBC 4.04 01/19/2022 0932   HGB 11.1 (L) 01/19/2022 0932   HGB 14.2 02/24/2021  1018   HCT 33.8 (L) 01/19/2022 0932   HCT 43.0 02/24/2021 1018   PLT 236 01/19/2022 0932   PLT 156 02/24/2021 1018   MCV 83.7 01/19/2022 0932   MCV 83 02/24/2021 1018   MCH 27.5 01/19/2022 0932   MCHC 32.8 01/19/2022 0932   RDW 16.6 (H) 01/19/2022 0932   RDW 14.2 02/24/2021 1018   LYMPHSABS 1.8 01/19/2022 0932   LYMPHSABS 1.8 02/24/2021 1018   MONOABS 0.3 01/19/2022 0932   EOSABS 0.1 01/19/2022 0932   EOSABS 0.1 02/24/2021 1018   BASOSABS 0.0 01/19/2022 0932   BASOSABS 0.0 02/24/2021 1018    CMP     Component Value Date/Time   NA 141 01/19/2022 0932   NA 143 02/24/2021 1018   K 3.5 01/19/2022 0932   CL 106 01/19/2022 0932   CO2 29 01/19/2022 0932   GLUCOSE 109 (H) 01/19/2022 0932   BUN 12 01/19/2022 0932   BUN 12 02/24/2021 1018   CREATININE 0.86 01/19/2022 0932   CREATININE 0.89 12/12/2012 0813   CALCIUM 9.1 01/19/2022 0932   PROT 6.4 (L) 01/19/2022 0932   PROT 6.8 02/24/2021 1018   ALBUMIN 3.7 01/19/2022 0932   ALBUMIN 4.6 02/24/2021 1018   AST 18 01/19/2022 0932   ALT 15 01/19/2022 0932   ALKPHOS 83 01/19/2022 0932   BILITOT 0.4 01/19/2022 0932   GFRNONAA >60 01/19/2022 0932   GFRAA 76 02/06/2020 1054         ASSESSMENT  and THERAPY PLAN:   Malignant neoplasm of upper-outer quadrant of left breast in female, estrogen receptor positive ([place]) Elizabeht is here today for follow-up and evaluation of her stage Ia estrogen positive HER2 positive breast cancer diagnosed in June 2023 status postlumpectomy and continuing on adjuvant chemotherapy with Taxol and Herceptin.  She is doing well today.  She has no peripheral neuropathy and her labs thus far are stable.  She will proceed with chemotherapy today so long as her pending c-Met is with in infusion parameters.  We discussed her fatigue and how it is likely a cumulative effect that can happen with the treatment.  As far as her sleep goes I reviewed her premeds with her chemo and she is getting Zyrtec as a premed.   I let her know that she could take an oral Zyrtec in the evening along with plenty of water on the day she does not receive chemotherapy to see if will help with her sleep since she is no longer taking Ambien.  We reviewed her overall plan as far as when radiation starts how she will continue on Herceptin along with when we will start antiestrogen therapy.  I sent a message to our schedulers to see if we can get her scheduled through the beginning of January so that Maybell is able to plan her holidays.  Amaka will return weekly for labs and treatment and will see myself or Dr. Lindi Adie with every other treatment.   All questions were answered. The patient knows to call the clinic with any problems, questions or concerns. We can certainly see the patient much sooner if necessary.  Total encounter time:20 minutes*in face-to-face visit time, chart review, lab review, care coordination, order entry, and documentation of the encounter time.    Wilber Bihari, NP 01/19/22 11:35 AM Medical Oncology and Hematology [facility] [place], [place] [address] Tel. [phone]    Fax. [phone]  *Total Encounter Time as defined by the Centers for Medicare and Medicaid Services includes, in addition to the face-to-face time of a patient visit (documented in the note above) non-face-to-face time: obtaining and reviewing outside history, ordering and reviewing medications, tests or procedures, care coordination (communications with other health care professionals or caregivers) and documentation in the medical record.

## 2022-01-25 ENCOUNTER — Telehealth: Payer: Self-pay | Admitting: *Deleted

## 2022-01-25 MED FILL — Dexamethasone Sodium Phosphate Inj 100 MG/10ML: INTRAMUSCULAR | Qty: 1 | Status: AC

## 2022-01-25 NOTE — Telephone Encounter (Signed)
Received call from pt stating Medicare has denied her claim for a Wig.  Pt states she attempted to submit a claim with her secondary insurance [employer] and was told that they do offer a one time payment of $350 but the diagnosis code submitted did not qualify for payment.  RN contacted [facility] at [phone] and provided rep with diagnosis code of C50.919 and L65.8.  Representative states those codes do not qualify pt for wig.  RN will fax order to [facility] for their team to work on insurance authorization.  Pt educated and verbalized understanding.

## 2022-01-26 ENCOUNTER — Inpatient Hospital Stay: Payer: Medicare Other

## 2022-01-26 ENCOUNTER — Encounter: Payer: Self-pay | Admitting: *Deleted

## 2022-01-26 VITALS — BP 151/72 | HR 73 | Temp 98.1°F | Resp 17 | Ht 64.0 in | Wt 231.2 lb

## 2022-01-26 DIAGNOSIS — Z95828 Presence of other vascular implants and grafts: Secondary | ICD-10-CM

## 2022-01-26 DIAGNOSIS — Z170 Estrogen receptor positive status [ER+]: Secondary | ICD-10-CM

## 2022-01-26 DIAGNOSIS — Z5111 Encounter for antineoplastic chemotherapy: Secondary | ICD-10-CM | POA: Diagnosis not present

## 2022-01-26 LAB — CMP (CANCER CENTER ONLY)
ALT: 16 U/L (ref 0–44)
AST: 18 U/L (ref 15–41)
Albumin: 3.7 g/dL (ref 3.5–5.0)
Alkaline Phosphatase: 85 U/L (ref 38–126)
Anion gap: 5 (ref 5–15)
BUN: 10 mg/dL (ref 8–23)
CO2: 29 mmol/L (ref 22–32)
Calcium: 9.1 mg/dL (ref 8.9–10.3)
Chloride: 107 mmol/L (ref 98–111)
Creatinine: 0.87 mg/dL (ref 0.44–1.00)
GFR, Estimated: >60 mL/min (ref >60)
Glucose, Bld: 97 mg/dL (ref 70–99)
Potassium: 4 mmol/L (ref 3.5–5.1)
Sodium: 141 mmol/L (ref 135–145)
Total Bilirubin: 0.5 mg/dL (ref 0.3–1.2)
Total Protein: 6.1 g/dL — ABNORMAL LOW (ref 6.5–8.1)

## 2022-01-26 LAB — CBC WITH DIFFERENTIAL (CANCER CENTER ONLY)
Abs Immature Granulocytes: 0.03 10*3/uL (ref 0.00–0.07)
Basophils Absolute: 0 10*3/uL (ref 0.0–0.1)
Basophils Relative: 1 %
Eosinophils Absolute: 0.1 10*3/uL (ref 0.0–0.5)
Eosinophils Relative: 2 %
HCT: 33.8 % — ABNORMAL LOW (ref 36.0–46.0)
Hemoglobin: 10.8 g/dL — ABNORMAL LOW (ref 12.0–15.0)
Immature Granulocytes: 1 %
Lymphocytes Relative: 37 %
Lymphs Abs: 1.6 10*3/uL (ref 0.7–4.0)
MCH: 27.2 pg (ref 26.0–34.0)
MCHC: 32 g/dL (ref 30.0–36.0)
MCV: 85.1 fL (ref 80.0–100.0)
Monocytes Absolute: 0.3 10*3/uL (ref 0.1–1.0)
Monocytes Relative: 7 %
Neutro Abs: 2.2 10*3/uL (ref 1.7–7.7)
Neutrophils Relative %: 52 %
Platelet Count: 204 10*3/uL (ref 150–400)
RBC: 3.97 MIL/uL (ref 3.87–5.11)
RDW: 17.1 % — ABNORMAL HIGH (ref 11.5–15.5)
WBC Count: 4.2 10*3/uL (ref 4.0–10.5)
nRBC: 0 % (ref 0.0–0.2)

## 2022-01-26 MED ORDER — FAMOTIDINE IN NACL 20-0.9 MG/50ML-% IV SOLN
20.0000 mg | Freq: Once | INTRAVENOUS | Status: AC
  Administered 2022-01-26: 20 mg via INTRAVENOUS
  Filled 2022-01-26: qty 50

## 2022-01-26 MED ORDER — ACETAMINOPHEN 325 MG PO TABS
650.0000 mg | ORAL_TABLET | Freq: Once | ORAL | Status: AC
  Administered 2022-01-26: 650 mg via ORAL
  Filled 2022-01-26: qty 2

## 2022-01-26 MED ORDER — SODIUM CHLORIDE 0.9 % IV SOLN: Freq: Once | INTRAVENOUS | Status: AC

## 2022-01-26 MED ORDER — CETIRIZINE HCL 10 MG/ML IV SOLN
10.0000 mg | Freq: Once | INTRAVENOUS | Status: AC
  Administered 2022-01-26: 10 mg via INTRAVENOUS
  Filled 2022-01-26: qty 1

## 2022-01-26 MED ORDER — SODIUM CHLORIDE 0.9% FLUSH
10.0000 mL | Freq: Once | INTRAVENOUS | Status: AC
  Administered 2022-01-26: 10 mL

## 2022-01-26 MED ORDER — SODIUM CHLORIDE 0.9% FLUSH
10.0000 mL | INTRAVENOUS | Status: DC | PRN
  Administered 2022-01-26: 10 mL

## 2022-01-26 MED ORDER — SODIUM CHLORIDE 0.9 % IV SOLN
10.0000 mg | Freq: Once | INTRAVENOUS | Status: AC
  Administered 2022-01-26: 10 mg via INTRAVENOUS
  Filled 2022-01-26: qty 10

## 2022-01-26 MED ORDER — SODIUM CHLORIDE 0.9 % IV SOLN
50.0000 mg/m2 | Freq: Once | INTRAVENOUS | Status: AC
  Administered 2022-01-26: 108 mg via INTRAVENOUS
  Filled 2022-01-26: qty 18

## 2022-01-26 MED ORDER — HEPARIN SOD (PORK) LOCK FLUSH 100 UNIT/ML IV SOLN
500.0000 [IU] | Freq: Once | INTRAVENOUS | Status: AC | PRN
  Administered 2022-01-26: 500 [IU]

## 2022-01-26 MED ORDER — TRASTUZUMAB-DKST CHEMO 150 MG IV SOLR
2.0000 mg/kg | Freq: Once | INTRAVENOUS | Status: AC
  Administered 2022-01-26: 210 mg via INTRAVENOUS
  Filled 2022-01-26: qty 10

## 2022-01-26 NOTE — Patient Instructions (Signed)
[facility] ONCOLOGY  Discharge Instructions: Thank you for choosing [facility] to provide your oncology and hematology care.   If you have a lab appointment with the [facility], please go directly to the [facility] and check in at the registration area.   Wear comfortable clothing and clothing appropriate for easy access to any Portacath or PICC line.   We strive to give you quality time with your provider. You may need to reschedule your appointment if you arrive late (15 or more minutes).  Arriving late affects you and other patients whose appointments are after yours.  Also, if you miss three or more appointments without notifying the office, you may be dismissed from the clinic at the provider's discretion.      For prescription refill requests, have your pharmacy contact our office and allow 72 hours for refills to be completed.    Today you received the following chemotherapy and/or immunotherapy agents: Traztuzumab, Paclitaxel.       To help prevent nausea and vomiting after your treatment, we encourage you to take your nausea medication as directed.  BELOW ARE SYMPTOMS THAT SHOULD BE REPORTED IMMEDIATELY: *FEVER GREATER THAN 100.4 F (38 C) OR HIGHER *CHILLS OR SWEATING *NAUSEA AND VOMITING THAT IS NOT CONTROLLED WITH YOUR NAUSEA MEDICATION *UNUSUAL SHORTNESS OF BREATH *UNUSUAL BRUISING OR BLEEDING *URINARY PROBLEMS (pain or burning when urinating, or frequent urination) *BOWEL PROBLEMS (unusual diarrhea, constipation, pain near the anus) TENDERNESS IN MOUTH AND THROAT WITH OR WITHOUT PRESENCE OF ULCERS (sore throat, sores in mouth, or a toothache) UNUSUAL RASH, SWELLING OR PAIN  UNUSUAL VAGINAL DISCHARGE OR ITCHING   Items with * indicate a potential emergency and should be followed up as soon as possible or go to the Emergency Department if any problems should occur.  Please show the CHEMOTHERAPY ALERT CARD or IMMUNOTHERAPY ALERT CARD  at check-in to the Emergency Department and triage nurse.  Should you have questions after your visit or need to cancel or reschedule your appointment, please contact [facility]  Dept: [phone]  and follow the prompts.  Office hours are 8:00 a.m. to 4:30 p.m. Monday - Friday. Please note that voicemails left after 4:00 p.m. may not be returned until the following business day.  We are closed weekends and major holidays. You have access to a nurse at all times for urgent questions. Please call the main number to the clinic Dept: [phone] and follow the prompts.   For any non-urgent questions, you may also contact your provider using MyChart. We now offer e-Visits for anyone 73 and older to request care online for non-urgent symptoms. For details visit [email or web address].   Also download the MyChart app! Go to the app store, search "MyChart", open the app, select [facility], and log in with your MyChart username and password.  Masks are optional in the cancer centers. If you would like for your care team to wear a mask while they are taking care of you, please let them know. You may have one support person who is at least 66 years old accompany you for your appointments.

## 2022-01-27 ENCOUNTER — Inpatient Hospital Stay: Payer: Medicare Other | Admitting: Hematology and Oncology

## 2022-01-27 ENCOUNTER — Inpatient Hospital Stay: Payer: Medicare Other

## 2022-01-30 NOTE — Progress Notes (Signed)
Patient Care Team: Kathyrn Drown, MD as PCP - General (Family Medicine) Rockwell Germany, RN as Oncology Nurse Navigator Mauro Kaufmann, RN as Oncology Nurse Navigator Erroll Luna, MD as Consulting Physician (General Surgery) Nicholas Lose, MD as Consulting Physician (Hematology and Oncology) Eppie Gibson, MD as Attending Physician (Radiation Oncology)  DIAGNOSIS: No diagnosis found.  SUMMARY OF ONCOLOGIC HISTORY: Oncology History  Malignant neoplasm of upper-outer quadrant of left breast in female, estrogen receptor positive ([place])  09/07/2021 Initial Diagnosis   Screening mammogram detected left breast mass by ultrasound 2 o'clock position measured 1.4 cm axilla was negative.  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 Report date is 10/04/2021.  The CustomNext-Cancer+RNAinsight panel offered by Althia Forts includes sequencing and rearrangement analysis for the following 47 genes:  APC, ATM, AXIN2, BARD1, BMPR1A, BRCA1, BRCA2, BRIP1, CDH1, CDK4, CDKN2A, CHEK2, CTNNA1, DICER1, EPCAM, GREM1, HOXB13, KIT, MEN1, MLH1, MSH2, MSH3, MSH6, MUTYH, NBN, NF1, NTHL1, PALB2, PDGFRA, PMS2, POLD1, POLE, PTEN, RAD50, RAD51C, RAD51D, SDHA, SDHB, SDHC, SDHD, SMAD4, SMARCA4, STK11, TP53, TSC1, TSC2, and VHL.  RNA data is routinely analyzed for use in variant interpretation for all genes.   11/25/2021 -  Chemotherapy   Patient is on Treatment Plan : BREAST Paclitaxel + Trastuzumab q7d / Trastuzumab q21d       CHIEF COMPLIANT: Follow-up after Left Lumpectomy on Taxol/Herceptin INTERVAL HISTORY: Karen Vazquez is a 66 y.o with  the above mentioned left lumpectomy currently on Taxol and herceptin. She presents to the clinic for a follow-up.    ALLERGIES:  is allergic to lisinopril.  MEDICATIONS:  Current Outpatient Medications  Medication Sig Dispense Refill   ALPRAZolam (XANAX) 0.5 MG tablet TAKE ONE TABLET BY MOUTH TWO TIMES DAILYAS NEEDED FOR ANXIETY OR SLEEP. 30 tablet 5   amLODipine (NORVASC) 2.5 MG tablet Take 1 tablet (2.5 mg total) by mouth daily. 30 tablet 5   ARMOUR THYROID 15 MG tablet TAKE ONE TABLET ONCE DAILY 30 tablet 5   clobetasol ointment (TEMOVATE) 0.05 % SMARTSIG:sparingly Topical Twice Daily     diphenoxylate-atropine (LOMOTIL) 2.5-0.025 MG tablet Take 1 tablet by mouth 4 (four) times daily as needed for diarrhea or loose stools. 30 tablet 1   doxycycline (VIBRAMYCIN) 100 MG capsule Take 100 mg by mouth 2 (two) times daily.     fluconazole (DIFLUCAN) 150 MG tablet Take 1 tablet (150 mg total) by mouth every 3 (three) days. 15 tablet 0   lidocaine-prilocaine (EMLA) cream Apply to affected area once 30 g 3   ondansetron (ZOFRAN) 8 MG tablet Take 1 tablet (8 mg total) by mouth every 8 (eight) hours as needed for nausea or vomiting. 30 tablet 1   oxybutynin (DITROPAN-XL) 5 MG 24 hr tablet Take by mouth.     pantoprazole (PROTONIX) 40 MG tablet Take 1 tablet (40 mg total) by mouth daily. 30 tablet 5   pregabalin (LYRICA) 50 MG capsule 1 each evening 30 capsule 5   prochlorperazine (COMPAZINE) 10 MG tablet Take 1 tablet (10 mg total) by mouth every 6 (six) hours as needed for nausea or vomiting. 30 tablet 1  thyroid (ARMOUR THYROID) 30 MG tablet TAKE ONE (1) TABLET BY MOUTH EVERY DAY 30 tablet 5   No current facility-administered medications for this visit.    PHYSICAL EXAMINATION: ECOG PERFORMANCE STATUS: {CHL ONC ECOG PS:[phone]}  There were no vitals filed for this visit. There were no vitals filed for this visit.  BREAST:*** No palpable masses or nodules in either right or left  breasts. No palpable axillary supraclavicular or infraclavicular adenopathy no breast tenderness or nipple discharge. (exam performed in the presence of a chaperone)  LABORATORY DATA:  I have reviewed the data as listed    Latest Ref Rng & Units 01/26/2022    8:06 AM 01/19/2022    9:32 AM 01/13/2022    9:06 AM  CMP  Glucose 70 - 99 mg/dL 97  109  89   BUN 8 - 23 mg/dL _0 Creatinine 0.44 - 1.00 mg/dL 0.87  0.86  0.74   Sodium 135 - 145 mmol/L 141  141  140   Potassium 3.5 - 5.1 mmol/L 4.0  3.5  4.1   Chloride 98 - 111 mmol/L 107  106  106   CO2 22 - 32 mmol/L _1 Calcium 8.9 - 10.3 mg/dL 9.1  9.1  9.3   Total Protein 6.5 - 8.1 g/dL 6.1  6.4  6.2   Total Bilirubin 0.3 - 1.2 mg/dL 0.5  0.4  0.4   Alkaline Phos 38 - 126 U/L 85  83  79   AST 15 - 41 U/L _2 ALT 0 - 44 U/L _3 Lab Results  Component Value Date   WBC 4.2 01/26/2022   HGB 10.8 (L) 01/26/2022   HCT 33.8 (L) 01/26/2022   MCV 85.1 01/26/2022   PLT 204 01/26/2022   NEUTROABS 2.2 01/26/2022    ASSESSMENT & PLAN:  No problem-specific Assessment & Plan notes found for this encounter.    No orders of the defined types were placed in this encounter.  The patient has a good understanding of the overall plan. she agrees with it. she will call with any problems that may develop before the next visit here. Total time spent: 30 mins including face to face time and time spent for planning, charting and co-ordination of care   Suzzette Righter, [place] 01/30/22    I Gardiner Coins am scribing for Dr. Lindi Adie  ***

## 2022-01-31 ENCOUNTER — Ambulatory Visit: Payer: Medicare Other

## 2022-02-01 MED FILL — Dexamethasone Sodium Phosphate Inj 100 MG/10ML: INTRAMUSCULAR | Qty: 1 | Status: AC

## 2022-02-01 NOTE — Assessment & Plan Note (Signed)
09/07/2021:Screening mammogram detected left breast mass by ultrasound 2 o'clock position measured 1.4 cm axilla was negative.  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 adjuvant chemotherapy with Taxol Herceptin weekly x12 followed by Herceptin maintenance every 3 weeks for 1 year 2.  Adjuvant radiation therapy 3.  Adjuvant antiestrogen therapy ----------------------------------------------------------------------------------------------------------------------------- Current treatment: Cycle 9 day 1 Taxol Herceptin Chemotoxicities: 1.  Rash: Anal candidiasis: Treated with Diflucan, improved significantly 2. Fatigue 3.  Slight diarrhea: Responded to Lomotil Monitoring closely for neuropathy.  She is using ice which appears to be helping her.   Monitoring closely for toxicities

## 2022-02-02 ENCOUNTER — Other Ambulatory Visit: Payer: Self-pay | Admitting: *Deleted

## 2022-02-02 ENCOUNTER — Inpatient Hospital Stay (HOSPITAL_BASED_OUTPATIENT_CLINIC_OR_DEPARTMENT_OTHER): Payer: Medicare Other | Admitting: Hematology and Oncology

## 2022-02-02 ENCOUNTER — Inpatient Hospital Stay: Payer: Medicare Other

## 2022-02-02 VITALS — BP 156/78

## 2022-02-02 VITALS — BP 139/85 | HR 77 | Temp 97.9°F | Resp 18 | Ht 64.0 in | Wt 234.2 lb

## 2022-02-02 DIAGNOSIS — C50412 Malignant neoplasm of upper-outer quadrant of left female breast: Secondary | ICD-10-CM

## 2022-02-02 DIAGNOSIS — Z170 Estrogen receptor positive status [ER+]: Secondary | ICD-10-CM

## 2022-02-02 DIAGNOSIS — Z95828 Presence of other vascular implants and grafts: Secondary | ICD-10-CM

## 2022-02-02 DIAGNOSIS — Z5111 Encounter for antineoplastic chemotherapy: Secondary | ICD-10-CM | POA: Diagnosis not present

## 2022-02-02 LAB — CBC WITH DIFFERENTIAL (CANCER CENTER ONLY)
Abs Immature Granulocytes: 0.03 10*3/uL (ref 0.00–0.07)
Basophils Absolute: 0 10*3/uL (ref 0.0–0.1)
Basophils Relative: 0 %
Eosinophils Absolute: 0.1 10*3/uL (ref 0.0–0.5)
Eosinophils Relative: 2 %
HCT: 32.9 % — ABNORMAL LOW (ref 36.0–46.0)
Hemoglobin: 10.7 g/dL — ABNORMAL LOW (ref 12.0–15.0)
Immature Granulocytes: 1 %
Lymphocytes Relative: 44 %
Lymphs Abs: 1.9 10*3/uL (ref 0.7–4.0)
MCH: 28.2 pg (ref 26.0–34.0)
MCHC: 32.5 g/dL (ref 30.0–36.0)
MCV: 86.6 fL (ref 80.0–100.0)
Monocytes Absolute: 0.3 10*3/uL (ref 0.1–1.0)
Monocytes Relative: 7 %
Neutro Abs: 2 10*3/uL (ref 1.7–7.7)
Neutrophils Relative %: 46 %
Platelet Count: 175 10*3/uL (ref 150–400)
RBC: 3.8 MIL/uL — ABNORMAL LOW (ref 3.87–5.11)
RDW: 16.9 % — ABNORMAL HIGH (ref 11.5–15.5)
WBC Count: 4.3 10*3/uL (ref 4.0–10.5)
nRBC: 0 % (ref 0.0–0.2)

## 2022-02-02 LAB — CMP (CANCER CENTER ONLY)
ALT: 16 U/L (ref 0–44)
AST: 17 U/L (ref 15–41)
Albumin: 3.9 g/dL (ref 3.5–5.0)
Alkaline Phosphatase: 87 U/L (ref 38–126)
Anion gap: 6 (ref 5–15)
BUN: 14 mg/dL (ref 8–23)
CO2: 28 mmol/L (ref 22–32)
Calcium: 9.3 mg/dL (ref 8.9–10.3)
Chloride: 106 mmol/L (ref 98–111)
Creatinine: 0.92 mg/dL (ref 0.44–1.00)
GFR, Estimated: >60 mL/min (ref >60)
Glucose, Bld: 100 mg/dL — ABNORMAL HIGH (ref 70–99)
Potassium: 3.9 mmol/L (ref 3.5–5.1)
Sodium: 140 mmol/L (ref 135–145)
Total Bilirubin: 0.4 mg/dL (ref 0.3–1.2)
Total Protein: 6.2 g/dL — ABNORMAL LOW (ref 6.5–8.1)

## 2022-02-02 MED ORDER — SODIUM CHLORIDE 0.9% FLUSH
10.0000 mL | Freq: Once | INTRAVENOUS | Status: AC
  Administered 2022-02-02: 10 mL

## 2022-02-02 MED ORDER — SODIUM CHLORIDE 0.9% FLUSH
10.0000 mL | INTRAVENOUS | Status: DC | PRN
  Administered 2022-02-02: 10 mL

## 2022-02-02 MED ORDER — SODIUM CHLORIDE 0.9 % IV SOLN
10.0000 mg | Freq: Once | INTRAVENOUS | Status: AC
  Administered 2022-02-02: 10 mg via INTRAVENOUS
  Filled 2022-02-02: qty 10

## 2022-02-02 MED ORDER — CETIRIZINE HCL 10 MG/ML IV SOLN
10.0000 mg | Freq: Once | INTRAVENOUS | Status: AC
  Administered 2022-02-02: 10 mg via INTRAVENOUS
  Filled 2022-02-02: qty 1

## 2022-02-02 MED ORDER — HEPARIN SOD (PORK) LOCK FLUSH 100 UNIT/ML IV SOLN
500.0000 [IU] | Freq: Once | INTRAVENOUS | Status: AC | PRN
  Administered 2022-02-02: 500 [IU]

## 2022-02-02 MED ORDER — SODIUM CHLORIDE 0.9 % IV SOLN
50.0000 mg/m2 | Freq: Once | INTRAVENOUS | Status: AC
  Administered 2022-02-02: 108 mg via INTRAVENOUS
  Filled 2022-02-02: qty 18

## 2022-02-02 MED ORDER — SODIUM CHLORIDE 0.9 % IV SOLN: Freq: Once | INTRAVENOUS | Status: AC

## 2022-02-02 MED ORDER — FAMOTIDINE IN NACL 20-0.9 MG/50ML-% IV SOLN
20.0000 mg | Freq: Once | INTRAVENOUS | Status: AC
  Administered 2022-02-02: 20 mg via INTRAVENOUS
  Filled 2022-02-02: qty 50

## 2022-02-02 MED ORDER — TRASTUZUMAB-DKST CHEMO 150 MG IV SOLR
2.0000 mg/kg | Freq: Once | INTRAVENOUS | Status: AC
  Administered 2022-02-02: 210 mg via INTRAVENOUS
  Filled 2022-02-02: qty 10

## 2022-02-02 MED ORDER — ACETAMINOPHEN 325 MG PO TABS
650.0000 mg | ORAL_TABLET | Freq: Once | ORAL | Status: AC
  Administered 2022-02-02: 650 mg via ORAL
  Filled 2022-02-02: qty 2

## 2022-02-02 NOTE — Patient Instructions (Signed)
[facility] ONCOL[facility]  Discharge Instructions: Thank you for choosing [facility] to provide your oncology and hematology care.   If you have a lab appointment with the [facility], please go directly to the [facility] and check in at the registration area.   Wear comfortable clothing and clothing appropriate for easy access to any Portacath or PICC line.   We strive to give you quality time with your provider. You may need to reschedule your appointment if you arrive late (15 or more minutes).  Arriving late affects you and other patients whose appointments are after yours.  Also, if you miss three or more appointments without notifying the office, you may be dismissed from the clinic at the provider's discretion.      For prescription refill requests, have your pharmacy contact our office and allow 72 hours for refills to be completed.    Today you received the following chemotherapy and/or immunotherapy agents: Traztuzumab, Paclitaxel.       To help prevent nausea and vomiting after your treatment, we encourage you to take your nausea medication as directed.  BELOW ARE SYMPTOMS THAT SHOULD BE REPORTED IMMEDIATELY: *FEVER GREATER THAN 100.4 F (38 C) OR HIGHER *CHILLS OR SWEATING *NAUSEA AND VOMITING THAT IS NOT CONTROLLED WITH YOUR NAUSEA MEDICATION *UNUSUAL SHORTNESS OF BREATH *UNUSUAL BRUISING OR BLEEDING *URINARY PROBLEMS (pain or burning when urinating, or frequent urination) *BOWEL PROBLEMS (unusual diarrhea, constipation, pain near the anus) TENDERNESS IN MOUTH AND THROAT WITH OR WITHOUT PRESENCE OF ULCERS (sore throat, sores in mouth, or a toothache) UNUSUAL RASH, SWELLING OR PAIN  UNUSUAL VAGINAL DISCHARGE OR ITCHING   Items with * indicate a potential emergency and should be followed up as soon as possible or go to the Emergency Department if any problems should occur.  Please show the CHEMOTHERAPY ALERT CARD or IMMUNOTHERAPY ALERT CARD  at check-in to the Emergency Department and triage nurse.  Should you have questions after your visit or need to cancel or reschedule your appointment, please contact [facility]  Dept: [phone]  and follow the prompts.  Office hours are 8:00 a.m. to 4:30 p.m. Monday - Friday. Please note that voicemails left after 4:00 p.m. may not be returned until the following business day.  We are closed weekends and major holidays. You have access to a nurse at all times for urgent questions. Please call the main number to the clinic Dept: [phone] and follow the prompts.   For any non-urgent questions, you may also contact your provider using MyChart. We now offer e-Visits for anyone 73 and older to request care online for non-urgent symptoms. For details visit [email or web address].   Also download the MyChart app! Go to the app store, search "MyChart", open the app, select [facility], and log in with your MyChart username and password.  Masks are optional in the cancer centers. If you would like for your care team to wear a mask while they are taking care of you, please let them know. You may have one support person who is at least 66 years old accompany you for your appointments.

## 2022-02-03 ENCOUNTER — Telehealth: Payer: Self-pay | Admitting: Hematology and Oncology

## 2022-02-03 NOTE — Telephone Encounter (Signed)
Scheduled appointment per 11/15 los. Patient is aware.

## 2022-02-07 ENCOUNTER — Ambulatory Visit: Payer: Medicare Other | Attending: Surgery | Admitting: Physical Therapy

## 2022-02-07 ENCOUNTER — Encounter: Payer: Self-pay | Admitting: Physical Therapy

## 2022-02-07 DIAGNOSIS — C50412 Malignant neoplasm of upper-outer quadrant of left female breast: Secondary | ICD-10-CM | POA: Insufficient documentation

## 2022-02-07 DIAGNOSIS — Z170 Estrogen receptor positive status [ER+]: Secondary | ICD-10-CM | POA: Insufficient documentation

## 2022-02-07 NOTE — Therapy (Signed)
OUTPATIENT PHYSICAL THERAPY SOZO SCREENING NOTE   Patient Name: Karen Vazquez MRN: [id number] DOB:09-30-1955, 66 y.o., female Today's Date: 02/07/2022  PCP: Kathyrn Drown, MD REFERRING PROVIDER: Erroll Luna, MD   PT End of Session - 02/07/22 1058     Visit Number 2   unchanged due to screen only   PT Start Time 45    PT Stop Time 1058    PT Time Calculation (min) 13 min    Activity Tolerance Patient tolerated treatment well    Behavior During Therapy WFL for tasks assessed/performed             Past Medical History:  Diagnosis Date   Anemia    Anxiety    Arthritis    Back pain    Blood transfusion 2011   at [facility]   Bulging lumbar disc    Chest pain    Diabetes mellitus without complication (HCC)    Dry mouth    Easy bruising    Excessive thirst    Fatigue    Frequent urination    GERD (gastroesophageal reflux disease)    Headache    Heat intolerance    Hypertension    Hypothyroidism    Joint pain    Leg pain    Muscle stiffness    Nervousness    Palpitations    Pre-diabetes    Restless leg syndrome    Shortness of breath on exertion    Sleep apnea    does not use c-pap machine   Stomach ulcer    Stress    Swelling of both lower extremities    Trouble in sleeping    Vitamin D deficiency    Weakness    Past Surgical History:  Procedure Laterality Date   APPENDECTOMY     BIOPSY  05/16/2019   Procedure: BIOPSY;  Surgeon: Rogene Houston, MD;  [facility];  Service: Endoscopy;;  duodenum antral   BLADDER SUSPENSION  12/29/2010   Procedure: TRANSVAGINAL TAPE (TVT) PROCEDURE;  Surgeon: Daria Pastures;  Location: [facility];  Service: Gynecology;  Laterality: N/A;   BREAST LUMPECTOMY WITH RADIOACTIVE SEED AND SENTINEL LYMPH NODE BIOPSY Left 10/26/2021   Procedure: LEFT BREAST SEED LUMPECTOMY WITH LEFT SENTINEL LYMPH NODE MAPPING;  Surgeon: Erroll Luna, MD;  Location: [facility];  Service: General;  Laterality: Left;    BREAST SURGERY     left breast lumpectomy 1977   CATARACT EXTRACTION     COLONOSCOPY N/A 03/03/2016   Procedure: COLONOSCOPY;  Surgeon: Rogene Houston, MD;  [facility];  Service: Endoscopy;  Laterality: N/A;  2:00 - moved to 12/14 @ 12:55 - Ann notified pt   CYSTOCELE REPAIR  12/29/2010   Procedure: ANTERIOR REPAIR (CYSTOCELE);  Surgeon: Daria Pastures;  Location: [facility];  Service: Gynecology;  Laterality: N/A;   CYSTOSCOPY  12/29/2010   Procedure: CYSTOSCOPY;  Surgeon: Daria Pastures;  Location: [facility];  Service: Gynecology;  Laterality: N/A;   ESOPHAGOGASTRODUODENOSCOPY N/A 05/16/2019   Procedure: ESOPHAGOGASTRODUODENOSCOPY (EGD);  Surgeon: Rogene Houston, MD;  [facility];  Service: Endoscopy;  Laterality: N/A;   JOINT REPLACEMENT  2011   rt knee   PORTACATH PLACEMENT N/A 10/26/2021   Procedure: PORT PLACEMENT WITH ULTRASOUND GUIDANCE;  Surgeon: Erroll Luna, MD;  Location: [facility];  Service: General;  Laterality: N/A;   REPLACEMENT TOTAL KNEE Right 2011   TONSILLECTOMY     TOTAL KNEE ARTHROPLASTY Left  11/17/2014   Procedure: LEFT TOTAL KNEE ARTHROPLASTY;  Surgeon: Gaynelle Arabian, MD;  Location: [facility];  Service: Orthopedics;  Laterality: Left;   TUBAL LIGATION     VAGINAL HYSTERECTOMY  12/29/2010   Procedure: HYSTERECTOMY VAGINAL;  Surgeon: Daria Pastures;  Location: [facility];  Service: Gynecology;  Laterality: N/A;   Patient Active Problem List   Diagnosis Date Noted   Port-A-Cath in place 11/25/2021   Genetic testing 09/20/2021   Family history of prostate cancer in father 09/15/2021   Family history of breast cancer 09/15/2021   Malignant neoplasm of upper-outer quadrant of left breast in female, estrogen receptor positive ([place]) 09/09/2021   Menopausal symptom 08/10/2021   UTI (urinary tract infection) 08/10/2021   Eating disorder 04/01/2021   Lesion of vulva 12/22/2020   History of iron deficiency anemia 10/27/2020    Cataract, nuclear sclerotic, both eyes 01/10/2020   Duodenal ulcer 06/24/2019   Fracture of distal end of radius 01/22/2018   History of total knee replacement, right 06/08/2017   History of colonic polyps 04/26/2016   OA (osteoarthritis) of knee 11/17/2014   Essential hypertension 05/28/2014   Obstructive sleep apnea 05/28/2014   Anemia, iron deficiency 12/16/2013   Prediabetes 05/01/2013   GERD (gastroesophageal reflux disease) 02/06/2013   Hypothyroidism 12/12/2012   Insomnia 12/12/2012   Hyperlipidemia 12/12/2012   Osteoarthritis 12/12/2012   Restless legs syndrome 12/12/2012   S/P LASIK surgery of both eyes 03/21/1998    REFERRING DIAG: left breast cancer at risk for lymphedema  THERAPY DIAG:  Malignant neoplasm of upper-outer quadrant of left breast in female, estrogen receptor positive ([place])  PERTINENT HISTORY: Patient was diagnosed on 08/17/2021 with left grade 2 invasive ductal carcinoma breast cancer. It is ER positive, PR negative, and HER2 positive with a Ki67 of 55%. She underwent a left lumpectomy and sentinel node biopsy (3 negative nodes) on 10/26/2021. She had a benign left breast lump removed in 1977, has anemia, hypertension, and has had both knee replaced in 2011 and 2016 (unknown when right versus left were done).   PRECAUTIONS: left UE Lymphedema risk,   SUBJECTIVE: Pt is here for 3 month SOZO screening.  PAIN:  Are you having pain? No  SOZO SCREENING: Patient was assessed today using the SOZO machine to determine the lymphedema index score. This was compared to her baseline score. It was determined that she is within the recommended range when compared to her baseline and no further action is needed at this time. She will continue SOZO screenings. These are done every 3 months for 2 years post operatively followed by every 6 months for 2 years, and then annually.      [employer], PT 02/07/2022, 10:59 AM

## 2022-02-08 MED FILL — Dexamethasone Sodium Phosphate Inj 100 MG/10ML: INTRAMUSCULAR | Qty: 1 | Status: AC

## 2022-02-09 ENCOUNTER — Encounter: Payer: Self-pay | Admitting: *Deleted

## 2022-02-09 ENCOUNTER — Inpatient Hospital Stay: Payer: Medicare Other

## 2022-02-09 VITALS — BP 144/82 | HR 75 | Temp 98.4°F | Resp 16 | Wt 233.5 lb

## 2022-02-09 DIAGNOSIS — Z170 Estrogen receptor positive status [ER+]: Secondary | ICD-10-CM

## 2022-02-09 DIAGNOSIS — Z95828 Presence of other vascular implants and grafts: Secondary | ICD-10-CM

## 2022-02-09 DIAGNOSIS — Z5111 Encounter for antineoplastic chemotherapy: Secondary | ICD-10-CM | POA: Diagnosis not present

## 2022-02-09 LAB — CMP (CANCER CENTER ONLY)
ALT: 17 U/L (ref 0–44)
AST: 18 U/L (ref 15–41)
Albumin: 3.9 g/dL (ref 3.5–5.0)
Alkaline Phosphatase: 76 U/L (ref 38–126)
Anion gap: 3 — ABNORMAL LOW (ref 5–15)
BUN: 16 mg/dL (ref 8–23)
CO2: 30 mmol/L (ref 22–32)
Calcium: 9.6 mg/dL (ref 8.9–10.3)
Chloride: 107 mmol/L (ref 98–111)
Creatinine: 0.93 mg/dL (ref 0.44–1.00)
GFR, Estimated: >60 mL/min (ref >60)
Glucose, Bld: 98 mg/dL (ref 70–99)
Potassium: 4 mmol/L (ref 3.5–5.1)
Sodium: 140 mmol/L (ref 135–145)
Total Bilirubin: 0.5 mg/dL (ref 0.3–1.2)
Total Protein: 6 g/dL — ABNORMAL LOW (ref 6.5–8.1)

## 2022-02-09 LAB — CBC WITH DIFFERENTIAL (CANCER CENTER ONLY)
Abs Immature Granulocytes: 0.05 10*3/uL (ref 0.00–0.07)
Basophils Absolute: 0 10*3/uL (ref 0.0–0.1)
Basophils Relative: 0 %
Eosinophils Absolute: 0.1 10*3/uL (ref 0.0–0.5)
Eosinophils Relative: 2 %
HCT: 33 % — ABNORMAL LOW (ref 36.0–46.0)
Hemoglobin: 10.5 g/dL — ABNORMAL LOW (ref 12.0–15.0)
Immature Granulocytes: 1 %
Lymphocytes Relative: 37 %
Lymphs Abs: 1.7 10*3/uL (ref 0.7–4.0)
MCH: 27.2 pg (ref 26.0–34.0)
MCHC: 31.8 g/dL (ref 30.0–36.0)
MCV: 85.5 fL (ref 80.0–100.0)
Monocytes Absolute: 0.4 10*3/uL (ref 0.1–1.0)
Monocytes Relative: 8 %
Neutro Abs: 2.3 10*3/uL (ref 1.7–7.7)
Neutrophils Relative %: 52 %
Platelet Count: 187 10*3/uL (ref 150–400)
RBC: 3.86 MIL/uL — ABNORMAL LOW (ref 3.87–5.11)
RDW: 17.1 % — ABNORMAL HIGH (ref 11.5–15.5)
WBC Count: 4.5 10*3/uL (ref 4.0–10.5)
nRBC: 0 % (ref 0.0–0.2)

## 2022-02-09 MED ORDER — FAMOTIDINE IN NACL 20-0.9 MG/50ML-% IV SOLN
20.0000 mg | Freq: Once | INTRAVENOUS | Status: AC
  Administered 2022-02-09: 20 mg via INTRAVENOUS
  Filled 2022-02-09: qty 50

## 2022-02-09 MED ORDER — SODIUM CHLORIDE 0.9% FLUSH
10.0000 mL | Freq: Once | INTRAVENOUS | Status: AC
  Administered 2022-02-09: 10 mL

## 2022-02-09 MED ORDER — ACETAMINOPHEN 325 MG PO TABS
650.0000 mg | ORAL_TABLET | Freq: Once | ORAL | Status: AC
  Administered 2022-02-09: 650 mg via ORAL
  Filled 2022-02-09: qty 2

## 2022-02-09 MED ORDER — TRASTUZUMAB-DKST CHEMO 150 MG IV SOLR
2.0000 mg/kg | Freq: Once | INTRAVENOUS | Status: AC
  Administered 2022-02-09: 210 mg via INTRAVENOUS
  Filled 2022-02-09: qty 10

## 2022-02-09 MED ORDER — CETIRIZINE HCL 10 MG/ML IV SOLN
10.0000 mg | Freq: Once | INTRAVENOUS | Status: AC
  Administered 2022-02-09: 10 mg via INTRAVENOUS
  Filled 2022-02-09: qty 1

## 2022-02-09 MED ORDER — SODIUM CHLORIDE 0.9 % IV SOLN
50.0000 mg/m2 | Freq: Once | INTRAVENOUS | Status: AC
  Administered 2022-02-09: 108 mg via INTRAVENOUS
  Filled 2022-02-09: qty 18

## 2022-02-09 MED ORDER — HEPARIN SOD (PORK) LOCK FLUSH 100 UNIT/ML IV SOLN
500.0000 [IU] | Freq: Once | INTRAVENOUS | Status: AC | PRN
  Administered 2022-02-09: 500 [IU]

## 2022-02-09 MED ORDER — SODIUM CHLORIDE 0.9% FLUSH
10.0000 mL | INTRAVENOUS | Status: DC | PRN
  Administered 2022-02-09: 10 mL

## 2022-02-09 MED ORDER — SODIUM CHLORIDE 0.9 % IV SOLN
10.0000 mg | Freq: Once | INTRAVENOUS | Status: AC
  Administered 2022-02-09: 10 mg via INTRAVENOUS
  Filled 2022-02-09: qty 10

## 2022-02-09 MED ORDER — SODIUM CHLORIDE 0.9 % IV SOLN: Freq: Once | INTRAVENOUS | Status: AC

## 2022-02-09 NOTE — Patient Instructions (Signed)
[facility] ONCOL[facility]  Discharge Instructions: Thank you for choosing [facility] to provide your oncology and hematology care.   If you have a lab appointment with the [facility], please go directly to the [facility] and check in at the registration area.   Wear comfortable clothing and clothing appropriate for easy access to any Portacath or PICC line.   We strive to give you quality time with your provider. You may need to reschedule your appointment if you arrive late (15 or more minutes).  Arriving late affects you and other patients whose appointments are after yours.  Also, if you miss three or more appointments without notifying the office, you may be dismissed from the clinic at the provider's discretion.      For prescription refill requests, have your pharmacy contact our office and allow 72 hours for refills to be completed.    Today you received the following chemotherapy and/or immunotherapy agents: Ogivri/Taxol      To help prevent nausea and vomiting after your treatment, we encourage you to take your nausea medication as directed.  BELOW ARE SYMPTOMS THAT SHOULD BE REPORTED IMMEDIATELY: *FEVER GREATER THAN 100.4 F (38 C) OR HIGHER *CHILLS OR SWEATING *NAUSEA AND VOMITING THAT IS NOT CONTROLLED WITH YOUR NAUSEA MEDICATION *UNUSUAL SHORTNESS OF BREATH *UNUSUAL BRUISING OR BLEEDING *URINARY PROBLEMS (pain or burning when urinating, or frequent urination) *BOWEL PROBLEMS (unusual diarrhea, constipation, pain near the anus) TENDERNESS IN MOUTH AND THROAT WITH OR WITHOUT PRESENCE OF ULCERS (sore throat, sores in mouth, or a toothache) UNUSUAL RASH, SWELLING OR PAIN  UNUSUAL VAGINAL DISCHARGE OR ITCHING   Items with * indicate a potential emergency and should be followed up as soon as possible or go to the Emergency Department if any problems should occur.  Please show the CHEMOTHERAPY ALERT CARD or IMMUNOTHERAPY ALERT CARD at check-in  to the Emergency Department and triage nurse.  Should you have questions after your visit or need to cancel or reschedule your appointment, please contact [facility]  Dept: [phone]  and follow the prompts.  Office hours are 8:00 a.m. to 4:30 p.m. Monday - Friday. Please note that voicemails left after 4:00 p.m. may not be returned until the following business day.  We are closed weekends and major holidays. You have access to a nurse at all times for urgent questions. Please call the main number to the clinic Dept: [phone] and follow the prompts.   For any non-urgent questions, you may also contact your provider using MyChart. We now offer e-Visits for anyone 34 and older to request care online for non-urgent symptoms. For details visit [email or web address].   Also download the MyChart app! Go to the app store, search "MyChart", open the app, select [facility], and log in with your MyChart username and password.  Masks are optional in the cancer centers. If you would like for your care team to wear a mask while they are taking care of you, please let them know. You may have one support person who is at least 66 years old accompany you for your appointments.

## 2022-02-13 NOTE — Progress Notes (Signed)
Patient Care Team: Kathyrn Drown, MD as PCP - General (Family Medicine) Rockwell Germany, RN as Oncology Nurse Navigator Mauro Kaufmann, RN as Oncology Nurse Navigator Erroll Luna, MD as Consulting Physician (General Surgery) Nicholas Lose, MD as Consulting Physician (Hematology and Oncology) Eppie Gibson, MD as Attending Physician (Radiation Oncology)  DIAGNOSIS:  Encounter Diagnosis  Name Primary?   Malignant neoplasm of upper-outer quadrant of left breast in female, estrogen receptor positive ([place]) Yes    SUMMARY OF ONCOLOGIC HISTORY: Oncology History  Malignant neoplasm of upper-outer quadrant of left breast in female, estrogen receptor positive ([place])  09/07/2021 Initial Diagnosis   Screening mammogram detected left breast mass by ultrasound 2 o'clock position measured 1.4 cm axilla was negative.  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 Report date is 10/04/2021.  The CustomNext-Cancer+RNAinsight panel offered by [facility] includes sequencing and rearrangement analysis for the following 47 genes:  APC, ATM, AXIN2, BARD1, BMPR1A, BRCA1, BRCA2, BRIP1, CDH1, CDK4, CDKN2A, CHEK2, CTNNA1, DICER1, EPCAM, GREM1, HOXB13, KIT, MEN1, MLH1, MSH2, MSH3, MSH6, MUTYH, NBN, NF1, NTHL1, PALB2, PDGFRA, PMS2, POLD1, POLE, PTEN, RAD50, RAD51C, RAD51D, SDHA, SDHB, SDHC, SDHD, SMAD4, SMARCA4, STK11, TP53, TSC1, TSC2, and VHL.  RNA data is routinely analyzed for use in variant interpretation for all genes.   11/25/2021 -  Chemotherapy   Patient is on Treatment Plan : BREAST Paclitaxel + Trastuzumab q7d / Trastuzumab q21d        CHIEF COMPLIANT:  Follow-up after Left Lumpectomy on Taxol/Herceptin Cycle 12  INTERVAL HISTORY: Karen Vazquez is a 66 y.o with the above mentioned left lumpectomy currently on Taxol and herceptin. She presents to the clinic for a follow-up and treatment. She denies numbness and tingling in fingers and toes. She states appetite and taste is not there and her major complaint is her bowels has been loose but it has been o.k.   ALLERGIES:  is allergic to lisinopril.  MEDICATIONS:  Current Outpatient Medications  Medication Sig Dispense Refill   ALPRAZolam (XANAX) 0.5 MG tablet TAKE ONE TABLET BY MOUTH TWO TIMES DAILYAS NEEDED FOR ANXIETY OR SLEEP. 30 tablet 5   amLODipine (NORVASC) 2.5 MG tablet Take 1 tablet (2.5 mg total) by mouth daily. 30 tablet 5   ARMOUR THYROID 15 MG tablet TAKE ONE TABLET ONCE DAILY 30 tablet 5   diphenoxylate-atropine (LOMOTIL) 2.5-0.025 MG tablet Take 1 tablet by mouth 4 (four) times daily as needed for diarrhea or loose stools. 30 tablet 1   lidocaine-prilocaine (EMLA) cream Apply to affected area once 30 g 3   oxybutynin (DITROPAN-XL) 5 MG 24 hr tablet Take by mouth.     pantoprazole (PROTONIX) 40 MG tablet Take 1 tablet (40 mg total) by mouth daily. 30 tablet 5   pregabalin (LYRICA) 50 MG capsule 1 each evening 30 capsule 5   thyroid (ARMOUR THYROID) 30 MG tablet TAKE ONE (1) TABLET BY MOUTH EVERY DAY 30 tablet 5   No current facility-administered medications for this visit.    PHYSICAL EXAMINATION: ECOG PERFORMANCE STATUS: 1 - Symptomatic but completely ambulatory  Vitals:   02/16/22 1044  BP: Marland Kitchen)  155/96  Pulse: 73  Resp: 16  Temp: 97.7 F (36.5 C)  SpO2: 100%   Filed Weights   02/16/22 1044  Weight: 233 lb 12.8 oz (106.1 kg)      LABORATORY DATA:  I have reviewed the data as listed    Latest Ref Rng & Units 02/09/2022    8:18 AM 02/02/2022    9:08 AM 01/26/2022    8:06 AM  CMP  Glucose 70 - 99 mg/dL 98  100  97   BUN 8 - 23  mg/dL _0 Creatinine 0.44 - 1.00 mg/dL 0.93  0.92  0.87   Sodium 135 - 145 mmol/L 140  140  141   Potassium 3.5 - 5.1 mmol/L 4.0  3.9  4.0   Chloride 98 - 111 mmol/L 107  106  107   CO2 22 - 32 mmol/L _1 Calcium 8.9 - 10.3 mg/dL 9.6  9.3  9.1   Total Protein 6.5 - 8.1 g/dL 6.0  6.2  6.1   Total Bilirubin 0.3 - 1.2 mg/dL 0.5  0.4  0.5   Alkaline Phos 38 - 126 U/L 76  87  85   AST 15 - 41 U/L _2 ALT 0 - 44 U/L _3 Lab Results  Component Value Date   WBC 5.1 02/16/2022   HGB 11.1 (L) 02/16/2022   HCT 34.2 (L) 02/16/2022   MCV 85.7 02/16/2022   PLT 208 02/16/2022   NEUTROABS 2.6 02/16/2022    ASSESSMENT & PLAN:  Malignant neoplasm of upper-outer quadrant of left breast in female, estrogen receptor positive ([place]) 09/07/2021:Screening mammogram detected left breast mass by ultrasound 2 o'clock position measured 1.4 cm axilla was negative.  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 adjuvant chemotherapy with Taxol Herceptin weekly x12 followed by Herceptin maintenance every 3 weeks for 1 year 2.  Adjuvant radiation therapy 3.  Adjuvant antiestrogen therapy ----------------------------------------------------------------------------------------------------------------------------- Current treatment: Cycle 12 day 1 Taxol Herceptin Chemotoxicities: 1.  Fatigue 2.  Slight diarrhea: Responded to Lomotil 3.  Chemo-induced anemia: Stable hemoglobin 10.7 Monitoring closely for neuropathy.  She is using ice which appears to be helping her.   Monitoring closely for toxicities She has appointments with radiation oncology.  Return to clinic every 3 weeks for Herceptin and every 6 weeks for follow-up with  me.    No orders of the defined types were placed in this encounter.  The patient has a good understanding of the overall plan. she agrees with it. she will call with any problems that may develop before the next visit here. Total time spent: 30 mins including face to face time and time spent for planning, charting and co-ordination of care   Harriette Ohara, MD 02/16/22    I Gardiner Coins am scribing for Dr. Lindi Adie  I have reviewed the above documentation for accuracy and completeness, and I agree with the above.

## 2022-02-14 ENCOUNTER — Telehealth: Payer: Self-pay

## 2022-02-14 NOTE — Telephone Encounter (Signed)
Pt called stating that she thinks she has an eye infection and would like to know if she needed to come in before next treatment on Wednesday 11/29. Pt has had the same symptoms in the past. No eye pain or serious drainage noted. Advised pt that we could see her today in [facility] or she could wait to speak to Dr. Lindi Adie on Wednesday 11/29. Pt decided to wait and speak to Dr. Lindi Adie on Wednesday.

## 2022-02-15 MED FILL — Dexamethasone Sodium Phosphate Inj 100 MG/10ML: INTRAMUSCULAR | Qty: 1 | Status: AC

## 2022-02-16 ENCOUNTER — Inpatient Hospital Stay (HOSPITAL_BASED_OUTPATIENT_CLINIC_OR_DEPARTMENT_OTHER): Payer: Medicare Other | Admitting: Hematology and Oncology

## 2022-02-16 ENCOUNTER — Encounter: Payer: Self-pay | Admitting: *Deleted

## 2022-02-16 ENCOUNTER — Other Ambulatory Visit: Payer: Self-pay | Admitting: Hematology and Oncology

## 2022-02-16 ENCOUNTER — Inpatient Hospital Stay: Payer: Medicare Other

## 2022-02-16 VITALS — BP 155/96 | HR 73 | Temp 97.7°F | Resp 16 | Ht 64.0 in | Wt 233.8 lb

## 2022-02-16 VITALS — BP 155/83 | HR 73 | Resp 17

## 2022-02-16 DIAGNOSIS — Z5111 Encounter for antineoplastic chemotherapy: Secondary | ICD-10-CM | POA: Diagnosis not present

## 2022-02-16 DIAGNOSIS — Z170 Estrogen receptor positive status [ER+]: Secondary | ICD-10-CM | POA: Diagnosis not present

## 2022-02-16 DIAGNOSIS — C50412 Malignant neoplasm of upper-outer quadrant of left female breast: Secondary | ICD-10-CM

## 2022-02-16 DIAGNOSIS — Z95828 Presence of other vascular implants and grafts: Secondary | ICD-10-CM

## 2022-02-16 LAB — CBC WITH DIFFERENTIAL (CANCER CENTER ONLY)
Abs Immature Granulocytes: 0.04 10*3/uL (ref 0.00–0.07)
Basophils Absolute: 0 10*3/uL (ref 0.0–0.1)
Basophils Relative: 0 %
Eosinophils Absolute: 0.1 10*3/uL (ref 0.0–0.5)
Eosinophils Relative: 3 %
HCT: 34.2 % — ABNORMAL LOW (ref 36.0–46.0)
Hemoglobin: 11.1 g/dL — ABNORMAL LOW (ref 12.0–15.0)
Immature Granulocytes: 1 %
Lymphocytes Relative: 39 %
Lymphs Abs: 2 10*3/uL (ref 0.7–4.0)
MCH: 27.8 pg (ref 26.0–34.0)
MCHC: 32.5 g/dL (ref 30.0–36.0)
MCV: 85.7 fL (ref 80.0–100.0)
Monocytes Absolute: 0.3 10*3/uL (ref 0.1–1.0)
Monocytes Relative: 6 %
Neutro Abs: 2.6 10*3/uL (ref 1.7–7.7)
Neutrophils Relative %: 51 %
Platelet Count: 208 10*3/uL (ref 150–400)
RBC: 3.99 MIL/uL (ref 3.87–5.11)
RDW: 16.7 % — ABNORMAL HIGH (ref 11.5–15.5)
WBC Count: 5.1 10*3/uL (ref 4.0–10.5)
nRBC: 0 % (ref 0.0–0.2)

## 2022-02-16 LAB — CMP (CANCER CENTER ONLY)
ALT: 16 U/L (ref 0–44)
AST: 16 U/L (ref 15–41)
Albumin: 4.1 g/dL (ref 3.5–5.0)
Alkaline Phosphatase: 86 U/L (ref 38–126)
Anion gap: 4 — ABNORMAL LOW (ref 5–15)
BUN: 11 mg/dL (ref 8–23)
CO2: 29 mmol/L (ref 22–32)
Calcium: 9.7 mg/dL (ref 8.9–10.3)
Chloride: 106 mmol/L (ref 98–111)
Creatinine: 0.88 mg/dL (ref 0.44–1.00)
GFR, Estimated: >60 mL/min (ref >60)
Glucose, Bld: 96 mg/dL (ref 70–99)
Potassium: 4 mmol/L (ref 3.5–5.1)
Sodium: 139 mmol/L (ref 135–145)
Total Bilirubin: 0.5 mg/dL (ref 0.3–1.2)
Total Protein: 6.6 g/dL (ref 6.5–8.1)

## 2022-02-16 MED ORDER — FAMOTIDINE IN NACL 20-0.9 MG/50ML-% IV SOLN
20.0000 mg | Freq: Once | INTRAVENOUS | Status: AC
  Administered 2022-02-16: 20 mg via INTRAVENOUS
  Filled 2022-02-16: qty 50

## 2022-02-16 MED ORDER — ACETAMINOPHEN 325 MG PO TABS
650.0000 mg | ORAL_TABLET | Freq: Once | ORAL | Status: AC
  Administered 2022-02-16: 650 mg via ORAL
  Filled 2022-02-16: qty 2

## 2022-02-16 MED ORDER — SODIUM CHLORIDE 0.9% FLUSH
10.0000 mL | Freq: Once | INTRAVENOUS | Status: AC
  Administered 2022-02-16: 10 mL

## 2022-02-16 MED ORDER — SODIUM CHLORIDE 0.9 % IV SOLN
50.0000 mg/m2 | Freq: Once | INTRAVENOUS | Status: AC
  Administered 2022-02-16: 108 mg via INTRAVENOUS
  Filled 2022-02-16: qty 18

## 2022-02-16 MED ORDER — SODIUM CHLORIDE 0.9 % IV SOLN: Freq: Once | INTRAVENOUS | Status: AC

## 2022-02-16 MED ORDER — TRASTUZUMAB-DKST CHEMO 150 MG IV SOLR
6.0000 mg/kg | Freq: Once | INTRAVENOUS | Status: AC
  Administered 2022-02-16: 630 mg via INTRAVENOUS
  Filled 2022-02-16: qty 30

## 2022-02-16 MED ORDER — HEPARIN SOD (PORK) LOCK FLUSH 100 UNIT/ML IV SOLN
500.0000 [IU] | Freq: Once | INTRAVENOUS | Status: AC | PRN
  Administered 2022-02-16: 500 [IU]

## 2022-02-16 MED ORDER — SODIUM CHLORIDE 0.9% FLUSH
10.0000 mL | INTRAVENOUS | Status: DC | PRN
  Administered 2022-02-16: 10 mL

## 2022-02-16 MED ORDER — CETIRIZINE HCL 10 MG/ML IV SOLN
10.0000 mg | Freq: Once | INTRAVENOUS | Status: AC
  Administered 2022-02-16: 10 mg via INTRAVENOUS
  Filled 2022-02-16: qty 1

## 2022-02-16 MED ORDER — SODIUM CHLORIDE 0.9 % IV SOLN
10.0000 mg | Freq: Once | INTRAVENOUS | Status: AC
  Administered 2022-02-16: 10 mg via INTRAVENOUS
  Filled 2022-02-16: qty 10

## 2022-02-16 NOTE — Assessment & Plan Note (Signed)
09/07/2021:Screening mammogram detected left breast mass by ultrasound 2 o'clock position measured 1.4 cm axilla was negative.  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 adjuvant chemotherapy with Taxol Herceptin weekly x12 followed by Herceptin maintenance every 3 weeks for 1 year 2.  Adjuvant radiation therapy 3.  Adjuvant antiestrogen therapy ----------------------------------------------------------------------------------------------------------------------------- Current treatment: Cycle 12 day 1 Taxol Herceptin Chemotoxicities: 1.  Fatigue 2.  Slight diarrhea: Responded to Lomotil 3.  Chemo-induced anemia: Stable hemoglobin 10.7 Monitoring closely for neuropathy.  She is using ice which appears to be helping her.   Monitoring closely for toxicities She has appointments with radiation oncology.  Return to clinic every 3 weeks for Herceptin and every 6 weeks for follow-up with me.

## 2022-02-16 NOTE — Patient Instructions (Signed)
[facility] ONCOLOGY  Discharge Instructions: Thank you for choosing [facility] to provide your oncology and hematology care.   If you have a lab appointment with the [facility], please go directly to the [facility] and check in at the registration area.   Wear comfortable clothing and clothing appropriate for easy access to any Portacath or PICC line.   We strive to give you quality time with your provider. You may need to reschedule your appointment if you arrive late (15 or more minutes).  Arriving late affects you and other patients whose appointments are after yours.  Also, if you miss three or more appointments without notifying the office, you may be dismissed from the clinic at the provider's discretion.      For prescription refill requests, have your pharmacy contact our office and allow 72 hours for refills to be completed.    Today you received the following chemotherapy and/or immunotherapy agents: Ogivri/Taxol      To help prevent nausea and vomiting after your treatment, we encourage you to take your nausea medication as directed.  BELOW ARE SYMPTOMS THAT SHOULD BE REPORTED IMMEDIATELY: *FEVER GREATER THAN 100.4 F (38 C) OR HIGHER *CHILLS OR SWEATING *NAUSEA AND VOMITING THAT IS NOT CONTROLLED WITH YOUR NAUSEA MEDICATION *UNUSUAL SHORTNESS OF BREATH *UNUSUAL BRUISING OR BLEEDING *URINARY PROBLEMS (pain or burning when urinating, or frequent urination) *BOWEL PROBLEMS (unusual diarrhea, constipation, pain near the anus) TENDERNESS IN MOUTH AND THROAT WITH OR WITHOUT PRESENCE OF ULCERS (sore throat, sores in mouth, or a toothache) UNUSUAL RASH, SWELLING OR PAIN  UNUSUAL VAGINAL DISCHARGE OR ITCHING   Items with * indicate a potential emergency and should be followed up as soon as possible or go to the Emergency Department if any problems should occur.  Please show the CHEMOTHERAPY ALERT CARD or IMMUNOTHERAPY ALERT CARD at check-in  to the Emergency Department and triage nurse.  Should you have questions after your visit or need to cancel or reschedule your appointment, please contact [facility]  Dept: [phone]  and follow the prompts.  Office hours are 8:00 a.m. to 4:30 p.m. Monday - Friday. Please note that voicemails left after 4:00 p.m. may not be returned until the following business day.  We are closed weekends and major holidays. You have access to a nurse at all times for urgent questions. Please call the main number to the clinic Dept: [phone] and follow the prompts.   For any non-urgent questions, you may also contact your provider using MyChart. We now offer e-Visits for anyone 51 and older to request care online for non-urgent symptoms. For details visit [email or web address].   Also download the MyChart app! Go to the app store, search "MyChart", open the app, select [facility], and log in with your MyChart username and password.  Masks are optional in the cancer centers. If you would like for your care team to wear a mask while they are taking care of you, please let them know. You may have one support person who is at least 66 years old accompany you for your appointments.

## 2022-02-16 NOTE — Progress Notes (Signed)
Location of Breast Cancer: Malignant neoplasm of upper-outer quadrant of left breast in female, estrogen receptor positive   Histology per Pathology Report:   FINAL MICROSCOPIC DIAGNOSIS:  A. 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 - Invasive carcinoma 0.3 cm from posterior margin. - DCIS less than 0.1 cm from posterior margin. - Biopsy site and biopsy clip. - See oncology table.  B. LYMPH NODE, LEFT AXILLARY, SENTINEL, EXCISION: - One lymph node negative for metastatic carcinoma (0/1).  C. LYMPH NODE, LEFT AXILLARY, SENTINEL, EXCISION: - One lymph node negative for metastatic carcinoma (0/1).  D. LYMPH NODE, LEFT AXILLARY, SENTINEL, EXCISION: - One lymph node negative for metastatic carcinoma (0/1).  ONCOLOGY TABLE: INVASIVE CARCINOMA OF THE BREAST:  Resection Procedure: Left breast lumpectomy and three sentinel lymph nodes Specimen Laterality: Left breast Histologic Type: Ductal Histologic Grade:      Glandular (Acinar)/Tubular Differentiation: 3      Nuclear Pleomorphism: 2      Mitotic Rate: 3      Overall Grade: 3 Tumor Size: 1.9 x 1.4 x 1.3 cm Ductal Carcinoma In Situ: Present, high-grade with focal necrosis Treatment Effect in the Breast: No known presurgical therapy Margins: All surgical margins negative for invasive carcinoma      Distance from Closest Margin (mm): 3 mm      Specify Closest Margin (required only if <55m): Posterior DCIS Margins:      Distance from Closest Margin (mm): Less than 1 mm      Specify Closest Margin (required only if <125m: Posterior Regional Lymph Nodes:      Number of Lymph Nodes Examined: 3      Number of Sentinel Nodes Examined: 3      Number of Lymph Nodes with Macrometastases (>2 mm): 0      Number of Lymph Nodes with Micrometastases: 0      Number of Lymph Nodes with Isolated Tumor Cells (=0.2 mm or =200 cells): 0 Breast  Biomarker Testing Performed on Previous Biopsy:      Testing Performed on Case Number: [id number]            Estrogen Receptor: 90%, positive, weak staining            Progesterone Receptor: 0%, negative            HER2: Positive (3+)            Ki-67: 55% Pathologic Stage Classification (pTNM, AJCC 8th Edition): pT1c, pN0 Representative Tumor Block: A1-A4 (v4.5.0.0)  GROSS DESCRIPTION: A. Specimen type: received fresh and subsequently placed in formalin labeled with the patient's name and "Lt breast lumpectomy with seed" is a piece of fibrofatty tissue grossly consistent with clinically stated lumpectomy. Time out of body: not recorded in OR, time in formalin: 12:45 on 10/26/21. Size: 7.8 cm medial-lateral, 5.7 cm superior-inferior, and 3.3 cm anterior-posterior. Orientation: the specimen has been previously inked in the OR as follows: anterior - green, inferior - blue, lateral - orange, medial - yellow, posterior - black, and superior - red. Localized area: the preoperatively implanted radioactive seed is identified, removed, and sequestered according to protocol. Cut surface: a 1.9 x 1.4 x 1.3 cm white-tan, solid, firm lesion surrounding a ribbon-shaped biopsy clip. Margins: superior - 2.9 cm, inferior - 1.9 cm, medial - 3.3 cm, lateral - 1.8 cm, anterior - 1.3 cm, and posterior - 1.0 cm. Block Summary: A1: lesion surrounding ribbon clip with posterior  margin A2-A3: lesion with anterior and lateral margins A4: lesion with anterior margin A5: lesion with posterior margin A6: lesion with posterior and inferior margins A7: closest anterior margin A8: closest medial margin   B. Received fresh and subsequently placed in formalin labeled with the patient's name and "Left axillary sentinel node" is a 4.0 x 0.9 x 0.4 cm red-tan, rubbery possible lymph node that is sectioned and entirely submitted in cassette B1.  C. Received in the same container as part B is a 1.7 x 1.5 x 0.5 cm  tan, rubbery possible lymph node that is longitudinally sectioned and entirely submitted in cassette C1.  D. Received in the same container as part B is a 0.5 x 0.2 x 0.2 cm tan, rubbery possible lymph node that is submitted in toto in cassette D1. (LEF 10/26/2021)  Final Diagnosis performed by Claudette Laws, MD.   Electronically signed 10/28/2021   Receptor Status: ER(90% positive), PR (O%), Her2-neu (3 positive), Ki-67(55%)  Did patient present with symptoms (if so, please note symptoms) or was this found on screening mammography?: routine mammogram  Past/Anticipated interventions by surgeon, if any: 10-26-21 Procedure: Left breast seed localized lumpectomy with left axillary sentinel lymph node mapping using mag trace in placement of an 8 French right internal jugular Port-A-Cath under fluoroscopic and ultrasound guidance Surgeon: Erroll Luna, MD   Past/Anticipated interventions by medical oncology, if any:  01-13-22 [facility] ASSESSMENT & PLAN:  Malignant neoplasm of upper-outer quadrant of left breast in female, estrogen receptor positive ([place]) 09/07/2021:Screening mammogram detected left breast mass by ultrasound 2 o'clock position measured 1.4 cm axilla was negative.  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 adjuvant chemotherapy with Taxol Herceptin weekly x12 followed by Herceptin maintenance every 3 weeks for 1 year 2.  Adjuvant radiation therapy 3.  Adjuvant antiestrogen therapy ----------------------------------------------------------------------------------------------------------------------------- Current treatment: Cycle 7 day 1 Taxol Herceptin Chemotoxicities: 1.  Rash: Anal candidiasis: Treated with Diflucan, improved  significantly 2. Fatigue 3.  Slight diarrhea: Responded to Lomotil Monitoring closely for neuropathy.  She is using ice which appears to be helping her.   Monitoring closely for toxicities   Return to clinic weekly for Taxol and Herceptin and every other week for follow-up with me.  Lymphedema issues, if any:  none at this time  Pain issues, if any:  denies pain  SAFETY ISSUES: Prior radiation? no Pacemaker/ICD? no Possible current pregnancy?no Is the patient on methotrexate? no  Current Complaints / other details:  just general questions about plan, how many weeks?? Etc.   Vitals:   02/25/22 1219  BP: 119/71  Pulse: 100  Resp: 18  Temp: 98.2 F (36.8 C)  SpO2: 100%

## 2022-02-21 ENCOUNTER — Telehealth: Payer: Self-pay | Admitting: Hematology and Oncology

## 2022-02-21 NOTE — Telephone Encounter (Signed)
Scheduled appointments per WQ. Patient is aware. 

## 2022-02-24 NOTE — Progress Notes (Signed)
Radiation Oncology         [phone] ________________________________  Name: Karen Vazquez MRN: [id number]  Date: 02/25/2022  DOB: [birth year]  Follow-Up Visit Note  Outpatient  CC: Kathyrn Drown, MD  Nicholas Lose, MD  Diagnosis:      ICD-10-CM   1. Malignant neoplasm of upper-outer quadrant of left breast in female, estrogen receptor positive ([place])  C50.412    Z17.0      Stage IA (cT1b, cN0, cM0) Left Breast UOQ, Invasive ductal carcinoma with high-grade DCIS, ER+ / PR- / Her2+, Grade 3: s/p lumpectomy w/ SLN mapping with clean margins and negative nodes, followed by adjuvant chemotherapy with Taxol-Herceptin x 12 cycles   Cancer Staging  Malignant neoplasm of upper-outer quadrant of left breast in female, estrogen receptor positive ([place]) Staging form: Breast, AJCC 8th Edition - Clinical: Stage IA (cT1b, cN0, cM0, G3, ER+, PR-, HER2+) - Signed by Nicholas Lose, MD on 09/15/2021  CHIEF COMPLAINT: Here to discuss management of left breast cancer  Narrative:  The patient returns today for follow-up.     On her breast clinic consultation date of 09/15/21, she opted to pursue genetic testing. Results showed no clinically significant variants detected by BRCAplus or +RNAinsight testing.  Since her consultation date, the patient opted to proceed with left breast lumpectomy with SLN mapping and port placement on 10/26/21 under the care of Dr. Brantley Stage. Pathology from the procedure revealed: tumor size of 1.9 cm; histology of grade 3 invasive ductal carcinoma with high-grade DCIS and focal necrosis; all margins negative for both invasive and in-situ carcinoma; margin status to invasive disease of 0.3 cm from the posterior margin; margin status to in situ disease of less than 0.1 cm from the posterior margin; nodal status of 3/3 left axillary sentinel lymph node excisions negative for metastatic carcinoma;  ER status: 90% positive with weak staining intensity; PR status negative;  Proliferation marker Ki67 at 55%; Her2 status positive; Grade 3.  Post-operatively, she developed a left axillary seroma which is slowly resolving on it's own. This does not seem to cause her any significant discomfort.   Systemic therapy, if applicable, involved (dates and therapy as follows): She has been treated with adjuvant chemotherapy consisting of Taxol Herceptin weekly x12 cycles from 11/25/21 through 02/16/22 under the care of Dr. Lindi Adie. 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 She will now continue with Herceptin maintenance which will be administered every 3 weeks x 1 year.   Symptomatically, the patient reports:  left axillary seroma, resolving fatigue from chemotherapy  Pain issues, if any:  denies pain  SAFETY ISSUES: Prior radiation? no Pacemaker/ICD? no Possible current pregnancy?no Is the patient on methotrexate? no          ALLERGIES:  is allergic to lisinopril.  Meds: Current Outpatient Medications  Medication Sig Dispense Refill   ALPRAZolam (XANAX) 0.5 MG tablet TAKE ONE TABLET BY MOUTH TWO TIMES DAILYAS NEEDED FOR ANXIETY OR SLEEP. 30 tablet 5   amLODipine (NORVASC) 2.5 MG tablet Take 1 tablet (2.5 mg total) by mouth daily. 30 tablet 5   ARMOUR THYROID 15 MG tablet TAKE ONE TABLET ONCE DAILY 30 tablet 5   diphenoxylate-atropine (LOMOTIL) 2.5-0.025 MG tablet  Take 1 tablet by mouth 4 (four) times daily as needed for diarrhea or loose stools. 30 tablet 1   lidocaine-prilocaine (EMLA) cream Apply to affected area once 30 g 3   oxybutynin (DITROPAN-XL) 5 MG 24 hr tablet Take by mouth.     pantoprazole (PROTONIX) 40 MG tablet Take 1 tablet (40 mg  total) by mouth daily. 30 tablet 5   pregabalin (LYRICA) 50 MG capsule 1 each evening 30 capsule 5   thyroid (ARMOUR THYROID) 30 MG tablet TAKE ONE (1) TABLET BY MOUTH EVERY DAY 30 tablet 5   No current facility-administered medications for this encounter.    Physical Findings:  height is _0  (1.626 m) and weight is 236 lb 9.6 oz (107.3 kg). Her oral temperature is 98.2 F (36.8 C). Her blood pressure is 119/71 and her pulse is 100. Her respiration is 18 and oxygen saturation is 100%. .     General: Alert and oriented, in no acute distress HEENT: Alopecia of scalp Extremities: No cyanosis or edema in upper extremities. Musculoskeletal: symmetric strength and muscle tone throughout.  Ambulatory.  Good range of motion in shoulders Neurologic: No obvious focalities. Speech is fluent.  Psychiatric: Judgment and insight are intact. Affect is appropriate. Breast exam reveals satisfactory healing of left lumpectomy and left axillary scars.  She has a palpable seroma at the left axilla under her left axillary scar  Lab Findings: Lab Results  Component Value Date   WBC 5.1 02/16/2022   HGB 11.1 (L) 02/16/2022   HCT 34.2 (L) 02/16/2022   MCV 85.7 02/16/2022   PLT 208 02/16/2022      Radiographic Findings: No results found.  Impression/Plan: Left breast cancer, node-negative We discussed adjuvant radiotherapy today.  I recommend postoperative radiation therapy to the left breast using standard hypofractionation (4 weeks total) in order to reduce risk of recurrence in the left breast.  I reviewed the logistics, benefits, risks, and potential side effects of this treatment in detail. Risks may include but not necessary be limited to acute and late injury tissue in the radiation fields such as skin irritation (change in color/pigmentation, itching, dryness, pain, peeling). She may experience fatigue. We also discussed possible risk of long term cosmetic changes or scar tissue. There is also a  smaller risk for lung toxicity, cardiac toxicity, brachial plexopathy, lymphedema, musculoskeletal changes, rib fragility or induction of a second malignancy, late chronic non-healing soft tissue wound.    The patient asked good questions which I answered to her satisfaction. She is enthusiastic about proceeding with treatment. A consent form has been  signed and placed in her chart.  We will proceed with CT simulation today and start her treatment on December 27.    On date of service, in total, I spent 30 minutes on this encounter. Patient was seen in person.  _____________________________________   Eppie Gibson, MD  This document serves as a record of services personally performed by Eppie Gibson, MD. It was created on her behalf by Roney Mans, a trained medical scribe. The creation of this record is based on the scribe's personal observations and the provider's statements to them. This document has been checked and approved by the attending provider.

## 2022-02-25 ENCOUNTER — Encounter: Payer: Self-pay | Admitting: Radiation Oncology

## 2022-02-25 ENCOUNTER — Ambulatory Visit
Admission: RE | Admit: 2022-02-25 | Discharge: 2022-02-25 | Disposition: A | Discharge status: Home / Self Care | Payer: Medicare Other | Source: Ambulatory Visit | Attending: Radiation Oncology | Admitting: Radiation Oncology

## 2022-02-25 VITALS — BP 119/71 | HR 100 | Temp 98.2°F | Resp 18 | Ht 64.0 in | Wt 236.6 lb

## 2022-02-25 DIAGNOSIS — Z79890 Hormone replacement therapy (postmenopausal): Secondary | ICD-10-CM | POA: Diagnosis not present

## 2022-02-25 DIAGNOSIS — C50412 Malignant neoplasm of upper-outer quadrant of left female breast: Secondary | ICD-10-CM | POA: Insufficient documentation

## 2022-02-25 DIAGNOSIS — Z170 Estrogen receptor positive status [ER+]: Secondary | ICD-10-CM | POA: Insufficient documentation

## 2022-02-25 DIAGNOSIS — Z79899 Other long term (current) drug therapy: Secondary | ICD-10-CM | POA: Diagnosis not present

## 2022-02-25 DIAGNOSIS — Z510 Encounter for antineoplastic radiation therapy: Secondary | ICD-10-CM | POA: Diagnosis present

## 2022-02-28 ENCOUNTER — Encounter: Payer: Self-pay | Admitting: *Deleted

## 2022-03-01 DIAGNOSIS — Z510 Encounter for antineoplastic radiation therapy: Secondary | ICD-10-CM | POA: Diagnosis not present

## 2022-03-03 ENCOUNTER — Encounter: Payer: Self-pay | Admitting: *Deleted

## 2022-03-03 LAB — SURGICAL PATHOLOGY

## 2022-03-09 ENCOUNTER — Inpatient Hospital Stay: Payer: Medicare Other | Attending: Hematology and Oncology

## 2022-03-09 VITALS — BP 154/61 | HR 89 | Temp 98.2°F | Resp 17 | Wt 237.5 lb

## 2022-03-09 DIAGNOSIS — Z5111 Encounter for antineoplastic chemotherapy: Secondary | ICD-10-CM | POA: Insufficient documentation

## 2022-03-09 DIAGNOSIS — Z170 Estrogen receptor positive status [ER+]: Secondary | ICD-10-CM | POA: Diagnosis not present

## 2022-03-09 DIAGNOSIS — C50411 Malignant neoplasm of upper-outer quadrant of right female breast: Secondary | ICD-10-CM | POA: Diagnosis not present

## 2022-03-09 MED ORDER — ACETAMINOPHEN 325 MG PO TABS
650.0000 mg | ORAL_TABLET | Freq: Once | ORAL | Status: AC
  Administered 2022-03-09: 650 mg via ORAL
  Filled 2022-03-09: qty 2

## 2022-03-09 MED ORDER — HEPARIN SOD (PORK) LOCK FLUSH 100 UNIT/ML IV SOLN
500.0000 [IU] | Freq: Once | INTRAVENOUS | Status: AC | PRN
  Administered 2022-03-09: 500 [IU]

## 2022-03-09 MED ORDER — TRASTUZUMAB-DKST CHEMO 150 MG IV SOLR
6.0000 mg/kg | Freq: Once | INTRAVENOUS | Status: AC
  Administered 2022-03-09: 630 mg via INTRAVENOUS
  Filled 2022-03-09: qty 30

## 2022-03-09 MED ORDER — DIPHENHYDRAMINE HCL 25 MG PO CAPS
50.0000 mg | ORAL_CAPSULE | Freq: Once | ORAL | Status: AC
  Administered 2022-03-09: 50 mg via ORAL
  Filled 2022-03-09: qty 2

## 2022-03-09 MED ORDER — SODIUM CHLORIDE 0.9% FLUSH
10.0000 mL | INTRAVENOUS | Status: DC | PRN
  Administered 2022-03-09: 10 mL

## 2022-03-09 MED ORDER — SODIUM CHLORIDE 0.9 % IV SOLN: Freq: Once | INTRAVENOUS | Status: AC

## 2022-03-09 NOTE — Patient Instructions (Signed)
[facility]  Discharge Instructions: Thank you for choosing [facility] to provide your oncology and hematology care.   If you have a lab appointment with the Cancer Center, please go directly to the Cancer Center and check in at the registration area.   Wear comfortable clothing and clothing appropriate for easy access to any Portacath or PICC line.   We strive to give you quality time with your provider. You may need to reschedule your appointment if you arrive late (15 or more minutes).  Arriving late affects you and other patients whose appointments are after yours.  Also, if you miss three or more appointments without notifying the office, you may be dismissed from the clinic at the provider's discretion.      For prescription refill requests, have your pharmacy contact our office and allow 72 hours for refills to be completed.    Today you received the following chemotherapy and/or immunotherapy agents: Ogivri      To help prevent nausea and vomiting after your treatment, we encourage you to take your nausea medication as directed.  BELOW ARE SYMPTOMS THAT SHOULD BE REPORTED IMMEDIATELY: *FEVER GREATER THAN 100.4 F (38 C) OR HIGHER *CHILLS OR SWEATING *NAUSEA AND VOMITING THAT IS NOT CONTROLLED WITH YOUR NAUSEA MEDICATION *UNUSUAL SHORTNESS OF BREATH *UNUSUAL BRUISING OR BLEEDING *URINARY PROBLEMS (pain or burning when urinating, or frequent urination) *BOWEL PROBLEMS (unusual diarrhea, constipation, pain near the anus) TENDERNESS IN MOUTH AND THROAT WITH OR WITHOUT PRESENCE OF ULCERS (sore throat, sores in mouth, or a toothache) UNUSUAL RASH, SWELLING OR PAIN  UNUSUAL VAGINAL DISCHARGE OR ITCHING   Items with * indicate a potential emergency and should be followed up as soon as possible or go to the Emergency Department if any problems should occur.  Please show the CHEMOTHERAPY ALERT CARD or IMMUNOTHERAPY ALERT CARD at check-in to the  Emergency Department and triage nurse.  Should you have questions after your visit or need to cancel or reschedule your appointment, please contact [facility]  Dept: [phone]  and follow the prompts.  Office hours are 8:00 a.m. to 4:30 p.m. Monday - Friday. Please note that voicemails left after 4:00 p.m. may not be returned until the following business day.  We are closed weekends and major holidays. You have access to a nurse at all times for urgent questions. Please call the main number to the clinic Dept: [phone] and follow the prompts.   For any non-urgent questions, you may also contact your provider using MyChart. We now offer e-Visits for anyone 18 and older to request care online for non-urgent symptoms. For details visit [email or web address].   Also download the MyChart app! Go to the app store, search "MyChart", open the app, select [facility], and log in with your MyChart username and password.  Masks are optional in the cancer centers. If you would like for your care team to wear a mask while they are taking care of you, please let them know. You may have one support person who is at least 66 years old accompany you for your appointments. 

## 2022-03-16 ENCOUNTER — Ambulatory Visit
Admission: RE | Admit: 2022-03-16 | Discharge: 2022-03-16 | Disposition: A | Discharge status: Home / Self Care | Payer: Medicare Other | Source: Ambulatory Visit | Attending: Radiation Oncology | Admitting: Radiation Oncology

## 2022-03-16 ENCOUNTER — Other Ambulatory Visit: Payer: Self-pay

## 2022-03-16 DIAGNOSIS — Z510 Encounter for antineoplastic radiation therapy: Secondary | ICD-10-CM | POA: Diagnosis not present

## 2022-03-16 LAB — RAD ONC ARIA SESSION SUMMARY
Course Elapsed Days: 0
Plan Fractions Treated to Date: 1
Plan Prescribed Dose Per Fraction: 2.67 Gy
Plan Total Fractions Prescribed: 15
Plan Total Prescribed Dose: 40.05 Gy
Reference Point Dosage Given to Date: 2.67 Gy
Reference Point Session Dosage Given: 2.67 Gy
Session Number: 1

## 2022-03-17 ENCOUNTER — Ambulatory Visit
Admission: RE | Admit: 2022-03-17 | Discharge: 2022-03-17 | Disposition: A | Discharge status: Home / Self Care | Payer: Medicare Other | Source: Ambulatory Visit | Attending: Radiation Oncology | Admitting: Radiation Oncology

## 2022-03-17 ENCOUNTER — Other Ambulatory Visit: Payer: Self-pay

## 2022-03-17 DIAGNOSIS — Z510 Encounter for antineoplastic radiation therapy: Secondary | ICD-10-CM | POA: Diagnosis not present

## 2022-03-17 LAB — RAD ONC ARIA SESSION SUMMARY
Course Elapsed Days: 1
Plan Fractions Treated to Date: 2
Plan Prescribed Dose Per Fraction: 2.67 Gy
Plan Total Fractions Prescribed: 15
Plan Total Prescribed Dose: 40.05 Gy
Reference Point Dosage Given to Date: 5.34 Gy
Reference Point Session Dosage Given: 2.67 Gy
Session Number: 2

## 2022-03-18 ENCOUNTER — Ambulatory Visit
Admission: RE | Admit: 2022-03-18 | Discharge: 2022-03-18 | Disposition: A | Discharge status: Home / Self Care | Payer: Medicare Other | Source: Ambulatory Visit | Attending: Radiation Oncology | Admitting: Radiation Oncology

## 2022-03-18 ENCOUNTER — Other Ambulatory Visit: Payer: Self-pay

## 2022-03-18 DIAGNOSIS — Z510 Encounter for antineoplastic radiation therapy: Secondary | ICD-10-CM | POA: Diagnosis not present

## 2022-03-18 LAB — RAD ONC ARIA SESSION SUMMARY
Course Elapsed Days: 2
Plan Fractions Treated to Date: 3
Plan Prescribed Dose Per Fraction: 2.67 Gy
Plan Total Fractions Prescribed: 15
Plan Total Prescribed Dose: 40.05 Gy
Reference Point Dosage Given to Date: 8.01 Gy
Reference Point Session Dosage Given: 2.67 Gy
Session Number: 3

## 2022-03-22 ENCOUNTER — Ambulatory Visit
Admission: RE | Admit: 2022-03-22 | Discharge: 2022-03-22 | Disposition: A | Discharge status: Home / Self Care | Payer: Medicare Other | Source: Ambulatory Visit | Attending: Radiation Oncology | Admitting: Radiation Oncology

## 2022-03-22 ENCOUNTER — Other Ambulatory Visit: Payer: Self-pay

## 2022-03-22 DIAGNOSIS — Z170 Estrogen receptor positive status [ER+]: Secondary | ICD-10-CM | POA: Insufficient documentation

## 2022-03-22 DIAGNOSIS — Z5112 Encounter for antineoplastic immunotherapy: Secondary | ICD-10-CM | POA: Insufficient documentation

## 2022-03-22 DIAGNOSIS — C50412 Malignant neoplasm of upper-outer quadrant of left female breast: Secondary | ICD-10-CM | POA: Insufficient documentation

## 2022-03-22 DIAGNOSIS — Z510 Encounter for antineoplastic radiation therapy: Secondary | ICD-10-CM | POA: Insufficient documentation

## 2022-03-22 DIAGNOSIS — Z79811 Long term (current) use of aromatase inhibitors: Secondary | ICD-10-CM | POA: Diagnosis not present

## 2022-03-22 DIAGNOSIS — Z79899 Other long term (current) drug therapy: Secondary | ICD-10-CM | POA: Diagnosis not present

## 2022-03-22 LAB — RAD ONC ARIA SESSION SUMMARY
Course Elapsed Days: 6
Plan Fractions Treated to Date: 4
Plan Prescribed Dose Per Fraction: 2.67 Gy
Plan Total Fractions Prescribed: 15
Plan Total Prescribed Dose: 40.05 Gy
Reference Point Dosage Given to Date: 10.68 Gy
Reference Point Session Dosage Given: 2.67 Gy
Session Number: 4

## 2022-03-22 MED ORDER — ALRA NON-METALLIC DEODORANT (RAD-ONC)
1.0000 | Freq: Once | TOPICAL | Status: AC
  Administered 2022-03-22: 1 via TOPICAL

## 2022-03-22 MED ORDER — RADIAPLEXRX EX GEL: Freq: Once | CUTANEOUS | Status: AC

## 2022-03-22 NOTE — Progress Notes (Signed)
Patient Care Team: Kathyrn Drown, MD as PCP - General (Family Medicine) Rockwell Germany, RN as Oncology Nurse Navigator Mauro Kaufmann, RN as Oncology Nurse Navigator Erroll Luna, MD as Consulting Physician (General Surgery) Nicholas Lose, MD as Consulting Physician (Hematology and Oncology) Eppie Gibson, MD as Attending Physician (Radiation Oncology)  DIAGNOSIS:  Encounter Diagnosis  Name Primary?   Malignant neoplasm of upper-outer quadrant of left breast in female, estrogen receptor positive ([place]) Yes    SUMMARY OF ONCOLOGIC HISTORY: Oncology History  Malignant neoplasm of upper-outer quadrant of left breast in female, estrogen receptor positive ([place])  09/07/2021 Initial Diagnosis   Screening mammogram detected left breast mass by ultrasound 2 o'clock position measured 1.4 cm axilla was negative.  Ultrasound-guided biopsy revealed grade 2 IDC with focal necrosis ER 90% weak, PR 0%, Ki-67 55%, HER2 3+ positive   09/15/2021 Cancer Staging   Staging form: Breast, AJCC 8th Edition - Clinical: Stage IA (cT1b, cN0, cM0, G3, ER-, PR-, HER2+) - Signed by Eppie Gibson, MD on 03/22/2022 Stage prefix: Initial diagnosis Histologic grading system: 3 grade system    Genetic Testing   [facility] CustomNext Panel was Negative. Report date is 10/04/2021.  The CustomNext-Cancer+RNAinsight panel offered by [facility] includes sequencing and rearrangement analysis for the following 47 genes:  APC, ATM, AXIN2, BARD1, BMPR1A, BRCA1, BRCA2, BRIP1, CDH1, CDK4, CDKN2A, CHEK2, CTNNA1, DICER1, EPCAM, GREM1, HOXB13, KIT, MEN1, MLH1, MSH2, MSH3, MSH6, MUTYH, NBN, NF1, NTHL1, PALB2, PDGFRA, PMS2, POLD1, POLE, PTEN, RAD50, RAD51C, RAD51D, SDHA, SDHB, SDHC, SDHD, SMAD4, SMARCA4, STK11, TP53, TSC1, TSC2, and VHL.  RNA data is routinely analyzed for use in variant interpretation for all genes.   11/25/2021 -  Chemotherapy   Patient is on Treatment Plan : BREAST Paclitaxel + Trastuzumab q7d / Trastuzumab q21d        CHIEF COMPLIANT: Follow up herceptin  INTERVAL HISTORY: Karen Vazquez is a 67 y.o with the above mentioned left lumpectomy followed by adjuvant Taxol and herceptin. She presents to the clinic for a follow-up and treatment.  She is currently on Herceptin maintenance therapy.  She is also undergoing radiation at the same time.  Her last radiation will be in 04/13/2022.  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  She does feel very tired due to radiation because that she is unable to sleep well at night.  She does take Xanax which helps her.   ALLERGIES:  is allergic to lisinopril.  MEDICATIONS:  Current Outpatient Medications  Medication Sig Dispense Refill   [START ON 04/30/2022] anastrozole (ARIMIDEX) 1 MG tablet Take 1 tablet (1 mg total) by mouth daily. 90 tablet 3   ALPRAZolam (XANAX) 0.5 MG tablet TAKE ONE TABLET BY MOUTH TWO TIMES DAILYAS NEEDED FOR ANXIETY OR SLEEP. 30 tablet 5   amLODipine (NORVASC) 2.5 MG tablet Take 1 tablet (2.5 mg total) by mouth daily. 30 tablet 5   ARMOUR THYROID 15 MG tablet TAKE ONE TABLET ONCE DAILY 30 tablet 5   diphenoxylate-atropine (LOMOTIL) 2.5-0.025 MG tablet Take 1 tablet by mouth 4 (four) times daily as needed for diarrhea or loose stools. 30 tablet 1   lidocaine-prilocaine (EMLA) cream Apply to affected area once 30 g 3   oxybutynin (DITROPAN-XL) 5 MG 24 hr tablet Take by mouth.     pantoprazole (PROTONIX) 40 MG tablet Take 1 tablet (40 mg total) by mouth daily. 30 tablet 5   pregabalin (LYRICA) 50 MG capsule 1 each evening  30 capsule 5   thyroid (ARMOUR THYROID) 30 MG tablet TAKE ONE (1) TABLET BY MOUTH EVERY DAY 30 tablet 5   No current facility-administered medications for this visit.    PHYSICAL EXAMINATION: ECOG PERFORMANCE STATUS: 1 - Symptomatic but completely ambulatory  Vitals:   03/30/22 1149  BP: (!) 164/74  Pulse: 81  Resp: 18  Temp: (!) 97.5 F (36.4 C)  SpO2: 96%   Filed  Weights   03/30/22 1149  Weight: 233 lb 9.6 oz (106 kg)      LABORATORY DATA:  I have reviewed the data as listed    Latest Ref Rng & Units 02/16/2022   10:21 AM 02/09/2022    8:18 AM 02/02/2022    9:08 AM  CMP  Glucose 70 - 99 mg/dL 96  98  100   BUN 8 - 23 mg/dL _0 Creatinine 0.44 - 1.00 mg/dL 0.88  0.93  0.92   Sodium 135 - 145 mmol/L 139  140  140   Potassium 3.5 - 5.1 mmol/L 4.0  4.0  3.9   Chloride 98 - 111 mmol/L 106  107  106   CO2 22 - 32 mmol/L _1 Calcium 8.9 - 10.3 mg/dL 9.7  9.6  9.3   Total Protein 6.5 - 8.1 g/dL 6.6  6.0  6.2   Total Bilirubin 0.3 - 1.2 mg/dL 0.5  0.5  0.4   Alkaline Phos 38 - 126 U/L 86  76  87   AST 15 - 41 U/L _2 ALT 0 - 44 U/L _3 Lab Results  Component Value Date   WBC 5.6 03/30/2022   HGB 10.5 (L) 03/30/2022   HCT 32.8 (L) 03/30/2022   MCV 80.4 03/30/2022   PLT 197 03/30/2022   NEUTROABS 3.7 03/30/2022    ASSESSMENT & PLAN:  Malignant neoplasm of upper-outer quadrant of left breast in female, estrogen receptor positive ([place]) 09/07/2021:Screening mammogram detected left breast mass by ultrasound 2 o'clock position measured 1.4 cm axilla was negative.  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 adjuvant chemotherapy with Taxol Herceptin weekly x12 followed by Herceptin maintenance every 3 weeks for 1 year 2.  Adjuvant radiation therapy until 04/13/2022 3.  Adjuvant antiestrogen therapy ----------------------------------------------------------------------------------------------------------------------------- Current treatment: Herceptin maintenance therapy (until September 2024) Toxicities: None Once radiation is completed and we will start her on  antiestrogen therapy.  (Approximately 04/30/2022)  Anastrozole counseling: We discussed the risks and benefits of anti-estrogen therapy with aromatase inhibitors. These include but not limited to insomnia, hot flashes, mood changes, vaginal dryness, bone density loss, and weight gain. We strongly believe that the benefits far outweigh the risks. Patient understands these risks and consented to starting treatment. Planned treatment duration is 7 years. I sent a prescription for anastrozole to her pharmacy.  Return to clinic every 3 weeks for Herceptin and every 6 weeks for follow-up with me  We will set her up for a survivorship care plan visit in 6 weeks when she comes back along with her Herceptin treatment.   No orders of the defined types were placed in this encounter.  The patient has a good understanding of the overall plan. she agrees with it. she will call with any problems that may develop before the next visit here. Total time spent: 30 mins including face to face time and time spent for planning, charting and co-ordination of care   Harriette Ohara, MD 03/30/22    I Gardiner Coins am acting as a Education administrator for [employer]  I have reviewed the above documentation for accuracy and completeness, and I agree with the above.

## 2022-03-22 NOTE — Progress Notes (Signed)
Pt here for patient teaching. Pt given Radiation and You booklet, skin care instructions, Alra deodorant, and Radiaplex gel. Reviewed areas of pertinence such as fatigue, hair loss, nausea and vomiting, skin changes, breast tenderness, and breast swelling. Pt able to give teach back of to pat skin, use unscented/gentle soap, and drink plenty of water, apply Radiaplex bid, avoid applying anything to skin within 4 hours of treatment, avoid wearing an under wire bra, and to use an electric razor if they must shave. Pt verbalizes understanding of information given and will contact nursing with any questions or concerns.     [email or web address]      

## 2022-03-23 ENCOUNTER — Ambulatory Visit
Admission: RE | Admit: 2022-03-23 | Discharge: 2022-03-23 | Disposition: A | Discharge status: Home / Self Care | Payer: Medicare Other | Source: Ambulatory Visit | Attending: Radiation Oncology | Admitting: Radiation Oncology

## 2022-03-23 ENCOUNTER — Other Ambulatory Visit: Payer: Self-pay

## 2022-03-23 DIAGNOSIS — Z5112 Encounter for antineoplastic immunotherapy: Secondary | ICD-10-CM | POA: Diagnosis not present

## 2022-03-23 LAB — RAD ONC ARIA SESSION SUMMARY
Course Elapsed Days: 7
Plan Fractions Treated to Date: 5
Plan Prescribed Dose Per Fraction: 2.67 Gy
Plan Total Fractions Prescribed: 15
Plan Total Prescribed Dose: 40.05 Gy
Reference Point Dosage Given to Date: 13.35 Gy
Reference Point Session Dosage Given: 2.67 Gy
Session Number: 5

## 2022-03-24 ENCOUNTER — Other Ambulatory Visit: Payer: Self-pay

## 2022-03-24 ENCOUNTER — Ambulatory Visit
Admission: RE | Admit: 2022-03-24 | Discharge: 2022-03-24 | Disposition: A | Discharge status: Home / Self Care | Payer: Medicare Other | Source: Ambulatory Visit | Attending: Radiation Oncology | Admitting: Radiation Oncology

## 2022-03-24 DIAGNOSIS — Z5112 Encounter for antineoplastic immunotherapy: Secondary | ICD-10-CM | POA: Diagnosis not present

## 2022-03-24 LAB — RAD ONC ARIA SESSION SUMMARY
Course Elapsed Days: 8
Plan Fractions Treated to Date: 6
Plan Prescribed Dose Per Fraction: 2.67 Gy
Plan Total Fractions Prescribed: 15
Plan Total Prescribed Dose: 40.05 Gy
Reference Point Dosage Given to Date: 16.02 Gy
Reference Point Session Dosage Given: 2.67 Gy
Session Number: 6

## 2022-03-25 ENCOUNTER — Other Ambulatory Visit: Payer: Self-pay

## 2022-03-25 ENCOUNTER — Ambulatory Visit
Admission: RE | Admit: 2022-03-25 | Discharge: 2022-03-25 | Disposition: A | Discharge status: Home / Self Care | Payer: Medicare Other | Source: Ambulatory Visit | Attending: Radiation Oncology | Admitting: Radiation Oncology

## 2022-03-25 DIAGNOSIS — Z5112 Encounter for antineoplastic immunotherapy: Secondary | ICD-10-CM | POA: Diagnosis not present

## 2022-03-25 LAB — RAD ONC ARIA SESSION SUMMARY
Course Elapsed Days: 9
Plan Fractions Treated to Date: 7
Plan Prescribed Dose Per Fraction: 2.67 Gy
Plan Total Fractions Prescribed: 15
Plan Total Prescribed Dose: 40.05 Gy
Reference Point Dosage Given to Date: 18.69 Gy
Reference Point Session Dosage Given: 2.67 Gy
Session Number: 7

## 2022-03-28 ENCOUNTER — Other Ambulatory Visit: Payer: Self-pay

## 2022-03-28 ENCOUNTER — Ambulatory Visit
Admission: RE | Admit: 2022-03-28 | Discharge: 2022-03-28 | Disposition: A | Discharge status: Home / Self Care | Payer: Medicare Other | Source: Ambulatory Visit | Attending: Radiation Oncology | Admitting: Radiation Oncology

## 2022-03-28 DIAGNOSIS — Z5112 Encounter for antineoplastic immunotherapy: Secondary | ICD-10-CM | POA: Diagnosis not present

## 2022-03-28 LAB — RAD ONC ARIA SESSION SUMMARY
Course Elapsed Days: 12
Plan Fractions Treated to Date: 8
Plan Prescribed Dose Per Fraction: 2.67 Gy
Plan Total Fractions Prescribed: 15
Plan Total Prescribed Dose: 40.05 Gy
Reference Point Dosage Given to Date: 21.36 Gy
Reference Point Session Dosage Given: 2.67 Gy
Session Number: 8

## 2022-03-29 ENCOUNTER — Ambulatory Visit
Admission: RE | Admit: 2022-03-29 | Discharge: 2022-03-29 | Disposition: A | Discharge status: Home / Self Care | Payer: Medicare Other | Source: Ambulatory Visit | Attending: Radiation Oncology | Admitting: Radiation Oncology

## 2022-03-29 ENCOUNTER — Other Ambulatory Visit: Payer: Self-pay

## 2022-03-29 DIAGNOSIS — Z5112 Encounter for antineoplastic immunotherapy: Secondary | ICD-10-CM | POA: Diagnosis not present

## 2022-03-29 DIAGNOSIS — C50412 Malignant neoplasm of upper-outer quadrant of left female breast: Secondary | ICD-10-CM

## 2022-03-29 LAB — RAD ONC ARIA SESSION SUMMARY
Course Elapsed Days: 13
Plan Fractions Treated to Date: 9
Plan Prescribed Dose Per Fraction: 2.67 Gy
Plan Total Fractions Prescribed: 15
Plan Total Prescribed Dose: 40.05 Gy
Reference Point Dosage Given to Date: 24.03 Gy
Reference Point Session Dosage Given: 2.67 Gy
Session Number: 9

## 2022-03-29 NOTE — Assessment & Plan Note (Signed)
09/07/2021:Screening mammogram detected left breast mass by ultrasound 2 o'clock position measured 1.4 cm axilla was negative.  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 adjuvant chemotherapy with Taxol Herceptin weekly x12 followed by Herceptin maintenance every 3 weeks for 1 year 2.  Adjuvant radiation therapy until 04/13/2022 3.  Adjuvant antiestrogen therapy ----------------------------------------------------------------------------------------------------------------------------- Current treatment: Herceptin maintenance therapy Toxicities: None  Return to clinic every 3 weeks for Herceptin and every 6 weeks for follow-up with me

## 2022-03-30 ENCOUNTER — Inpatient Hospital Stay: Payer: Medicare Other | Attending: Hematology and Oncology

## 2022-03-30 ENCOUNTER — Ambulatory Visit
Admission: RE | Admit: 2022-03-30 | Discharge: 2022-03-30 | Disposition: A | Discharge status: Home / Self Care | Payer: Medicare Other | Source: Ambulatory Visit | Attending: Radiation Oncology | Admitting: Radiation Oncology

## 2022-03-30 ENCOUNTER — Inpatient Hospital Stay (HOSPITAL_BASED_OUTPATIENT_CLINIC_OR_DEPARTMENT_OTHER): Payer: Medicare Other | Admitting: Hematology and Oncology

## 2022-03-30 ENCOUNTER — Other Ambulatory Visit: Payer: Self-pay

## 2022-03-30 ENCOUNTER — Inpatient Hospital Stay: Payer: Medicare Other

## 2022-03-30 ENCOUNTER — Other Ambulatory Visit: Payer: Self-pay | Admitting: *Deleted

## 2022-03-30 VITALS — BP 145/70 | HR 88

## 2022-03-30 VITALS — BP 164/74 | HR 81 | Temp 97.5°F | Resp 18 | Ht 64.0 in | Wt 233.6 lb

## 2022-03-30 DIAGNOSIS — C50412 Malignant neoplasm of upper-outer quadrant of left female breast: Secondary | ICD-10-CM | POA: Diagnosis not present

## 2022-03-30 DIAGNOSIS — Z5112 Encounter for antineoplastic immunotherapy: Secondary | ICD-10-CM | POA: Diagnosis not present

## 2022-03-30 DIAGNOSIS — Z95828 Presence of other vascular implants and grafts: Secondary | ICD-10-CM

## 2022-03-30 DIAGNOSIS — Z170 Estrogen receptor positive status [ER+]: Secondary | ICD-10-CM | POA: Diagnosis not present

## 2022-03-30 DIAGNOSIS — Z5181 Encounter for therapeutic drug level monitoring: Secondary | ICD-10-CM

## 2022-03-30 LAB — CMP (CANCER CENTER ONLY)
ALT: 10 U/L (ref 0–44)
AST: 15 U/L (ref 15–41)
Albumin: 3.9 g/dL (ref 3.5–5.0)
Alkaline Phosphatase: 97 U/L (ref 38–126)
Anion gap: 4 — ABNORMAL LOW (ref 5–15)
BUN: 10 mg/dL (ref 8–23)
CO2: 30 mmol/L (ref 22–32)
Calcium: 9.4 mg/dL (ref 8.9–10.3)
Chloride: 107 mmol/L (ref 98–111)
Creatinine: 0.79 mg/dL (ref 0.44–1.00)
GFR, Estimated: >60 mL/min (ref >60)
Glucose, Bld: 88 mg/dL (ref 70–99)
Potassium: 3.9 mmol/L (ref 3.5–5.1)
Sodium: 141 mmol/L (ref 135–145)
Total Bilirubin: 0.4 mg/dL (ref 0.3–1.2)
Total Protein: 6.5 g/dL (ref 6.5–8.1)

## 2022-03-30 LAB — CBC WITH DIFFERENTIAL (CANCER CENTER ONLY)
Abs Immature Granulocytes: 0.01 10*3/uL (ref 0.00–0.07)
Basophils Absolute: 0 10*3/uL (ref 0.0–0.1)
Basophils Relative: 0 %
Eosinophils Absolute: 0.2 10*3/uL (ref 0.0–0.5)
Eosinophils Relative: 3 %
HCT: 32.8 % — ABNORMAL LOW (ref 36.0–46.0)
Hemoglobin: 10.5 g/dL — ABNORMAL LOW (ref 12.0–15.0)
Immature Granulocytes: 0 %
Lymphocytes Relative: 24 %
Lymphs Abs: 1.4 10*3/uL (ref 0.7–4.0)
MCH: 25.7 pg — ABNORMAL LOW (ref 26.0–34.0)
MCHC: 32 g/dL (ref 30.0–36.0)
MCV: 80.4 fL (ref 80.0–100.0)
Monocytes Absolute: 0.4 10*3/uL (ref 0.1–1.0)
Monocytes Relative: 7 %
Neutro Abs: 3.7 10*3/uL (ref 1.7–7.7)
Neutrophils Relative %: 66 %
Platelet Count: 197 10*3/uL (ref 150–400)
RBC: 4.08 MIL/uL (ref 3.87–5.11)
RDW: 14.8 % (ref 11.5–15.5)
WBC Count: 5.6 10*3/uL (ref 4.0–10.5)
nRBC: 0 % (ref 0.0–0.2)

## 2022-03-30 LAB — RAD ONC ARIA SESSION SUMMARY
Course Elapsed Days: 14
Plan Fractions Treated to Date: 10
Plan Prescribed Dose Per Fraction: 2.67 Gy
Plan Total Fractions Prescribed: 15
Plan Total Prescribed Dose: 40.05 Gy
Reference Point Dosage Given to Date: 26.7 Gy
Reference Point Session Dosage Given: 2.67 Gy
Session Number: 10

## 2022-03-30 MED ORDER — DIPHENHYDRAMINE HCL 25 MG PO CAPS
50.0000 mg | ORAL_CAPSULE | Freq: Once | ORAL | Status: AC
  Administered 2022-03-30: 50 mg via ORAL
  Filled 2022-03-30: qty 2

## 2022-03-30 MED ORDER — SODIUM CHLORIDE 0.9% FLUSH
10.0000 mL | Freq: Once | INTRAVENOUS | Status: AC
  Administered 2022-03-30: 10 mL

## 2022-03-30 MED ORDER — ACETAMINOPHEN 325 MG PO TABS
650.0000 mg | ORAL_TABLET | Freq: Once | ORAL | Status: AC
  Administered 2022-03-30: 650 mg via ORAL
  Filled 2022-03-30: qty 2

## 2022-03-30 MED ORDER — HEPARIN SOD (PORK) LOCK FLUSH 100 UNIT/ML IV SOLN
500.0000 [IU] | Freq: Once | INTRAVENOUS | Status: AC | PRN
  Administered 2022-03-30: 500 [IU]

## 2022-03-30 MED ORDER — ANASTROZOLE 1 MG PO TABS: 1.0000 mg | ORAL_TABLET | Freq: Every day | ORAL | 3 refills | Status: DC

## 2022-03-30 MED ORDER — SODIUM CHLORIDE 0.9 % IV SOLN: Freq: Once | INTRAVENOUS | Status: AC

## 2022-03-30 MED ORDER — SODIUM CHLORIDE 0.9% FLUSH
10.0000 mL | INTRAVENOUS | Status: DC | PRN
  Administered 2022-03-30: 10 mL

## 2022-03-30 MED ORDER — TRASTUZUMAB-DKST CHEMO 150 MG IV SOLR
6.0000 mg/kg | Freq: Once | INTRAVENOUS | Status: AC
  Administered 2022-03-30: 600 mg via INTRAVENOUS
  Filled 2022-03-30: qty 28.57

## 2022-03-30 NOTE — Progress Notes (Signed)
Per MD okay to treat today with echo from 12/27/21.  Verbal orders received and placed to repeat echo.

## 2022-03-30 NOTE — Patient Instructions (Signed)
[facility]  Discharge Instructions: Thank you for choosing [facility] to provide your oncology and hematology care.   If you have a lab appointment with the [facility], please go directly to the [facility] and check in at the registration area.   Wear comfortable clothing and clothing appropriate for easy access to any Portacath or PICC line.   We strive to give you quality time with your provider. You may need to reschedule your appointment if you arrive late (15 or more minutes).  Arriving late affects you and other patients whose appointments are after yours.  Also, if you miss three or more appointments without notifying the office, you may be dismissed from the clinic at the provider's discretion.      For prescription refill requests, have your pharmacy contact our office and allow 72 hours for refills to be completed.    Today you received the following chemotherapy and/or immunotherapy agents: Trastuzumab      To help prevent nausea and vomiting after your treatment, we encourage you to take your nausea medication as directed.  BELOW ARE SYMPTOMS THAT SHOULD BE REPORTED IMMEDIATELY: *FEVER GREATER THAN 100.4 F (38 C) OR HIGHER *CHILLS OR SWEATING *NAUSEA AND VOMITING THAT IS NOT CONTROLLED WITH YOUR NAUSEA MEDICATION *UNUSUAL SHORTNESS OF BREATH *UNUSUAL BRUISING OR BLEEDING *URINARY PROBLEMS (pain or burning when urinating, or frequent urination) *BOWEL PROBLEMS (unusual diarrhea, constipation, pain near the anus) TENDERNESS IN MOUTH AND THROAT WITH OR WITHOUT PRESENCE OF ULCERS (sore throat, sores in mouth, or a toothache) UNUSUAL RASH, SWELLING OR PAIN  UNUSUAL VAGINAL DISCHARGE OR ITCHING   Items with * indicate a potential emergency and should be followed up as soon as possible or go to the Emergency Department if any problems should occur.  Please show the CHEMOTHERAPY ALERT CARD or IMMUNOTHERAPY ALERT CARD at check-in  to the Emergency Department and triage nurse.  Should you have questions after your visit or need to cancel or reschedule your appointment, please contact [facility]  Dept: [phone]  and follow the prompts.  Office hours are 8:00 a.m. to 4:30 p.m. Monday - Friday. Please note that voicemails left after 4:00 p.m. may not be returned until the following business day.  We are closed weekends and major holidays. You have access to a nurse at all times for urgent questions. Please call the main number to the clinic Dept: [phone] and follow the prompts.   For any non-urgent questions, you may also contact your provider using MyChart. We now offer e-Visits for anyone 86 and older to request care online for non-urgent symptoms. For details visit [email or web address].   Also download the MyChart app! Go to the app store, search "MyChart", open the app, select [facility], and log in with your MyChart username and password.

## 2022-03-31 ENCOUNTER — Ambulatory Visit
Admission: RE | Admit: 2022-03-31 | Discharge: 2022-03-31 | Disposition: A | Discharge status: Home / Self Care | Payer: Medicare Other | Source: Ambulatory Visit | Attending: Radiation Oncology | Admitting: Radiation Oncology

## 2022-03-31 ENCOUNTER — Other Ambulatory Visit: Payer: Self-pay

## 2022-03-31 DIAGNOSIS — Z5112 Encounter for antineoplastic immunotherapy: Secondary | ICD-10-CM | POA: Diagnosis not present

## 2022-03-31 LAB — RAD ONC ARIA SESSION SUMMARY
Course Elapsed Days: 15
Plan Fractions Treated to Date: 11
Plan Prescribed Dose Per Fraction: 2.67 Gy
Plan Total Fractions Prescribed: 15
Plan Total Prescribed Dose: 40.05 Gy
Reference Point Dosage Given to Date: 29.37 Gy
Reference Point Session Dosage Given: 2.67 Gy
Session Number: 11

## 2022-04-01 ENCOUNTER — Ambulatory Visit
Admission: RE | Admit: 2022-04-01 | Discharge: 2022-04-01 | Disposition: A | Discharge status: Home / Self Care | Payer: Medicare Other | Source: Ambulatory Visit | Attending: Radiation Oncology | Admitting: Radiation Oncology

## 2022-04-01 ENCOUNTER — Other Ambulatory Visit: Payer: Self-pay

## 2022-04-01 DIAGNOSIS — Z5112 Encounter for antineoplastic immunotherapy: Secondary | ICD-10-CM | POA: Diagnosis not present

## 2022-04-01 LAB — RAD ONC ARIA SESSION SUMMARY
Course Elapsed Days: 16
Plan Fractions Treated to Date: 12
Plan Prescribed Dose Per Fraction: 2.67 Gy
Plan Total Fractions Prescribed: 15
Plan Total Prescribed Dose: 40.05 Gy
Reference Point Dosage Given to Date: 32.04 Gy
Reference Point Session Dosage Given: 2.67 Gy
Session Number: 12

## 2022-04-04 ENCOUNTER — Ambulatory Visit
Admission: RE | Admit: 2022-04-04 | Discharge: 2022-04-04 | Disposition: A | Discharge status: Home / Self Care | Payer: Medicare Other | Source: Ambulatory Visit | Attending: Radiation Oncology | Admitting: Radiation Oncology

## 2022-04-04 ENCOUNTER — Other Ambulatory Visit: Payer: Self-pay

## 2022-04-04 ENCOUNTER — Ambulatory Visit: Payer: Medicare Other | Admitting: Radiation Oncology

## 2022-04-04 DIAGNOSIS — Z170 Estrogen receptor positive status [ER+]: Secondary | ICD-10-CM

## 2022-04-04 DIAGNOSIS — Z5112 Encounter for antineoplastic immunotherapy: Secondary | ICD-10-CM | POA: Diagnosis not present

## 2022-04-04 LAB — RAD ONC ARIA SESSION SUMMARY
Course Elapsed Days: 19
Plan Fractions Treated to Date: 13
Plan Prescribed Dose Per Fraction: 2.67 Gy
Plan Total Fractions Prescribed: 15
Plan Total Prescribed Dose: 40.05 Gy
Reference Point Dosage Given to Date: 34.71 Gy
Reference Point Session Dosage Given: 2.67 Gy
Session Number: 13

## 2022-04-04 MED ORDER — RADIAPLEXRX EX GEL
Freq: Once | CUTANEOUS | Status: AC
  Administered 2022-04-04: 1 via TOPICAL

## 2022-04-05 ENCOUNTER — Ambulatory Visit
Admission: RE | Admit: 2022-04-05 | Discharge: 2022-04-05 | Disposition: A | Discharge status: Home / Self Care | Payer: Medicare Other | Source: Ambulatory Visit | Attending: Radiation Oncology | Admitting: Radiation Oncology

## 2022-04-05 ENCOUNTER — Other Ambulatory Visit: Payer: Self-pay

## 2022-04-05 DIAGNOSIS — Z5112 Encounter for antineoplastic immunotherapy: Secondary | ICD-10-CM | POA: Diagnosis not present

## 2022-04-05 LAB — RAD ONC ARIA SESSION SUMMARY
Course Elapsed Days: 20
Plan Fractions Treated to Date: 14
Plan Prescribed Dose Per Fraction: 2.67 Gy
Plan Total Fractions Prescribed: 15
Plan Total Prescribed Dose: 40.05 Gy
Reference Point Dosage Given to Date: 37.38 Gy
Reference Point Session Dosage Given: 2.67 Gy
Session Number: 14

## 2022-04-06 ENCOUNTER — Other Ambulatory Visit: Payer: Self-pay

## 2022-04-06 ENCOUNTER — Ambulatory Visit
Admission: RE | Admit: 2022-04-06 | Discharge: 2022-04-06 | Disposition: A | Discharge status: Home / Self Care | Payer: Medicare Other | Source: Ambulatory Visit | Attending: Radiation Oncology | Admitting: Radiation Oncology

## 2022-04-06 DIAGNOSIS — Z5112 Encounter for antineoplastic immunotherapy: Secondary | ICD-10-CM | POA: Diagnosis not present

## 2022-04-06 LAB — RAD ONC ARIA SESSION SUMMARY
Course Elapsed Days: 21
Plan Fractions Treated to Date: 15
Plan Prescribed Dose Per Fraction: 2.67 Gy
Plan Total Fractions Prescribed: 15
Plan Total Prescribed Dose: 40.05 Gy
Reference Point Dosage Given to Date: 40.05 Gy
Reference Point Session Dosage Given: 2.67 Gy
Session Number: 15

## 2022-04-07 ENCOUNTER — Ambulatory Visit
Admission: RE | Admit: 2022-04-07 | Discharge: 2022-04-07 | Disposition: A | Discharge status: Home / Self Care | Payer: Medicare Other | Source: Ambulatory Visit | Attending: Radiation Oncology | Admitting: Radiation Oncology

## 2022-04-07 ENCOUNTER — Other Ambulatory Visit: Payer: Self-pay

## 2022-04-07 DIAGNOSIS — Z5112 Encounter for antineoplastic immunotherapy: Secondary | ICD-10-CM | POA: Diagnosis not present

## 2022-04-07 LAB — RAD ONC ARIA SESSION SUMMARY
Course Elapsed Days: 22
Plan Fractions Treated to Date: 1
Plan Prescribed Dose Per Fraction: 2 Gy
Plan Total Fractions Prescribed: 5
Plan Total Prescribed Dose: 10 Gy
Reference Point Dosage Given to Date: 2 Gy
Reference Point Session Dosage Given: 2 Gy
Session Number: 16

## 2022-04-08 ENCOUNTER — Other Ambulatory Visit: Payer: Self-pay

## 2022-04-08 ENCOUNTER — Ambulatory Visit
Admission: RE | Admit: 2022-04-08 | Discharge: 2022-04-08 | Disposition: A | Discharge status: Home / Self Care | Payer: Medicare Other | Source: Ambulatory Visit | Attending: Radiation Oncology | Admitting: Radiation Oncology

## 2022-04-08 DIAGNOSIS — Z5112 Encounter for antineoplastic immunotherapy: Secondary | ICD-10-CM | POA: Diagnosis not present

## 2022-04-08 LAB — RAD ONC ARIA SESSION SUMMARY
Course Elapsed Days: 23
Plan Fractions Treated to Date: 2
Plan Prescribed Dose Per Fraction: 2 Gy
Plan Total Fractions Prescribed: 5
Plan Total Prescribed Dose: 10 Gy
Reference Point Dosage Given to Date: 4 Gy
Reference Point Session Dosage Given: 2 Gy
Session Number: 17

## 2022-04-11 ENCOUNTER — Ambulatory Visit (HOSPITAL_COMMUNITY)
Admission: RE | Admit: 2022-04-11 | Discharge: 2022-04-11 | Disposition: A | Discharge status: Home / Self Care | Payer: Medicare Other | Source: Ambulatory Visit | Attending: Hematology and Oncology | Admitting: Hematology and Oncology

## 2022-04-11 ENCOUNTER — Other Ambulatory Visit: Payer: Self-pay

## 2022-04-11 ENCOUNTER — Ambulatory Visit
Admission: RE | Admit: 2022-04-11 | Discharge: 2022-04-11 | Disposition: A | Discharge status: Home / Self Care | Payer: Medicare Other | Source: Ambulatory Visit | Attending: Radiation Oncology | Admitting: Radiation Oncology

## 2022-04-11 DIAGNOSIS — Z0189 Encounter for other specified special examinations: Secondary | ICD-10-CM | POA: Diagnosis not present

## 2022-04-11 DIAGNOSIS — E119 Type 2 diabetes mellitus without complications: Secondary | ICD-10-CM | POA: Diagnosis not present

## 2022-04-11 DIAGNOSIS — I10 Essential (primary) hypertension: Secondary | ICD-10-CM | POA: Insufficient documentation

## 2022-04-11 DIAGNOSIS — Z5181 Encounter for therapeutic drug level monitoring: Secondary | ICD-10-CM | POA: Insufficient documentation

## 2022-04-11 DIAGNOSIS — I351 Nonrheumatic aortic (valve) insufficiency: Secondary | ICD-10-CM | POA: Insufficient documentation

## 2022-04-11 DIAGNOSIS — Z79899 Other long term (current) drug therapy: Secondary | ICD-10-CM | POA: Diagnosis not present

## 2022-04-11 DIAGNOSIS — Z5112 Encounter for antineoplastic immunotherapy: Secondary | ICD-10-CM | POA: Diagnosis not present

## 2022-04-11 LAB — ECHOCARDIOGRAM COMPLETE
AR max vel: 2.06 cm2
AV Area VTI: 2.19 cm2
AV Area mean vel: 1.96 cm2
AV Mean grad: 8 mmHg
AV Peak grad: 14.1 mmHg
Ao pk vel: 1.88 m/s
Area-P 1/2: 3.76 cm2
Calc EF: 59.8 %
P 1/2 time: 456 msec
S' Lateral: 2.9 cm
Single Plane A2C EF: 60 %
Single Plane A4C EF: 59.1 %

## 2022-04-11 LAB — RAD ONC ARIA SESSION SUMMARY
Course Elapsed Days: 26
Plan Fractions Treated to Date: 3
Plan Prescribed Dose Per Fraction: 2 Gy
Plan Total Fractions Prescribed: 5
Plan Total Prescribed Dose: 10 Gy
Reference Point Dosage Given to Date: 6 Gy
Reference Point Session Dosage Given: 2 Gy
Session Number: 18

## 2022-04-11 NOTE — Progress Notes (Signed)
Echocardiogram 2D Echocardiogram has been performed.  Karen Vazquez 04/11/2022, 9:43 AM

## 2022-04-12 ENCOUNTER — Ambulatory Visit
Admission: RE | Admit: 2022-04-12 | Discharge: 2022-04-12 | Disposition: A | Discharge status: Home / Self Care | Payer: Medicare Other | Source: Ambulatory Visit | Attending: Radiation Oncology | Admitting: Radiation Oncology

## 2022-04-12 ENCOUNTER — Other Ambulatory Visit: Payer: Self-pay

## 2022-04-12 DIAGNOSIS — Z5112 Encounter for antineoplastic immunotherapy: Secondary | ICD-10-CM | POA: Diagnosis not present

## 2022-04-12 LAB — RAD ONC ARIA SESSION SUMMARY
Course Elapsed Days: 27
Plan Fractions Treated to Date: 4
Plan Prescribed Dose Per Fraction: 2 Gy
Plan Total Fractions Prescribed: 5
Plan Total Prescribed Dose: 10 Gy
Reference Point Dosage Given to Date: 8 Gy
Reference Point Session Dosage Given: 2 Gy
Session Number: 19

## 2022-04-13 ENCOUNTER — Ambulatory Visit
Admission: RE | Admit: 2022-04-13 | Discharge: 2022-04-13 | Disposition: A | Discharge status: Home / Self Care | Payer: Medicare Other | Source: Ambulatory Visit | Attending: Radiation Oncology | Admitting: Radiation Oncology

## 2022-04-13 ENCOUNTER — Other Ambulatory Visit: Payer: Self-pay

## 2022-04-13 DIAGNOSIS — Z5112 Encounter for antineoplastic immunotherapy: Secondary | ICD-10-CM | POA: Diagnosis not present

## 2022-04-13 LAB — RAD ONC ARIA SESSION SUMMARY
Course Elapsed Days: 28
Plan Fractions Treated to Date: 5
Plan Prescribed Dose Per Fraction: 2 Gy
Plan Total Fractions Prescribed: 5
Plan Total Prescribed Dose: 10 Gy
Reference Point Dosage Given to Date: 10 Gy
Reference Point Session Dosage Given: 2 Gy
Session Number: 20

## 2022-04-14 ENCOUNTER — Encounter: Payer: Self-pay | Admitting: *Deleted

## 2022-04-14 NOTE — Radiation Completion Notes (Signed)
Patient Name: Karen Vazquez, Karen Vazquez MRN: [id number] Date of Birth: [birth year] Referring Physician: Sallee Lange, M.D. Date of Service: 2022-04-14 Radiation Oncologist: Eppie Gibson, M.D. [place]                             RADIATION ONCOLOGY END OF TREATMENT NOTE     Diagnosis: C50.412 Malignant neoplasm of upper-outer quadrant of left female breast Staging on 2021-09-15: Malignant neoplasm of upper-outer quadrant of left breast in female, estrogen receptor positive ([place]) T=cT1b, N=cN0, M=cM0 Intent: Curative     ==========DELIVERED PLANS==========  First Treatment Date: 2022-03-16 - Last Treatment Date: 2022-04-13   Plan Name: Breast_L_BH Site: Breast, Left Technique: 3D Mode: Photon Dose Per Fraction: 2.67 Gy Prescribed Dose (Delivered / Prescribed): 40.05 Gy / 40.05 Gy Prescribed Fxs (Delivered / Prescribed): 15 / 15   Plan Name: Brst_L_Bst_BH Site: Breast, Left Technique: 3D Mode: Photon Dose Per Fraction: 2 Gy Prescribed Dose (Delivered / Prescribed): 10 Gy / 10 Gy Prescribed Fxs (Delivered / Prescribed): 5 / 5     ==========ON TREATMENT VISIT DATES========== 2022-03-22, 2022-03-28, 2022-04-04, 2022-04-11     ==========UPCOMING VISITS==========       ==========APPENDIX - ON TREATMENT VISIT NOTES==========   See weekly On Treatment Notes is Epic for details.

## 2022-04-20 ENCOUNTER — Inpatient Hospital Stay: Payer: Medicare Other

## 2022-04-20 VITALS — BP 153/71 | HR 87 | Temp 98.0°F | Resp 16 | Wt 232.5 lb

## 2022-04-20 DIAGNOSIS — Z170 Estrogen receptor positive status [ER+]: Secondary | ICD-10-CM

## 2022-04-20 DIAGNOSIS — Z5112 Encounter for antineoplastic immunotherapy: Secondary | ICD-10-CM | POA: Diagnosis not present

## 2022-04-20 MED ORDER — ACETAMINOPHEN 325 MG PO TABS
650.0000 mg | ORAL_TABLET | Freq: Once | ORAL | Status: AC
  Administered 2022-04-20: 650 mg via ORAL
  Filled 2022-04-20: qty 2

## 2022-04-20 MED ORDER — SODIUM CHLORIDE 0.9% FLUSH
10.0000 mL | INTRAVENOUS | Status: DC | PRN
  Administered 2022-04-20: 10 mL

## 2022-04-20 MED ORDER — TRASTUZUMAB-DKST CHEMO 150 MG IV SOLR
6.0000 mg/kg | Freq: Once | INTRAVENOUS | Status: AC
  Administered 2022-04-20: 600 mg via INTRAVENOUS
  Filled 2022-04-20: qty 28.57

## 2022-04-20 MED ORDER — HEPARIN SOD (PORK) LOCK FLUSH 100 UNIT/ML IV SOLN
500.0000 [IU] | Freq: Once | INTRAVENOUS | Status: AC | PRN
  Administered 2022-04-20: 500 [IU]

## 2022-04-20 MED ORDER — SODIUM CHLORIDE 0.9 % IV SOLN: Freq: Once | INTRAVENOUS | Status: AC

## 2022-04-20 MED ORDER — DIPHENHYDRAMINE HCL 25 MG PO CAPS
50.0000 mg | ORAL_CAPSULE | Freq: Once | ORAL | Status: AC
  Administered 2022-04-20: 50 mg via ORAL
  Filled 2022-04-20: qty 2

## 2022-04-20 NOTE — Patient Instructions (Signed)
[facility]  Discharge Instructions: Thank you for choosing [facility] to provide your oncology and hematology care.   If you have a lab appointment with the Cancer Center, please go directly to the Cancer Center and check in at the registration area.   Wear comfortable clothing and clothing appropriate for easy access to any Portacath or PICC line.   We strive to give you quality time with your provider. You may need to reschedule your appointment if you arrive late (15 or more minutes).  Arriving late affects you and other patients whose appointments are after yours.  Also, if you miss three or more appointments without notifying the office, you may be dismissed from the clinic at the provider's discretion.      For prescription refill requests, have your pharmacy contact our office and allow 72 hours for refills to be completed.    Today you received the following chemotherapy and/or immunotherapy agents: trastuzumab-dkst      To help prevent nausea and vomiting after your treatment, we encourage you to take your nausea medication as directed.  BELOW ARE SYMPTOMS THAT SHOULD BE REPORTED IMMEDIATELY: *FEVER GREATER THAN 100.4 F (38 C) OR HIGHER *CHILLS OR SWEATING *NAUSEA AND VOMITING THAT IS NOT CONTROLLED WITH YOUR NAUSEA MEDICATION *UNUSUAL SHORTNESS OF BREATH *UNUSUAL BRUISING OR BLEEDING *URINARY PROBLEMS (pain or burning when urinating, or frequent urination) *BOWEL PROBLEMS (unusual diarrhea, constipation, pain near the anus) TENDERNESS IN MOUTH AND THROAT WITH OR WITHOUT PRESENCE OF ULCERS (sore throat, sores in mouth, or a toothache) UNUSUAL RASH, SWELLING OR PAIN  UNUSUAL VAGINAL DISCHARGE OR ITCHING   Items with * indicate a potential emergency and should be followed up as soon as possible or go to the Emergency Department if any problems should occur.  Please show the CHEMOTHERAPY ALERT CARD or IMMUNOTHERAPY ALERT CARD  at check-in to the Emergency Department and triage nurse.  Should you have questions after your visit or need to cancel or reschedule your appointment, please contact [facility]  Dept: [phone]  and follow the prompts.  Office hours are 8:00 a.m. to 4:30 p.m. Monday - Friday. Please note that voicemails left after 4:00 p.m. may not be returned until the following business day.  We are closed weekends and major holidays. You have access to a nurse at all times for urgent questions. Please call the main number to the clinic Dept: [phone] and follow the prompts.   For any non-urgent questions, you may also contact your provider using MyChart. We now offer e-Visits for anyone 18 and older to request care online for non-urgent symptoms. For details visit [email or web address].   Also download the MyChart app! Go to the app store, search "MyChart", open the app, select [facility], and log in with your MyChart username and password.   

## 2022-04-22 ENCOUNTER — Telehealth: Payer: Self-pay | Admitting: Hematology and Oncology

## 2022-04-22 NOTE — Telephone Encounter (Signed)
Scheduled appointments per provider (labs q6 weeks). Patient is aware of the made appointments.

## 2022-05-12 ENCOUNTER — Inpatient Hospital Stay: Payer: Medicare Other

## 2022-05-12 ENCOUNTER — Inpatient Hospital Stay (HOSPITAL_BASED_OUTPATIENT_CLINIC_OR_DEPARTMENT_OTHER): Payer: Medicare Other | Admitting: Adult Health

## 2022-05-12 ENCOUNTER — Ambulatory Visit: Payer: Medicare Other | Admitting: Radiation Oncology

## 2022-05-12 ENCOUNTER — Inpatient Hospital Stay: Payer: Medicare Other | Attending: Hematology and Oncology

## 2022-05-12 ENCOUNTER — Encounter: Payer: Self-pay | Admitting: Adult Health

## 2022-05-12 ENCOUNTER — Telehealth: Payer: Self-pay

## 2022-05-12 VITALS — BP 128/77 | HR 77 | Temp 97.7°F | Resp 18 | Ht 64.0 in | Wt 232.7 lb

## 2022-05-12 VITALS — BP 148/84 | HR 79 | Resp 17

## 2022-05-12 DIAGNOSIS — Z5189 Encounter for other specified aftercare: Secondary | ICD-10-CM | POA: Insufficient documentation

## 2022-05-12 DIAGNOSIS — E039 Hypothyroidism, unspecified: Secondary | ICD-10-CM | POA: Diagnosis not present

## 2022-05-12 DIAGNOSIS — C50412 Malignant neoplasm of upper-outer quadrant of left female breast: Secondary | ICD-10-CM | POA: Diagnosis not present

## 2022-05-12 DIAGNOSIS — K219 Gastro-esophageal reflux disease without esophagitis: Secondary | ICD-10-CM | POA: Diagnosis not present

## 2022-05-12 DIAGNOSIS — G4730 Sleep apnea, unspecified: Secondary | ICD-10-CM | POA: Insufficient documentation

## 2022-05-12 DIAGNOSIS — Z800 Family history of malignant neoplasm of digestive organs: Secondary | ICD-10-CM | POA: Insufficient documentation

## 2022-05-12 DIAGNOSIS — M8580 Other specified disorders of bone density and structure, unspecified site: Secondary | ICD-10-CM | POA: Insufficient documentation

## 2022-05-12 DIAGNOSIS — I10 Essential (primary) hypertension: Secondary | ICD-10-CM | POA: Diagnosis not present

## 2022-05-12 DIAGNOSIS — Z79890 Hormone replacement therapy (postmenopausal): Secondary | ICD-10-CM | POA: Insufficient documentation

## 2022-05-12 DIAGNOSIS — Z79899 Other long term (current) drug therapy: Secondary | ICD-10-CM | POA: Insufficient documentation

## 2022-05-12 DIAGNOSIS — Z79811 Long term (current) use of aromatase inhibitors: Secondary | ICD-10-CM | POA: Insufficient documentation

## 2022-05-12 DIAGNOSIS — Z170 Estrogen receptor positive status [ER+]: Secondary | ICD-10-CM

## 2022-05-12 DIAGNOSIS — R232 Flushing: Secondary | ICD-10-CM | POA: Diagnosis not present

## 2022-05-12 DIAGNOSIS — Z8042 Family history of malignant neoplasm of prostate: Secondary | ICD-10-CM | POA: Insufficient documentation

## 2022-05-12 DIAGNOSIS — G2581 Restless legs syndrome: Secondary | ICD-10-CM | POA: Diagnosis not present

## 2022-05-12 DIAGNOSIS — R531 Weakness: Secondary | ICD-10-CM | POA: Diagnosis not present

## 2022-05-12 DIAGNOSIS — E119 Type 2 diabetes mellitus without complications: Secondary | ICD-10-CM | POA: Diagnosis not present

## 2022-05-12 DIAGNOSIS — Z95828 Presence of other vascular implants and grafts: Secondary | ICD-10-CM

## 2022-05-12 DIAGNOSIS — Z803 Family history of malignant neoplasm of breast: Secondary | ICD-10-CM | POA: Diagnosis not present

## 2022-05-12 DIAGNOSIS — R197 Diarrhea, unspecified: Secondary | ICD-10-CM | POA: Diagnosis not present

## 2022-05-12 LAB — CBC WITH DIFFERENTIAL (CANCER CENTER ONLY)
Abs Immature Granulocytes: 0.01 10*3/uL (ref 0.00–0.07)
Basophils Absolute: 0 10*3/uL (ref 0.0–0.1)
Basophils Relative: 0 %
Eosinophils Absolute: 0.2 10*3/uL (ref 0.0–0.5)
Eosinophils Relative: 4 %
HCT: 34.2 % — ABNORMAL LOW (ref 36.0–46.0)
Hemoglobin: 10.6 g/dL — ABNORMAL LOW (ref 12.0–15.0)
Immature Granulocytes: 0 %
Lymphocytes Relative: 30 %
Lymphs Abs: 1.2 10*3/uL (ref 0.7–4.0)
MCH: 23.1 pg — ABNORMAL LOW (ref 26.0–34.0)
MCHC: 31 g/dL (ref 30.0–36.0)
MCV: 74.7 fL — ABNORMAL LOW (ref 80.0–100.0)
Monocytes Absolute: 0.3 10*3/uL (ref 0.1–1.0)
Monocytes Relative: 8 %
Neutro Abs: 2.3 10*3/uL (ref 1.7–7.7)
Neutrophils Relative %: 58 %
Platelet Count: 159 10*3/uL (ref 150–400)
RBC: 4.58 MIL/uL (ref 3.87–5.11)
RDW: 15.6 % — ABNORMAL HIGH (ref 11.5–15.5)
WBC Count: 4 10*3/uL (ref 4.0–10.5)
nRBC: 0 % (ref 0.0–0.2)

## 2022-05-12 LAB — CMP (CANCER CENTER ONLY)
ALT: 9 U/L (ref 0–44)
AST: 14 U/L — ABNORMAL LOW (ref 15–41)
Albumin: 3.9 g/dL (ref 3.5–5.0)
Alkaline Phosphatase: 111 U/L (ref 38–126)
Anion gap: 5 (ref 5–15)
BUN: 15 mg/dL (ref 8–23)
CO2: 29 mmol/L (ref 22–32)
Calcium: 9 mg/dL (ref 8.9–10.3)
Chloride: 106 mmol/L (ref 98–111)
Creatinine: 0.78 mg/dL (ref 0.44–1.00)
GFR, Estimated: >60 mL/min (ref >60)
Glucose, Bld: 122 mg/dL — ABNORMAL HIGH (ref 70–99)
Potassium: 3.9 mmol/L (ref 3.5–5.1)
Sodium: 140 mmol/L (ref 135–145)
Total Bilirubin: 0.4 mg/dL (ref 0.3–1.2)
Total Protein: 6.5 g/dL (ref 6.5–8.1)

## 2022-05-12 MED ORDER — TRASTUZUMAB-DKST CHEMO 150 MG IV SOLR
6.0000 mg/kg | Freq: Once | INTRAVENOUS | Status: AC
  Administered 2022-05-12: 600 mg via INTRAVENOUS
  Filled 2022-05-12: qty 28.57

## 2022-05-12 MED ORDER — SODIUM CHLORIDE 0.9% FLUSH
10.0000 mL | Freq: Once | INTRAVENOUS | Status: AC
  Administered 2022-05-12: 10 mL

## 2022-05-12 MED ORDER — ACETAMINOPHEN 325 MG PO TABS
650.0000 mg | ORAL_TABLET | Freq: Once | ORAL | Status: AC
  Administered 2022-05-12: 650 mg via ORAL
  Filled 2022-05-12: qty 2

## 2022-05-12 MED ORDER — SODIUM CHLORIDE 0.9 % IV SOLN: Freq: Once | INTRAVENOUS | Status: AC

## 2022-05-12 MED ORDER — SODIUM CHLORIDE 0.9% FLUSH
10.0000 mL | INTRAVENOUS | Status: DC | PRN
  Administered 2022-05-12: 10 mL

## 2022-05-12 MED ORDER — HEPARIN SOD (PORK) LOCK FLUSH 100 UNIT/ML IV SOLN
500.0000 [IU] | Freq: Once | INTRAVENOUS | Status: AC | PRN
  Administered 2022-05-12: 500 [IU]

## 2022-05-12 MED ORDER — DIPHENHYDRAMINE HCL 25 MG PO CAPS
50.0000 mg | ORAL_CAPSULE | Freq: Once | ORAL | Status: AC
  Administered 2022-05-12: 50 mg via ORAL
  Filled 2022-05-12: qty 2

## 2022-05-12 NOTE — Patient Instructions (Signed)
Low-FODMAP Eating Plan  FODMAP stands for fermentable oligosaccharides, disaccharides, monosaccharides, and polyols. These are sugars that are hard for some people to digest. A low-FODMAP eating plan may help some people who have irritable bowel syndrome (IBS) and certain other bowel (intestinal) diseases to manage their symptoms. This meal plan can be complicated to follow. Work with a diet and nutrition specialist (dietitian) to make a low-FODMAP eating plan that is right for you. A dietitian can help make sure that you get enough nutrition from this diet. What are tips for following this plan? Reading food labels Check labels for hidden FODMAPs such as: High-fructose syrup. Honey. Agave. Natural fruit flavors. Onion or garlic powder. Choose low-FODMAP foods that contain 3-4 grams of fiber per serving. Check food labels for serving sizes. Eat only one serving at a time to make sure FODMAP levels stay low. Shopping Shop with a list of foods that are recommended on this diet and make a meal plan. Meal planning Follow a low-FODMAP eating plan for up to 6 weeks, or as told by your health care provider or dietitian. To follow the eating plan: Eliminate high-FODMAP foods from your diet completely. Choose only low-FODMAP foods to eat. You will do this for 2-6 weeks. Gradually reintroduce high-FODMAP foods into your diet one at a time. Most people should wait a few days before introducing the next new high-FODMAP food into their meal plan. Your dietitian can recommend how quickly you may reintroduce foods. Keep a daily record of what and how much you eat and drink. Make note of any symptoms that you have after eating. Review your daily record with a dietitian regularly to identify which foods you can eat and which foods you should avoid. General tips Drink enough fluid each day to keep your urine pale yellow. Avoid processed foods. These often have added sugar and may be high in FODMAPs. Avoid  most dairy products, whole grains, and sweeteners. Work with a dietitian to make sure you get enough fiber in your diet. Avoid high FODMAP foods at meals to manage symptoms. Recommended foods Fruits Bananas, oranges, tangerines, lemons, limes, blueberries, raspberries, strawberries, grapes, cantaloupe, honeydew melon, kiwi, papaya, passion fruit, and pineapple. Limited amounts of dried cranberries, banana chips, and shredded coconut. Vegetables Eggplant, zucchini, cucumber, peppers, green beans, bean sprouts, lettuce, arugula, kale, Swiss chard, spinach, collard greens, bok choy, summer squash, potato, and tomato. Limited amounts of corn, carrot, and sweet potato. Green parts of scallions. Grains Gluten-free grains, such as rice, oats, buckwheat, quinoa, corn, polenta, and millet. Gluten-free pasta, bread, or cereal. Rice noodles. Corn tortillas. Meats and other proteins Unseasoned beef, pork, poultry, or fish. Eggs. Bacon. Tofu (firm) and tempeh. Limited amounts of nuts and seeds, such as almonds, walnuts, brazil nuts, pecans, peanuts, nut butters, pumpkin seeds, chia seeds, and sunflower seeds. Dairy Lactose-free milk, yogurt, and kefir. Lactose-free cottage cheese and ice cream. Non-dairy milks, such as almond, coconut, hemp, and rice milk. Non-dairy yogurt. Limited amounts of goat cheese, brie, mozzarella, parmesan, swiss, and other hard cheeses. Fats and oils Butter-free spreads. Vegetable oils, such as olive, canola, and sunflower oil. Seasoning and other foods Artificial sweeteners with names that do not end in "ol," such as aspartame, saccharine, and stevia. Maple syrup, white table sugar, raw sugar, brown sugar, and molasses. Mayonnaise, soy sauce, and tamari. Fresh basil, coriander, parsley, rosemary, and thyme. Beverages Water and mineral water. Sugar-sweetened soft drinks. Small amounts of orange juice or cranberry juice. Black and green tea. Most dry wines.   Coffee. The items listed  above may not be a complete list of foods and beverages you can eat. Contact a dietitian for more information. Foods to avoid Fruits Fresh, dried, and juiced forms of apple, pear, watermelon, peach, plum, cherries, apricots, blackberries, boysenberries, figs, nectarines, and mango. Avocado. Vegetables Chicory root, artichoke, asparagus, cabbage, snow peas, Brussels sprouts, broccoli, sugar snap peas, mushrooms, celery, and cauliflower. Onions, garlic, leeks, and the white part of scallions. Grains Wheat, including kamut, durum, and semolina. Barley and bulgur. Couscous. Wheat-based cereals. Wheat noodles, bread, crackers, and pastries. Meats and other proteins Fried or fatty meat. Sausage. Cashews and pistachios. Soybeans, baked beans, black beans, chickpeas, kidney beans, fava beans, navy beans, lentils, black-eyed peas, and split peas. Dairy Milk, yogurt, ice cream, and soft cheese. Cream and sour cream. Milk-based sauces. Custard. Buttermilk. Soy milk. Seasoning and other foods Any sugar-free gum or candy. Foods that contain artificial sweeteners such as sorbitol, mannitol, isomalt, or xylitol. Foods that contain honey, high-fructose corn syrup, or agave. Bouillon, vegetable stock, beef stock, and chicken stock. Garlic and onion powder. Condiments made with onion, such as hummus, chutney, pickles, relish, salad dressing, and salsa. Tomato paste. Beverages Chicory-based drinks. Coffee substitutes. Chamomile tea. Fennel tea. Sweet or fortified wines such as port or sherry. Diet soft drinks made with isomalt, mannitol, maltitol, sorbitol, or xylitol. Apple, pear, and mango juice. Juices with high-fructose corn syrup. The items listed above may not be a complete list of foods and beverages you should avoid. Contact a dietitian for more information. Summary FODMAP stands for fermentable oligosaccharides, disaccharides, monosaccharides, and polyols. These are sugars that are hard for some people to  digest. A low-FODMAP eating plan is a short-term diet that helps to ease symptoms of certain bowel diseases. The eating plan usually lasts up to 6 weeks. After that, high-FODMAP foods are reintroduced gradually and one at a time. This can help you find out which foods may be causing symptoms. A low-FODMAP eating plan can be complicated. It is best to work with a dietitian who has experience with this type of plan. This information is not intended to replace advice given to you by your health care provider. Make sure you discuss any questions you have with your health care provider. Document Revised: 07/25/2019 Document Reviewed: 07/25/2019 Elsevier Patient Education  2023 [employer]  

## 2022-05-12 NOTE — Patient Instructions (Signed)
[facility]  Discharge Instructions: Thank you for choosing [facility] to provide your oncology and hematology care.   If you have a lab appointment with the [facility], please go directly to the [facility] and check in at the registration area.   Wear comfortable clothing and clothing appropriate for easy access to any Portacath or PICC line.   We strive to give you quality time with your provider. You may need to reschedule your appointment if you arrive late (15 or more minutes).  Arriving late affects you and other patients whose appointments are after yours.  Also, if you miss three or more appointments without notifying the office, you may be dismissed from the clinic at the provider's discretion.      For prescription refill requests, have your pharmacy contact our office and allow 72 hours for refills to be completed.    Today you received the following chemotherapy and/or immunotherapy agents: trastuzumab-dkst      To help prevent nausea and vomiting after your treatment, we encourage you to take your nausea medication as directed.  BELOW ARE SYMPTOMS THAT SHOULD BE REPORTED IMMEDIATELY: *FEVER GREATER THAN 100.4 F (38 C) OR HIGHER *CHILLS OR SWEATING *NAUSEA AND VOMITING THAT IS NOT CONTROLLED WITH YOUR NAUSEA MEDICATION *UNUSUAL SHORTNESS OF BREATH *UNUSUAL BRUISING OR BLEEDING *URINARY PROBLEMS (pain or burning when urinating, or frequent urination) *BOWEL PROBLEMS (unusual diarrhea, constipation, pain near the anus) TENDERNESS IN MOUTH AND THROAT WITH OR WITHOUT PRESENCE OF ULCERS (sore throat, sores in mouth, or a toothache) UNUSUAL RASH, SWELLING OR PAIN  UNUSUAL VAGINAL DISCHARGE OR ITCHING   Items with * indicate a potential emergency and should be followed up as soon as possible or go to the Emergency Department if any problems should occur.  Please show the CHEMOTHERAPY ALERT CARD or IMMUNOTHERAPY ALERT CARD  at check-in to the Emergency Department and triage nurse.  Should you have questions after your visit or need to cancel or reschedule your appointment, please contact [facility]  Dept: [phone]  and follow the prompts.  Office hours are 8:00 a.m. to 4:30 p.m. Monday - Friday. Please note that voicemails left after 4:00 p.m. may not be returned until the following business day.  We are closed weekends and major holidays. You have access to a nurse at all times for urgent questions. Please call the main number to the clinic Dept: [phone] and follow the prompts.   For any non-urgent questions, you may also contact your provider using MyChart. We now offer e-Visits for anyone 20 and older to request care online for non-urgent symptoms. For details visit [email or web address].   Also download the MyChart app! Go to the app store, search "MyChart", open the app, select , and log in with your MyChart username and password.

## 2022-05-12 NOTE — Telephone Encounter (Signed)
Patient currently in the infusion suite for her systemic treatment. I offered a phone assessment for her 35-monthradiation follow-up, and she accepted  The patient denies any symptomatic concerns.  She does have some lingering fatigue, but feels it's manageable with an afternoon nap and she's looking forward to resuming outdoor activities once the weather warms up. She denies any left arm/shoulder discomfort or range of motion limitations, and reports she is scheduled for another [facility] evaluation with PT next week. Specifically, she reports good healing of her skin in the radiation fields.  Skin is intact and almost completely back to baseline texture/color. I recommended that she continue skin care by applying oil or lotion with vitamin E to the skin in the radiation fields, BID, for 2 more months.    Continue follow-up with medical oncology. She was seen today by LAnnabelle Harmanprior to her infusion appointment, and will see Dr. GLindi Adiein April. I explained that yearly mammograms are important for patients with intact breast tissue, and physical exams are important after mastectomy for patients that cannot undergo mammography.  I encouraged her to call if she had further questions or concerns about her healing. Otherwise, she will follow-up PRN in radiation oncology. Patient is pleased with this plan, and verbalized appreciation of Dr. SPearlie Oystercare.

## 2022-05-12 NOTE — Progress Notes (Signed)
SURVIVORSHIP VISIT:  BRIEF ONCOLOGIC HISTORY:  Oncology History  Malignant neoplasm of upper-outer quadrant of left breast in female, estrogen receptor positive ([place])  09/07/2021 Initial Diagnosis   Screening mammogram detected left breast mass by ultrasound 2 o'clock position measured 1.4 cm axilla was negative.  Ultrasound-guided biopsy revealed grade 2 IDC with focal necrosis ER 90% weak, PR 0%, Ki-67 55%, HER2 3+ positive   09/15/2021 Cancer Staging   Staging form: Breast, AJCC 8th Edition - Clinical: Stage IA (cT1b, cN0, cM0, G3, ER-, PR-, HER2+) - Signed by Eppie Gibson, MD on 03/22/2022 Stage prefix: Initial diagnosis Histologic grading system: 3 grade system    Genetic Testing   [facility] CustomNext Panel was Negative. Report date is 10/04/2021.  The CustomNext-Cancer+RNAinsight panel offered by [facility] includes sequencing and rearrangement analysis for the following 47 genes:  APC, ATM, AXIN2, BARD1, BMPR1A, BRCA1, BRCA2, BRIP1, CDH1, CDK4, CDKN2A, CHEK2, CTNNA1, DICER1, EPCAM, GREM1, HOXB13, KIT, MEN1, MLH1, MSH2, MSH3, MSH6, MUTYH, NBN, NF1, NTHL1, PALB2, PDGFRA, PMS2, POLD1, POLE, PTEN, RAD50, RAD51C, RAD51D, SDHA, SDHB, SDHC, SDHD, SMAD4, SMARCA4, STK11, TP53, TSC1, TSC2, and VHL.  RNA data is routinely analyzed for use in variant interpretation for all genes.   11/25/2021 -  Chemotherapy   Patient is on Treatment Plan : BREAST Paclitaxel + Trastuzumab q7d / Trastuzumab q21d     03/16/2022 - 04/13/2022 Radiation Therapy   40.05 Gy in 15 treatments "Boost": 10 Gy in 5 treatments   03/2022 -  Anti-estrogen oral therapy   Anastrozole x 7 years     INTERVAL HISTORY:  Ms. Guggisberg to review her survivorship care plan detailing her treatment course for breast cancer, as well as monitoring long-term side effects of that treatment, education regarding health maintenance, screening, and overall wellness and health promotion.     Overall, Ms. Mcbain reports feeling quite well.  She is  taking anastrozole and has been since 05/01/2022.  She took her first dose without food which made her nauseated.  She has mild hot flashes, but these are tolerable.    She has continued to experience loose stools 5 out of 7 days.  These are looser than normal, but not watery.  This is impacted with food intake.  She denies nausea.  She has lomotil and wonders what to do moving forward.    REVIEW OF SYSTEMS:  Review of Systems  Constitutional:  Negative for appetite change, chills, fatigue, fever and unexpected weight change.  HENT:   Negative for hearing loss, lump/mass and trouble swallowing.   Eyes:  Negative for eye problems and icterus.  Respiratory:  Negative for chest tightness, cough and shortness of breath.   Cardiovascular:  Negative for chest pain, leg swelling and palpitations.  Gastrointestinal:  Negative for abdominal distention, abdominal pain, constipation, diarrhea, nausea and vomiting.  Endocrine: Positive for hot flashes.  Genitourinary:  Negative for difficulty urinating.   Musculoskeletal:  Negative for arthralgias.  Skin:  Negative for itching and rash.  Neurological:  Negative for dizziness, extremity weakness, headaches and numbness.  Hematological:  Negative for adenopathy. Does not bruise/bleed easily.  Psychiatric/Behavioral:  Negative for depression. The patient is not nervous/anxious.   Breast: Denies any new nodularity, masses, tenderness, nipple changes, or nipple discharge.       PAST MEDICAL/SURGICAL HISTORY:  Past Medical History:  Diagnosis Date   Anemia    Anxiety    Arthritis    Back pain    Blood transfusion 2011   at [facility]   Bulging  lumbar disc    Chest pain    Diabetes mellitus without complication (HCC)    Dry mouth    Easy bruising    Excessive thirst    Fatigue    Frequent urination    GERD (gastroesophageal reflux disease)    Headache    Heat intolerance    Hypertension    Hypothyroidism    Joint pain    Leg pain    Muscle  stiffness    Nervousness    Palpitations    Pre-diabetes    Restless leg syndrome    Shortness of breath on exertion    Sleep apnea    does not use c-pap machine   Stomach ulcer    Stress    Swelling of both lower extremities    Trouble in sleeping    Vitamin D deficiency    Weakness    Past Surgical History:  Procedure Laterality Date   APPENDECTOMY     BIOPSY  05/16/2019   Procedure: BIOPSY;  Surgeon: Rogene Houston, MD;  [facility];  Service: Endoscopy;;  duodenum antral   BLADDER SUSPENSION  12/29/2010   Procedure: TRANSVAGINAL TAPE (TVT) PROCEDURE;  Surgeon: Daria Pastures;  Location: [facility];  Service: Gynecology;  Laterality: N/A;   BREAST LUMPECTOMY WITH RADIOACTIVE SEED AND SENTINEL LYMPH NODE BIOPSY Left 10/26/2021   Procedure: LEFT BREAST SEED LUMPECTOMY WITH LEFT SENTINEL LYMPH NODE MAPPING;  Surgeon: Erroll Luna, MD;  Location: [facility];  Service: General;  Laterality: Left;   BREAST SURGERY     left breast lumpectomy 1977   CATARACT EXTRACTION     COLONOSCOPY N/A 03/03/2016   Procedure: COLONOSCOPY;  Surgeon: Rogene Houston, MD;  [facility];  Service: Endoscopy;  Laterality: N/A;  2:00 - moved to 12/14 @ 12:55 - Ann notified pt   CYSTOCELE REPAIR  12/29/2010   Procedure: ANTERIOR REPAIR (CYSTOCELE);  Surgeon: Daria Pastures;  Location: [facility];  Service: Gynecology;  Laterality: N/A;   CYSTOSCOPY  12/29/2010   Procedure: CYSTOSCOPY;  Surgeon: Daria Pastures;  Location: [facility];  Service: Gynecology;  Laterality: N/A;   ESOPHAGOGASTRODUODENOSCOPY N/A 05/16/2019   Procedure: ESOPHAGOGASTRODUODENOSCOPY (EGD);  Surgeon: Rogene Houston, MD;  [facility];  Service: Endoscopy;  Laterality: N/A;   JOINT REPLACEMENT  2011   rt knee   PORTACATH PLACEMENT N/A 10/26/2021   Procedure: PORT PLACEMENT WITH ULTRASOUND GUIDANCE;  Surgeon: Erroll Luna, MD;  Location: [facility];  Service: General;   Laterality: N/A;   REPLACEMENT TOTAL KNEE Right 2011   TONSILLECTOMY     TOTAL KNEE ARTHROPLASTY Left 11/17/2014   Procedure: LEFT TOTAL KNEE ARTHROPLASTY;  Surgeon: Gaynelle Arabian, MD;  Location: [facility];  Service: Orthopedics;  Laterality: Left;   TUBAL LIGATION     VAGINAL HYSTERECTOMY  12/29/2010   Procedure: HYSTERECTOMY VAGINAL;  Surgeon: Daria Pastures;  Location: [facility];  Service: Gynecology;  Laterality: N/A;     ALLERGIES:  Allergies  Allergen Reactions   Lisinopril Cough     CURRENT MEDICATIONS:  Outpatient Encounter Medications as of 05/12/2022  Medication Sig   ALPRAZolam (XANAX) 0.5 MG tablet TAKE ONE TABLET BY MOUTH TWO TIMES DAILYAS NEEDED FOR ANXIETY OR SLEEP.   amLODipine (NORVASC) 2.5 MG tablet Take 1 tablet (2.5 mg total) by mouth daily.   anastrozole (ARIMIDEX) 1 MG tablet Take 1 tablet (1 mg total) by mouth daily.   ARMOUR THYROID 15 MG  tablet TAKE ONE TABLET ONCE DAILY   diphenoxylate-atropine (LOMOTIL) 2.5-0.025 MG tablet Take 1 tablet by mouth 4 (four) times daily as needed for diarrhea or loose stools.   lidocaine-prilocaine (EMLA) cream Apply to affected area once   oxybutynin (DITROPAN-XL) 5 MG 24 hr tablet Take by mouth.   pantoprazole (PROTONIX) 40 MG tablet Take 1 tablet (40 mg total) by mouth daily.   pregabalin (LYRICA) 50 MG capsule 1 each evening   thyroid (ARMOUR THYROID) 30 MG tablet TAKE ONE (1) TABLET BY MOUTH EVERY DAY   [EXPIRED] sodium chloride flush (NS) 0.9 % injection 10 mL    No facility-administered encounter medications on file as of 05/12/2022.     ONCOLOGIC FAMILY HISTORY:  Family History  Problem Relation Age of Onset   Stroke Mother    Hypertension Mother    Hyperlipidemia Mother    Thyroid disease Mother    Heart disease Father    COPD Father    Depression Father    Prostate cancer Father 52 - 53   Breast cancer Paternal Grandmother 22 - 59   Diabetes Paternal Grandfather    Colon cancer Cousin       SOCIAL  HISTORY:  Social History   Socioeconomic History   Marital status: Married    Spouse name: Jori Moll   Number of children: Not on file   Years of education: Not on file   Highest education level: Not on file  Occupational History   Occupation: Retired  Tobacco Use   Smoking status: Never   Smokeless tobacco: Never  Vaping Use   Vaping Use: Never used  Substance and Sexual Activity   Alcohol use: No   Drug use: No   Sexual activity: Yes    Birth control/protection: Post-menopausal, Surgical    Comment: HYST  Other Topics Concern   Not on file  Social History Narrative   Not on file   Social Determinants of Health   Financial Resource Strain: Low Risk  (08/10/2021)   Overall Financial Resource Strain (CARDIA)    Difficulty of Paying Living Expenses: Not very hard  Food Insecurity: No Food Insecurity (08/10/2021)   Hunger Vital Sign    Worried About Running Out of Food in the Last Year: Never true    Ran Out of Food in the Last Year: Never true  Transportation Needs: No Transportation Needs (08/10/2021)   PRAPARE - Hydrologist (Medical): No    Lack of Transportation (Non-Medical): No  Physical Activity: Insufficiently Active (08/10/2021)   Exercise Vital Sign    Days of Exercise per Week: 3 days    Minutes of Exercise per Session: 30 min  Stress: Stress Concern Present (08/10/2021)   [place]    Feeling of Stress : To some extent  Social Connections: Socially Integrated (08/10/2021)   Social Connection and Isolation Panel [NHANES]    Frequency of Communication with Friends and Family: More than three times a week    Frequency of Social Gatherings with Friends and Family: More than three times a week    Attends Religious Services: More than 4 times per year    Active Member of [employer] or Organizations: Yes    Attends Archivist Meetings: More than 4 times per year     Marital Status: Married  Human resources officer Violence: Not At Risk (08/10/2021)   Humiliation, Afraid, Rape, and Kick questionnaire    Fear of Current or  Ex-Partner: No    Emotionally Abused: No    Physically Abused: No    Sexually Abused: No     OBSERVATIONS/OBJECTIVE:  BP 128/77 (BP Location: Left Arm, Patient Position: Sitting)   Pulse 77   Temp 97.7 F (36.5 C) (Temporal)   Resp 18   Ht 5' 4"$  (1.626 m)   Wt 232 lb 11.2 oz (105.6 kg)   LMP 05/24/2010   SpO2 100%   BMI 39.94 kg/m  GENERAL: Patient is a well appearing female in no acute distress HEENT:  Sclerae anicteric.  Oropharynx clear and moist. No ulcerations or evidence of oropharyngeal candidiasis. Neck is supple.  NODES:  No cervical, supraclavicular, or axillary lymphadenopathy palpated.  BREAST EXAM: Left breast status postlumpectomy and radiation no sign of local recurrence right breast is benign. LUNGS:  Clear to auscultation bilaterally.  No wheezes or rhonchi. HEART:  Regular rate and rhythm. No murmur appreciated. ABDOMEN:  Soft, nontender.  Positive, normoactive bowel sounds. No organomegaly palpated. MSK:  No focal spinal tenderness to palpation. Full range of motion bilaterally in the upper extremities. EXTREMITIES:  No peripheral edema.   SKIN:  Clear with no obvious rashes or skin changes. No nail dyscrasia. NEURO:  Nonfocal. Well oriented.  Appropriate affect.  LABORATORY DATA:  None for this visit.  DIAGNOSTIC IMAGING:  None for this visit.      ASSESSMENT AND PLAN:  Ms.. Karen Vazquez is a pleasant 67 y.o. female with Stage 1A left breast invasive ductal carcinoma, ER+/PR-/HER2+, diagnosed in June 2023, treated with lumpectomy, adjuvant chemotherapy, maintenance trastuzumab adjuvant radiation therapy, and anti-estrogen therapy with anastrozole beginning in February 2024.  She presents to the Survivorship Clinic for our initial meeting and routine follow-up post-completion of treatment for breast cancer.     1. Stage 1A left breast cancer:  Ms. Baglio is continuing to recover from definitive treatment for breast cancer.  She will continue on maintenance trastuzumab every 3 weeks and is scheduled to finish this in August 2024.Marland Kitchen  She will continue her anti-estrogen therapy with anastrozole. Thus far, she is tolerating the anastrozole well, with minimal side effects. She was instructed to make Dr. Lindi Adie or myself aware if she begins to experience any worsening side effects of the medication and I could see her back in clinic to help manage those side effects, as needed. Her mammogram is due 09/2022; orders placed today. . Today, a comprehensive survivorship care plan and treatment summary was reviewed with the patient today detailing her breast cancer diagnosis, treatment course, potential late/long-term effects of treatment, appropriate follow-up care with recommendations for the future, and patient education resources.  A copy of this summary, along with a letter will be sent to the patient's primary care provider via mail/fax/In Basket message after today's visit.    2.  Loose stools: I gave her a handout on a low FODMAP diet to see if that may help improve the consistency of her stools.  Will also take Imodium as needed and if she needs a refill of Lomotil in the future she will let us know.  3. Bone health:  Given Ms. Saar's age/history of breast cancer and her current treatment regimen including anti-estrogen therapy with anastrozole, she is at risk for bone demineralization.  She underwent bone density testing in May 2022 which demonstrated osteopenia with a T-score -1.3.  I recommended that she undergo repeat testing in May 2024 since she is taking the anastrozole.  She is going to ask her primary care about  this since the last location she underwent bone density testing was at that location.  She was given education on specific activities to promote bone health.  4. Cancer screening:  Due to Ms.  Ressler's history and her age, she should receive screening for skin cancers, colon cancer, and gynecologic cancers.  The information and recommendations are listed on the patient's comprehensive care plan/treatment summary and were reviewed in detail with the patient.    5. Health maintenance and wellness promotion: Ms. Boehler was encouraged to consume 5-7 servings of fruits and vegetables per day. We reviewed the "Nutrition Rainbow" handout.  She was also encouraged to engage in moderate to vigorous exercise for 30 minutes per day most days of the week. She was instructed to limit her alcohol consumption and continue to abstain from tobacco use.     6. Support services/counseling: It is not uncommon for this period of the patient's cancer care trajectory to be one of many emotions and stressors. She was given information regarding our available services and encouraged to contact me with any questions or for help enrolling in any of our support group/programs.    Follow up instructions:    -Return to cancer center every three weeks as scheduled to complete maintenance trastuzumab  -Mammogram due in 09/2022 -Bone density testing due in 07/2022 -She is welcome to return back to the Survivorship Clinic at any time; no additional follow-up needed at this time.  -Consider referral back to survivorship as a long-term survivor for continued surveillance  The patient was provided an opportunity to ask questions and all were answered. The patient agreed with the plan and demonstrated an understanding of the instructions.   Total encounter time:45 minutes*in face-to-face visit time, chart review, lab review, care coordination, order entry, and documentation of the encounter time.    Wilber Bihari, NP 05/12/22 10:56 AM Medical Oncology and Hematology [facility], [place] [address] Tel. [phone]    Fax. [phone]  *Total Encounter Time as defined by the  Centers for Medicare and Medicaid Services includes, in addition to the face-to-face time of a patient visit (documented in the note above) non-face-to-face time: obtaining and reviewing outside history, ordering and reviewing medications, tests or procedures, care coordination (communications with other health care professionals or caregivers) and documentation in the medical record.

## 2022-05-16 ENCOUNTER — Ambulatory Visit: Payer: Medicare Other | Attending: Surgery

## 2022-05-16 VITALS — Wt 232.5 lb

## 2022-05-16 DIAGNOSIS — Z483 Aftercare following surgery for neoplasm: Secondary | ICD-10-CM | POA: Insufficient documentation

## 2022-05-16 NOTE — Therapy (Signed)
OUTPATIENT PHYSICAL THERAPY SOZO SCREENING NOTE   Patient Name: Karen Vazquez MRN: [id number] DOB:[birth year], 68 y.o., female Today's Date: 05/16/2022  PCP: Kathyrn Drown, MD REFERRING PROVIDER: Erroll Luna, MD   PT End of Session - 05/16/22 1035     Visit Number 2   # unchanged due to screen only   PT Start Time 1032    PT Stop Time 1040    PT Time Calculation (min) 8 min    Activity Tolerance Patient tolerated treatment well    Behavior During Therapy [facility] for tasks assessed/performed             Past Medical History:  Diagnosis Date   Anemia    Anxiety    Arthritis    Back pain    Blood transfusion 2011   at [facility]   Bulging lumbar disc    Chest pain    Diabetes mellitus without complication (HCC)    Dry mouth    Easy bruising    Excessive thirst    Fatigue    Frequent urination    GERD (gastroesophageal reflux disease)    Headache    Heat intolerance    Hypertension    Hypothyroidism    Joint pain    Leg pain    Muscle stiffness    Nervousness    Palpitations    Pre-diabetes    Restless leg syndrome    Shortness of breath on exertion    Sleep apnea    does not use c-pap machine   Stomach ulcer    Stress    Swelling of both lower extremities    Trouble in sleeping    Vitamin D deficiency    Weakness    Past Surgical History:  Procedure Laterality Date   APPENDECTOMY     BIOPSY  05/16/2019   Procedure: BIOPSY;  Surgeon: Rogene Houston, MD;  [facility];  Service: Endoscopy;;  duodenum antral   BLADDER SUSPENSION  12/29/2010   Procedure: TRANSVAGINAL TAPE (TVT) PROCEDURE;  Surgeon: Daria Pastures;  Location: [facility] [facility];  Service: Gynecology;  Laterality: N/A;   BREAST LUMPECTOMY WITH RADIOACTIVE SEED AND SENTINEL LYMPH NODE BIOPSY Left 10/26/2021   Procedure: LEFT BREAST SEED LUMPECTOMY WITH LEFT SENTINEL LYMPH NODE MAPPING;  Surgeon: Erroll Luna, MD;  Location: [place];  Service: General;  Laterality: Left;    BREAST SURGERY     left breast lumpectomy 1977   CATARACT EXTRACTION     COLONOSCOPY N/A 03/03/2016   Procedure: COLONOSCOPY;  Surgeon: Rogene Houston, MD;  [facility];  Service: Endoscopy;  Laterality: N/A;  2:00 - moved to 12/14 @ 12:55 - Ann notified pt   CYSTOCELE REPAIR  12/29/2010   Procedure: ANTERIOR REPAIR (CYSTOCELE);  Surgeon: Daria Pastures;  Location: [facility] [facility];  Service: Gynecology;  Laterality: N/A;   CYSTOSCOPY  12/29/2010   Procedure: CYSTOSCOPY;  Surgeon: Daria Pastures;  Location: [facility];  Service: Gynecology;  Laterality: N/A;   ESOPHAGOGASTRODUODENOSCOPY N/A 05/16/2019   Procedure: ESOPHAGOGASTRODUODENOSCOPY (EGD);  Surgeon: Rogene Houston, MD;  [facility];  Service: Endoscopy;  Laterality: N/A;   JOINT REPLACEMENT  2011   rt knee   PORTACATH PLACEMENT N/A 10/26/2021   Procedure: PORT PLACEMENT WITH ULTRASOUND GUIDANCE;  Surgeon: Erroll Luna, MD;  Location: [place];  Service: General;  Laterality: N/A;   REPLACEMENT TOTAL KNEE Right 2011   TONSILLECTOMY     TOTAL KNEE ARTHROPLASTY  Left 11/17/2014   Procedure: LEFT TOTAL KNEE ARTHROPLASTY;  Surgeon: Gaynelle Arabian, MD;  Location: [facility];  Service: Orthopedics;  Laterality: Left;   TUBAL LIGATION     VAGINAL HYSTERECTOMY  12/29/2010   Procedure: HYSTERECTOMY VAGINAL;  Surgeon: Daria Pastures;  Location: [facility];  Service: Gynecology;  Laterality: N/A;   Patient Active Problem List   Diagnosis Date Noted   Port-A-Cath in place 11/25/2021   Genetic testing 09/20/2021   Family history of prostate cancer in father 09/15/2021   Family history of breast cancer 09/15/2021   Malignant neoplasm of upper-outer quadrant of left breast in female, estrogen receptor positive ([place]) 09/09/2021   Menopausal symptom 08/10/2021   UTI (urinary tract infection) 08/10/2021   Eating disorder 04/01/2021   Lesion of vulva 12/22/2020   History of iron deficiency anemia 10/27/2020    Cataract, nuclear sclerotic, both eyes 01/10/2020   Duodenal ulcer 06/24/2019   Fracture of distal end of radius 01/22/2018   History of total knee replacement, right 06/08/2017   History of colonic polyps 04/26/2016   OA (osteoarthritis) of knee 11/17/2014   Essential hypertension 05/28/2014   Obstructive sleep apnea 05/28/2014   Anemia, iron deficiency 12/16/2013   Prediabetes 05/01/2013   GERD (gastroesophageal reflux disease) 02/06/2013   Hypothyroidism 12/12/2012   Insomnia 12/12/2012   Hyperlipidemia 12/12/2012   Osteoarthritis 12/12/2012   Restless legs syndrome 12/12/2012   S/P LASIK surgery of both eyes 03/21/1998    REFERRING DIAG: left breast cancer at risk for lymphedema  THERAPY DIAG:  Aftercare following surgery for neoplasm  PERTINENT HISTORY: Patient was diagnosed on 08/17/2021 with left grade 2 invasive ductal carcinoma breast cancer. It is ER positive, PR negative, and HER2 positive with a Ki67 of 55%. She underwent a left lumpectomy and sentinel node biopsy (3 negative nodes) on 10/26/2021. She had a benign left breast lump removed in 1977, has anemia, hypertension, and has had both knee replaced in 2011 and 2016 (unknown when right versus left were done).   PRECAUTIONS: left UE Lymphedema risk,   SUBJECTIVE: Pt is here for 3 month SOZO screening.  PAIN:  Are you having pain? No  SOZO SCREENING: Patient was assessed today using the SOZO machine to determine the lymphedema index score. This was compared to her baseline score. It was determined that she is within the recommended range when compared to her baseline and no further action is needed at this time. She will continue SOZO screenings. These are done every 3 months for 2 years post operatively followed by every 6 months for 2 years, and then annually.   L-DEX FLOWSHEETS - 05/16/22 1000       L-DEX LYMPHEDEMA SCREENING   Measurement Type Unilateral    L-DEX MEASUREMENT EXTREMITY Upper Extremity     POSITION  Standing    DOMINANT SIDE Right    At Risk Side Left    BASELINE SCORE (UNILATERAL) 2.2    L-DEX SCORE (UNILATERAL) 4.5    VALUE CHANGE (UNILAT) 2.3                Otelia Limes, PTA 05/16/2022, 10:40 AM

## 2022-05-20 ENCOUNTER — Other Ambulatory Visit: Payer: Self-pay

## 2022-05-22 ENCOUNTER — Encounter: Payer: Self-pay | Admitting: Adult Health

## 2022-05-23 ENCOUNTER — Telehealth: Payer: Self-pay | Admitting: Hematology and Oncology

## 2022-05-23 NOTE — Telephone Encounter (Signed)
Rescheduled appointment per 3/4 secure chat. Patient is aware of the changes made to her upcoming appointment.

## 2022-05-25 NOTE — Progress Notes (Signed)
The following biosimilar Ontruzant (trastuzumab-dttb) has been selected for use in this patient.  Kennith Center, Pharm.D., CPP 05/25/2022'@4'$ :05 PM

## 2022-05-31 ENCOUNTER — Other Ambulatory Visit: Payer: Self-pay | Admitting: Family Medicine

## 2022-06-01 ENCOUNTER — Inpatient Hospital Stay: Payer: Medicare Other

## 2022-06-01 ENCOUNTER — Inpatient Hospital Stay: Payer: Medicare Other | Attending: Hematology and Oncology

## 2022-06-01 VITALS — BP 123/63 | HR 80 | Temp 98.1°F | Resp 18

## 2022-06-01 DIAGNOSIS — Z5111 Encounter for antineoplastic chemotherapy: Secondary | ICD-10-CM | POA: Insufficient documentation

## 2022-06-01 DIAGNOSIS — C50412 Malignant neoplasm of upper-outer quadrant of left female breast: Secondary | ICD-10-CM

## 2022-06-01 MED ORDER — SODIUM CHLORIDE 0.9 % IV SOLN: Freq: Once | INTRAVENOUS | Status: AC

## 2022-06-01 MED ORDER — TRASTUZUMAB-DTTB CHEMO 150 MG IV SOLR
6.0000 mg/kg | Freq: Once | INTRAVENOUS | Status: AC
  Administered 2022-06-01: 600 mg via INTRAVENOUS
  Filled 2022-06-01: qty 28.57

## 2022-06-01 MED ORDER — HEPARIN SOD (PORK) LOCK FLUSH 100 UNIT/ML IV SOLN
500.0000 [IU] | Freq: Once | INTRAVENOUS | Status: AC | PRN
  Administered 2022-06-01: 500 [IU]

## 2022-06-01 MED ORDER — DIPHENHYDRAMINE HCL 25 MG PO CAPS
50.0000 mg | ORAL_CAPSULE | Freq: Once | ORAL | Status: AC
  Administered 2022-06-01: 50 mg via ORAL
  Filled 2022-06-01: qty 2

## 2022-06-01 MED ORDER — SODIUM CHLORIDE 0.9% FLUSH
10.0000 mL | INTRAVENOUS | Status: DC | PRN
  Administered 2022-06-01: 10 mL

## 2022-06-01 MED ORDER — ACETAMINOPHEN 325 MG PO TABS
650.0000 mg | ORAL_TABLET | Freq: Once | ORAL | Status: AC
  Administered 2022-06-01: 650 mg via ORAL
  Filled 2022-06-01: qty 2

## 2022-06-01 NOTE — Patient Instructions (Signed)
[facility] [place] [facility]  Discharge Instructions: Thank you for choosing [facility] to provide your oncology and hematology care.   If you have a lab appointment with the Cancer Center, please go directly to the Cancer Center and check in at the registration area.   Wear comfortable clothing and clothing appropriate for easy access to any Portacath or PICC line.   We strive to give you quality time with your provider. You may need to reschedule your appointment if you arrive late (15 or more minutes).  Arriving late affects you and other patients whose appointments are after yours.  Also, if you miss three or more appointments without notifying the office, you may be dismissed from the clinic at the provider's discretion.      For prescription refill requests, have your pharmacy contact our office and allow 72 hours for refills to be completed.    Today you received the following chemotherapy and/or immunotherapy agents: Trastuzumab      To help prevent nausea and vomiting after your treatment, we encourage you to take your nausea medication as directed.  BELOW ARE SYMPTOMS THAT SHOULD BE REPORTED IMMEDIATELY: *FEVER GREATER THAN 100.4 F (38 C) OR HIGHER *CHILLS OR SWEATING *NAUSEA AND VOMITING THAT IS NOT CONTROLLED WITH YOUR NAUSEA MEDICATION *UNUSUAL SHORTNESS OF BREATH *UNUSUAL BRUISING OR BLEEDING *URINARY PROBLEMS (pain or burning when urinating, or frequent urination) *BOWEL PROBLEMS (unusual diarrhea, constipation, pain near the anus) TENDERNESS IN MOUTH AND THROAT WITH OR WITHOUT PRESENCE OF ULCERS (sore throat, sores in mouth, or a toothache) UNUSUAL RASH, SWELLING OR PAIN  UNUSUAL VAGINAL DISCHARGE OR ITCHING   Items with * indicate a potential emergency and should be followed up as soon as possible or go to the Emergency Department if any problems should occur.  Please show the CHEMOTHERAPY ALERT CARD or IMMUNOTHERAPY ALERT CARD at  check-in to the Emergency Department and triage nurse.  Should you have questions after your visit or need to cancel or reschedule your appointment, please contact [place]  Dept: [phone]  and follow the prompts.  Office hours are 8:00 a.m. to 4:30 p.m. Monday - Friday. Please note that voicemails left after 4:00 p.m. may not be returned until the following business day.  We are closed weekends and major holidays. You have access to a nurse at all times for urgent questions. Please call the main number to the clinic Dept: [phone] and follow the prompts.   For any non-urgent questions, you may also contact your provider using MyChart. We now offer e-Visits for anyone 18 and older to request care online for non-urgent symptoms. For details visit [email or web address].   Also download the MyChart app! Go to the app store, search "MyChart", open the app, select [place], and log in with your MyChart username and password.  

## 2022-06-02 NOTE — Telephone Encounter (Signed)
Please verify with patient-if she is desiring to have this refilled may have 4 refills if she says that this is just the pharmacy sending it and she does not want them please notify the pharmacy to stop sending it

## 2022-06-15 NOTE — Progress Notes (Signed)
Patient Care Team: Kathyrn Drown, MD as PCP - General (Family Medicine) Erroll Luna, MD as Consulting Physician (General Surgery) Nicholas Lose, MD as Consulting Physician (Hematology and Oncology) Eppie Gibson, MD as Attending Physician (Radiation Oncology)  DIAGNOSIS: No diagnosis found.  SUMMARY OF ONCOLOGIC HISTORY: Oncology History  Malignant neoplasm of upper-outer quadrant of left breast in female, estrogen receptor positive ([place])  09/07/2021 Initial Diagnosis   Screening mammogram detected left breast mass by ultrasound 2 o'clock position measured 1.4 cm axilla was negative.  Ultrasound-guided biopsy revealed grade 2 IDC with focal necrosis ER 90% weak, PR 0%, Ki-67 55%, HER2 3+ positive   09/15/2021 Cancer Staging   Staging form: Breast, AJCC 8th Edition - Clinical: Stage IA (cT1b, cN0, cM0, G3, ER-, PR-, HER2+) - Signed by Eppie Gibson, MD on 03/22/2022 Stage prefix: Initial diagnosis Histologic grading system: 3 grade system    Genetic Testing   [facility] CustomNext Panel was Negative. Report date is 10/04/2021.  The CustomNext-Cancer+RNAinsight panel offered by [facility] includes sequencing and rearrangement analysis for the following 47 genes:  APC, ATM, AXIN2, BARD1, BMPR1A, BRCA1, BRCA2, BRIP1, CDH1, CDK4, CDKN2A, CHEK2, CTNNA1, DICER1, EPCAM, GREM1, HOXB13, KIT, MEN1, MLH1, MSH2, MSH3, MSH6, MUTYH, NBN, NF1, NTHL1, PALB2, PDGFRA, PMS2, POLD1, POLE, PTEN, RAD50, RAD51C, RAD51D, SDHA, SDHB, SDHC, SDHD, SMAD4, SMARCA4, STK11, TP53, TSC1, TSC2, and VHL.  RNA data is routinely analyzed for use in variant interpretation for all genes.   10/26/2021 Surgery   Left lumpectomy: Grade 3 IDC 1.9 cm with high-grade DCIS, margins negative, 0/3 lymph nodes negative, ER 90%, PR 0%, HER2 3+ positive, Ki-67 55%    10/26/2021 Cancer Staging   Staging form: Breast, AJCC 8th Edition - Pathologic stage from 10/26/2021: Stage IA (pT1c, pN0, cM0, G3, ER+, PR-, HER2+) - Signed by Gardenia Phlegm, NP on 05/12/2022 Stage prefix: Initial diagnosis Histologic grading system: 3 grade system   11/25/2021 -  Chemotherapy   Patient is on Treatment Plan : BREAST Paclitaxel + Trastuzumab q7d / Trastuzumab q21d     03/16/2022 - 04/13/2022 Radiation Therapy   40.05 Gy in 15 treatments "Boost": 10 Gy in 5 treatments   04/2022 -  Anti-estrogen oral therapy   Anastrozole x 7 years     CHIEF COMPLIANT: Follow up herceptin   INTERVAL HISTORY: Karen Vazquez is a 67 y.o with the above mentioned left lumpectomy followed by adjuvant Taxol and herceptin. She presents to the clinic for a follow-up and treatment. 5   ALLERGIES:  is allergic to lisinopril.  MEDICATIONS:  Current Outpatient Medications  Medication Sig Dispense Refill   ALPRAZolam (XANAX) 0.5 MG tablet TAKE ONE TABLET BY MOUTH TWO TIMES DAILYAS NEEDED FOR ANXIETY OR SLEEP. 30 tablet 5   amLODipine (NORVASC) 2.5 MG tablet Take 1 tablet (2.5 mg total) by mouth daily. 30 tablet 5   anastrozole (ARIMIDEX) 1 MG tablet Take 1 tablet (1 mg total) by mouth daily. 90 tablet 3   ARMOUR THYROID 15 MG tablet TAKE ONE TABLET ONCE DAILY 30 tablet 5   diphenoxylate-atropine (LOMOTIL) 2.5-0.025 MG tablet Take 1 tablet by mouth 4 (four) times daily as needed for diarrhea or loose stools. 30 tablet 1   fluticasone (FLONASE) 50 MCG/ACT nasal spray USE 2 SPRAYS IN EACH NOSTRIL DAILY 16 g 1   lidocaine-prilocaine (EMLA) cream Apply to affected area once 30 g 3   oxybutynin (DITROPAN-XL) 5 MG 24 hr tablet Take by mouth.     pantoprazole (PROTONIX) 40 MG  tablet Take 1 tablet (40 mg total) by mouth daily. 30 tablet 5   pregabalin (LYRICA) 50 MG capsule 1 each evening 30 capsule 5   thyroid (ARMOUR THYROID) 30 MG tablet TAKE ONE (1) TABLET BY MOUTH EVERY DAY 30 tablet 5   No current facility-administered medications for this visit.    PHYSICAL EXAMINATION: ECOG PERFORMANCE STATUS: {CHL ONC ECOG [phone]}  There were no vitals  filed for this visit. There were no vitals filed for this visit.  BREAST:*** No palpable masses or nodules in either right or left breasts. No palpable axillary supraclavicular or infraclavicular adenopathy no breast tenderness or nipple discharge. (exam performed in the presence of a chaperone)  LABORATORY DATA:  I have reviewed the data as listed    Latest Ref Rng & Units 05/12/2022   10:10 AM 03/30/2022   11:35 AM 02/16/2022   10:21 AM  CMP  Glucose 70 - 99 mg/dL 122  88  96   BUN 8 - 23 mg/dL 15  10  11    Creatinine 0.44 - 1.00 mg/dL 0.78  0.79  0.88   Sodium 135 - 145 mmol/L 140  141  139   Potassium 3.5 - 5.1 mmol/L 3.9  3.9  4.0   Chloride 98 - 111 mmol/L 106  107  106   CO2 22 - 32 mmol/L 29  30  29    Calcium 8.9 - 10.3 mg/dL 9.0  9.4  9.7   Total Protein 6.5 - 8.1 g/dL 6.5  6.5  6.6   Total Bilirubin 0.3 - 1.2 mg/dL 0.4  0.4  0.5   Alkaline Phos 38 - 126 U/L 111  97  86   AST 15 - 41 U/L 14  15  16    ALT 0 - 44 U/L 9  10  16      Lab Results  Component Value Date   WBC 4.0 05/12/2022   HGB 10.6 (L) 05/12/2022   HCT 34.2 (L) 05/12/2022   MCV 74.7 (L) 05/12/2022   PLT 159 05/12/2022   NEUTROABS 2.3 05/12/2022    ASSESSMENT & PLAN:  No problem-specific Assessment & Plan notes found for this encounter.    No orders of the defined types were placed in this encounter.  The patient has a good understanding of the overall plan. she agrees with it. she will call with any problems that may develop before the next visit here. Total time spent: 30 mins including face to face time and time spent for planning, charting and co-ordination of care   Suzzette Righter, Leon 06/15/22    I Gardiner Coins am acting as a Education administrator for [employer]  ***

## 2022-06-21 ENCOUNTER — Other Ambulatory Visit: Payer: Self-pay

## 2022-06-21 DIAGNOSIS — C50412 Malignant neoplasm of upper-outer quadrant of left female breast: Secondary | ICD-10-CM

## 2022-06-21 NOTE — Assessment & Plan Note (Signed)
09/07/2021:Screening mammogram detected left breast mass by ultrasound 2 o'clock position measured 1.4 cm axilla was negative.  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 adjuvant chemotherapy with Taxol Herceptin weekly x12 followed by Herceptin maintenance every 3 weeks for 1 year 2.  Adjuvant radiation therapy until 04/13/2022 3.  Adjuvant antiestrogen therapy ----------------------------------------------------------------------------------------------------------------------------- Current treatment: Herceptin maintenance therapy (until September 2024) Toxicities: None Once radiation is completed and we will start her on antiestrogen therapy.  (Approximately 04/30/2022)   Anastrozole toxicities:  Breast cancer surveillance: Breast exam 06/22/2022: Benign Mammogram scheduled for 09/26/2022

## 2022-06-22 ENCOUNTER — Other Ambulatory Visit: Payer: Self-pay | Admitting: Hematology and Oncology

## 2022-06-22 ENCOUNTER — Encounter: Payer: Self-pay | Admitting: *Deleted

## 2022-06-22 ENCOUNTER — Inpatient Hospital Stay: Payer: Medicare Other | Attending: Hematology and Oncology

## 2022-06-22 ENCOUNTER — Inpatient Hospital Stay (HOSPITAL_BASED_OUTPATIENT_CLINIC_OR_DEPARTMENT_OTHER): Payer: Medicare Other | Admitting: Hematology and Oncology

## 2022-06-22 ENCOUNTER — Inpatient Hospital Stay: Payer: Medicare Other

## 2022-06-22 VITALS — BP 145/62 | HR 70 | Temp 97.6°F | Resp 18 | Ht 64.0 in | Wt 233.5 lb

## 2022-06-22 VITALS — BP 140/77 | HR 69 | Resp 18

## 2022-06-22 DIAGNOSIS — C50412 Malignant neoplasm of upper-outer quadrant of left female breast: Secondary | ICD-10-CM | POA: Diagnosis not present

## 2022-06-22 DIAGNOSIS — Z79899 Other long term (current) drug therapy: Secondary | ICD-10-CM | POA: Insufficient documentation

## 2022-06-22 DIAGNOSIS — Z5111 Encounter for antineoplastic chemotherapy: Secondary | ICD-10-CM | POA: Insufficient documentation

## 2022-06-22 DIAGNOSIS — Z79811 Long term (current) use of aromatase inhibitors: Secondary | ICD-10-CM | POA: Diagnosis not present

## 2022-06-22 DIAGNOSIS — Z170 Estrogen receptor positive status [ER+]: Secondary | ICD-10-CM

## 2022-06-22 DIAGNOSIS — R5383 Other fatigue: Secondary | ICD-10-CM | POA: Insufficient documentation

## 2022-06-22 DIAGNOSIS — Z923 Personal history of irradiation: Secondary | ICD-10-CM | POA: Diagnosis not present

## 2022-06-22 DIAGNOSIS — D509 Iron deficiency anemia, unspecified: Secondary | ICD-10-CM | POA: Insufficient documentation

## 2022-06-22 DIAGNOSIS — Z9221 Personal history of antineoplastic chemotherapy: Secondary | ICD-10-CM | POA: Diagnosis not present

## 2022-06-22 DIAGNOSIS — Z95828 Presence of other vascular implants and grafts: Secondary | ICD-10-CM

## 2022-06-22 LAB — IRON AND IRON BINDING CAPACITY (CC-WL,HP ONLY)
Iron: 28 ug/dL (ref 28–170)
Saturation Ratios: 6 % — ABNORMAL LOW (ref 10.4–31.8)
TIBC: 505 ug/dL — ABNORMAL HIGH (ref 250–450)
UIBC: 477 ug/dL — ABNORMAL HIGH (ref 148–442)

## 2022-06-22 LAB — CMP (CANCER CENTER ONLY)
ALT: 11 U/L (ref 0–44)
AST: 15 U/L (ref 15–41)
Albumin: 3.9 g/dL (ref 3.5–5.0)
Alkaline Phosphatase: 108 U/L (ref 38–126)
Anion gap: 4 — ABNORMAL LOW (ref 5–15)
BUN: 14 mg/dL (ref 8–23)
CO2: 29 mmol/L (ref 22–32)
Calcium: 9.4 mg/dL (ref 8.9–10.3)
Chloride: 106 mmol/L (ref 98–111)
Creatinine: 0.87 mg/dL (ref 0.44–1.00)
GFR, Estimated: >60 mL/min (ref >60)
Glucose, Bld: 91 mg/dL (ref 70–99)
Potassium: 4 mmol/L (ref 3.5–5.1)
Sodium: 139 mmol/L (ref 135–145)
Total Bilirubin: 0.4 mg/dL (ref 0.3–1.2)
Total Protein: 6.5 g/dL (ref 6.5–8.1)

## 2022-06-22 LAB — CBC WITH DIFFERENTIAL (CANCER CENTER ONLY)
Abs Immature Granulocytes: 0.01 10*3/uL (ref 0.00–0.07)
Basophils Absolute: 0 10*3/uL (ref 0.0–0.1)
Basophils Relative: 0 %
Eosinophils Absolute: 0.2 10*3/uL (ref 0.0–0.5)
Eosinophils Relative: 4 %
HCT: 34.1 % — ABNORMAL LOW (ref 36.0–46.0)
Hemoglobin: 10.1 g/dL — ABNORMAL LOW (ref 12.0–15.0)
Immature Granulocytes: 0 %
Lymphocytes Relative: 26 %
Lymphs Abs: 1.3 10*3/uL (ref 0.7–4.0)
MCH: 21.5 pg — ABNORMAL LOW (ref 26.0–34.0)
MCHC: 29.6 g/dL — ABNORMAL LOW (ref 30.0–36.0)
MCV: 72.7 fL — ABNORMAL LOW (ref 80.0–100.0)
Monocytes Absolute: 0.4 10*3/uL (ref 0.1–1.0)
Monocytes Relative: 7 %
Neutro Abs: 3.1 10*3/uL (ref 1.7–7.7)
Neutrophils Relative %: 63 %
Platelet Count: 164 10*3/uL (ref 150–400)
RBC: 4.69 MIL/uL (ref 3.87–5.11)
RDW: 17.1 % — ABNORMAL HIGH (ref 11.5–15.5)
WBC Count: 5 10*3/uL (ref 4.0–10.5)
nRBC: 0 % (ref 0.0–0.2)

## 2022-06-22 LAB — FERRITIN: Ferritin: 8 ng/mL — ABNORMAL LOW (ref 11–307)

## 2022-06-22 MED ORDER — HEPARIN SOD (PORK) LOCK FLUSH 100 UNIT/ML IV SOLN
500.0000 [IU] | Freq: Once | INTRAVENOUS | Status: AC | PRN
  Administered 2022-06-22: 500 [IU]

## 2022-06-22 MED ORDER — ACETAMINOPHEN 325 MG PO TABS
650.0000 mg | ORAL_TABLET | Freq: Once | ORAL | Status: AC
  Administered 2022-06-22: 650 mg via ORAL
  Filled 2022-06-22: qty 2

## 2022-06-22 MED ORDER — DIPHENOXYLATE-ATROPINE 2.5-0.025 MG PO TABS
1.0000 | ORAL_TABLET | Freq: Four times a day (QID) | ORAL | 1 refills | Status: DC | PRN
Start: 2022-06-22 — End: 2022-08-03

## 2022-06-22 MED ORDER — TRASTUZUMAB-DTTB CHEMO 150 MG IV SOLR
6.0000 mg/kg | Freq: Once | INTRAVENOUS | Status: AC
  Administered 2022-06-22: 600 mg via INTRAVENOUS
  Filled 2022-06-22: qty 28.57

## 2022-06-22 MED ORDER — DIPHENHYDRAMINE HCL 25 MG PO CAPS
50.0000 mg | ORAL_CAPSULE | Freq: Once | ORAL | Status: AC
  Administered 2022-06-22: 50 mg via ORAL
  Filled 2022-06-22: qty 2

## 2022-06-22 MED ORDER — SODIUM CHLORIDE 0.9 % IV SOLN: Freq: Once | INTRAVENOUS | Status: AC

## 2022-06-22 MED ORDER — SODIUM CHLORIDE 0.9% FLUSH
10.0000 mL | Freq: Once | INTRAVENOUS | Status: AC
  Administered 2022-06-22: 10 mL

## 2022-06-22 MED ORDER — SODIUM CHLORIDE 0.9% FLUSH
10.0000 mL | INTRAVENOUS | Status: DC | PRN
  Administered 2022-06-22: 10 mL

## 2022-06-22 NOTE — Patient Instructions (Signed)
[facility]  Discharge Instructions: Thank you for choosing [facility] to provide your oncology and hematology care.   If you have a lab appointment with the Cancer Center, please go directly to the Cancer Center and check in at the registration area.   Wear comfortable clothing and clothing appropriate for easy access to any Portacath or PICC line.   We strive to give you quality time with your provider. You may need to reschedule your appointment if you arrive late (15 or more minutes).  Arriving late affects you and other patients whose appointments are after yours.  Also, if you miss three or more appointments without notifying the office, you may be dismissed from the clinic at the provider's discretion.      For prescription refill requests, have your pharmacy contact our office and allow 72 hours for refills to be completed.    Today you received the following chemotherapy and/or immunotherapy agents: Trastuzumab      To help prevent nausea and vomiting after your treatment, we encourage you to take your nausea medication as directed.  BELOW ARE SYMPTOMS THAT SHOULD BE REPORTED IMMEDIATELY: *FEVER GREATER THAN 100.4 F (38 C) OR HIGHER *CHILLS OR SWEATING *NAUSEA AND VOMITING THAT IS NOT CONTROLLED WITH YOUR NAUSEA MEDICATION *UNUSUAL SHORTNESS OF BREATH *UNUSUAL BRUISING OR BLEEDING *URINARY PROBLEMS (pain or burning when urinating, or frequent urination) *BOWEL PROBLEMS (unusual diarrhea, constipation, pain near the anus) TENDERNESS IN MOUTH AND THROAT WITH OR WITHOUT PRESENCE OF ULCERS (sore throat, sores in mouth, or a toothache) UNUSUAL RASH, SWELLING OR PAIN  UNUSUAL VAGINAL DISCHARGE OR ITCHING   Items with * indicate a potential emergency and should be followed up as soon as possible or go to the Emergency Department if any problems should occur.  Please show the CHEMOTHERAPY ALERT CARD or IMMUNOTHERAPY ALERT CARD at  check-in to the Emergency Department and triage nurse.  Should you have questions after your visit or need to cancel or reschedule your appointment, please contact [facility]  Dept: [phone]  and follow the prompts.  Office hours are 8:00 a.m. to 4:30 p.m. Monday - Friday. Please note that voicemails left after 4:00 p.m. may not be returned until the following business day.  We are closed weekends and major holidays. You have access to a nurse at all times for urgent questions. Please call the main number to the clinic Dept: [phone] and follow the prompts.   For any non-urgent questions, you may also contact your provider using MyChart. We now offer e-Visits for anyone 18 and older to request care online for non-urgent symptoms. For details visit [email or web address].   Also download the MyChart app! Go to the app store, search "MyChart", open the app, select [place] [facility], and log in with your MyChart username and password.  

## 2022-06-22 NOTE — Progress Notes (Signed)
Per Dr Lindi Adie, ok to proceed with treatment today with ECHO from Jan (58%).

## 2022-06-23 ENCOUNTER — Encounter: Payer: Self-pay | Admitting: Family Medicine

## 2022-06-27 ENCOUNTER — Telehealth: Payer: Self-pay | Admitting: Hematology and Oncology

## 2022-06-27 NOTE — Telephone Encounter (Signed)
Scheduled appointments per scheduling message. Patient is aware of the made appointments.

## 2022-07-01 ENCOUNTER — Inpatient Hospital Stay: Payer: Medicare Other

## 2022-07-01 VITALS — BP 120/61 | HR 80 | Temp 98.2°F | Resp 18

## 2022-07-01 DIAGNOSIS — Z95828 Presence of other vascular implants and grafts: Secondary | ICD-10-CM

## 2022-07-01 DIAGNOSIS — Z5111 Encounter for antineoplastic chemotherapy: Secondary | ICD-10-CM | POA: Diagnosis not present

## 2022-07-01 DIAGNOSIS — C50412 Malignant neoplasm of upper-outer quadrant of left female breast: Secondary | ICD-10-CM

## 2022-07-01 MED ORDER — SODIUM CHLORIDE 0.9 % IV SOLN
300.0000 mg | Freq: Once | INTRAVENOUS | Status: AC
  Administered 2022-07-01: 300 mg via INTRAVENOUS
  Filled 2022-07-01: qty 300

## 2022-07-01 MED ORDER — SODIUM CHLORIDE 0.9 % IV SOLN: Freq: Once | INTRAVENOUS | Status: AC

## 2022-07-01 MED ORDER — HEPARIN SOD (PORK) LOCK FLUSH 100 UNIT/ML IV SOLN
500.0000 [IU] | Freq: Once | INTRAVENOUS | Status: AC | PRN
  Administered 2022-07-01: 500 [IU]

## 2022-07-01 MED ORDER — SODIUM CHLORIDE 0.9% FLUSH
10.0000 mL | Freq: Once | INTRAVENOUS | Status: AC | PRN
  Administered 2022-07-01: 10 mL

## 2022-07-01 NOTE — Patient Instructions (Signed)

## 2022-07-08 ENCOUNTER — Inpatient Hospital Stay: Payer: Medicare Other

## 2022-07-08 ENCOUNTER — Other Ambulatory Visit: Payer: Self-pay

## 2022-07-08 VITALS — BP 135/76 | HR 77 | Temp 98.1°F | Resp 18

## 2022-07-08 DIAGNOSIS — C50412 Malignant neoplasm of upper-outer quadrant of left female breast: Secondary | ICD-10-CM

## 2022-07-08 DIAGNOSIS — Z5111 Encounter for antineoplastic chemotherapy: Secondary | ICD-10-CM | POA: Diagnosis not present

## 2022-07-08 DIAGNOSIS — Z95828 Presence of other vascular implants and grafts: Secondary | ICD-10-CM

## 2022-07-08 MED ORDER — SODIUM CHLORIDE 0.9 % IV SOLN
300.0000 mg | Freq: Once | INTRAVENOUS | Status: AC
  Administered 2022-07-08: 300 mg via INTRAVENOUS
  Filled 2022-07-08: qty 300

## 2022-07-08 MED ORDER — HEPARIN SOD (PORK) LOCK FLUSH 100 UNIT/ML IV SOLN
500.0000 [IU] | Freq: Once | INTRAVENOUS | Status: AC
  Administered 2022-07-08: 500 [IU]

## 2022-07-08 MED ORDER — SODIUM CHLORIDE 0.9 % IV SOLN: Freq: Once | INTRAVENOUS | Status: AC

## 2022-07-08 MED ORDER — SODIUM CHLORIDE 0.9% FLUSH
10.0000 mL | Freq: Once | INTRAVENOUS | Status: AC
  Administered 2022-07-08: 10 mL

## 2022-07-08 NOTE — Patient Instructions (Signed)

## 2022-07-08 NOTE — Progress Notes (Signed)
Pt tolerated tx well without complaint. Pt refused observation post venofer infusion. VSS at d/c. Pt ambulated to lobby.

## 2022-07-14 ENCOUNTER — Other Ambulatory Visit: Payer: Self-pay

## 2022-07-14 ENCOUNTER — Ambulatory Visit: Payer: Medicare Other | Admitting: Hematology and Oncology

## 2022-07-14 ENCOUNTER — Other Ambulatory Visit: Payer: Self-pay | Admitting: *Deleted

## 2022-07-14 ENCOUNTER — Inpatient Hospital Stay: Payer: Medicare Other

## 2022-07-14 ENCOUNTER — Other Ambulatory Visit: Payer: Medicare Other

## 2022-07-14 VITALS — BP 144/73 | HR 77 | Temp 98.0°F | Resp 18 | Wt 229.4 lb

## 2022-07-14 DIAGNOSIS — C50412 Malignant neoplasm of upper-outer quadrant of left female breast: Secondary | ICD-10-CM

## 2022-07-14 DIAGNOSIS — Z5181 Encounter for therapeutic drug level monitoring: Secondary | ICD-10-CM

## 2022-07-14 DIAGNOSIS — Z170 Estrogen receptor positive status [ER+]: Secondary | ICD-10-CM

## 2022-07-14 DIAGNOSIS — Z95828 Presence of other vascular implants and grafts: Secondary | ICD-10-CM

## 2022-07-14 DIAGNOSIS — Z5111 Encounter for antineoplastic chemotherapy: Secondary | ICD-10-CM | POA: Diagnosis not present

## 2022-07-14 LAB — CBC WITH DIFFERENTIAL (CANCER CENTER ONLY)
Abs Immature Granulocytes: 0.01 10*3/uL (ref 0.00–0.07)
Basophils Absolute: 0 10*3/uL (ref 0.0–0.1)
Basophils Relative: 0 %
Eosinophils Absolute: 0.2 10*3/uL (ref 0.0–0.5)
Eosinophils Relative: 3 %
HCT: 39.6 % (ref 36.0–46.0)
Hemoglobin: 11.9 g/dL — ABNORMAL LOW (ref 12.0–15.0)
Immature Granulocytes: 0 %
Lymphocytes Relative: 24 %
Lymphs Abs: 1.5 10*3/uL (ref 0.7–4.0)
MCH: 23 pg — ABNORMAL LOW (ref 26.0–34.0)
MCHC: 30.1 g/dL (ref 30.0–36.0)
MCV: 76.6 fL — ABNORMAL LOW (ref 80.0–100.0)
Monocytes Absolute: 0.4 10*3/uL (ref 0.1–1.0)
Monocytes Relative: 7 %
Neutro Abs: 4.1 10*3/uL (ref 1.7–7.7)
Neutrophils Relative %: 66 %
Platelet Count: 185 10*3/uL (ref 150–400)
RBC: 5.17 MIL/uL — ABNORMAL HIGH (ref 3.87–5.11)
RDW: 21.7 % — ABNORMAL HIGH (ref 11.5–15.5)
WBC Count: 6.1 10*3/uL (ref 4.0–10.5)
nRBC: 0 % (ref 0.0–0.2)

## 2022-07-14 LAB — CMP (CANCER CENTER ONLY)
ALT: 12 U/L (ref 0–44)
AST: 17 U/L (ref 15–41)
Albumin: 4.2 g/dL (ref 3.5–5.0)
Alkaline Phosphatase: 118 U/L (ref 38–126)
Anion gap: 5 (ref 5–15)
BUN: 15 mg/dL (ref 8–23)
CO2: 30 mmol/L (ref 22–32)
Calcium: 9.6 mg/dL (ref 8.9–10.3)
Chloride: 105 mmol/L (ref 98–111)
Creatinine: 1.16 mg/dL — ABNORMAL HIGH (ref 0.44–1.00)
GFR, Estimated: 52 mL/min — ABNORMAL LOW (ref >60)
Glucose, Bld: 111 mg/dL — ABNORMAL HIGH (ref 70–99)
Potassium: 4.2 mmol/L (ref 3.5–5.1)
Sodium: 140 mmol/L (ref 135–145)
Total Bilirubin: 0.5 mg/dL (ref 0.3–1.2)
Total Protein: 6.7 g/dL (ref 6.5–8.1)

## 2022-07-14 MED ORDER — SODIUM CHLORIDE 0.9% FLUSH
10.0000 mL | INTRAVENOUS | Status: DC | PRN
  Administered 2022-07-14: 10 mL

## 2022-07-14 MED ORDER — SODIUM CHLORIDE 0.9% FLUSH
10.0000 mL | Freq: Once | INTRAVENOUS | Status: AC
  Administered 2022-07-14: 10 mL

## 2022-07-14 MED ORDER — SODIUM CHLORIDE 0.9 % IV SOLN: Freq: Once | INTRAVENOUS | Status: AC

## 2022-07-14 MED ORDER — DIPHENHYDRAMINE HCL 25 MG PO CAPS
50.0000 mg | ORAL_CAPSULE | Freq: Once | ORAL | Status: AC
  Administered 2022-07-14: 50 mg via ORAL
  Filled 2022-07-14: qty 2

## 2022-07-14 MED ORDER — HEPARIN SOD (PORK) LOCK FLUSH 100 UNIT/ML IV SOLN
500.0000 [IU] | Freq: Once | INTRAVENOUS | Status: AC | PRN
  Administered 2022-07-14: 500 [IU]

## 2022-07-14 MED ORDER — ACETAMINOPHEN 325 MG PO TABS
650.0000 mg | ORAL_TABLET | Freq: Once | ORAL | Status: AC
  Administered 2022-07-14: 650 mg via ORAL
  Filled 2022-07-14: qty 2

## 2022-07-14 MED ORDER — TRASTUZUMAB-DTTB CHEMO 150 MG IV SOLR
6.0000 mg/kg | Freq: Once | INTRAVENOUS | Status: AC
  Administered 2022-07-14: 600 mg via INTRAVENOUS
  Filled 2022-07-14: qty 28.57

## 2022-07-14 NOTE — Progress Notes (Signed)
Per MD okay to treat with echo from 04/11/22.  Verbal orders received and placed to obtain repeat prior to next tx.

## 2022-07-14 NOTE — Patient Instructions (Signed)
[facility]  Discharge Instructions: Thank you for choosing [facility] to provide your oncology and hematology care.   If you have a lab appointment with the Cancer Center, please go directly to the Cancer Center and check in at the registration area.   Wear comfortable clothing and clothing appropriate for easy access to any Portacath or PICC line.   We strive to give you quality time with your provider. You may need to reschedule your appointment if you arrive late (15 or more minutes).  Arriving late affects you and other patients whose appointments are after yours.  Also, if you miss three or more appointments without notifying the office, you may be dismissed from the clinic at the provider's discretion.      For prescription refill requests, have your pharmacy contact our office and allow 72 hours for refills to be completed.    Today you received the following chemotherapy and/or immunotherapy agents: Ontruzant      To help prevent nausea and vomiting after your treatment, we encourage you to take your nausea medication as directed.  BELOW ARE SYMPTOMS THAT SHOULD BE REPORTED IMMEDIATELY: *FEVER GREATER THAN 100.4 F (38 C) OR HIGHER *CHILLS OR SWEATING *NAUSEA AND VOMITING THAT IS NOT CONTROLLED WITH YOUR NAUSEA MEDICATION *UNUSUAL SHORTNESS OF BREATH *UNUSUAL BRUISING OR BLEEDING *URINARY PROBLEMS (pain or burning when urinating, or frequent urination) *BOWEL PROBLEMS (unusual diarrhea, constipation, pain near the anus) TENDERNESS IN MOUTH AND THROAT WITH OR WITHOUT PRESENCE OF ULCERS (sore throat, sores in mouth, or a toothache) UNUSUAL RASH, SWELLING OR PAIN  UNUSUAL VAGINAL DISCHARGE OR ITCHING   Items with * indicate a potential emergency and should be followed up as soon as possible or go to the Emergency Department if any problems should occur.  Please show the CHEMOTHERAPY ALERT CARD or IMMUNOTHERAPY ALERT CARD at  check-in to the Emergency Department and triage nurse.  Should you have questions after your visit or need to cancel or reschedule your appointment, please contact [facility]  Dept: [phone]  and follow the prompts.  Office hours are 8:00 a.m. to 4:30 p.m. Monday - Friday. Please note that voicemails left after 4:00 p.m. may not be returned until the following business day.  We are closed weekends and major holidays. You have access to a nurse at all times for urgent questions. Please call the main number to the clinic Dept: [phone] and follow the prompts.   For any non-urgent questions, you may also contact your provider using MyChart. We now offer e-Visits for anyone 18 and older to request care online for non-urgent symptoms. For details visit [email or web address].   Also download the MyChart app! Go to the app store, search "MyChart", open the app, select [place], and log in with your MyChart username and password.   

## 2022-07-15 ENCOUNTER — Telehealth: Payer: Self-pay

## 2022-07-15 ENCOUNTER — Inpatient Hospital Stay: Payer: Medicare Other

## 2022-07-15 VITALS — BP 133/64 | HR 84 | Temp 97.9°F | Resp 17

## 2022-07-15 DIAGNOSIS — Z95828 Presence of other vascular implants and grafts: Secondary | ICD-10-CM

## 2022-07-15 DIAGNOSIS — Z5111 Encounter for antineoplastic chemotherapy: Secondary | ICD-10-CM | POA: Diagnosis not present

## 2022-07-15 DIAGNOSIS — Z170 Estrogen receptor positive status [ER+]: Secondary | ICD-10-CM

## 2022-07-15 MED ORDER — SODIUM CHLORIDE 0.9% FLUSH
10.0000 mL | Freq: Once | INTRAVENOUS | Status: AC | PRN
  Administered 2022-07-15: 10 mL

## 2022-07-15 MED ORDER — SODIUM CHLORIDE 0.9 % IV SOLN
300.0000 mg | Freq: Once | INTRAVENOUS | Status: AC
  Administered 2022-07-15: 300 mg via INTRAVENOUS
  Filled 2022-07-15: qty 300

## 2022-07-15 MED ORDER — HEPARIN SOD (PORK) LOCK FLUSH 100 UNIT/ML IV SOLN
500.0000 [IU] | Freq: Once | INTRAVENOUS | Status: AC | PRN
  Administered 2022-07-15: 500 [IU]

## 2022-07-15 MED ORDER — SODIUM CHLORIDE 0.9 % IV SOLN: Freq: Once | INTRAVENOUS | Status: AC

## 2022-07-15 NOTE — Telephone Encounter (Signed)
-----   Message from Loa Socks, NP sent at 07/14/2022  4:55 PM EDT ----- Patient kidney function is increased.  Has she been drinking enough water?  Recommend that if she has not that she get in at least 70 ounces per day. ----- Message ----- From: Interface, Lab In [place] Sent: 07/14/2022   2:54 PM EDT To: Serena Croissant, MD

## 2022-07-15 NOTE — Telephone Encounter (Signed)
Pt states she is likely not drinking enough fluids. Encouraged NP recommendation. She verbalized thanks and understanding.

## 2022-07-15 NOTE — Progress Notes (Signed)
Pt declined to stay for post 30 min obs, discharged with VSS, ambulatory to lobby 

## 2022-07-15 NOTE — Patient Instructions (Signed)

## 2022-07-18 ENCOUNTER — Other Ambulatory Visit: Payer: Self-pay | Admitting: Family Medicine

## 2022-07-18 ENCOUNTER — Other Ambulatory Visit: Payer: Self-pay | Admitting: *Deleted

## 2022-07-18 ENCOUNTER — Encounter: Payer: Self-pay | Admitting: Hematology and Oncology

## 2022-07-18 MED ORDER — CHOLESTYRAMINE 4 G PO PACK: 4.0000 g | PACK | Freq: Two times a day (BID) | ORAL | 12 refills | Status: DC

## 2022-07-18 NOTE — Progress Notes (Signed)
Received mychart message from pt requesting prescription for Questran for tx of diarrhea.  Pt states she is taking Lomotil very sparingly and would like to try an alternative.  Verbal orders received from MD for pt to start Questran 4 g BID as needed.  Prescription sent to pharmacy on file, pt educated and verbalized understanding.

## 2022-07-20 ENCOUNTER — Other Ambulatory Visit: Payer: Self-pay | Admitting: Family Medicine

## 2022-07-20 ENCOUNTER — Telehealth: Payer: Self-pay

## 2022-07-20 ENCOUNTER — Other Ambulatory Visit: Payer: Self-pay

## 2022-07-20 MED ORDER — PREGABALIN 50 MG PO CAPS: ORAL_CAPSULE | ORAL | 5 refills | Status: DC

## 2022-07-20 NOTE — Telephone Encounter (Signed)
Refill sent to [facility] pharmacy has requested pregabalin

## 2022-07-25 ENCOUNTER — Other Ambulatory Visit: Payer: Self-pay | Admitting: Family Medicine

## 2022-07-26 ENCOUNTER — Encounter: Payer: Self-pay | Admitting: Family Medicine

## 2022-07-27 ENCOUNTER — Other Ambulatory Visit: Payer: Self-pay | Admitting: Family Medicine

## 2022-07-27 MED ORDER — ALPRAZOLAM 0.5 MG PO TABS: ORAL_TABLET | ORAL | 5 refills | Status: DC

## 2022-08-02 NOTE — Progress Notes (Signed)
Patient Care Team: Babs Sciara, MD as PCP - General (Family Medicine) Harriette Bouillon, MD as Consulting Physician (General Surgery) Serena Croissant, MD as Consulting Physician (Hematology and Oncology) Lonie Peak, MD as Attending Physician (Radiation Oncology)  DIAGNOSIS: No diagnosis found.  SUMMARY OF ONCOLOGIC HISTORY: Oncology History  Malignant neoplasm of upper-outer quadrant of left breast in female, estrogen receptor positive (HCC)  09/07/2021 Initial Diagnosis   Screening mammogram detected left breast mass by ultrasound 2 o'clock position measured 1.4 cm axilla was negative.  Ultrasound-guided biopsy revealed grade 2 IDC with focal necrosis ER 90% weak, PR 0%, Ki-67 55%, HER2 3+ positive   09/15/2021 Cancer Staging   Staging form: Breast, AJCC 8th Edition - Clinical: Stage IA (cT1b, cN0, cM0, G3, ER-, PR-, HER2+) - Signed by Lonie Peak, MD on 03/22/2022 Stage prefix: Initial diagnosis Histologic grading system: 3 grade system    Genetic Testing   [facility] CustomNext Panel was Negative. Report date is 10/04/2021.  The CustomNext-Cancer+RNAinsight panel offered by [employer] includes sequencing and rearrangement analysis for the following 47 genes:  APC, ATM, AXIN2, BARD1, BMPR1A, BRCA1, BRCA2, BRIP1, CDH1, CDK4, CDKN2A, CHEK2, CTNNA1, DICER1, EPCAM, GREM1, HOXB13, KIT, MEN1, MLH1, MSH2, MSH3, MSH6, MUTYH, NBN, NF1, NTHL1, PALB2, PDGFRA, PMS2, POLD1, POLE, PTEN, RAD50, RAD51C, RAD51D, SDHA, SDHB, SDHC, SDHD, SMAD4, SMARCA4, STK11, TP53, TSC1, TSC2, and VHL.  RNA data is routinely analyzed for use in variant interpretation for all genes.   10/26/2021 Surgery   Left lumpectomy: Grade 3 IDC 1.9 cm with high-grade DCIS, margins negative, 0/3 lymph nodes negative, ER 90%, PR 0%, HER2 3+ positive, Ki-67 55%    10/26/2021 Cancer Staging   Staging form: Breast, AJCC 8th Edition - Pathologic stage from 10/26/2021: Stage IA (pT1c, pN0, cM0, G3, ER+, PR-, HER2+) - Signed by Loa Socks, NP on 05/12/2022 Stage prefix: Initial diagnosis Histologic grading system: 3 grade system   11/25/2021 -  Chemotherapy   Patient is on Treatment Plan : BREAST Paclitaxel + Trastuzumab q7d / Trastuzumab q21d     03/16/2022 - 04/13/2022 Radiation Therapy   40.05 Gy in 15 treatments "Boost": 10 Gy in 5 treatments   04/2022 -  Anti-estrogen oral therapy   Anastrozole x 7 years     CHIEF COMPLIANT: Follow up herceptin   INTERVAL HISTORY: Karen Vazquez is a 67 y.o with the above mentioned left lumpectomy followed by adjuvant Taxol and herceptin. She presents to the clinic for a follow-up.     ALLERGIES:  is allergic to lisinopril.  MEDICATIONS:  Current Outpatient Medications  Medication Sig Dispense Refill   ALPRAZolam (XANAX) 0.5 MG tablet TAKE ONE TABLET BY MOUTH TWO TIMES DAILYAS NEEDED FOR ANXIETY OR SLEEP. 30 tablet 5   amLODipine (NORVASC) 2.5 MG tablet Take 1 tablet (2.5 mg total) by mouth daily. 30 tablet 5   anastrozole (ARIMIDEX) 1 MG tablet Take 1 tablet (1 mg total) by mouth daily. 90 tablet 3   ARMOUR THYROID 15 MG tablet TAKE ONE TABLET ONCE DAILY 30 tablet 5   cholestyramine (QUESTRAN) 4 g packet Take 1 packet (4 g total) by mouth 2 (two) times daily. 60 each 12   diphenoxylate-atropine (LOMOTIL) 2.5-0.025 MG tablet Take 1 tablet by mouth 4 (four) times daily as needed for diarrhea or loose stools. 30 tablet 1   fluticasone (FLONASE) 50 MCG/ACT nasal spray USE 2 SPRAYS IN EACH NOSTRIL DAILY 16 g 1   lidocaine-prilocaine (EMLA) cream Apply to affected area once 30 g  3   oxybutynin (DITROPAN-XL) 5 MG 24 hr tablet Take by mouth.     pantoprazole (PROTONIX) 40 MG tablet Take 1 tablet (40 mg total) by mouth daily. 30 tablet 5   pregabalin (LYRICA) 50 MG capsule 1 each evening 30 capsule 5   thyroid (ARMOUR THYROID) 30 MG tablet TAKE ONE (1) TABLET BY MOUTH EVERY DAY 30 tablet 5   No current facility-administered medications for this visit.    PHYSICAL  EXAMINATION: ECOG PERFORMANCE STATUS: {CHL ONC ECOG [phone]}  There were no vitals filed for this visit. There were no vitals filed for this visit.  BREAST:*** No palpable masses or nodules in either right or left breasts. No palpable axillary supraclavicular or infraclavicular adenopathy no breast tenderness or nipple discharge. (exam performed in the presence of a chaperone)  LABORATORY DATA:  I have reviewed the data as listed    Latest Ref Rng & Units 07/14/2022    2:14 PM 06/22/2022   10:56 AM 05/12/2022   10:10 AM  CMP  Glucose 70 - 99 mg/dL 213  91  086   BUN 8 - 23 mg/dL 15  14  15    Creatinine 0.44 - 1.00 mg/dL 5.78  4.69  6.29   Sodium 135 - 145 mmol/L 140  139  140   Potassium 3.5 - 5.1 mmol/L 4.2  4.0  3.9   Chloride 98 - 111 mmol/L 105  106  106   CO2 22 - 32 mmol/L 30  29  29    Calcium 8.9 - 10.3 mg/dL 9.6  9.4  9.0   Total Protein 6.5 - 8.1 g/dL 6.7  6.5  6.5   Total Bilirubin 0.3 - 1.2 mg/dL 0.5  0.4  0.4   Alkaline Phos 38 - 126 U/L 118  108  111   AST 15 - 41 U/L 17  15  14    ALT 0 - 44 U/L 12  11  9      Lab Results  Component Value Date   WBC 6.1 07/14/2022   HGB 11.9 (L) 07/14/2022   HCT 39.6 07/14/2022   MCV 76.6 (L) 07/14/2022   PLT 185 07/14/2022   NEUTROABS 4.1 07/14/2022    ASSESSMENT & PLAN:  No problem-specific Assessment & Plan notes found for this encounter.    No orders of the defined types were placed in this encounter.  The patient has a good understanding of the overall plan. she agrees with it. she will call with any problems that may develop before the next visit here. Total time spent: 30 mins including face to face time and time spent for planning, charting and co-ordination of care   Sherlyn Lick, CMA 08/02/22    I Janan Ridge am acting as a Neurosurgeon for [employer]  ***

## 2022-08-02 NOTE — Assessment & Plan Note (Signed)
09/07/2021:Screening mammogram detected left breast mass by ultrasound 2 o'clock position measured 1.4 cm axilla was negative.  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 adjuvant chemotherapy with Taxol Herceptin weekly x12 followed by Herceptin maintenance every 3 weeks for 1 year 2.  Adjuvant radiation therapy until 04/13/2022 3.  Adjuvant antiestrogen therapy ----------------------------------------------------------------------------------------------------------------------------- Current treatment: Herceptin maintenance therapy (until September 2024) Toxicities: None Once radiation is completed and we will start her on antiestrogen therapy.  (Approximately 04/30/2022)   Anastrozole toxicities: Tolerating it well without any major problems   Microcytic anemia: We will check iron studies today.  If she is low in iron then we will plan for IV iron infusions.  This could be the reason why she feels so fatigued.   Breast cancer surveillance: Breast exam 08/03/2022: Benign Mammogram scheduled for 09/26/2022   Return to clinic every 3 weeks for Herceptin infusions.  (Until end of August)

## 2022-08-03 ENCOUNTER — Inpatient Hospital Stay: Payer: Medicare Other | Attending: Hematology and Oncology

## 2022-08-03 ENCOUNTER — Inpatient Hospital Stay (HOSPITAL_BASED_OUTPATIENT_CLINIC_OR_DEPARTMENT_OTHER): Payer: Medicare Other | Admitting: Hematology and Oncology

## 2022-08-03 ENCOUNTER — Inpatient Hospital Stay: Payer: Medicare Other

## 2022-08-03 VITALS — BP 164/71 | HR 68 | Temp 97.6°F | Resp 18 | Ht 64.0 in | Wt 230.4 lb

## 2022-08-03 DIAGNOSIS — Z170 Estrogen receptor positive status [ER+]: Secondary | ICD-10-CM

## 2022-08-03 DIAGNOSIS — Z923 Personal history of irradiation: Secondary | ICD-10-CM | POA: Diagnosis not present

## 2022-08-03 DIAGNOSIS — Z5111 Encounter for antineoplastic chemotherapy: Secondary | ICD-10-CM | POA: Insufficient documentation

## 2022-08-03 DIAGNOSIS — R197 Diarrhea, unspecified: Secondary | ICD-10-CM | POA: Diagnosis not present

## 2022-08-03 DIAGNOSIS — Z79890 Hormone replacement therapy (postmenopausal): Secondary | ICD-10-CM | POA: Insufficient documentation

## 2022-08-03 DIAGNOSIS — Z79899 Other long term (current) drug therapy: Secondary | ICD-10-CM | POA: Insufficient documentation

## 2022-08-03 DIAGNOSIS — C50412 Malignant neoplasm of upper-outer quadrant of left female breast: Secondary | ICD-10-CM | POA: Insufficient documentation

## 2022-08-03 DIAGNOSIS — Z79811 Long term (current) use of aromatase inhibitors: Secondary | ICD-10-CM | POA: Insufficient documentation

## 2022-08-03 DIAGNOSIS — Z95828 Presence of other vascular implants and grafts: Secondary | ICD-10-CM

## 2022-08-03 LAB — CMP (CANCER CENTER ONLY)
ALT: 13 U/L (ref 0–44)
AST: 17 U/L (ref 15–41)
Albumin: 4 g/dL (ref 3.5–5.0)
Alkaline Phosphatase: 98 U/L (ref 38–126)
Anion gap: 5 (ref 5–15)
BUN: 13 mg/dL (ref 8–23)
CO2: 29 mmol/L (ref 22–32)
Calcium: 9.1 mg/dL (ref 8.9–10.3)
Chloride: 107 mmol/L (ref 98–111)
Creatinine: 1.05 mg/dL — ABNORMAL HIGH (ref 0.44–1.00)
GFR, Estimated: 58 mL/min — ABNORMAL LOW (ref >60)
Glucose, Bld: 103 mg/dL — ABNORMAL HIGH (ref 70–99)
Potassium: 4.1 mmol/L (ref 3.5–5.1)
Sodium: 141 mmol/L (ref 135–145)
Total Bilirubin: 0.4 mg/dL (ref 0.3–1.2)
Total Protein: 6.4 g/dL — ABNORMAL LOW (ref 6.5–8.1)

## 2022-08-03 LAB — CBC WITH DIFFERENTIAL (CANCER CENTER ONLY)
Abs Immature Granulocytes: 0.03 10*3/uL (ref 0.00–0.07)
Basophils Absolute: 0 10*3/uL (ref 0.0–0.1)
Basophils Relative: 0 %
Eosinophils Absolute: 0.2 10*3/uL (ref 0.0–0.5)
Eosinophils Relative: 3 %
HCT: 38.9 % (ref 36.0–46.0)
Hemoglobin: 12.2 g/dL (ref 12.0–15.0)
Immature Granulocytes: 1 %
Lymphocytes Relative: 31 %
Lymphs Abs: 1.5 10*3/uL (ref 0.7–4.0)
MCH: 24.8 pg — ABNORMAL LOW (ref 26.0–34.0)
MCHC: 31.4 g/dL (ref 30.0–36.0)
MCV: 79.2 fL — ABNORMAL LOW (ref 80.0–100.0)
Monocytes Absolute: 0.3 10*3/uL (ref 0.1–1.0)
Monocytes Relative: 6 %
Neutro Abs: 2.8 10*3/uL (ref 1.7–7.7)
Neutrophils Relative %: 59 %
Platelet Count: 148 10*3/uL — ABNORMAL LOW (ref 150–400)
RBC: 4.91 MIL/uL (ref 3.87–5.11)
RDW: 21.9 % — ABNORMAL HIGH (ref 11.5–15.5)
WBC Count: 4.8 10*3/uL (ref 4.0–10.5)
nRBC: 0 % (ref 0.0–0.2)

## 2022-08-03 MED ORDER — HEPARIN SOD (PORK) LOCK FLUSH 100 UNIT/ML IV SOLN
500.0000 [IU] | Freq: Once | INTRAVENOUS | Status: AC | PRN
  Administered 2022-08-03: 500 [IU]

## 2022-08-03 MED ORDER — SODIUM CHLORIDE 0.9% FLUSH
10.0000 mL | Freq: Once | INTRAVENOUS | Status: AC
  Administered 2022-08-03: 10 mL

## 2022-08-03 MED ORDER — TRASTUZUMAB-DTTB CHEMO 150 MG IV SOLR
6.0000 mg/kg | Freq: Once | INTRAVENOUS | Status: AC
  Administered 2022-08-03: 600 mg via INTRAVENOUS
  Filled 2022-08-03: qty 28.57

## 2022-08-03 MED ORDER — DIPHENHYDRAMINE HCL 25 MG PO CAPS
50.0000 mg | ORAL_CAPSULE | Freq: Once | ORAL | Status: AC
  Administered 2022-08-03: 50 mg via ORAL
  Filled 2022-08-03: qty 2

## 2022-08-03 MED ORDER — ACETAMINOPHEN 325 MG PO TABS
650.0000 mg | ORAL_TABLET | Freq: Once | ORAL | Status: AC
  Administered 2022-08-03: 650 mg via ORAL
  Filled 2022-08-03: qty 2

## 2022-08-03 MED ORDER — SODIUM CHLORIDE 0.9% FLUSH
10.0000 mL | INTRAVENOUS | Status: DC | PRN
  Administered 2022-08-03: 10 mL

## 2022-08-03 MED ORDER — DIPHENOXYLATE-ATROPINE 2.5-0.025 MG PO TABS
1.0000 | ORAL_TABLET | Freq: Four times a day (QID) | ORAL | 1 refills | Status: DC | PRN
Start: 2022-08-03 — End: 2022-12-14

## 2022-08-03 MED ORDER — SODIUM CHLORIDE 0.9 % IV SOLN: Freq: Once | INTRAVENOUS | Status: AC

## 2022-08-03 NOTE — Progress Notes (Signed)
Ok to treat with last echo, Dr. Eather Colas and patient is scheduled to get one and aware.

## 2022-08-03 NOTE — Patient Instructions (Signed)
[facility]  Discharge Instructions: Thank you for choosing [facility] to provide your oncology and hematology care.   If you have a lab appointment with the Cancer Center, please go directly to the Cancer Center and check in at the registration area.   Wear comfortable clothing and clothing appropriate for easy access to any Portacath or PICC line.   We strive to give you quality time with your provider. You may need to reschedule your appointment if you arrive late (15 or more minutes).  Arriving late affects you and other patients whose appointments are after yours.  Also, if you miss three or more appointments without notifying the office, you may be dismissed from the clinic at the provider's discretion.      For prescription refill requests, have your pharmacy contact our office and allow 72 hours for refills to be completed.    Today you received the following chemotherapy and/or immunotherapy agents: Ontruzant      To help prevent nausea and vomiting after your treatment, we encourage you to take your nausea medication as directed.  BELOW ARE SYMPTOMS THAT SHOULD BE REPORTED IMMEDIATELY: *FEVER GREATER THAN 100.4 F (38 C) OR HIGHER *CHILLS OR SWEATING *NAUSEA AND VOMITING THAT IS NOT CONTROLLED WITH YOUR NAUSEA MEDICATION *UNUSUAL SHORTNESS OF BREATH *UNUSUAL BRUISING OR BLEEDING *URINARY PROBLEMS (pain or burning when urinating, or frequent urination) *BOWEL PROBLEMS (unusual diarrhea, constipation, pain near the anus) TENDERNESS IN MOUTH AND THROAT WITH OR WITHOUT PRESENCE OF ULCERS (sore throat, sores in mouth, or a toothache) UNUSUAL RASH, SWELLING OR PAIN  UNUSUAL VAGINAL DISCHARGE OR ITCHING   Items with * indicate a potential emergency and should be followed up as soon as possible or go to the Emergency Department if any problems should occur.  Please show the CHEMOTHERAPY ALERT CARD or IMMUNOTHERAPY ALERT CARD at  check-in to the Emergency Department and triage nurse.  Should you have questions after your visit or need to cancel or reschedule your appointment, please contact [facility]  Dept: [phone]  and follow the prompts.  Office hours are 8:00 a.m. to 4:30 p.m. Monday - Friday. Please note that voicemails left after 4:00 p.m. may not be returned until the following business day.  We are closed weekends and major holidays. You have access to a nurse at all times for urgent questions. Please call the main number to the clinic Dept: [phone] and follow the prompts.   For any non-urgent questions, you may also contact your provider using MyChart. We now offer e-Visits for anyone 18 and older to request care online for non-urgent symptoms. For details visit [email or web address].   Also download the MyChart app! Go to the app store, search "MyChart", open the app, select Java, and log in with your MyChart username and password.   

## 2022-08-05 ENCOUNTER — Ambulatory Visit (HOSPITAL_COMMUNITY)
Admission: RE | Admit: 2022-08-05 | Discharge: 2022-08-05 | Disposition: A | Discharge status: Home / Self Care | Payer: Medicare Other | Source: Ambulatory Visit | Attending: Hematology and Oncology | Admitting: Hematology and Oncology

## 2022-08-05 DIAGNOSIS — I351 Nonrheumatic aortic (valve) insufficiency: Secondary | ICD-10-CM | POA: Diagnosis not present

## 2022-08-05 DIAGNOSIS — E119 Type 2 diabetes mellitus without complications: Secondary | ICD-10-CM | POA: Diagnosis not present

## 2022-08-05 DIAGNOSIS — Z79899 Other long term (current) drug therapy: Secondary | ICD-10-CM | POA: Diagnosis not present

## 2022-08-05 DIAGNOSIS — G4730 Sleep apnea, unspecified: Secondary | ICD-10-CM | POA: Insufficient documentation

## 2022-08-05 DIAGNOSIS — Z5181 Encounter for therapeutic drug level monitoring: Secondary | ICD-10-CM

## 2022-08-05 DIAGNOSIS — I10 Essential (primary) hypertension: Secondary | ICD-10-CM | POA: Insufficient documentation

## 2022-08-05 LAB — ECHOCARDIOGRAM COMPLETE
AR max vel: 2.21 cm2
AV Area VTI: 2.39 cm2
AV Area mean vel: 2.3 cm2
AV Mean grad: 5.5 mmHg
AV Peak grad: 10.6 mmHg
Ao pk vel: 1.63 m/s
Area-P 1/2: 4.06 cm2
Calc EF: 60.5 %
P 1/2 time: 580 msec
S' Lateral: 3.3 cm
Single Plane A2C EF: 59.6 %
Single Plane A4C EF: 61.4 %

## 2022-08-08 ENCOUNTER — Encounter: Payer: Self-pay | Admitting: *Deleted

## 2022-08-08 ENCOUNTER — Ambulatory Visit: Payer: Medicare Other | Attending: Surgery

## 2022-08-08 VITALS — Wt 223.1 lb

## 2022-08-08 DIAGNOSIS — Z483 Aftercare following surgery for neoplasm: Secondary | ICD-10-CM | POA: Insufficient documentation

## 2022-08-08 NOTE — Therapy (Signed)
OUTPATIENT PHYSICAL THERAPY SOZO SCREENING NOTE   Patient Name: Karen Vazquez MRN: [id number] DOB:1955-11-06, 67 y.o., female Today's Date: 08/08/2022  PCP: Babs Sciara, MD REFERRING PROVIDER: Harriette Bouillon, MD   PT End of Session - 08/08/22 [phone]     Visit Number 2   # unchanged due to screen only   PT Start Time 0827    PT Stop Time 0831    PT Time Calculation (min) 4 min    Activity Tolerance Patient tolerated treatment well    Behavior During Therapy [facility] for tasks assessed/performed             Past Medical History:  Diagnosis Date   Anemia    Anxiety    Arthritis    Back pain    Blood transfusion 2011   at [facility]   Bulging lumbar disc    Chest pain    Diabetes mellitus without complication (HCC)    Dry mouth    Easy bruising    Excessive thirst    Fatigue    Frequent urination    GERD (gastroesophageal reflux disease)    Headache    Heat intolerance    Hypertension    Hypothyroidism    Joint pain    Leg pain    Muscle stiffness    Nervousness    Palpitations    Pre-diabetes    Restless leg syndrome    Shortness of breath on exertion    Sleep apnea    does not use c-pap machine   Stomach ulcer    Stress    Swelling of both lower extremities    Trouble in sleeping    Vitamin D deficiency    Weakness    Past Surgical History:  Procedure Laterality Date   APPENDECTOMY     BIOPSY  05/16/2019   Procedure: BIOPSY;  Surgeon: Malissa Hippo, MD;  [facility]: [facility];  Service: Endoscopy;;  duodenum antral   BLADDER SUSPENSION  12/29/2010   Procedure: TRANSVAGINAL TAPE (TVT) PROCEDURE;  Surgeon: Loney Laurence;  Location: [facility];  Service: Gynecology;  Laterality: N/A;   BREAST LUMPECTOMY WITH RADIOACTIVE SEED AND SENTINEL LYMPH NODE BIOPSY Left 10/26/2021   Procedure: LEFT BREAST SEED LUMPECTOMY WITH LEFT SENTINEL LYMPH NODE MAPPING;  Surgeon: Harriette Bouillon, MD;  Location: [facility];  Service: General;  Laterality: Left;    BREAST SURGERY     left breast lumpectomy 1977   CATARACT EXTRACTION     COLONOSCOPY N/A 03/03/2016   Procedure: COLONOSCOPY;  Surgeon: Malissa Hippo, MD;  Location: [facility];  Service: Endoscopy;  Laterality: N/A;  2:00 - moved to 12/14 @ 12:55 - Ann notified pt   CYSTOCELE REPAIR  12/29/2010   Procedure: ANTERIOR REPAIR (CYSTOCELE);  Surgeon: Loney Laurence;  Location: [facility];  Service: Gynecology;  Laterality: N/A;   CYSTOSCOPY  12/29/2010   Procedure: CYSTOSCOPY;  Surgeon: Loney Laurence;  Location: [facility];  Service: Gynecology;  Laterality: N/A;   ESOPHAGOGASTRODUODENOSCOPY N/A 05/16/2019   Procedure: ESOPHAGOGASTRODUODENOSCOPY (EGD);  Surgeon: Malissa Hippo, MD;  Location: [facility];  Service: Endoscopy;  Laterality: N/A;   JOINT REPLACEMENT  2011   rt knee   PORTACATH PLACEMENT N/A 10/26/2021   Procedure: PORT PLACEMENT WITH ULTRASOUND GUIDANCE;  Surgeon: Harriette Bouillon, MD;  Location: [facility];  Service: General;  Laterality: N/A;   REPLACEMENT TOTAL KNEE Right 2011   TONSILLECTOMY     TOTAL KNEE ARTHROPLASTY  Left 11/17/2014   Procedure: LEFT TOTAL KNEE ARTHROPLASTY;  Surgeon: Ollen Gross, MD;  Location: [facility];  Service: Orthopedics;  Laterality: Left;   TUBAL LIGATION     VAGINAL HYSTERECTOMY  12/29/2010   Procedure: HYSTERECTOMY VAGINAL;  Surgeon: Loney Laurence;  Location: [facility];  Service: Gynecology;  Laterality: N/A;   Patient Active Problem List   Diagnosis Date Noted   Port-A-Cath in place 11/25/2021   Genetic testing 09/20/2021   Family history of prostate cancer in father 09/15/2021   Family history of breast cancer 09/15/2021   Malignant neoplasm of upper-outer quadrant of left breast in female, estrogen receptor positive (HCC) 09/09/2021   Menopausal symptom 08/10/2021   UTI (urinary tract infection) 08/10/2021   Eating disorder 04/01/2021   Lesion of vulva 12/22/2020   History of iron deficiency anemia 10/27/2020    Cataract, nuclear sclerotic, both eyes 01/10/2020   Duodenal ulcer 06/24/2019   Fracture of distal end of radius 01/22/2018   History of total knee replacement, right 06/08/2017   History of colonic polyps 04/26/2016   OA (osteoarthritis) of knee 11/17/2014   Essential hypertension 05/28/2014   Obstructive sleep apnea 05/28/2014   Anemia, iron deficiency 12/16/2013   Prediabetes 05/01/2013   GERD (gastroesophageal reflux disease) 02/06/2013   Hypothyroidism 12/12/2012   Insomnia 12/12/2012   Hyperlipidemia 12/12/2012   Osteoarthritis 12/12/2012   Restless legs syndrome 12/12/2012   S/P LASIK surgery of both eyes 03/21/1998    REFERRING DIAG: left breast cancer at risk for lymphedema  THERAPY DIAG:  Aftercare following surgery for neoplasm  PERTINENT HISTORY: Patient was diagnosed on 08/17/2021 with left grade 2 invasive ductal carcinoma breast cancer. It is ER positive, PR negative, and HER2 positive with a Ki67 of 55%. She underwent a left lumpectomy and sentinel node biopsy (3 negative nodes) on 10/26/2021. She had a benign left breast lump removed in 1977, has anemia, hypertension, and has had both knee replaced in 2011 and 2016 (unknown when right versus left were done).   PRECAUTIONS: left UE Lymphedema risk,   SUBJECTIVE: Pt is here for 3 month SOZO screening.  PAIN:  Are you having pain? No  SOZO SCREENING: Patient was assessed today using the SOZO machine to determine the lymphedema index score. This was compared to her baseline score. It was determined that she is within the recommended range when compared to her baseline and no further action is needed at this time. She will continue SOZO screenings. These are done every 3 months for 2 years post operatively followed by every 6 months for 2 years, and then annually.   L-DEX FLOWSHEETS - 08/08/22 0800       L-DEX LYMPHEDEMA SCREENING   Measurement Type Unilateral    L-DEX MEASUREMENT EXTREMITY Upper Extremity     POSITION  Standing    DOMINANT SIDE Right    At Risk Side Left    BASELINE SCORE (UNILATERAL) 2.2    L-DEX SCORE (UNILATERAL) 1.6    VALUE CHANGE (UNILAT) -0.6                Hermenia Bers, PTA 08/08/2022, 8:30 AM

## 2022-08-09 ENCOUNTER — Other Ambulatory Visit: Payer: Self-pay

## 2022-08-19 ENCOUNTER — Other Ambulatory Visit: Payer: Self-pay

## 2022-08-19 ENCOUNTER — Ambulatory Visit (INDEPENDENT_AMBULATORY_CARE_PROVIDER_SITE_OTHER): Payer: Medicare Other

## 2022-08-19 VITALS — BP 164/71 | Ht 64.0 in | Wt 228.0 lb

## 2022-08-19 DIAGNOSIS — Z780 Asymptomatic menopausal state: Secondary | ICD-10-CM | POA: Diagnosis not present

## 2022-08-19 DIAGNOSIS — Z0000 Encounter for general adult medical examination without abnormal findings: Secondary | ICD-10-CM

## 2022-08-19 MED ORDER — PREGABALIN 50 MG PO CAPS: ORAL_CAPSULE | ORAL | 5 refills | Status: DC

## 2022-08-19 NOTE — Progress Notes (Signed)
I connected with  Karen Vazquez on 08/19/22 by a audio enabled telemedicine application and verified that I am speaking with the correct person using two identifiers.  Patient Location: Home  Provider Location: Home Office  I discussed the limitations of evaluation and management by telemedicine. The patient expressed understanding and agreed to proceed.  Patient Medicare AWV questionnaire was completed by the patient on 08/15/22; I have confirmed that all information answered by patient is correct and no changes since this date.    Subjective:   Karen Vazquez is a 67 y.o. female who presents for an Initial Medicare Annual Wellness Visit.  Review of Systems      Cardiac Risk Factors include: advanced age (>64men, >18 women);hypertension;sedentary lifestyle;obesity (BMI >30kg/m2);Other (see comment), Risk factor comments: Breast Cancer     Objective:    Today's Vitals   08/19/22 0805  BP: (!) 164/71  Weight: 228 lb (103.4 kg)  Height: 5\' 4"  (1.626 m)   Body mass index is 39.14 kg/m.     08/19/2022    8:26 AM 08/03/2022    2:02 PM 07/15/2022    2:48 PM 06/22/2022   11:50 AM 05/12/2022   10:33 AM 03/23/2022   11:40 AM 02/25/2022   12:24 PM  Advanced Directives  Does Patient Have a Medical Advance Directive? No No No No No No No  Would patient like information on creating a medical advance directive? No - Patient declined No - Patient declined No - Patient declined No - Patient declined No - Patient declined  No - Patient declined    Current Medications (verified) Outpatient Encounter Medications as of 08/19/2022  Medication Sig   ALPRAZolam (XANAX) 0.5 MG tablet TAKE ONE TABLET BY MOUTH TWO TIMES DAILYAS NEEDED FOR ANXIETY OR SLEEP.   amLODipine (NORVASC) 2.5 MG tablet Take 1 tablet (2.5 mg total) by mouth daily.   anastrozole (ARIMIDEX) 1 MG tablet Take 1 tablet (1 mg total) by mouth daily.   ARMOUR THYROID 15 MG tablet TAKE ONE TABLET ONCE DAILY   diphenoxylate-atropine  (LOMOTIL) 2.5-0.025 MG tablet Take 1 tablet by mouth 4 (four) times daily as needed for diarrhea or loose stools.   fluticasone (FLONASE) 50 MCG/ACT nasal spray USE 2 SPRAYS IN EACH NOSTRIL DAILY   lidocaine-prilocaine (EMLA) cream Apply to affected area once   oxybutynin (DITROPAN-XL) 5 MG 24 hr tablet Take by mouth.   pantoprazole (PROTONIX) 40 MG tablet Take 1 tablet (40 mg total) by mouth daily.   pregabalin (LYRICA) 50 MG capsule 1 each evening   thyroid (ARMOUR THYROID) 30 MG tablet TAKE ONE (1) TABLET BY MOUTH EVERY DAY   No facility-administered encounter medications on file as of 08/19/2022.    Allergies (verified) Lisinopril   History: Past Medical History:  Diagnosis Date   Allergy 15 years ago   Dry cough   Anemia    Anxiety    Arthritis    Back pain    Blood transfusion 2011   at [facility]   Bulging lumbar disc    Cancer [facility]) August 28, 2021   Breast cancer   Cataract 2022   Both taken care of in 2022   Chest pain    Diabetes mellitus without complication (HCC)    Dry mouth    Easy bruising    Excessive thirst    Fatigue    Frequent urination    GERD (gastroesophageal reflux disease)    Headache    Heart murmur 20 years ago  Doctor thinking since birth   Heat intolerance    Hypertension    Hypothyroidism    Joint pain    Leg pain    Muscle stiffness    Nervousness    Palpitations    Pre-diabetes    Restless leg syndrome    Shortness of breath on exertion    Sleep apnea    does not use c-pap machine   Stomach ulcer    Stress    Swelling of both lower extremities    Trouble in sleeping    Vitamin D deficiency    Weakness    Past Surgical History:  Procedure Laterality Date   ABDOMINAL HYSTERECTOMY  25 years ago   Partial hysterectomy. Still have ovaries   APPENDECTOMY     BIOPSY  05/16/2019   Procedure: BIOPSY;  Surgeon: Malissa Hippo, MD;  [facility]: [facility];  Service: Endoscopy;;  duodenum antral   BLADDER SUSPENSION  12/29/2010    Procedure: TRANSVAGINAL TAPE (TVT) PROCEDURE;  Surgeon: Loney Laurence;  Location: [facility];  Service: Gynecology;  Laterality: N/A;   BREAST LUMPECTOMY WITH RADIOACTIVE SEED AND SENTINEL LYMPH NODE BIOPSY Left 10/26/2021   Procedure: LEFT BREAST SEED LUMPECTOMY WITH LEFT SENTINEL LYMPH NODE MAPPING;  Surgeon: Harriette Bouillon, MD;  Location: [facility];  Service: General;  Laterality: Left;   BREAST SURGERY     left breast lumpectomy 1977   CATARACT EXTRACTION     COLONOSCOPY N/A 03/03/2016   Procedure: COLONOSCOPY;  Surgeon: Malissa Hippo, MD;  Location: [facility];  Service: Endoscopy;  Laterality: N/A;  2:00 - moved to 12/14 @ 12:55 - Ann notified pt   CYSTOCELE REPAIR  12/29/2010   Procedure: ANTERIOR REPAIR (CYSTOCELE);  Surgeon: Loney Laurence;  Location: [facility];  Service: Gynecology;  Laterality: N/A;   CYSTOSCOPY  12/29/2010   Procedure: CYSTOSCOPY;  Surgeon: Loney Laurence;  Location: [facility];  Service: Gynecology;  Laterality: N/A;   ESOPHAGOGASTRODUODENOSCOPY N/A 05/16/2019   Procedure: ESOPHAGOGASTRODUODENOSCOPY (EGD);  Surgeon: Malissa Hippo, MD;  [facility]: [facility];  Service: Endoscopy;  Laterality: N/A;   EYE SURGERY  2023   Cataracts   FRACTURE SURGERY  30 years ago   Broken ankle abs then broken left wrist in 2021   JOINT REPLACEMENT  2011   rt knee   PORTACATH PLACEMENT N/A 10/26/2021   Procedure: PORT PLACEMENT WITH ULTRASOUND GUIDANCE;  Surgeon: Harriette Bouillon, MD;  Location: [facility];  Service: General;  Laterality: N/A;   REPLACEMENT TOTAL KNEE Right 2011   TONSILLECTOMY     TOTAL KNEE ARTHROPLASTY Left 11/17/2014   Procedure: LEFT TOTAL KNEE ARTHROPLASTY;  Surgeon: Ollen Gross, MD;  Location: [facility];  Service: Orthopedics;  Laterality: Left;   TUBAL LIGATION     VAGINAL HYSTERECTOMY  12/29/2010   Procedure: HYSTERECTOMY VAGINAL;  Surgeon: Loney Laurence;  Location: [facility];  Service: Gynecology;   Laterality: N/A;   Family History  Problem Relation Age of Onset   Stroke Mother    Hypertension Mother    Hyperlipidemia Mother    Thyroid disease Mother    Arthritis Mother    Heart disease Father    COPD Father    Depression Father    Prostate cancer Father 48 - 11   Cancer Father    Breast cancer Paternal Grandmother 40 - 55   Cancer Paternal Grandmother    Diabetes Paternal Grandfather    Colon cancer Cousin  Depression Brother    Depression Brother    Social History   Socioeconomic History   Marital status: Married    Spouse name: Windy Fast   Number of children: Not on file   Years of education: Not on file   Highest education level: Not on file  Occupational History   Occupation: Retired  Tobacco Use   Smoking status: Never   Smokeless tobacco: Never  Vaping Use   Vaping Use: Never used  Substance and Sexual Activity   Alcohol use: No   Drug use: No   Sexual activity: Not Currently    Birth control/protection: Post-menopausal    Comment: HYST  Other Topics Concern   Not on file  Social History Narrative   Not on file   Social Determinants of Health   Financial Resource Strain: Low Risk  (08/19/2022)   Overall Financial Resource Strain (CARDIA)    Difficulty of Paying Living Expenses: Not hard at all  Food Insecurity: No Food Insecurity (08/19/2022)   Hunger Vital Sign    Worried About Running Out of Food in the Last Year: Never true    Ran Out of Food in the Last Year: Never true  Transportation Needs: Unknown (08/19/2022)   PRAPARE - Transportation    Lack of Transportation (Medical): No    Lack of Transportation (Non-Medical): Patient declined  Physical Activity: Insufficiently Active (08/19/2022)   Exercise Vital Sign    Days of Exercise per Week: 3 days    Minutes of Exercise per Session: 30 min  Stress: Stress Concern Present (08/15/2022)   [employer] of Occupational Health - Occupational Stress Questionnaire    Feeling of Stress : To  some extent  Social Connections: Socially Integrated (08/19/2022)   Social Connection and Isolation Panel [NHANES]    Frequency of Communication with Friends and Family: More than three times a week    Frequency of Social Gatherings with Friends and Family: More than three times a week    Attends Religious Services: More than 4 times per year    Active Member of [employer] or Organizations: Yes    Attends Engineer, structural: More than 4 times per year    Marital Status: Married    Tobacco Counseling Counseling given: Yes   Clinical Intake:  Pre-visit preparation completed: Yes  Pain : No/denies pain     BMI - recorded: 39.14 Nutritional Status: BMI > 30  Obese Nutritional Risks: None Diabetes: No  How often do you need to have someone help you when you read instructions, pamphlets, or other written materials from your doctor or pharmacy?: 1 - Never  Diabetic? NO  Interpreter Needed?: No  Information entered by :: Abby Kennethia Lynes, CMA   Activities of Daily Living    08/19/2022    8:30 AM 08/15/2022    9:02 AM  In your present state of health, do you have any difficulty performing the following activities:  Hearing? 0 0  Vision? 0 0  Difficulty concentrating or making decisions? 1 1  Walking or climbing stairs? 0 0  Dressing or bathing? 0 0  Doing errands, shopping? 0 0  Preparing Food and eating ? N N  Using the Toilet? Y Y  In the past six months, have you accidently leaked urine? Y Y  Do you have problems with loss of bowel control? Y Y  Managing your Medications? N N  Managing your Finances? N N  Housekeeping or managing your Housekeeping? N N    Patient Care  Team: Babs Sciara, MD as PCP - General (Family Medicine) Harriette Bouillon, MD as Consulting Physician (General Surgery) Serena Croissant, MD as Consulting Physician (Hematology and Oncology) Lonie Peak, MD as Attending Physician (Radiation Oncology)  Indicate any recent Medical Services you  may have received from other than [facility] providers in the past year (date may be approximate).     Assessment:   This is a routine wellness examination for Sara.  Hearing/Vision screen Hearing Screening - Comments:: Patient denies any hearing difficulties.   Vision Screening - Comments:: Wears rx glasses - up to date with routine eye exams with  Dr.Matthew Giegengack  Dietary issues and exercise activities discussed: Current Exercise Habits: Home exercise routine, Type of exercise: walking, Time (Minutes): 20, Frequency (Times/Week): 5, Weekly Exercise (Minutes/Week): 100, Intensity: Mild, Exercise limited by: orthopedic condition(s);Other - see comments (chemo treatments for breast cancer.)   Goals Addressed               This Visit's Progress     Patient Stated (pt-stated)        Patient stated her goal is to eat healthier and increase water intake. Her main goal is just to feel good again and have more energy. She is currently going through chemo treatments for breast cancer.        Depression Screen    08/19/2022    8:20 AM 08/10/2021    8:21 AM 01/06/2021   10:55 AM 08/20/2020    7:45 AM 02/11/2020    9:24 AM 07/20/2017    9:03 AM 07/03/2017    8:17 AM  PHQ 2/9 Scores  PHQ - 2 Score 0 0 0 3 0 6 2  PHQ- 9 Score    10  26     Fall Risk    08/19/2022    8:26 AM 08/15/2022    9:02 AM 08/10/2021    8:26 AM 08/06/2021    1:41 PM 01/06/2021   10:55 AM  Fall Risk   Falls in the past year? 0 0 0 0 0  Number falls in past yr: 0 0 0 0 0  Injury with Fall? 0 0 0 0 0  Risk for fall due to : No Fall Risks  No Fall Risks  No Fall Risks  Follow up Falls prevention discussed  Falls prevention discussed  Falls evaluation completed    FALL RISK PREVENTION PERTAINING TO THE HOME:  Any stairs in or around the home? No  If so, are there any without handrails? No  Home free of loose throw rugs in walkways, pet beds, electrical Vazquez, etc? Yes  Adequate lighting in your home to  reduce risk of falls? Yes   ASSISTIVE DEVICES UTILIZED TO PREVENT FALLS:  Life alert? No  Use of a cane, walker or w/c? No  Grab bars in the bathroom? Yes  Shower chair or bench in shower? No  Elevated toilet seat or a handicapped toilet? No   TIMED UP AND GO:  Was the test performed? No .  Cognitive Function:        08/19/2022    8:32 AM 08/10/2021    8:30 AM  6CIT Screen  What Year? 0 points 0 points  What month? 0 points 0 points  What time? 0 points 0 points  Count back from 20 0 points 0 points  Months in reverse 0 points 0 points  Repeat phrase 0 points 0 points  Total Score 0 points 0 points    Immunizations Immunization  History  Administered Date(s) Administered   Fluad Quad(high Dose 65+) 01/05/2022   Influenza Split 12/12/2012   Influenza,inj,Quad PF,6+ Mos 01/02/2017, 01/01/2018   Influenza,inj,quad, With Preservative 11/19/2016   Influenza-Unspecified 12/20/2014, 12/28/2015, 12/19/2017, 01/31/2018, 12/12/2018, 01/16/2020   Moderna Covid Bivalent Peds Booster(2mo Thru 51yrs) 01/29/2021   Moderna Sars-Covid-2 Vaccination 04/02/2019, 04/25/2019, 03/05/2020   Pneumococcal Polysaccharide-23 07/21/2020   Tdap 01/01/2018   Zoster Recombinat (Shingrix) 01/31/2018, 07/31/2020    TDAP status: Up to date  Flu Vaccine status: Up to date  Pneumococcal vaccine status: Due, Education has been provided regarding the importance of this vaccine. Advised may receive this vaccine at local pharmacy or Health Dept. Aware to provide a copy of the vaccination record if obtained from local pharmacy or Health Dept. Verbalized acceptance and understanding.  Covid-19 vaccine status: Information provided on how to obtain vaccines.   Qualifies for Shingles Vaccine? Yes   Zostavax completed Yes   Shingrix Completed?: Yes  Screening Tests Health Maintenance  Topic Date Due   Pneumonia Vaccine 24+ Years old (2 of 2 - PCV) 07/21/2021   COVID-19 Vaccine (5 - 2023-24 season)  11/19/2021   Colonoscopy  03/21/2023 (Originally 03/03/2021)   INFLUENZA VACCINE  10/20/2022   Medicare Annual Wellness (AWV)  08/19/2023   MAMMOGRAM  09/08/2023   DTaP/Tdap/Td (2 - Td or Tdap) 01/02/2028   DEXA SCAN  Completed   Hepatitis C Screening  Completed   Zoster Vaccines- Shingrix  Completed   HPV VACCINES  Aged Out    Health Maintenance  Health Maintenance Due  Topic Date Due   Pneumonia Vaccine 41+ Years old (2 of 2 - PCV) 07/21/2021   COVID-19 Vaccine (5 - 2023-24 season) 11/19/2021    Colorectal cancer screening: Type of screening: Colonoscopy. Completed 03/03/2016. Repeat every 5 years  Mammogram status: Completed 07/2021. Repeat every year  Bone Density status: Ordered 08/19/2022. Pt provided with contact info and advised to call to schedule appt.  Lung Cancer Screening: (Low Dose CT Chest recommended if Age 75-80 years, 30 pack-year currently smoking OR have quit w/in 15years.) does not qualify.   Lung Cancer Screening Referral: N/A  Additional Screening:  Hepatitis C Screening: does qualify; Completed 01/10/2017  Vision Screening: Recommended annual ophthalmology exams for early detection of glaucoma and other disorders of the eye. Is the patient up to date with their annual eye exam?  Yes  Who is the provider or what is the name of the office in which the patient attends annual eye exams? Dr. Phillips Climes If pt is not established with a provider, would they like to be referred to a provider to establish care?  N/a .   Dental Screening: Recommended annual dental exams for proper oral hygiene  Community Resource Referral / Chronic Care Management: CRR required this visit?  No   CCM required this visit?  No      Plan:     I have personally reviewed and noted the following in the patient's chart:   Medical and social history Use of alcohol, tobacco or illicit drugs  Current medications and supplements including opioid prescriptions. Patient is  not currently taking opioid prescriptions. Functional ability and status Nutritional status Physical activity Advanced directives List of other physicians Hospitalizations, surgeries, and ER visits in previous 12 months Vitals Screenings to include cognitive, depression, and falls Referrals and appointments  In addition, I have reviewed and discussed with patient certain preventive protocols, quality metrics, and best practice recommendations. A written personalized care plan for preventive  services as well as general preventive health recommendations were provided to patient.   Due to this being a telephonic visit, the after visit summary with patients personalized plan was offered to patient via mail or my-chart. Patient would like to access their AVS via my-chart    Jordan Hawks Tahmid Stonehocker, CMA   08/19/2022   Nurse Notes: Patient needs a refill on her Lyrica

## 2022-08-19 NOTE — Patient Instructions (Signed)
[name] , Thank you for taking time to come for your Medicare Wellness Visit. I appreciate your ongoing commitment to your health goals. Please review the following plan we discussed and let me know if I can assist you in the future.   These are the goals we discussed:  Goals       Exercise 3x per week (30 min per time)      Encouraged more movement.      Patient Stated (pt-stated)      Patient stated her goal is to eat healthier and increase water intake. Her main goal is just to feel good again and have more energy. She is currently going through chemo treatments for breast cancer.         This is a list of the screening recommended for you and due dates:  Health Maintenance  Topic Date Due   Pneumonia Vaccine (2 of 2 - PCV) 07/21/2021   COVID-19 Vaccine (5 - 2023-24 season) 11/19/2021   Colon Cancer Screening  03/21/2023*   Flu Shot  10/20/2022   Medicare Annual Wellness Visit  08/19/2023   Mammogram  09/08/2023   DTaP/Tdap/Td vaccine (2 - Td or Tdap) 01/02/2028   DEXA scan (bone density measurement)  Completed   Hepatitis C Screening  Completed   Zoster (Shingles) Vaccine  Completed   HPV Vaccine  Aged Out  *Topic was postponed. The date shown is not the original due date.    Advanced directives: Advance directive discussed with you today. Even though you declined this today, please call our office should you change your mind, and we can give you the proper paperwork for you to fill out. Advance care planning is a way to make decisions about medical care that fits your values in case you are ever unable to make these decisions for yourself.  Information on Advanced Care Planning can be found at [facility] [place] Advance Health Care Directives Advance Health Care Directives ([email or web address]    Conditions/risks identified: Aim for 30 minutes of exercise or brisk walking, 6-8 glasses of water, and 5 servings of fruits and vegetables each day.  KEEP DOING WHAT  YOU ARE DOING!!! YOU ARE DOING A FANTASTIC JOB!!  REFERRAL PLACED TODAY FOR THAT BONE DENSITY SCREENING. THEY WILL CALL YOU TO SCHEDULE THE APPOINTMENT.   Next appointment: Follow up in one year for your annual wellness visit August 25, 2023 AT 8AM OVER THE TELEPHONE   Preventive Care 65 Years and Older, Female Preventive care refers to lifestyle choices and visits with your health care provider that can promote health and wellness. What does preventive care include? A yearly physical exam. This is also called an annual well check. Dental exams once or twice a year. Routine eye exams. Ask your health care provider how often you should have your eyes checked. Personal lifestyle choices, including: Daily care of your teeth and gums. Regular physical activity. Eating a healthy diet. Avoiding tobacco and drug use. Limiting alcohol use. Practicing safe sex. Taking low-dose aspirin every day. Taking vitamin and mineral supplements as recommended by your health care provider. What happens during an annual well check? The services and screenings done by your health care provider during your annual well check will depend on your age, overall health, lifestyle risk factors, and family history of disease. Counseling  Your health care provider may ask you questions about your: Alcohol use. Tobacco use. Drug use. Emotional well-being. Home and relationship well-being. Sexual activity. Eating habits. History  of falls. Memory and ability to understand (cognition). Work and work Astronomer. Reproductive health. Screening  You may have the following tests or measurements: Height, weight, and BMI. Blood pressure. Lipid and cholesterol levels. These may be checked every 5 years, or more frequently if you are over 22 years old. Skin check. Lung cancer screening. You may have this screening every year starting at age 87 if you have a 30-pack-year history of smoking and currently smoke or have  quit within the past 15 years. Fecal occult blood test (FOBT) of the stool. You may have this test every year starting at age 85. Flexible sigmoidoscopy or colonoscopy. You may have a sigmoidoscopy every 5 years or a colonoscopy every 10 years starting at age 26. Hepatitis C blood test. Hepatitis B blood test. Sexually transmitted disease (STD) testing. Diabetes screening. This is done by checking your blood sugar (glucose) after you have not eaten for a while (fasting). You may have this done every 1-3 years. Bone density scan. This is done to screen for osteoporosis. You may have this done starting at age 3. Mammogram. This may be done every 1-2 years. Talk to your health care provider about how often you should have regular mammograms. Talk with your health care provider about your test results, treatment options, and if necessary, the need for more tests. Vaccines  Your health care provider may recommend certain vaccines, such as: Influenza vaccine. This is recommended every year. Tetanus, diphtheria, and acellular pertussis (Tdap, Td) vaccine. You may need a Td booster every 10 years. Zoster vaccine. You may need this after age 34. Pneumococcal 13-valent conjugate (PCV13) vaccine. One dose is recommended after age 57. Pneumococcal polysaccharide (PPSV23) vaccine. One dose is recommended after age 41. Talk to your health care provider about which screenings and vaccines you need and how often you need them. This information is not intended to replace advice given to you by your health care provider. Make sure you discuss any questions you have with your health care provider. Document Released: 04/03/2015 Document Revised: 11/25/2015 Document Reviewed: 01/06/2015 Elsevier Interactive Patient Education  2017 [employer].  Fall Prevention in the Home Falls can cause injuries. They can happen to people of all ages. There are many things you can do to make your home safe and to help prevent  falls. What can I do on the outside of my home? Regularly fix the edges of walkways and driveways and fix any cracks. Remove anything that might make you trip as you walk through a door, such as a raised step or threshold. Trim any bushes or trees on the path to your home. Use bright outdoor lighting. Clear any walking paths of anything that might make someone trip, such as rocks or tools. Regularly check to see if handrails are loose or broken. Make sure that both sides of any steps have handrails. Any raised decks and porches should have guardrails on the edges. Have any leaves, snow, or ice cleared regularly. Use sand or salt on walking paths during winter. Clean up any spills in your garage right away. This includes oil or grease spills. What can I do in the bathroom? Use night lights. Install grab bars by the toilet and in the tub and shower. Do not use towel bars as grab bars. Use non-skid mats or decals in the tub or shower. If you need to sit down in the shower, use a plastic, non-slip stool. Keep the floor dry. Clean up any water that spills on  the floor as soon as it happens. Remove soap buildup in the tub or shower regularly. Attach bath mats securely with double-sided non-slip rug tape. Do not have throw rugs and other things on the floor that can make you trip. What can I do in the bedroom? Use night lights. Make sure that you have a light by your bed that is easy to reach. Do not use any sheets or blankets that are too big for your bed. They should not hang down onto the floor. Have a firm chair that has side arms. You can use this for support while you get dressed. Do not have throw rugs and other things on the floor that can make you trip. What can I do in the kitchen? Clean up any spills right away. Avoid walking on wet floors. Keep items that you use a lot in easy-to-reach places. If you need to reach something above you, use a strong step stool that has a grab  bar. Keep electrical cords out of the way. Do not use floor polish or wax that makes floors slippery. If you must use wax, use non-skid floor wax. Do not have throw rugs and other things on the floor that can make you trip. What can I do with my stairs? Do not leave any items on the stairs. Make sure that there are handrails on both sides of the stairs and use them. Fix handrails that are broken or loose. Make sure that handrails are as long as the stairways. Check any carpeting to make sure that it is firmly attached to the stairs. Fix any carpet that is loose or worn. Avoid having throw rugs at the top or bottom of the stairs. If you do have throw rugs, attach them to the floor with carpet tape. Make sure that you have a light switch at the top of the stairs and the bottom of the stairs. If you do not have them, ask someone to add them for you. What else can I do to help prevent falls? Wear shoes that: Do not have high heels. Have rubber bottoms. Are comfortable and fit you well. Are closed at the toe. Do not wear sandals. If you use a stepladder: Make sure that it is fully opened. Do not climb a closed stepladder. Make sure that both sides of the stepladder are locked into place. Ask someone to hold it for you, if possible. Clearly mark and make sure that you can see: Any grab bars or handrails. First and last steps. Where the edge of each step is. Use tools that help you move around (mobility aids) if they are needed. These include: Canes. Walkers. Scooters. Crutches. Turn on the lights when you go into a dark area. Replace any light bulbs as soon as they burn out. Set up your furniture so you have a clear path. Avoid moving your furniture around. If any of your floors are uneven, fix them. If there are any pets around you, be aware of where they are. Review your medicines with your doctor. Some medicines can make you feel dizzy. This can increase your chance of falling. Ask  your doctor what other things that you can do to help prevent falls. This information is not intended to replace advice given to you by your health care provider. Make sure you discuss any questions you have with your health care provider. Document Released: 01/01/2009 Document Revised: 08/13/2015 Document Reviewed: 04/11/2014 Elsevier Interactive Patient Education  2017 [employer].

## 2022-08-20 ENCOUNTER — Other Ambulatory Visit: Payer: Self-pay

## 2022-08-24 ENCOUNTER — Inpatient Hospital Stay: Payer: Medicare Other | Attending: Hematology and Oncology

## 2022-08-24 ENCOUNTER — Inpatient Hospital Stay: Payer: Medicare Other

## 2022-08-24 VITALS — BP 142/73 | HR 84 | Resp 17

## 2022-08-24 DIAGNOSIS — C50412 Malignant neoplasm of upper-outer quadrant of left female breast: Secondary | ICD-10-CM | POA: Diagnosis not present

## 2022-08-24 DIAGNOSIS — Z170 Estrogen receptor positive status [ER+]: Secondary | ICD-10-CM | POA: Insufficient documentation

## 2022-08-24 DIAGNOSIS — Z5111 Encounter for antineoplastic chemotherapy: Secondary | ICD-10-CM | POA: Diagnosis present

## 2022-08-24 MED ORDER — SODIUM CHLORIDE 0.9% FLUSH
10.0000 mL | INTRAVENOUS | Status: DC | PRN
  Administered 2022-08-24: 10 mL

## 2022-08-24 MED ORDER — TRASTUZUMAB-DTTB CHEMO 150 MG IV SOLR
6.0000 mg/kg | Freq: Once | INTRAVENOUS | Status: AC
  Administered 2022-08-24: 600 mg via INTRAVENOUS
  Filled 2022-08-24: qty 28.57

## 2022-08-24 MED ORDER — DIPHENHYDRAMINE HCL 25 MG PO CAPS
50.0000 mg | ORAL_CAPSULE | Freq: Once | ORAL | Status: AC
  Administered 2022-08-24: 50 mg via ORAL
  Filled 2022-08-24: qty 2

## 2022-08-24 MED ORDER — ACETAMINOPHEN 325 MG PO TABS
650.0000 mg | ORAL_TABLET | Freq: Once | ORAL | Status: AC
  Administered 2022-08-24: 650 mg via ORAL
  Filled 2022-08-24: qty 2

## 2022-08-24 MED ORDER — SODIUM CHLORIDE 0.9 % IV SOLN: Freq: Once | INTRAVENOUS | Status: AC

## 2022-08-24 MED ORDER — HEPARIN SOD (PORK) LOCK FLUSH 100 UNIT/ML IV SOLN
500.0000 [IU] | Freq: Once | INTRAVENOUS | Status: AC | PRN
  Administered 2022-08-24: 500 [IU]

## 2022-08-24 NOTE — Patient Instructions (Signed)
[facility]  Discharge Instructions: Thank you for choosing [facility] to provide your oncology and hematology care.   If you have a lab appointment with the Cancer Center, please go directly to the Cancer Center and check in at the registration area.   Wear comfortable clothing and clothing appropriate for easy access to any Portacath or PICC line.   We strive to give you quality time with your provider. You may need to reschedule your appointment if you arrive late (15 or more minutes).  Arriving late affects you and other patients whose appointments are after yours.  Also, if you miss three or more appointments without notifying the office, you may be dismissed from the clinic at the provider's discretion.      For prescription refill requests, have your pharmacy contact our office and allow 72 hours for refills to be completed.    Today you received the following chemotherapy and/or immunotherapy agents: Trastuzumab      To help prevent nausea and vomiting after your treatment, we encourage you to take your nausea medication as directed.  BELOW ARE SYMPTOMS THAT SHOULD BE REPORTED IMMEDIATELY: *FEVER GREATER THAN 100.4 F (38 C) OR HIGHER *CHILLS OR SWEATING *NAUSEA AND VOMITING THAT IS NOT CONTROLLED WITH YOUR NAUSEA MEDICATION *UNUSUAL SHORTNESS OF BREATH *UNUSUAL BRUISING OR BLEEDING *URINARY PROBLEMS (pain or burning when urinating, or frequent urination) *BOWEL PROBLEMS (unusual diarrhea, constipation, pain near the anus) TENDERNESS IN MOUTH AND THROAT WITH OR WITHOUT PRESENCE OF ULCERS (sore throat, sores in mouth, or a toothache) UNUSUAL RASH, SWELLING OR PAIN  UNUSUAL VAGINAL DISCHARGE OR ITCHING   Items with * indicate a potential emergency and should be followed up as soon as possible or go to the Emergency Department if any problems should occur.  Please show the CHEMOTHERAPY ALERT CARD or IMMUNOTHERAPY ALERT CARD at  check-in to the Emergency Department and triage nurse.  Should you have questions after your visit or need to cancel or reschedule your appointment, please contact [facility]  Dept: [phone]  and follow the prompts.  Office hours are 8:00 a.m. to 4:30 p.m. Monday - Friday. Please note that voicemails left after 4:00 p.m. may not be returned until the following business day.  We are closed weekends and major holidays. You have access to a nurse at all times for urgent questions. Please call the main number to the clinic Dept: [phone] and follow the prompts.   For any non-urgent questions, you may also contact your provider using MyChart. We now offer e-Visits for anyone 18 and older to request care online for non-urgent symptoms. For details visit [email or web address].   Also download the MyChart app! Go to the app store, search "MyChart", open the app, select [facility], and log in with your MyChart username and password.  

## 2022-08-26 ENCOUNTER — Encounter: Payer: Self-pay | Admitting: Hematology and Oncology

## 2022-09-14 ENCOUNTER — Inpatient Hospital Stay: Payer: Medicare Other

## 2022-09-14 ENCOUNTER — Inpatient Hospital Stay: Payer: Medicare Other | Admitting: Hematology and Oncology

## 2022-09-14 ENCOUNTER — Other Ambulatory Visit: Payer: Self-pay

## 2022-09-14 VITALS — BP 146/90 | HR 73 | Temp 98.0°F | Resp 16 | Wt 228.0 lb

## 2022-09-14 DIAGNOSIS — Z170 Estrogen receptor positive status [ER+]: Secondary | ICD-10-CM

## 2022-09-14 DIAGNOSIS — Z5111 Encounter for antineoplastic chemotherapy: Secondary | ICD-10-CM | POA: Diagnosis not present

## 2022-09-14 DIAGNOSIS — Z95828 Presence of other vascular implants and grafts: Secondary | ICD-10-CM

## 2022-09-14 LAB — CMP (CANCER CENTER ONLY)
ALT: 13 U/L (ref 0–44)
AST: 17 U/L (ref 15–41)
Albumin: 3.8 g/dL (ref 3.5–5.0)
Alkaline Phosphatase: 103 U/L (ref 38–126)
Anion gap: 6 (ref 5–15)
BUN: 13 mg/dL (ref 8–23)
CO2: 28 mmol/L (ref 22–32)
Calcium: 9.4 mg/dL (ref 8.9–10.3)
Chloride: 106 mmol/L (ref 98–111)
Creatinine: 0.77 mg/dL (ref 0.44–1.00)
GFR, Estimated: >60 mL/min (ref >60)
Glucose, Bld: 90 mg/dL (ref 70–99)
Potassium: 3.8 mmol/L (ref 3.5–5.1)
Sodium: 140 mmol/L (ref 135–145)
Total Bilirubin: 0.6 mg/dL (ref 0.3–1.2)
Total Protein: 6.5 g/dL (ref 6.5–8.1)

## 2022-09-14 LAB — CBC WITH DIFFERENTIAL (CANCER CENTER ONLY)
Abs Immature Granulocytes: 0.01 10*3/uL (ref 0.00–0.07)
Basophils Absolute: 0 10*3/uL (ref 0.0–0.1)
Basophils Relative: 0 %
Eosinophils Absolute: 0.1 10*3/uL (ref 0.0–0.5)
Eosinophils Relative: 3 %
HCT: 39.2 % (ref 36.0–46.0)
Hemoglobin: 12.8 g/dL (ref 12.0–15.0)
Immature Granulocytes: 0 %
Lymphocytes Relative: 33 %
Lymphs Abs: 1.4 10*3/uL (ref 0.7–4.0)
MCH: 26.6 pg (ref 26.0–34.0)
MCHC: 32.7 g/dL (ref 30.0–36.0)
MCV: 81.5 fL (ref 80.0–100.0)
Monocytes Absolute: 0.4 10*3/uL (ref 0.1–1.0)
Monocytes Relative: 9 %
Neutro Abs: 2.3 10*3/uL (ref 1.7–7.7)
Neutrophils Relative %: 55 %
Platelet Count: 134 10*3/uL — ABNORMAL LOW (ref 150–400)
RBC: 4.81 MIL/uL (ref 3.87–5.11)
RDW: 17.1 % — ABNORMAL HIGH (ref 11.5–15.5)
WBC Count: 4.2 10*3/uL (ref 4.0–10.5)
nRBC: 0 % (ref 0.0–0.2)

## 2022-09-14 LAB — IRON AND IRON BINDING CAPACITY (CC-WL,HP ONLY)
Iron: 82 ug/dL (ref 28–170)
Saturation Ratios: 22 % (ref 10.4–31.8)
TIBC: 370 ug/dL (ref 250–450)
UIBC: 288 ug/dL (ref 148–442)

## 2022-09-14 LAB — FERRITIN: Ferritin: 46 ng/mL (ref 11–307)

## 2022-09-14 MED ORDER — SODIUM CHLORIDE 0.9% FLUSH
10.0000 mL | Freq: Once | INTRAVENOUS | Status: AC
  Administered 2022-09-14: 10 mL

## 2022-09-14 MED ORDER — SODIUM CHLORIDE 0.9 % IV SOLN: Freq: Once | INTRAVENOUS | Status: AC

## 2022-09-14 MED ORDER — DIPHENHYDRAMINE HCL 25 MG PO CAPS
50.0000 mg | ORAL_CAPSULE | Freq: Once | ORAL | Status: AC
  Administered 2022-09-14: 50 mg via ORAL
  Filled 2022-09-14: qty 2

## 2022-09-14 MED ORDER — SODIUM CHLORIDE 0.9% FLUSH
10.0000 mL | INTRAVENOUS | Status: DC | PRN
  Administered 2022-09-14: 10 mL

## 2022-09-14 MED ORDER — ACETAMINOPHEN 325 MG PO TABS
650.0000 mg | ORAL_TABLET | Freq: Once | ORAL | Status: AC
  Administered 2022-09-14: 650 mg via ORAL
  Filled 2022-09-14: qty 2

## 2022-09-14 MED ORDER — HEPARIN SOD (PORK) LOCK FLUSH 100 UNIT/ML IV SOLN
500.0000 [IU] | Freq: Once | INTRAVENOUS | Status: AC | PRN
  Administered 2022-09-14: 500 [IU]

## 2022-09-14 MED ORDER — TRASTUZUMAB-DTTB CHEMO 150 MG IV SOLR
6.0000 mg/kg | Freq: Once | INTRAVENOUS | Status: AC
  Administered 2022-09-14: 600 mg via INTRAVENOUS
  Filled 2022-09-14: qty 28.57

## 2022-09-14 NOTE — Patient Instructions (Signed)
[facility]  Discharge Instructions: Thank you for choosing [facility] to provide your oncology and hematology care.   If you have a lab appointment with the Cancer Center, please go directly to the Cancer Center and check in at the registration area.   Wear comfortable clothing and clothing appropriate for easy access to any Portacath or PICC line.   We strive to give you quality time with your provider. You may need to reschedule your appointment if you arrive late (15 or more minutes).  Arriving late affects you and other patients whose appointments are after yours.  Also, if you miss three or more appointments without notifying the office, you may be dismissed from the clinic at the provider's discretion.      For prescription refill requests, have your pharmacy contact our office and allow 72 hours for refills to be completed.    Today you received the following chemotherapy and/or immunotherapy agents: Ontruzant      To help prevent nausea and vomiting after your treatment, we encourage you to take your nausea medication as directed.  BELOW ARE SYMPTOMS THAT SHOULD BE REPORTED IMMEDIATELY: *FEVER GREATER THAN 100.4 F (38 C) OR HIGHER *CHILLS OR SWEATING *NAUSEA AND VOMITING THAT IS NOT CONTROLLED WITH YOUR NAUSEA MEDICATION *UNUSUAL SHORTNESS OF BREATH *UNUSUAL BRUISING OR BLEEDING *URINARY PROBLEMS (pain or burning when urinating, or frequent urination) *BOWEL PROBLEMS (unusual diarrhea, constipation, pain near the anus) TENDERNESS IN MOUTH AND THROAT WITH OR WITHOUT PRESENCE OF ULCERS (sore throat, sores in mouth, or a toothache) UNUSUAL RASH, SWELLING OR PAIN  UNUSUAL VAGINAL DISCHARGE OR ITCHING   Items with * indicate a potential emergency and should be followed up as soon as possible or go to the Emergency Department if any problems should occur.  Please show the CHEMOTHERAPY ALERT CARD or IMMUNOTHERAPY ALERT CARD at  check-in to the Emergency Department and triage nurse.  Should you have questions after your visit or need to cancel or reschedule your appointment, please contact [facility]  Dept: [phone]  and follow the prompts.  Office hours are 8:00 a.m. to 4:30 p.m. Monday - Friday. Please note that voicemails left after 4:00 p.m. may not be returned until the following business day.  We are closed weekends and major holidays. You have access to a nurse at all times for urgent questions. Please call the main number to the clinic Dept: [phone] and follow the prompts.   For any non-urgent questions, you may also contact your provider using MyChart. We now offer e-Visits for anyone 18 and older to request care online for non-urgent symptoms. For details visit [email or web address].   Also download the MyChart app! Go to the app store, search "MyChart", open the app, select , and log in with your MyChart username and password.   

## 2022-09-26 ENCOUNTER — Ambulatory Visit
Admission: RE | Admit: 2022-09-26 | Discharge: 2022-09-26 | Disposition: A | Discharge status: Home / Self Care | Payer: Medicare Other | Source: Ambulatory Visit | Attending: Adult Health | Admitting: Adult Health

## 2022-09-26 DIAGNOSIS — C50412 Malignant neoplasm of upper-outer quadrant of left female breast: Secondary | ICD-10-CM

## 2022-09-26 HISTORY — DX: Personal history of antineoplastic chemotherapy: Z92.21

## 2022-09-26 HISTORY — DX: Personal history of irradiation: Z92.3

## 2022-10-03 ENCOUNTER — Other Ambulatory Visit: Payer: Self-pay

## 2022-10-03 DIAGNOSIS — D509 Iron deficiency anemia, unspecified: Secondary | ICD-10-CM

## 2022-10-03 DIAGNOSIS — C50412 Malignant neoplasm of upper-outer quadrant of left female breast: Secondary | ICD-10-CM

## 2022-10-03 NOTE — Progress Notes (Signed)
Patient Care Team: Babs Sciara, MD as PCP - General (Family Medicine) Harriette Bouillon, MD as Consulting Physician (General Surgery) Serena Croissant, MD as Consulting Physician (Hematology and Oncology) Lonie Peak, MD as Attending Physician (Radiation Oncology)  DIAGNOSIS: No diagnosis found.  SUMMARY OF ONCOLOGIC HISTORY: Oncology History  Malignant neoplasm of upper-outer quadrant of left breast in female, estrogen receptor positive (HCC)  09/07/2021 Initial Diagnosis   Screening mammogram detected left breast mass by ultrasound 2 o'clock position measured 1.4 cm axilla was negative.  Ultrasound-guided biopsy revealed grade 2 IDC with focal necrosis ER 90% weak, PR 0%, Ki-67 55%, HER2 3+ positive   09/15/2021 Cancer Staging   Staging form: Breast, AJCC 8th Edition - Clinical: Stage IA (cT1b, cN0, cM0, G3, ER-, PR-, HER2+) - Signed by Lonie Peak, MD on 03/22/2022 Stage prefix: Initial diagnosis Histologic grading system: 3 grade system    Genetic Testing   [facility] CustomNext Panel was Negative. Report date is 10/04/2021.  The CustomNext-Cancer+RNAinsight panel offered by [employer] includes sequencing and rearrangement analysis for the following 47 genes:  APC, ATM, AXIN2, BARD1, BMPR1A, BRCA1, BRCA2, BRIP1, CDH1, CDK4, CDKN2A, CHEK2, CTNNA1, DICER1, EPCAM, GREM1, HOXB13, KIT, MEN1, MLH1, MSH2, MSH3, MSH6, MUTYH, NBN, NF1, NTHL1, PALB2, PDGFRA, PMS2, POLD1, POLE, PTEN, RAD50, RAD51C, RAD51D, SDHA, SDHB, SDHC, SDHD, SMAD4, SMARCA4, STK11, TP53, TSC1, TSC2, and VHL.  RNA data is routinely analyzed for use in variant interpretation for all genes.   10/26/2021 Surgery   Left lumpectomy: Grade 3 IDC 1.9 cm with high-grade DCIS, margins negative, 0/3 lymph nodes negative, ER 90%, PR 0%, HER2 3+ positive, Ki-67 55%    10/26/2021 Cancer Staging   Staging form: Breast, AJCC 8th Edition - Pathologic stage from 10/26/2021: Stage IA (pT1c, pN0, cM0, G3, ER+, PR-, HER2+) - Signed by Loa Socks, NP on 05/12/2022 Stage prefix: Initial diagnosis Histologic grading system: 3 grade system   11/25/2021 -  Chemotherapy   Patient is on Treatment Plan : BREAST Paclitaxel + Trastuzumab q7d / Trastuzumab q21d     03/16/2022 - 04/13/2022 Radiation Therapy   40.05 Gy in 15 treatments "Boost": 10 Gy in 5 treatments   04/2022 -  Anti-estrogen oral therapy   Anastrozole x 7 years     CHIEF COMPLIANT: Follow up herceptin   INTERVAL HISTORY: Karen Vazquez is a 67 y.o with the above mentioned left lumpectomy followed by adjuvant Taxol and herceptin. She presents to the clinic for a follow-up.     ALLERGIES:  is allergic to lisinopril.  MEDICATIONS:  Current Outpatient Medications  Medication Sig Dispense Refill   ALPRAZolam (XANAX) 0.5 MG tablet TAKE ONE TABLET BY MOUTH TWO TIMES DAILYAS NEEDED FOR ANXIETY OR SLEEP. 30 tablet 5   amLODipine (NORVASC) 2.5 MG tablet Take 1 tablet (2.5 mg total) by mouth daily. 30 tablet 5   anastrozole (ARIMIDEX) 1 MG tablet Take 1 tablet (1 mg total) by mouth daily. 90 tablet 3   ARMOUR THYROID 15 MG tablet TAKE ONE TABLET ONCE DAILY 30 tablet 5   diphenoxylate-atropine (LOMOTIL) 2.5-0.025 MG tablet Take 1 tablet by mouth 4 (four) times daily as needed for diarrhea or loose stools. 30 tablet 1   fluticasone (FLONASE) 50 MCG/ACT nasal spray USE 2 SPRAYS IN EACH NOSTRIL DAILY 16 g 1   lidocaine-prilocaine (EMLA) cream Apply to affected area once 30 g 3   oxybutynin (DITROPAN-XL) 5 MG 24 hr tablet Take by mouth.     pantoprazole (PROTONIX) 40 MG tablet  Take 1 tablet (40 mg total) by mouth daily. 30 tablet 5   pregabalin (LYRICA) 50 MG capsule 1 each evening 30 capsule 5   thyroid (ARMOUR THYROID) 30 MG tablet TAKE ONE (1) TABLET BY MOUTH EVERY DAY 30 tablet 5   No current facility-administered medications for this visit.    PHYSICAL EXAMINATION: ECOG PERFORMANCE STATUS: {CHL ONC ECOG [phone]}  There were no vitals filed for this  visit. There were no vitals filed for this visit.  BREAST:*** No palpable masses or nodules in either right or left breasts. No palpable axillary supraclavicular or infraclavicular adenopathy no breast tenderness or nipple discharge. (exam performed in the presence of a chaperone)  LABORATORY DATA:  I have reviewed the data as listed    Latest Ref Rng & Units 09/14/2022    8:43 AM 08/03/2022    1:48 PM 07/14/2022    2:14 PM  CMP  Glucose 70 - 99 mg/dL 90  161  096   BUN 8 - 23 mg/dL 13  13  15    Creatinine 0.44 - 1.00 mg/dL 0.45  4.09  8.11   Sodium 135 - 145 mmol/L 140  141  140   Potassium 3.5 - 5.1 mmol/L 3.8  4.1  4.2   Chloride 98 - 111 mmol/L 106  107  105   CO2 22 - 32 mmol/L 28  29  30    Calcium 8.9 - 10.3 mg/dL 9.4  9.1  9.6   Total Protein 6.5 - 8.1 g/dL 6.5  6.4  6.7   Total Bilirubin 0.3 - 1.2 mg/dL 0.6  0.4  0.5   Alkaline Phos 38 - 126 U/L 103  98  118   AST 15 - 41 U/L 17  17  17    ALT 0 - 44 U/L 13  13  12      Lab Results  Component Value Date   WBC 4.2 09/14/2022   HGB 12.8 09/14/2022   HCT 39.2 09/14/2022   MCV 81.5 09/14/2022   PLT 134 (L) 09/14/2022   NEUTROABS 2.3 09/14/2022    ASSESSMENT & PLAN:  No problem-specific Assessment & Plan notes found for this encounter.    No orders of the defined types were placed in this encounter.  The patient has a good understanding of the overall plan. she agrees with it. she will call with any problems that may develop before the next visit here. Total time spent: 30 mins including face to face time and time spent for planning, charting and co-ordination of care   Sherlyn Lick, CMA 10/03/22    I Janan Ridge am acting as a Neurosurgeon for [employer]  ***

## 2022-10-05 ENCOUNTER — Inpatient Hospital Stay: Payer: Medicare Other

## 2022-10-05 ENCOUNTER — Inpatient Hospital Stay: Payer: Medicare Other | Attending: Hematology and Oncology

## 2022-10-05 ENCOUNTER — Inpatient Hospital Stay (HOSPITAL_BASED_OUTPATIENT_CLINIC_OR_DEPARTMENT_OTHER): Payer: Medicare Other | Admitting: Hematology and Oncology

## 2022-10-05 ENCOUNTER — Other Ambulatory Visit: Payer: Self-pay

## 2022-10-05 VITALS — BP 146/80 | HR 81 | Temp 97.5°F | Resp 18 | Ht 64.0 in | Wt 229.5 lb

## 2022-10-05 DIAGNOSIS — Z9221 Personal history of antineoplastic chemotherapy: Secondary | ICD-10-CM | POA: Diagnosis not present

## 2022-10-05 DIAGNOSIS — Z79811 Long term (current) use of aromatase inhibitors: Secondary | ICD-10-CM | POA: Diagnosis not present

## 2022-10-05 DIAGNOSIS — Z5111 Encounter for antineoplastic chemotherapy: Secondary | ICD-10-CM | POA: Diagnosis present

## 2022-10-05 DIAGNOSIS — C50412 Malignant neoplasm of upper-outer quadrant of left female breast: Secondary | ICD-10-CM | POA: Diagnosis not present

## 2022-10-05 DIAGNOSIS — Z79899 Other long term (current) drug therapy: Secondary | ICD-10-CM | POA: Diagnosis not present

## 2022-10-05 DIAGNOSIS — Z79890 Hormone replacement therapy (postmenopausal): Secondary | ICD-10-CM | POA: Insufficient documentation

## 2022-10-05 DIAGNOSIS — D509 Iron deficiency anemia, unspecified: Secondary | ICD-10-CM

## 2022-10-05 DIAGNOSIS — Z170 Estrogen receptor positive status [ER+]: Secondary | ICD-10-CM

## 2022-10-05 DIAGNOSIS — Z923 Personal history of irradiation: Secondary | ICD-10-CM | POA: Diagnosis not present

## 2022-10-05 DIAGNOSIS — Z95828 Presence of other vascular implants and grafts: Secondary | ICD-10-CM

## 2022-10-05 LAB — CBC WITH DIFFERENTIAL (CANCER CENTER ONLY)
Abs Immature Granulocytes: 0.01 10*3/uL (ref 0.00–0.07)
Basophils Absolute: 0 10*3/uL (ref 0.0–0.1)
Basophils Relative: 0 %
Eosinophils Absolute: 0.1 10*3/uL (ref 0.0–0.5)
Eosinophils Relative: 3 %
HCT: 40 % (ref 36.0–46.0)
Hemoglobin: 13.3 g/dL (ref 12.0–15.0)
Immature Granulocytes: 0 %
Lymphocytes Relative: 33 %
Lymphs Abs: 1.5 10*3/uL (ref 0.7–4.0)
MCH: 27.5 pg (ref 26.0–34.0)
MCHC: 33.3 g/dL (ref 30.0–36.0)
MCV: 82.8 fL (ref 80.0–100.0)
Monocytes Absolute: 0.3 10*3/uL (ref 0.1–1.0)
Monocytes Relative: 7 %
Neutro Abs: 2.5 10*3/uL (ref 1.7–7.7)
Neutrophils Relative %: 57 %
Platelet Count: 138 10*3/uL — ABNORMAL LOW (ref 150–400)
RBC: 4.83 MIL/uL (ref 3.87–5.11)
RDW: 15 % (ref 11.5–15.5)
WBC Count: 4.5 10*3/uL (ref 4.0–10.5)
nRBC: 0 % (ref 0.0–0.2)

## 2022-10-05 LAB — IRON AND IRON BINDING CAPACITY (CC-WL,HP ONLY)
Iron: 81 ug/dL (ref 28–170)
Saturation Ratios: 22 % (ref 10.4–31.8)
TIBC: 367 ug/dL (ref 250–450)
UIBC: 286 ug/dL (ref 148–442)

## 2022-10-05 LAB — CMP (CANCER CENTER ONLY)
ALT: 12 U/L (ref 0–44)
AST: 16 U/L (ref 15–41)
Albumin: 4.1 g/dL (ref 3.5–5.0)
Alkaline Phosphatase: 108 U/L (ref 38–126)
Anion gap: 6 (ref 5–15)
BUN: 16 mg/dL (ref 8–23)
CO2: 28 mmol/L (ref 22–32)
Calcium: 9.7 mg/dL (ref 8.9–10.3)
Chloride: 107 mmol/L (ref 98–111)
Creatinine: 0.81 mg/dL (ref 0.44–1.00)
GFR, Estimated: >60 mL/min (ref >60)
Glucose, Bld: 83 mg/dL (ref 70–99)
Potassium: 4 mmol/L (ref 3.5–5.1)
Sodium: 141 mmol/L (ref 135–145)
Total Bilirubin: 0.6 mg/dL (ref 0.3–1.2)
Total Protein: 6.6 g/dL (ref 6.5–8.1)

## 2022-10-05 LAB — FERRITIN: Ferritin: 40 ng/mL (ref 11–307)

## 2022-10-05 MED ORDER — SODIUM CHLORIDE 0.9% FLUSH
10.0000 mL | Freq: Once | INTRAVENOUS | Status: AC
  Administered 2022-10-05: 10 mL

## 2022-10-05 MED ORDER — SODIUM CHLORIDE 0.9 % IV SOLN: Freq: Once | INTRAVENOUS | Status: AC

## 2022-10-05 MED ORDER — DIPHENHYDRAMINE HCL 25 MG PO CAPS
50.0000 mg | ORAL_CAPSULE | Freq: Once | ORAL | Status: AC
  Administered 2022-10-05: 50 mg via ORAL
  Filled 2022-10-05: qty 2

## 2022-10-05 MED ORDER — ACETAMINOPHEN 325 MG PO TABS
650.0000 mg | ORAL_TABLET | Freq: Once | ORAL | Status: AC
  Administered 2022-10-05: 650 mg via ORAL
  Filled 2022-10-05: qty 2

## 2022-10-05 MED ORDER — TRASTUZUMAB-DTTB CHEMO 150 MG IV SOLR
6.0000 mg/kg | Freq: Once | INTRAVENOUS | Status: AC
  Administered 2022-10-05: 600 mg via INTRAVENOUS
  Filled 2022-10-05: qty 28.57

## 2022-10-05 MED ORDER — SODIUM CHLORIDE 0.9% FLUSH
10.0000 mL | INTRAVENOUS | Status: DC | PRN
  Administered 2022-10-05: 10 mL

## 2022-10-05 MED ORDER — HEPARIN SOD (PORK) LOCK FLUSH 100 UNIT/ML IV SOLN
500.0000 [IU] | Freq: Once | INTRAVENOUS | Status: AC | PRN
  Administered 2022-10-05: 500 [IU]

## 2022-10-05 NOTE — Patient Instructions (Signed)
[facility]  Discharge Instructions: Thank you for choosing [facility] to provide your oncology and hematology care.   If you have a lab appointment with the Cancer Center, please go directly to the Cancer Center and check in at the registration area.   Wear comfortable clothing and clothing appropriate for easy access to any Portacath or PICC line.   We strive to give you quality time with your provider. You may need to reschedule your appointment if you arrive late (15 or more minutes).  Arriving late affects you and other patients whose appointments are after yours.  Also, if you miss three or more appointments without notifying the office, you may be dismissed from the clinic at the provider's discretion.      For prescription refill requests, have your pharmacy contact our office and allow 72 hours for refills to be completed.    Today you received the following chemotherapy and/or immunotherapy agents: Ontruzant      To help prevent nausea and vomiting after your treatment, we encourage you to take your nausea medication as directed.  BELOW ARE SYMPTOMS THAT SHOULD BE REPORTED IMMEDIATELY: *FEVER GREATER THAN 100.4 F (38 C) OR HIGHER *CHILLS OR SWEATING *NAUSEA AND VOMITING THAT IS NOT CONTROLLED WITH YOUR NAUSEA MEDICATION *UNUSUAL SHORTNESS OF BREATH *UNUSUAL BRUISING OR BLEEDING *URINARY PROBLEMS (pain or burning when urinating, or frequent urination) *BOWEL PROBLEMS (unusual diarrhea, constipation, pain near the anus) TENDERNESS IN MOUTH AND THROAT WITH OR WITHOUT PRESENCE OF ULCERS (sore throat, sores in mouth, or a toothache) UNUSUAL RASH, SWELLING OR PAIN  UNUSUAL VAGINAL DISCHARGE OR ITCHING   Items with * indicate a potential emergency and should be followed up as soon as possible or go to the Emergency Department if any problems should occur.  Please show the CHEMOTHERAPY ALERT CARD or IMMUNOTHERAPY ALERT CARD at  check-in to the Emergency Department and triage nurse.  Should you have questions after your visit or need to cancel or reschedule your appointment, please contact [facility]  Dept: [phone]  and follow the prompts.  Office hours are 8:00 a.m. to 4:30 p.m. Monday - Friday. Please note that voicemails left after 4:00 p.m. may not be returned until the following business day.  We are closed weekends and major holidays. You have access to a nurse at all times for urgent questions. Please call the main number to the clinic Dept: [phone] and follow the prompts.   For any non-urgent questions, you may also contact your provider using MyChart. We now offer e-Visits for anyone 18 and older to request care online for non-urgent symptoms. For details visit [email or web address].   Also download the MyChart app! Go to the app store, search "MyChart", open the app, select , and log in with your MyChart username and password.   

## 2022-10-05 NOTE — Assessment & Plan Note (Addendum)
09/07/2021:Screening mammogram detected left breast mass by ultrasound 2 o'clock position measured 1.4 cm axilla was negative.  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 adjuvant chemotherapy with Taxol Herceptin weekly x12 followed by Herceptin maintenance every 3 weeks for 1 year 2.  Adjuvant radiation therapy until 04/13/2022 3.  Adjuvant antiestrogen therapy ----------------------------------------------------------------------------------------------------------------------------- Current treatment: Herceptin maintenance therapy (until September 2024) Toxicities: Intermittent diarrhea Echocardiogram  08/05/2022: EF 60 to 65%  Anastrozole toxicities: Tolerating it well without any major problems   Microcytic anemia: Improved with iron infusions.  Today's hemoglobin is 12.2 we will add ferritin to the next set of labs in 6 weeks.   Breast cancer surveillance: Breast exam 08/03/2022: Benign Mammogram 09/26/2022: Benign density category B   Return to clinic every 3 weeks for Herceptin infusions.  (Until end of August)

## 2022-10-06 ENCOUNTER — Telehealth: Payer: Self-pay

## 2022-10-06 ENCOUNTER — Encounter: Payer: Self-pay | Admitting: *Deleted

## 2022-10-06 DIAGNOSIS — Z170 Estrogen receptor positive status [ER+]: Secondary | ICD-10-CM

## 2022-10-06 NOTE — Telephone Encounter (Signed)
Per md orders entered for signatera. Requisition and all supported documents faxed to [phone]. Faxed confirmation was received.  

## 2022-10-10 ENCOUNTER — Telehealth: Payer: Self-pay | Admitting: Hematology and Oncology

## 2022-10-10 NOTE — Telephone Encounter (Signed)
Scheduled appointment per 7/17 los. Patient is aware of the made appointment.

## 2022-10-24 ENCOUNTER — Encounter: Payer: Self-pay | Admitting: Family Medicine

## 2022-10-24 DIAGNOSIS — E038 Other specified hypothyroidism: Secondary | ICD-10-CM

## 2022-10-24 DIAGNOSIS — E7849 Other hyperlipidemia: Secondary | ICD-10-CM

## 2022-10-24 NOTE — Telephone Encounter (Signed)
Nurses Please order lipid, TSH, free T4 Diagnosis hypothyroidism, hyperlipidemia  I will send a message to Dr. Layla Maw may notify Karen Vazquez that we have ordered additional labs and hopefully her oncologist would be willing to draw these when she goes on Friday we will see her next Monday thank you

## 2022-10-25 ENCOUNTER — Other Ambulatory Visit: Payer: Self-pay | Admitting: Hematology and Oncology

## 2022-10-25 ENCOUNTER — Encounter: Payer: Self-pay | Admitting: Family Medicine

## 2022-10-25 DIAGNOSIS — E039 Hypothyroidism, unspecified: Secondary | ICD-10-CM

## 2022-10-26 ENCOUNTER — Encounter: Payer: Self-pay | Admitting: Hematology and Oncology

## 2022-10-27 ENCOUNTER — Other Ambulatory Visit: Payer: Self-pay

## 2022-10-27 DIAGNOSIS — Z170 Estrogen receptor positive status [ER+]: Secondary | ICD-10-CM

## 2022-10-27 DIAGNOSIS — E039 Hypothyroidism, unspecified: Secondary | ICD-10-CM

## 2022-10-28 ENCOUNTER — Inpatient Hospital Stay: Payer: Medicare Other | Attending: Hematology and Oncology

## 2022-10-28 ENCOUNTER — Inpatient Hospital Stay: Payer: Medicare Other

## 2022-10-28 VITALS — BP 133/71 | HR 73 | Temp 98.5°F | Resp 20 | Wt 230.4 lb

## 2022-10-28 DIAGNOSIS — Z95828 Presence of other vascular implants and grafts: Secondary | ICD-10-CM

## 2022-10-28 DIAGNOSIS — C50412 Malignant neoplasm of upper-outer quadrant of left female breast: Secondary | ICD-10-CM | POA: Insufficient documentation

## 2022-10-28 DIAGNOSIS — Z79899 Other long term (current) drug therapy: Secondary | ICD-10-CM | POA: Diagnosis not present

## 2022-10-28 DIAGNOSIS — E039 Hypothyroidism, unspecified: Secondary | ICD-10-CM

## 2022-10-28 DIAGNOSIS — Z170 Estrogen receptor positive status [ER+]: Secondary | ICD-10-CM | POA: Insufficient documentation

## 2022-10-28 DIAGNOSIS — Z5111 Encounter for antineoplastic chemotherapy: Secondary | ICD-10-CM | POA: Insufficient documentation

## 2022-10-28 LAB — CBC WITH DIFFERENTIAL (CANCER CENTER ONLY)
Abs Immature Granulocytes: 0.02 10*3/uL (ref 0.00–0.07)
Basophils Absolute: 0 10*3/uL (ref 0.0–0.1)
Basophils Relative: 0 %
Eosinophils Absolute: 0.1 10*3/uL (ref 0.0–0.5)
Eosinophils Relative: 2 %
HCT: 40.1 % (ref 36.0–46.0)
Hemoglobin: 13.3 g/dL (ref 12.0–15.0)
Immature Granulocytes: 0 %
Lymphocytes Relative: 26 %
Lymphs Abs: 1.6 10*3/uL (ref 0.7–4.0)
MCH: 28.1 pg (ref 26.0–34.0)
MCHC: 33.2 g/dL (ref 30.0–36.0)
MCV: 84.6 fL (ref 80.0–100.0)
Monocytes Absolute: 0.4 10*3/uL (ref 0.1–1.0)
Monocytes Relative: 7 %
Neutro Abs: 4 10*3/uL (ref 1.7–7.7)
Neutrophils Relative %: 65 %
Platelet Count: 170 10*3/uL (ref 150–400)
RBC: 4.74 MIL/uL (ref 3.87–5.11)
RDW: 13.8 % (ref 11.5–15.5)
WBC Count: 6.2 10*3/uL (ref 4.0–10.5)
nRBC: 0 % (ref 0.0–0.2)

## 2022-10-28 LAB — LIPID PANEL
Cholesterol: 173 mg/dL (ref 0–200)
HDL: 34 mg/dL — ABNORMAL LOW (ref >40)
LDL Cholesterol: 86 mg/dL (ref 0–99)
Total CHOL/HDL Ratio: 5.1 RATIO
Triglycerides: 265 mg/dL — ABNORMAL HIGH (ref <150)
VLDL: 53 mg/dL — ABNORMAL HIGH (ref 0–40)

## 2022-10-28 LAB — CMP (CANCER CENTER ONLY)
ALT: 11 U/L (ref 0–44)
AST: 16 U/L (ref 15–41)
Albumin: 4.1 g/dL (ref 3.5–5.0)
Alkaline Phosphatase: 107 U/L (ref 38–126)
Anion gap: 6 (ref 5–15)
BUN: 10 mg/dL (ref 8–23)
CO2: 28 mmol/L (ref 22–32)
Calcium: 9 mg/dL (ref 8.9–10.3)
Chloride: 108 mmol/L (ref 98–111)
Creatinine: 1.07 mg/dL — ABNORMAL HIGH (ref 0.44–1.00)
GFR, Estimated: 57 mL/min — ABNORMAL LOW (ref >60)
Glucose, Bld: 90 mg/dL (ref 70–99)
Potassium: 3.9 mmol/L (ref 3.5–5.1)
Sodium: 142 mmol/L (ref 135–145)
Total Bilirubin: 0.4 mg/dL (ref 0.3–1.2)
Total Protein: 6.4 g/dL — ABNORMAL LOW (ref 6.5–8.1)

## 2022-10-28 LAB — IRON AND IRON BINDING CAPACITY (CC-WL,HP ONLY)
Iron: 43 ug/dL (ref 28–170)
Saturation Ratios: 12 % (ref 10.4–31.8)
TIBC: 363 ug/dL (ref 250–450)
UIBC: 320 ug/dL (ref 148–442)

## 2022-10-28 LAB — T4, FREE: Free T4: 0.9 ng/dL (ref 0.61–1.12)

## 2022-10-28 LAB — FERRITIN: Ferritin: 38 ng/mL (ref 11–307)

## 2022-10-28 MED ORDER — DIPHENHYDRAMINE HCL 25 MG PO CAPS
50.0000 mg | ORAL_CAPSULE | Freq: Once | ORAL | Status: AC
  Administered 2022-10-28: 50 mg via ORAL
  Filled 2022-10-28: qty 2

## 2022-10-28 MED ORDER — SODIUM CHLORIDE 0.9% FLUSH
10.0000 mL | Freq: Once | INTRAVENOUS | Status: AC
  Administered 2022-10-28: 10 mL

## 2022-10-28 MED ORDER — ACETAMINOPHEN 325 MG PO TABS
650.0000 mg | ORAL_TABLET | Freq: Once | ORAL | Status: AC
  Administered 2022-10-28: 650 mg via ORAL
  Filled 2022-10-28: qty 2

## 2022-10-28 MED ORDER — SODIUM CHLORIDE 0.9% FLUSH
10.0000 mL | INTRAVENOUS | Status: DC | PRN
  Administered 2022-10-28: 10 mL

## 2022-10-28 MED ORDER — HEPARIN SOD (PORK) LOCK FLUSH 100 UNIT/ML IV SOLN
500.0000 [IU] | Freq: Once | INTRAVENOUS | Status: AC | PRN
  Administered 2022-10-28: 500 [IU]

## 2022-10-28 MED ORDER — TRASTUZUMAB-DTTB CHEMO 150 MG IV SOLR
6.0000 mg/kg | Freq: Once | INTRAVENOUS | Status: AC
  Administered 2022-10-28: 600 mg via INTRAVENOUS
  Filled 2022-10-28: qty 28.57

## 2022-10-28 MED ORDER — SODIUM CHLORIDE 0.9 % IV SOLN: Freq: Once | INTRAVENOUS | Status: AC

## 2022-10-28 NOTE — Patient Instructions (Signed)

## 2022-10-31 ENCOUNTER — Ambulatory Visit (INDEPENDENT_AMBULATORY_CARE_PROVIDER_SITE_OTHER): Payer: Medicare Other | Admitting: Family Medicine

## 2022-10-31 VITALS — BP 136/86 | HR 83 | Temp 98.4°F | Ht 64.0 in | Wt 230.0 lb

## 2022-10-31 DIAGNOSIS — Z111 Encounter for screening for respiratory tuberculosis: Secondary | ICD-10-CM | POA: Diagnosis not present

## 2022-10-31 DIAGNOSIS — G4700 Insomnia, unspecified: Secondary | ICD-10-CM | POA: Diagnosis not present

## 2022-10-31 DIAGNOSIS — E039 Hypothyroidism, unspecified: Secondary | ICD-10-CM

## 2022-10-31 DIAGNOSIS — I10 Essential (primary) hypertension: Secondary | ICD-10-CM | POA: Diagnosis not present

## 2022-10-31 DIAGNOSIS — Z6841 Body Mass Index (BMI) 40.0 and over, adult: Secondary | ICD-10-CM

## 2022-10-31 DIAGNOSIS — E7849 Other hyperlipidemia: Secondary | ICD-10-CM

## 2022-10-31 MED ORDER — PANTOPRAZOLE SODIUM 40 MG PO TBEC: 40.0000 mg | DELAYED_RELEASE_TABLET | Freq: Every day | ORAL | 5 refills | Status: DC

## 2022-10-31 MED ORDER — PREGABALIN 50 MG PO CAPS: ORAL_CAPSULE | ORAL | 5 refills | Status: DC

## 2022-10-31 MED ORDER — ARMOUR THYROID 15 MG PO TABS: ORAL_TABLET | ORAL | 5 refills | Status: DC

## 2022-10-31 MED ORDER — AMLODIPINE BESYLATE 2.5 MG PO TABS: 2.5000 mg | ORAL_TABLET | Freq: Every day | ORAL | 5 refills | Status: DC

## 2022-10-31 MED ORDER — THYROID 30 MG PO TABS: ORAL_TABLET | ORAL | 5 refills | Status: DC

## 2022-10-31 NOTE — Addendum Note (Signed)
Addended by: Alm Bustard R on: 10/31/2022 01:19 PM   Modules accepted: Orders

## 2022-10-31 NOTE — Progress Notes (Signed)
Subjective:    Patient ID: Karen Vazquez, female    DOB: Sep 29, 1955, 68 y.o.   MRN: [id number]  HPI Form completion for teaching  Follow up HTN, hypothyroidism - recent breast cancer tx Results for orders placed or performed in visit on 10/28/22  T4, free  Result Value Ref Range   Free T4 0.90 0.61 - 1.12 ng/dL  Iron and Iron Binding Capacity ([facility] only)  Result Value Ref Range   Iron 43 28 - 170 ug/dL   TIBC 875 643 - 329 ug/dL   Saturation Ratios 12 10.4 - 31.8 %   UIBC 320 148 - 442 ug/dL  Ferritin  Result Value Ref Range   Ferritin 38 11 - 307 ng/mL  CMP (Cancer Center only)  Result Value Ref Range   Sodium 142 135 - 145 mmol/L   Potassium 3.9 3.5 - 5.1 mmol/L   Chloride 108 98 - 111 mmol/L   CO2 28 22 - 32 mmol/L   Glucose, Bld 90 70 - 99 mg/dL   BUN 10 8 - 23 mg/dL   Creatinine 5.18 (H) 8.41 - 1.00 mg/dL   Calcium 9.0 8.9 - 66.0 mg/dL   Total Protein 6.4 (L) 6.5 - 8.1 g/dL   Albumin 4.1 3.5 - 5.0 g/dL   AST 16 15 - 41 U/L   ALT 11 0 - 44 U/L   Alkaline Phosphatase 107 38 - 126 U/L   Total Bilirubin 0.4 0.3 - 1.2 mg/dL   GFR, Estimated 57 (L) >60 mL/min   Anion gap 6 5 - 15  CBC with Differential (Cancer Center Only)  Result Value Ref Range   WBC Count 6.2 4.0 - 10.5 K/uL   RBC 4.74 3.87 - 5.11 MIL/uL   Hemoglobin 13.3 12.0 - 15.0 g/dL   HCT 63.0 16.0 - 10.9 %   MCV 84.6 80.0 - 100.0 fL   MCH 28.1 26.0 - 34.0 pg   MCHC 33.2 30.0 - 36.0 g/dL   RDW 32.3 55.7 - 32.2 %   Platelet Count 170 150 - 400 K/uL   nRBC 0.0 0.0 - 0.2 %   Neutrophils Relative % 65 %   Neutro Abs 4.0 1.7 - 7.7 K/uL   Lymphocytes Relative 26 %   Lymphs Abs 1.6 0.7 - 4.0 K/uL   Monocytes Relative 7 %   Monocytes Absolute 0.4 0.1 - 1.0 K/uL   Eosinophils Relative 2 %   Eosinophils Absolute 0.1 0.0 - 0.5 K/uL   Basophils Relative 0 %   Basophils Absolute 0.0 0.0 - 0.1 K/uL   Immature Granulocytes 0 %   Abs Immature Granulocytes 0.02 0.00 - 0.07 K/uL  Thyroid Panel With TSH   Result Value Ref Range   TSH 1.540 0.450 - 4.500 uIU/mL   T4, Total 6.5 4.5 - 12.0 ug/dL   T3 Uptake Ratio 28 24 - 39 %   Free Thyroxine Index 1.8 1.2 - 4.9  Lipid panel  Result Value Ref Range   Cholesterol 173 0 - 200 mg/dL   Triglycerides 025 (H) <150 mg/dL   HDL 34 (L) >42 mg/dL   Total CHOL/HDL Ratio 5.1 RATIO   VLDL 53 (H) 0 - 40 mg/dL   LDL Cholesterol 86 0 - 99 mg/dL  Patient relates that she is currently working part-time as a Curator She states that she is trying to be healthy with her eating Prior to become more active Recently went through infusions for breast cancer Lab work reviewed with the  patient in detail  Past Medical History:  Diagnosis Date   Allergy 15 years ago   Dry cough   Anemia    Anxiety    Arthritis    Back pain    Blood transfusion 2011   at [facility]   Bulging lumbar disc    Cancer [facility]) August 28, 2021   Breast cancer   Cataract 2022   Both taken care of in 2022   Chest pain    Diabetes mellitus without complication (HCC)    Dry mouth    Easy bruising    Excessive thirst    Fatigue    Frequent urination    GERD (gastroesophageal reflux disease)    Headache    Heart murmur 20 years ago   Doctor thinking since birth   Heat intolerance    Hypertension    Hypothyroidism    Joint pain    Leg pain    Muscle stiffness    Nervousness    Palpitations    Personal history of chemotherapy    Personal history of radiation therapy    Pre-diabetes    Restless leg syndrome    Shortness of breath on exertion    Sleep apnea    does not use c-pap machine   Stomach ulcer    Stress    Swelling of both lower extremities    Trouble in sleeping    Vitamin D deficiency    Weakness    Outpatient Encounter Medications as of 10/31/2022  Medication Sig   ALPRAZolam (XANAX) 0.5 MG tablet TAKE ONE TABLET BY MOUTH TWO TIMES DAILYAS NEEDED FOR ANXIETY OR SLEEP.   anastrozole (ARIMIDEX) 1 MG tablet Take 1 tablet (1 mg total) by  mouth daily.   diphenoxylate-atropine (LOMOTIL) 2.5-0.025 MG tablet Take 1 tablet by mouth 4 (four) times daily as needed for diarrhea or loose stools.   fluticasone (FLONASE) 50 MCG/ACT nasal spray USE 2 SPRAYS IN EACH NOSTRIL DAILY   lidocaine-prilocaine (EMLA) cream Apply to affected area once   oxybutynin (DITROPAN-XL) 5 MG 24 hr tablet Take by mouth.   [DISCONTINUED] amLODipine (NORVASC) 2.5 MG tablet Take 1 tablet (2.5 mg total) by mouth daily.   [DISCONTINUED] ARMOUR THYROID 15 MG tablet TAKE ONE TABLET ONCE DAILY   [DISCONTINUED] pantoprazole (PROTONIX) 40 MG tablet Take 1 tablet (40 mg total) by mouth daily.   [DISCONTINUED] pregabalin (LYRICA) 50 MG capsule 1 each evening   [DISCONTINUED] thyroid (ARMOUR THYROID) 30 MG tablet TAKE ONE (1) TABLET BY MOUTH EVERY DAY   amLODipine (NORVASC) 2.5 MG tablet Take 1 tablet (2.5 mg total) by mouth daily.   ARMOUR THYROID 15 MG tablet TAKE ONE TABLET ONCE DAILY   pantoprazole (PROTONIX) 40 MG tablet Take 1 tablet (40 mg total) by mouth daily.   Pediatric Vitamins (MULTIVITAMIN GUMMIES CHILDRENS) CHEW FLINSTONES W/ IRON   pregabalin (LYRICA) 50 MG capsule 1 each evening   thyroid (ARMOUR THYROID) 30 MG tablet TAKE ONE (1) TABLET BY MOUTH EVERY DAY   No facility-administered encounter medications on file as of 10/31/2022.    Review of Systems     Objective:   Physical Exam   General-in no acute distress Eyes-no discharge Lungs-respiratory rate normal, CTA CV-no murmurs,RRR Extremities skin warm dry no edema Neuro grossly normal Behavior normal, alert      Assessment & Plan:  1. Essential hypertension HTN- patient seen for follow-up regarding HTN.   Diet, medication compliance, appropriate labs and refills were completed.   Importance of  keeping blood pressure under good control to lessen the risk of complications discussed Regular follow-up visits discussed   2. Other hyperlipidemia labs reviewed with patient good control  triglycerides mildly up healthy diet best approach 3. Insomnia, unspecified type Patient to continue current medication  4. Obesity, current with BMI of 38.4 Portion control regular physical activity  5. Hypothyroidism, unspecified type Thyroid medicine stable on continue current measures lab work looks good  Encourage patient to get up-to-date pneumonia vaccine as well as RSV and flu vaccine she will discuss with hematology oncology  Slight elevation in creatinine will follow closely

## 2022-11-02 ENCOUNTER — Ambulatory Visit: Payer: Medicare Other

## 2022-11-14 ENCOUNTER — Ambulatory Visit: Payer: Medicare Other | Attending: Surgery

## 2022-11-14 ENCOUNTER — Telehealth: Payer: Self-pay | Admitting: Hematology and Oncology

## 2022-11-14 VITALS — Wt 230.5 lb

## 2022-11-14 DIAGNOSIS — Z483 Aftercare following surgery for neoplasm: Secondary | ICD-10-CM | POA: Insufficient documentation

## 2022-11-14 NOTE — Telephone Encounter (Signed)
Scheduled and canceled appointments per provider PAL. Patient is aware of the changes made to her upcoming appointments.

## 2022-11-14 NOTE — Therapy (Signed)
OUTPATIENT PHYSICAL THERAPY SOZO SCREENING NOTE   Patient Name: Karen Vazquez MRN: [id number] DOB:Jul 20, 1955, 67 y.o., female Today's Date: 11/14/2022  PCP: Babs Sciara, MD REFERRING PROVIDER: Harriette Bouillon, MD   PT End of Session - 11/14/22 [phone]     Visit Number 2   # unchanged due to screen only   PT Start Time 0839    PT Stop Time 0844    PT Time Calculation (min) 5 min    Activity Tolerance Patient tolerated treatment well    Behavior During Therapy [facility] for tasks assessed/performed             Past Medical History:  Diagnosis Date   Allergy 15 years ago   Dry cough   Anemia    Anxiety    Arthritis    Back pain    Blood transfusion 2011   at [facility]   Bulging lumbar disc    Cancer [facility]) August 28, 2021   Breast cancer   Cataract 2022   Both taken care of in 2022   Chest pain    Diabetes mellitus without complication (HCC)    Dry mouth    Easy bruising    Excessive thirst    Fatigue    Frequent urination    GERD (gastroesophageal reflux disease)    Headache    Heart murmur 20 years ago   Doctor thinking since birth   Heat intolerance    Hypertension    Hypothyroidism    Joint pain    Leg pain    Muscle stiffness    Nervousness    Palpitations    Personal history of chemotherapy    Personal history of radiation therapy    Pre-diabetes    Restless leg syndrome    Shortness of breath on exertion    Sleep apnea    does not use c-pap machine   Stomach ulcer    Stress    Swelling of both lower extremities    Trouble in sleeping    Vitamin D deficiency    Weakness    Past Surgical History:  Procedure Laterality Date   ABDOMINAL HYSTERECTOMY  25 years ago   Partial hysterectomy. Still have ovaries   APPENDECTOMY     BIOPSY  05/16/2019   Procedure: BIOPSY;  Surgeon: Malissa Hippo, MD;  [facility];  Service: Endoscopy;;  duodenum antral   BLADDER SUSPENSION  12/29/2010   Procedure: TRANSVAGINAL TAPE (TVT) PROCEDURE;  Surgeon:  Loney Laurence;  Location: [facility];  Service: Gynecology;  Laterality: N/A;   BREAST LUMPECTOMY Left 10/26/2021   BREAST LUMPECTOMY WITH RADIOACTIVE SEED AND SENTINEL LYMPH NODE BIOPSY Left 10/26/2021   Procedure: LEFT BREAST SEED LUMPECTOMY WITH LEFT SENTINEL LYMPH NODE MAPPING;  Surgeon: Harriette Bouillon, MD;  Location: [facility];  Service: General;  Laterality: Left;   BREAST SURGERY     left breast lumpectomy 1977   CATARACT EXTRACTION     COLONOSCOPY N/A 03/03/2016   Procedure: COLONOSCOPY;  Surgeon: Malissa Hippo, MD;  [facility];  Service: Endoscopy;  Laterality: N/A;  2:00 - moved to 12/14 @ 12:55 - Ann notified pt   CYSTOCELE REPAIR  12/29/2010   Procedure: ANTERIOR REPAIR (CYSTOCELE);  Surgeon: Loney Laurence;  Location: [facility];  Service: Gynecology;  Laterality: N/A;   CYSTOSCOPY  12/29/2010   Procedure: CYSTOSCOPY;  Surgeon: Loney Laurence;  Location: [facility];  Service: Gynecology;  Laterality: N/A;   ESOPHAGOGASTRODUODENOSCOPY  N/A 05/16/2019   Procedure: ESOPHAGOGASTRODUODENOSCOPY (EGD);  Surgeon: Malissa Hippo, MD;  [facility];  Service: Endoscopy;  Laterality: N/A;   EYE SURGERY  2023   Cataracts   FRACTURE SURGERY  30 years ago   Broken ankle abs then broken left wrist in 2021   JOINT REPLACEMENT  2011   rt knee   PORTACATH PLACEMENT N/A 10/26/2021   Procedure: PORT PLACEMENT WITH ULTRASOUND GUIDANCE;  Surgeon: Harriette Bouillon, MD;  Location: [facility];  Service: General;  Laterality: N/A;   REPLACEMENT TOTAL KNEE Right 2011   TONSILLECTOMY     TOTAL KNEE ARTHROPLASTY Left 11/17/2014   Procedure: LEFT TOTAL KNEE ARTHROPLASTY;  Surgeon: Ollen Gross, MD;  Location: [facility];  Service: Orthopedics;  Laterality: Left;   TUBAL LIGATION     VAGINAL HYSTERECTOMY  12/29/2010   Procedure: HYSTERECTOMY VAGINAL;  Surgeon: Loney Laurence;  Location: [facility];  Service: Gynecology;  Laterality: N/A;   Patient  Active Problem List   Diagnosis Date Noted   Port-A-Cath in place 11/25/2021   Genetic testing 09/20/2021   Family history of prostate cancer in father 09/15/2021   Family history of breast cancer 09/15/2021   Malignant neoplasm of upper-outer quadrant of left breast in female, estrogen receptor positive (HCC) 09/09/2021   Menopausal symptom 08/10/2021   UTI (urinary tract infection) 08/10/2021   Eating disorder 04/01/2021   Lesion of vulva 12/22/2020   History of iron deficiency anemia 10/27/2020   Cataract, nuclear sclerotic, both eyes 01/10/2020   Duodenal ulcer 06/24/2019   Fracture of distal end of radius 01/22/2018   History of total knee replacement, right 06/08/2017   History of colonic polyps 04/26/2016   OA (osteoarthritis) of knee 11/17/2014   Essential hypertension 05/28/2014   Obstructive sleep apnea 05/28/2014   Anemia, iron deficiency 12/16/2013   Prediabetes 05/01/2013   GERD (gastroesophageal reflux disease) 02/06/2013   Hypothyroidism 12/12/2012   Insomnia 12/12/2012   Hyperlipidemia 12/12/2012   Osteoarthritis 12/12/2012   Restless legs syndrome 12/12/2012   S/P LASIK surgery of both eyes 03/21/1998    REFERRING DIAG: left breast cancer at risk for lymphedema  THERAPY DIAG: Aftercare following surgery for neoplasm  PERTINENT HISTORY: Patient was diagnosed on 08/17/2021 with left grade 2 invasive ductal carcinoma breast cancer. It is ER positive, PR negative, and HER2 positive with a Ki67 of 55%. She underwent a left lumpectomy and sentinel node biopsy (3 negative nodes) on 10/26/2021. She had a benign left breast lump removed in 1977, has anemia, hypertension, and has had both knee replaced in 2011 and 2016 (unknown when right versus left were done).   PRECAUTIONS: left UE Lymphedema risk,   SUBJECTIVE: Pt is here for 3 month SOZO screening.  PAIN:  Are you having pain? No  SOZO SCREENING: Patient was assessed today using the SOZO machine to determine the  lymphedema index score. This was compared to her baseline score. It was determined that she is within the recommended range when compared to her baseline and no further action is needed at this time. She will continue SOZO screenings. These are done every 3 months for 2 years post operatively followed by every 6 months for 2 years, and then annually.   L-DEX FLOWSHEETS - 11/14/22 0800       L-DEX LYMPHEDEMA SCREENING   Measurement Type Unilateral    L-DEX MEASUREMENT EXTREMITY Upper Extremity    POSITION  Standing    DOMINANT SIDE Right  At Risk Side Left    BASELINE SCORE (UNILATERAL) 2.2    L-DEX SCORE (UNILATERAL) 5.9    VALUE CHANGE (UNILAT) 3.7                Hermenia Bers, PTA 11/14/2022, 8:44 AM

## 2022-11-17 ENCOUNTER — Ambulatory Visit: Payer: Medicare Other

## 2022-11-17 ENCOUNTER — Other Ambulatory Visit: Payer: Medicare Other

## 2022-11-17 ENCOUNTER — Telehealth: Payer: Self-pay | Admitting: Hematology and Oncology

## 2022-11-17 ENCOUNTER — Encounter: Payer: Self-pay | Admitting: *Deleted

## 2022-11-17 DIAGNOSIS — Z170 Estrogen receptor positive status [ER+]: Secondary | ICD-10-CM

## 2022-11-17 NOTE — Telephone Encounter (Signed)
Rescheduled appointments per room/resource, per Erin RN per 8/29 secure chat. Patent is aware of the changes made to her upcoming appointments.

## 2022-11-18 ENCOUNTER — Inpatient Hospital Stay: Payer: Medicare Other | Admitting: Hematology and Oncology

## 2022-11-18 ENCOUNTER — Inpatient Hospital Stay: Payer: Medicare Other

## 2022-11-18 ENCOUNTER — Other Ambulatory Visit: Payer: Self-pay | Admitting: *Deleted

## 2022-11-18 ENCOUNTER — Other Ambulatory Visit: Payer: Self-pay

## 2022-11-18 VITALS — BP 133/73 | HR 71 | Temp 98.3°F

## 2022-11-18 DIAGNOSIS — Z95828 Presence of other vascular implants and grafts: Secondary | ICD-10-CM

## 2022-11-18 DIAGNOSIS — C50412 Malignant neoplasm of upper-outer quadrant of left female breast: Secondary | ICD-10-CM

## 2022-11-18 DIAGNOSIS — Z5111 Encounter for antineoplastic chemotherapy: Secondary | ICD-10-CM | POA: Diagnosis not present

## 2022-11-18 LAB — FERRITIN: Ferritin: 34 ng/mL (ref 11–307)

## 2022-11-18 LAB — CMP (CANCER CENTER ONLY)
ALT: 15 U/L (ref 0–44)
AST: 21 U/L (ref 15–41)
Albumin: 3.9 g/dL (ref 3.5–5.0)
Alkaline Phosphatase: 105 U/L (ref 38–126)
Anion gap: 7 (ref 5–15)
BUN: 16 mg/dL (ref 8–23)
CO2: 27 mmol/L (ref 22–32)
Calcium: 9.3 mg/dL (ref 8.9–10.3)
Chloride: 106 mmol/L (ref 98–111)
Creatinine: 0.97 mg/dL (ref 0.44–1.00)
GFR, Estimated: >60 mL/min (ref >60)
Glucose, Bld: 132 mg/dL — ABNORMAL HIGH (ref 70–99)
Potassium: 3.6 mmol/L (ref 3.5–5.1)
Sodium: 140 mmol/L (ref 135–145)
Total Bilirubin: 0.8 mg/dL (ref 0.3–1.2)
Total Protein: 6.6 g/dL (ref 6.5–8.1)

## 2022-11-18 LAB — CBC WITH DIFFERENTIAL (CANCER CENTER ONLY)
Abs Immature Granulocytes: 0.01 10*3/uL (ref 0.00–0.07)
Basophils Absolute: 0 10*3/uL (ref 0.0–0.1)
Basophils Relative: 0 %
Eosinophils Absolute: 0.1 10*3/uL (ref 0.0–0.5)
Eosinophils Relative: 2 %
HCT: 38.5 % (ref 36.0–46.0)
Hemoglobin: 12.9 g/dL (ref 12.0–15.0)
Immature Granulocytes: 0 %
Lymphocytes Relative: 32 %
Lymphs Abs: 1.8 10*3/uL (ref 0.7–4.0)
MCH: 28.5 pg (ref 26.0–34.0)
MCHC: 33.5 g/dL (ref 30.0–36.0)
MCV: 85.2 fL (ref 80.0–100.0)
Monocytes Absolute: 0.4 10*3/uL (ref 0.1–1.0)
Monocytes Relative: 6 %
Neutro Abs: 3.4 10*3/uL (ref 1.7–7.7)
Neutrophils Relative %: 60 %
Platelet Count: 154 10*3/uL (ref 150–400)
RBC: 4.52 MIL/uL (ref 3.87–5.11)
RDW: 13.6 % (ref 11.5–15.5)
WBC Count: 5.7 10*3/uL (ref 4.0–10.5)
nRBC: 0 % (ref 0.0–0.2)

## 2022-11-18 LAB — IRON AND IRON BINDING CAPACITY (CC-WL,HP ONLY)
Iron: 85 ug/dL (ref 28–170)
Saturation Ratios: 22 % (ref 10.4–31.8)
TIBC: 393 ug/dL (ref 250–450)
UIBC: 308 ug/dL

## 2022-11-18 MED ORDER — ACETAMINOPHEN 325 MG PO TABS
650.0000 mg | ORAL_TABLET | Freq: Once | ORAL | Status: AC
  Administered 2022-11-18: 650 mg via ORAL
  Filled 2022-11-18: qty 2

## 2022-11-18 MED ORDER — SODIUM CHLORIDE 0.9 % IV SOLN: Freq: Once | INTRAVENOUS | Status: AC

## 2022-11-18 MED ORDER — SODIUM CHLORIDE 0.9% FLUSH
10.0000 mL | INTRAVENOUS | Status: DC | PRN
  Administered 2022-11-18: 10 mL

## 2022-11-18 MED ORDER — DIPHENHYDRAMINE HCL 25 MG PO CAPS
50.0000 mg | ORAL_CAPSULE | Freq: Once | ORAL | Status: AC
  Administered 2022-11-18: 50 mg via ORAL
  Filled 2022-11-18: qty 2

## 2022-11-18 MED ORDER — HEPARIN SOD (PORK) LOCK FLUSH 100 UNIT/ML IV SOLN
500.0000 [IU] | Freq: Once | INTRAVENOUS | Status: AC | PRN
  Administered 2022-11-18: 500 [IU]

## 2022-11-18 MED ORDER — TRASTUZUMAB-DTTB CHEMO 150 MG IV SOLR
6.0000 mg/kg | Freq: Once | INTRAVENOUS | Status: AC
  Administered 2022-11-18: 600 mg via INTRAVENOUS
  Filled 2022-11-18: qty 28.57

## 2022-11-18 MED ORDER — SODIUM CHLORIDE 0.9% FLUSH
10.0000 mL | Freq: Once | INTRAVENOUS | Status: AC
  Administered 2022-11-18: 10 mL

## 2022-11-18 NOTE — Patient Instructions (Signed)
[facility]  Discharge Instructions: Thank you for choosing [facility] to provide your oncology and hematology care.   If you have a lab appointment with the Cancer Center, please go directly to the Cancer Center and check in at the registration area.   Wear comfortable clothing and clothing appropriate for easy access to any Portacath or PICC line.   We strive to give you quality time with your provider. You may need to reschedule your appointment if you arrive late (15 or more minutes).  Arriving late affects you and other patients whose appointments are after yours.  Also, if you miss three or more appointments without notifying the office, you may be dismissed from the clinic at the provider's discretion.      For prescription refill requests, have your pharmacy contact our office and allow 72 hours for refills to be completed.    Today you received the following chemotherapy and/or immunotherapy agents: [id number]      To help prevent nausea and vomiting after your treatment, we encourage you to take your nausea medication as directed.  BELOW ARE SYMPTOMS THAT SHOULD BE REPORTED IMMEDIATELY: *FEVER GREATER THAN 100.4 F (38 C) OR HIGHER *CHILLS OR SWEATING *NAUSEA AND VOMITING THAT IS NOT CONTROLLED WITH YOUR NAUSEA MEDICATION *UNUSUAL SHORTNESS OF BREATH *UNUSUAL BRUISING OR BLEEDING *URINARY PROBLEMS (pain or burning when urinating, or frequent urination) *BOWEL PROBLEMS (unusual diarrhea, constipation, pain near the anus) TENDERNESS IN MOUTH AND THROAT WITH OR WITHOUT PRESENCE OF ULCERS (sore throat, sores in mouth, or a toothache) UNUSUAL RASH, SWELLING OR PAIN  UNUSUAL VAGINAL DISCHARGE OR ITCHING   Items with * indicate a potential emergency and should be followed up as soon as possible or go to the Emergency Department if any problems should occur.  Please show the CHEMOTHERAPY ALERT CARD or IMMUNOTHERAPY ALERT CARD  at check-in to the Emergency Department and triage nurse.  Should you have questions after your visit or need to cancel or reschedule your appointment, please contact [facility]  Dept: [phone]  and follow the prompts.  Office hours are 8:00 a.m. to 4:30 p.m. Monday - Friday. Please note that voicemails left after 4:00 p.m. may not be returned until the following business day.  We are closed weekends and major holidays. You have access to a nurse at all times for urgent questions. Please call the main number to the clinic Dept: [phone] and follow the prompts.   For any non-urgent questions, you may also contact your provider using MyChart. We now offer e-Visits for anyone 17 and older to request care online for non-urgent symptoms. For details visit [email or web address].   Also download the MyChart app! Go to the app store, search "MyChart", open the app, select [facility], and log in with your MyChart username and password.

## 2022-11-22 ENCOUNTER — Encounter: Payer: Self-pay | Admitting: Hematology and Oncology

## 2022-11-22 NOTE — Telephone Encounter (Signed)
Attempted to call pt regarding signatera results lvm for pt that signatera results were negative and they will be back out to repeat labs.

## 2022-11-23 ENCOUNTER — Encounter (HOSPITAL_COMMUNITY): Payer: Self-pay

## 2022-12-14 ENCOUNTER — Other Ambulatory Visit: Payer: Self-pay | Admitting: Hematology and Oncology

## 2022-12-14 ENCOUNTER — Encounter: Payer: Self-pay | Admitting: Hematology and Oncology

## 2022-12-14 DIAGNOSIS — Z170 Estrogen receptor positive status [ER+]: Secondary | ICD-10-CM

## 2022-12-14 MED ORDER — DIPHENOXYLATE-ATROPINE 2.5-0.025 MG PO TABS: 1.0000 | ORAL_TABLET | Freq: Four times a day (QID) | ORAL | 3 refills | Status: DC | PRN

## 2023-01-05 ENCOUNTER — Inpatient Hospital Stay: Payer: Medicare Other | Attending: Hematology and Oncology | Admitting: Adult Health

## 2023-01-05 VITALS — BP 125/59 | HR 96 | Temp 97.9°F | Resp 18 | Ht 64.0 in | Wt 222.5 lb

## 2023-01-05 DIAGNOSIS — Z923 Personal history of irradiation: Secondary | ICD-10-CM | POA: Insufficient documentation

## 2023-01-05 DIAGNOSIS — I10 Essential (primary) hypertension: Secondary | ICD-10-CM | POA: Insufficient documentation

## 2023-01-05 DIAGNOSIS — K219 Gastro-esophageal reflux disease without esophagitis: Secondary | ICD-10-CM | POA: Insufficient documentation

## 2023-01-05 DIAGNOSIS — Z803 Family history of malignant neoplasm of breast: Secondary | ICD-10-CM | POA: Insufficient documentation

## 2023-01-05 DIAGNOSIS — Z9221 Personal history of antineoplastic chemotherapy: Secondary | ICD-10-CM | POA: Insufficient documentation

## 2023-01-05 DIAGNOSIS — C50412 Malignant neoplasm of upper-outer quadrant of left female breast: Secondary | ICD-10-CM | POA: Diagnosis present

## 2023-01-05 DIAGNOSIS — Z170 Estrogen receptor positive status [ER+]: Secondary | ICD-10-CM | POA: Diagnosis not present

## 2023-01-05 DIAGNOSIS — Z8042 Family history of malignant neoplasm of prostate: Secondary | ICD-10-CM | POA: Diagnosis not present

## 2023-01-05 DIAGNOSIS — G4730 Sleep apnea, unspecified: Secondary | ICD-10-CM | POA: Diagnosis not present

## 2023-01-05 DIAGNOSIS — D509 Iron deficiency anemia, unspecified: Secondary | ICD-10-CM | POA: Diagnosis not present

## 2023-01-05 DIAGNOSIS — G2581 Restless legs syndrome: Secondary | ICD-10-CM | POA: Insufficient documentation

## 2023-01-05 DIAGNOSIS — Z860100 Personal history of colon polyps, unspecified: Secondary | ICD-10-CM | POA: Insufficient documentation

## 2023-01-05 DIAGNOSIS — E1136 Type 2 diabetes mellitus with diabetic cataract: Secondary | ICD-10-CM | POA: Diagnosis not present

## 2023-01-05 DIAGNOSIS — Z79811 Long term (current) use of aromatase inhibitors: Secondary | ICD-10-CM | POA: Diagnosis not present

## 2023-01-05 DIAGNOSIS — E039 Hypothyroidism, unspecified: Secondary | ICD-10-CM | POA: Insufficient documentation

## 2023-01-06 ENCOUNTER — Telehealth: Payer: Self-pay | Admitting: Hematology and Oncology

## 2023-01-06 NOTE — Telephone Encounter (Signed)
Spoke with patient confirming upcoming appointment  

## 2023-01-08 ENCOUNTER — Encounter: Payer: Self-pay | Admitting: Hematology and Oncology

## 2023-01-08 NOTE — Assessment & Plan Note (Addendum)
09/07/2021:Screening mammogram detected left breast mass by ultrasound 2 o'clock position measured 1.4 cm axilla was negative.  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 adjuvant chemotherapy with Taxol Herceptin weekly x12 followed by Herceptin maintenance every 3 weeks for 1 year 2.  Adjuvant radiation therapy until 04/13/2022 3.  Adjuvant antiestrogen therapy ----------------------------------------------------------------------------------------------------------------------------- Current treatment: Herceptin maintenance therapy (until September 2024) Toxicities: Intermittent diarrhea Echocardiogram  08/05/2022: EF 60 to 65%  Anastrozole toxicities: Vaginal dryness.  Recommended Vitamin E Suppositories and Coconut oil as lubrication Bone Health: bone density testing scheduled 04/2023.  Reviewed calcium, vitamin d and weight bearing exercise.    Breast cancer surveillance: Breast exam 01/08/2023: Benign Mammogram 09/26/2022: Benign density category B  Recommended healthy diet and exercise.     RTC 07/2023 for f/u with Dr. Pamelia Hoit.

## 2023-01-08 NOTE — Progress Notes (Signed)
[facility] Cancer Follow up:    Babs Sciara, MD [address] [place] [address]   DIAGNOSIS:  Cancer Staging  Malignant neoplasm of upper-outer quadrant of left breast in female, estrogen receptor positive (HCC) Staging form: Breast, AJCC 8th Edition - Clinical: Stage IA (cT1b, cN0, cM0, G3, ER-, PR-, HER2+) - Signed by Lonie Peak, MD on 03/22/2022 Stage prefix: Initial diagnosis Histologic grading system: 3 grade system - Pathologic stage from 10/26/2021: Stage IA (pT1c, pN0, cM0, G3, ER+, PR-, HER2+) - Signed by Loa Socks, NP on 05/12/2022 Stage prefix: Initial diagnosis Histologic grading system: 3 grade system   SUMMARY OF ONCOLOGIC HISTORY: Oncology History  Malignant neoplasm of upper-outer quadrant of left breast in female, estrogen receptor positive (HCC)  09/07/2021 Initial Diagnosis   Screening mammogram detected left breast mass by ultrasound 2 o'clock position measured 1.4 cm axilla was negative.  Ultrasound-guided biopsy revealed grade 2 IDC with focal necrosis ER 90% weak, PR 0%, Ki-67 55%, HER2 3+ positive   09/15/2021 Cancer Staging   Staging form: Breast, AJCC 8th Edition - Clinical: Stage IA (cT1b, cN0, cM0, G3, ER-, PR-, HER2+) - Signed by Lonie Peak, MD on 03/22/2022 Stage prefix: Initial diagnosis Histologic grading system: 3 grade system    Genetic Testing   [facility] CustomNext Panel was Negative. Report date is 10/04/2021.  The CustomNext-Cancer+RNAinsight panel offered by [employer] includes sequencing and rearrangement analysis for the following 47 genes:  APC, ATM, AXIN2, BARD1, BMPR1A, BRCA1, BRCA2, BRIP1, CDH1, CDK4, CDKN2A, CHEK2, CTNNA1, DICER1, EPCAM, GREM1, HOXB13, KIT, MEN1, MLH1, MSH2, MSH3, MSH6, MUTYH, NBN, NF1, NTHL1, PALB2, PDGFRA, PMS2, POLD1, POLE, PTEN, RAD50, RAD51C, RAD51D, SDHA, SDHB, SDHC, SDHD, SMAD4, SMARCA4, STK11, TP53, TSC1, TSC2, and VHL.  RNA data is routinely analyzed for use in variant  interpretation for all genes.   10/26/2021 Surgery   Left lumpectomy: Grade 3 IDC 1.9 cm with high-grade DCIS, margins negative, 0/3 lymph nodes negative, ER 90%, PR 0%, HER2 3+ positive, Ki-67 55%    10/26/2021 Cancer Staging   Staging form: Breast, AJCC 8th Edition - Pathologic stage from 10/26/2021: Stage IA (pT1c, pN0, cM0, G3, ER+, PR-, HER2+) - Signed by Loa Socks, NP on 05/12/2022 Stage prefix: Initial diagnosis Histologic grading system: 3 grade system   11/25/2021 -  Chemotherapy   Patient is on Treatment Plan : BREAST Paclitaxel + Trastuzumab q7d / Trastuzumab q21d     03/16/2022 - 04/13/2022 Radiation Therapy   40.05 Gy in 15 treatments "Boost": 10 Gy in 5 treatments   04/2022 -  Anti-estrogen oral therapy   Anastrozole x 7 years     CURRENT THERAPY: Anastrozole   INTERVAL HISTORY: Karen Vazquez 67 y.o. female returns for f/u of her breast cancer.  She has been taking Anastrozole daily and is tolerating it moderately well.  She has some vaginal dryness.  She was recommended to use replens which she has not yet picked up.  Her most recent mammogram occurred on 09/26/2022 and demonstrated no mammographic evidence of malignancy and breast density category B.     Patient Active Problem List   Diagnosis Date Noted   Port-A-Cath in place 11/25/2021   Genetic testing 09/20/2021   Family history of prostate cancer in father 09/15/2021   Family history of breast cancer 09/15/2021   Malignant neoplasm of upper-outer quadrant of left breast in female, estrogen receptor positive (HCC) 09/09/2021   Menopausal symptom 08/10/2021   UTI (urinary tract infection)  08/10/2021   Eating disorder 04/01/2021   Lesion of vulva 12/22/2020   History of iron deficiency anemia 10/27/2020   Cataract, nuclear sclerotic, both eyes 01/10/2020   Duodenal ulcer 06/24/2019   Fracture of distal end of radius 01/22/2018   History of total knee replacement, right 06/08/2017   History of colonic  polyps 04/26/2016   OA (osteoarthritis) of knee 11/17/2014   Essential hypertension 05/28/2014   Obstructive sleep apnea 05/28/2014   Anemia, iron deficiency 12/16/2013   Prediabetes 05/01/2013   GERD (gastroesophageal reflux disease) 02/06/2013   Hypothyroidism 12/12/2012   Insomnia 12/12/2012   Hyperlipidemia 12/12/2012   Osteoarthritis 12/12/2012   Restless legs syndrome 12/12/2012   S/P LASIK surgery of both eyes 03/21/1998    is allergic to lisinopril.  MEDICAL HISTORY: Past Medical History:  Diagnosis Date   Allergy 15 years ago   Dry cough   Anemia    Anxiety    Arthritis    Back pain    Blood transfusion 2011   at [facility]   Bulging lumbar disc    Cancer [facility]) August 28, 2021   Breast cancer   Cataract 2022   Both taken care of in 2022   Chest pain    Diabetes mellitus without complication (HCC)    Dry mouth    Easy bruising    Excessive thirst    Fatigue    Frequent urination    GERD (gastroesophageal reflux disease)    Headache    Heart murmur 20 years ago   Doctor thinking since birth   Heat intolerance    Hypertension    Hypothyroidism    Joint pain    Leg pain    Muscle stiffness    Nervousness    Palpitations    Personal history of chemotherapy    Personal history of radiation therapy    Pre-diabetes    Restless leg syndrome    Shortness of breath on exertion    Sleep apnea    does not use c-pap machine   Stomach ulcer    Stress    Swelling of both lower extremities    Trouble in sleeping    Vitamin D deficiency    Weakness     SURGICAL HISTORY: Past Surgical History:  Procedure Laterality Date   ABDOMINAL HYSTERECTOMY  25 years ago   Partial hysterectomy. Still have ovaries   APPENDECTOMY     BIOPSY  05/16/2019   Procedure: BIOPSY;  Surgeon: Malissa Hippo, MD;  [facility];  Service: Endoscopy;;  duodenum antral   BLADDER SUSPENSION  12/29/2010   Procedure: TRANSVAGINAL TAPE (TVT) PROCEDURE;  Surgeon: Loney Laurence;  [facility]: [facility];  Service: Gynecology;  Laterality: N/A;   BREAST LUMPECTOMY Left 10/26/2021   BREAST LUMPECTOMY WITH RADIOACTIVE SEED AND SENTINEL LYMPH NODE BIOPSY Left 10/26/2021   Procedure: LEFT BREAST SEED LUMPECTOMY WITH LEFT SENTINEL LYMPH NODE MAPPING;  Surgeon: Harriette Bouillon, MD;  Location: [facility];  Service: General;  Laterality: Left;   BREAST SURGERY     left breast lumpectomy 1977   CATARACT EXTRACTION     COLONOSCOPY N/A 03/03/2016   Procedure: COLONOSCOPY;  Surgeon: Malissa Hippo, MD;  [facility];  Service: Endoscopy;  Laterality: N/A;  2:00 - moved to 12/14 @ 12:55 - Ann notified pt   CYSTOCELE REPAIR  12/29/2010   Procedure: ANTERIOR REPAIR (CYSTOCELE);  Surgeon: Loney Laurence;  [facility]: [facility];  Service: Gynecology;  Laterality:  N/A;   CYSTOSCOPY  12/29/2010   Procedure: CYSTOSCOPY;  Surgeon: Loney Laurence;  [facility];  Service: Gynecology;  Laterality: N/A;   ESOPHAGOGASTRODUODENOSCOPY N/A 05/16/2019   Procedure: ESOPHAGOGASTRODUODENOSCOPY (EGD);  Surgeon: Malissa Hippo, MD;  [facility];  Service: Endoscopy;  Laterality: N/A;   EYE SURGERY  2023   Cataracts   FRACTURE SURGERY  30 years ago   Broken ankle abs then broken left wrist in 2021   JOINT REPLACEMENT  2011   rt knee   PORTACATH PLACEMENT N/A 10/26/2021   Procedure: PORT PLACEMENT WITH ULTRASOUND GUIDANCE;  Surgeon: Harriette Bouillon, MD;  Location: [facility];  Service: General;  Laterality: N/A;   REPLACEMENT TOTAL KNEE Right 2011   TONSILLECTOMY     TOTAL KNEE ARTHROPLASTY Left 11/17/2014   Procedure: LEFT TOTAL KNEE ARTHROPLASTY;  Surgeon: Ollen Gross, MD;  Location: [facility];  Service: Orthopedics;  Laterality: Left;   TUBAL LIGATION     VAGINAL HYSTERECTOMY  12/29/2010   Procedure: HYSTERECTOMY VAGINAL;  Surgeon: Loney Laurence;  [facility];  Service: Gynecology;  Laterality: N/A;    SOCIAL  HISTORY: Social History   Socioeconomic History   Marital status: Married    Spouse name: Windy Fast   Number of children: Not on file   Years of education: Not on file   Highest education level: Some college, no degree  Occupational History   Occupation: Retired  Tobacco Use   Smoking status: Never   Smokeless tobacco: Never  Vaping Use   Vaping status: Never Used  Substance and Sexual Activity   Alcohol use: No   Drug use: No   Sexual activity: Not Currently    Birth control/protection: Post-menopausal    Comment: HYST  Other Topics Concern   Not on file  Social History Narrative   Not on file   Social Determinants of Health   Financial Resource Strain: Low Risk  (10/27/2022)   Overall Financial Resource Strain (CARDIA)    Difficulty of Paying Living Expenses: Not very hard  Food Insecurity: No Food Insecurity (10/27/2022)   Hunger Vital Sign    Worried About Running Out of Food in the Last Year: Never true    Ran Out of Food in the Last Year: Never true  Transportation Needs: No Transportation Needs (10/27/2022)   PRAPARE - Administrator, Civil Service (Medical): No    Lack of Transportation (Non-Medical): No  Physical Activity: Insufficiently Active (10/27/2022)   Exercise Vital Sign    Days of Exercise per Week: 2 days    Minutes of Exercise per Session: 20 min  Stress: Stress Concern Present (10/27/2022)   [employer] of Occupational Health - Occupational Stress Questionnaire    Feeling of Stress : To some extent  Social Connections: Socially Integrated (10/27/2022)   Social Connection and Isolation Panel [NHANES]    Frequency of Communication with Friends and Family: More than three times a week    Frequency of Social Gatherings with Friends and Family: Three times a week    Attends Religious Services: More than 4 times per year    Active Member of Clubs or Organizations: Yes    Attends Banker Meetings: More than 4 times per year    Marital  Status: Married  Catering manager Violence: Not At Risk (08/19/2022)   Humiliation, Afraid, Rape, and Kick questionnaire    Fear of Current or Ex-Partner: No    Emotionally Abused: No  Physically Abused: No    Sexually Abused: No    FAMILY HISTORY: Family History  Problem Relation Age of Onset   Stroke Mother    Hypertension Mother    Hyperlipidemia Mother    Thyroid disease Mother    Arthritis Mother    Heart disease Father    COPD Father    Depression Father    Prostate cancer Father 25 - 84   Cancer Father    Breast cancer Paternal Grandmother 26 - 83   Cancer Paternal Grandmother    Diabetes Paternal Grandfather    Colon cancer Cousin    Depression Brother    Depression Brother     Review of Systems  Constitutional:  Negative for appetite change, chills, fatigue, fever and unexpected weight change.  HENT:   Negative for hearing loss, lump/mass and trouble swallowing.   Eyes:  Negative for eye problems and icterus.  Respiratory:  Negative for chest tightness, cough and shortness of breath.   Cardiovascular:  Negative for chest pain, leg swelling and palpitations.  Gastrointestinal:  Negative for abdominal distention, abdominal pain, constipation, diarrhea, nausea and vomiting.  Endocrine: Negative for hot flashes.  Genitourinary:  Negative for difficulty urinating.   Musculoskeletal:  Negative for arthralgias.  Skin:  Negative for itching and rash.  Neurological:  Negative for dizziness, extremity weakness, headaches and numbness.  Hematological:  Negative for adenopathy. Does not bruise/bleed easily.  Psychiatric/Behavioral:  Negative for depression. The patient is not nervous/anxious.       PHYSICAL EXAMINATION    Vitals:   01/05/23 1349  BP: (!) 125/59  Pulse: 96  Resp: 18  Temp: 97.9 F (36.6 C)  SpO2: 99%    Physical Exam Constitutional:      General: She is not in acute distress.    Appearance: Normal appearance. She is not toxic-appearing.   HENT:     Head: Normocephalic and atraumatic.     Mouth/Throat:     Mouth: Mucous membranes are moist.     Pharynx: Oropharynx is clear. No oropharyngeal exudate or posterior oropharyngeal erythema.  Eyes:     General: No scleral icterus. Cardiovascular:     Rate and Rhythm: Normal rate and regular rhythm.     Pulses: Normal pulses.     Heart sounds: Normal heart sounds.  Pulmonary:     Effort: Pulmonary effort is normal.     Breath sounds: Normal breath sounds.  Chest:     Comments: Left breast s/p lumpectomy and radiation, no sign of local recurrence, right breast benign.   Abdominal:     General: Abdomen is flat. Bowel sounds are normal. There is no distension.     Palpations: Abdomen is soft.     Tenderness: There is no abdominal tenderness.  Musculoskeletal:        General: No swelling.     Cervical back: Neck supple.  Lymphadenopathy:     Cervical: No cervical adenopathy.  Skin:    General: Skin is warm and dry.     Findings: No rash.  Neurological:     General: No focal deficit present.     Mental Status: She is alert.  Psychiatric:        Mood and Affect: Mood normal.        Behavior: Behavior normal.     LABORATORY DATA:  CBC    Component Value Date/Time   WBC 5.7 11/18/2022 1236   WBC 5.7 05/16/2019 0535   RBC 4.52 11/18/2022 1236  HGB 12.9 11/18/2022 1236   HGB 14.2 02/24/2021 1018   HCT 38.5 11/18/2022 1236   HCT 43.0 02/24/2021 1018   PLT 154 11/18/2022 1236   PLT 156 02/24/2021 1018   MCV 85.2 11/18/2022 1236   MCV 83 02/24/2021 1018   MCH 28.5 11/18/2022 1236   MCHC 33.5 11/18/2022 1236   RDW 13.6 11/18/2022 1236   RDW 14.2 02/24/2021 1018   LYMPHSABS 1.8 11/18/2022 1236   LYMPHSABS 1.8 02/24/2021 1018   MONOABS 0.4 11/18/2022 1236   EOSABS 0.1 11/18/2022 1236   EOSABS 0.1 02/24/2021 1018   BASOSABS 0.0 11/18/2022 1236   BASOSABS 0.0 02/24/2021 1018    CMP     Component Value Date/Time   NA 140 11/18/2022 1236   NA 143 02/24/2021  1018   K 3.6 11/18/2022 1236   CL 106 11/18/2022 1236   CO2 27 11/18/2022 1236   GLUCOSE 132 (H) 11/18/2022 1236   BUN 16 11/18/2022 1236   BUN 12 02/24/2021 1018   CREATININE 0.97 11/18/2022 1236   CREATININE 0.89 12/12/2012 0813   CALCIUM 9.3 11/18/2022 1236   PROT 6.6 11/18/2022 1236   PROT 6.8 02/24/2021 1018   ALBUMIN 3.9 11/18/2022 1236   ALBUMIN 4.6 02/24/2021 1018   AST 21 11/18/2022 1236   ALT 15 11/18/2022 1236   ALKPHOS 105 11/18/2022 1236   BILITOT 0.8 11/18/2022 1236   GFRNONAA >60 11/18/2022 1236   GFRAA 76 02/06/2020 1054          ASSESSMENT and THERAPY PLAN:   Malignant neoplasm of upper-outer quadrant of left breast in female, estrogen receptor positive (HCC) 09/07/2021:Screening mammogram detected left breast mass by ultrasound 2 o'clock position measured 1.4 cm axilla was negative.  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 adjuvant chemotherapy with Taxol Herceptin weekly x12 followed by Herceptin maintenance every 3 weeks for 1 year 2.  Adjuvant radiation therapy until 04/13/2022 3.  Adjuvant antiestrogen therapy ----------------------------------------------------------------------------------------------------------------------------- Current treatment: Herceptin maintenance therapy (until September 2024) Toxicities: Intermittent diarrhea Echocardiogram  08/05/2022: EF 60 to 65%  Anastrozole toxicities: Vaginal dryness.  Recommended Vitamin E Suppositories and Coconut oil as lubrication Bone Health: bone density testing scheduled 04/2023.  Reviewed calcium, vitamin d and weight bearing exercise.    Breast cancer surveillance: Breast exam 01/08/2023: Benign Mammogram 09/26/2022: Benign density category  B  Recommended healthy diet and exercise.     RTC 07/2023 for f/u with Dr. Pamelia Hoit.       All questions were answered. The patient knows to call the clinic with any problems, questions or concerns. We can certainly see the patient much sooner if necessary.  Total encounter time:30 minutes*in face-to-face visit time, chart review, lab review, care coordination, order entry, and documentation of the encounter time.  Lillard Anes, NP 01/08/23 1:09 PM Medical Oncology and Hematology [facility] [address] [place], [place] [address] Tel. [phone]    Fax. [phone]  *Total Encounter Time as defined by the Centers for Medicare and Medicaid Services includes, in addition to the face-to-face time of a patient visit (documented in the note above) non-face-to-face time: obtaining and reviewing outside history, ordering and reviewing medications, tests or procedures, care coordination (communications with other health care professionals or  caregivers) and documentation in the medical record.

## 2023-01-11 LAB — SIGNATERA
SIGNATERA MTM READOUT: 0 MTM/ml
SIGNATERA TEST RESULT: NEGATIVE

## 2023-02-08 ENCOUNTER — Other Ambulatory Visit: Payer: Self-pay | Admitting: Family Medicine

## 2023-02-13 ENCOUNTER — Encounter (INDEPENDENT_AMBULATORY_CARE_PROVIDER_SITE_OTHER): Payer: Self-pay | Admitting: *Deleted

## 2023-02-13 ENCOUNTER — Ambulatory Visit: Payer: Medicare Other | Attending: Surgery

## 2023-02-13 VITALS — Wt 224.4 lb

## 2023-02-13 DIAGNOSIS — Z483 Aftercare following surgery for neoplasm: Secondary | ICD-10-CM | POA: Insufficient documentation

## 2023-02-13 NOTE — Therapy (Signed)
OUTPATIENT PHYSICAL THERAPY SOZO SCREENING NOTE   Patient Name: Karen Vazquez MRN: [id number] DOB:September 12, 1955, 67 y.o., female Today's Date: 02/13/2023  PCP: Babs Sciara, MD REFERRING PROVIDER: Harriette Bouillon, MD   PT End of Session - 02/13/23 0902     Visit Number 2   # unchanged due to screen only   PT Start Time 0900    PT Stop Time 0904    PT Time Calculation (min) 4 min    Activity Tolerance Patient tolerated treatment well    Behavior During Therapy [facility] for tasks assessed/performed             Past Medical History:  Diagnosis Date   Allergy 15 years ago   Dry cough   Anemia    Anxiety    Arthritis    Back pain    Blood transfusion 2011   at [facility]   Bulging lumbar disc    Cancer [facility]) August 28, 2021   Breast cancer   Cataract 2022   Both taken care of in 2022   Chest pain    Diabetes mellitus without complication (HCC)    Dry mouth    Easy bruising    Excessive thirst    Fatigue    Frequent urination    GERD (gastroesophageal reflux disease)    Headache    Heart murmur 20 years ago   Doctor thinking since birth   Heat intolerance    Hypertension    Hypothyroidism    Joint pain    Leg pain    Muscle stiffness    Nervousness    Palpitations    Personal history of chemotherapy    Personal history of radiation therapy    Pre-diabetes    Restless leg syndrome    Shortness of breath on exertion    Sleep apnea    does not use c-pap machine   Stomach ulcer    Stress    Swelling of both lower extremities    Trouble in sleeping    Vitamin D deficiency    Weakness    Past Surgical History:  Procedure Laterality Date   ABDOMINAL HYSTERECTOMY  25 years ago   Partial hysterectomy. Still have ovaries   APPENDECTOMY     BIOPSY  05/16/2019   Procedure: BIOPSY;  Surgeon: Malissa Hippo, MD;  [facility];  Service: Endoscopy;;  duodenum antral   BLADDER SUSPENSION  12/29/2010   Procedure: TRANSVAGINAL TAPE (TVT) PROCEDURE;   Surgeon: Loney Laurence;  [facility];  Service: Gynecology;  Laterality: N/A;   BREAST LUMPECTOMY Left 10/26/2021   BREAST LUMPECTOMY WITH RADIOACTIVE SEED AND SENTINEL LYMPH NODE BIOPSY Left 10/26/2021   Procedure: LEFT BREAST SEED LUMPECTOMY WITH LEFT SENTINEL LYMPH NODE MAPPING;  Surgeon: Harriette Bouillon, MD;  Location: [facility];  Service: General;  Laterality: Left;   BREAST SURGERY     left breast lumpectomy 1977   CATARACT EXTRACTION     COLONOSCOPY N/A 03/03/2016   Procedure: COLONOSCOPY;  Surgeon: Malissa Hippo, MD;  [facility];  Service: Endoscopy;  Laterality: N/A;  2:00 - moved to 12/14 @ 12:55 - Ann notified pt   CYSTOCELE REPAIR  12/29/2010   Procedure: ANTERIOR REPAIR (CYSTOCELE);  Surgeon: Loney Laurence;  [facility];  Service: Gynecology;  Laterality: N/A;   CYSTOSCOPY  12/29/2010   Procedure: CYSTOSCOPY;  Surgeon: Loney Laurence;  [facility];  Service: Gynecology;  Laterality: N/A;   ESOPHAGOGASTRODUODENOSCOPY  N/A 05/16/2019   Procedure: ESOPHAGOGASTRODUODENOSCOPY (EGD);  Surgeon: Malissa Hippo, MD;  [facility];  Service: Endoscopy;  Laterality: N/A;   EYE SURGERY  2023   Cataracts   FRACTURE SURGERY  30 years ago   Broken ankle abs then broken left wrist in 2021   JOINT REPLACEMENT  2011   rt knee   PORTACATH PLACEMENT N/A 10/26/2021   Procedure: PORT PLACEMENT WITH ULTRASOUND GUIDANCE;  Surgeon: Harriette Bouillon, MD;  Location: [facility];  Service: General;  Laterality: N/A;   REPLACEMENT TOTAL KNEE Right 2011   TONSILLECTOMY     TOTAL KNEE ARTHROPLASTY Left 11/17/2014   Procedure: LEFT TOTAL KNEE ARTHROPLASTY;  Surgeon: Ollen Gross, MD;  [facility];  Service: Orthopedics;  Laterality: Left;   TUBAL LIGATION     VAGINAL HYSTERECTOMY  12/29/2010   Procedure: HYSTERECTOMY VAGINAL;  Surgeon: Loney Laurence;  [facility];  Service: Gynecology;  Laterality: N/A;    Patient Active Problem List   Diagnosis Date Noted   Port-A-Cath in place 11/25/2021   Genetic testing 09/20/2021   Family history of prostate cancer in father 09/15/2021   Family history of breast cancer 09/15/2021   Malignant neoplasm of upper-outer quadrant of left breast in female, estrogen receptor positive (HCC) 09/09/2021   Menopausal symptom 08/10/2021   UTI (urinary tract infection) 08/10/2021   Eating disorder 04/01/2021   Lesion of vulva 12/22/2020   History of iron deficiency anemia 10/27/2020   Cataract, nuclear sclerotic, both eyes 01/10/2020   Duodenal ulcer 06/24/2019   Fracture of distal end of radius 01/22/2018   History of total knee replacement, right 06/08/2017   History of colonic polyps 04/26/2016   OA (osteoarthritis) of knee 11/17/2014   Essential hypertension 05/28/2014   Obstructive sleep apnea 05/28/2014   Anemia, iron deficiency 12/16/2013   Prediabetes 05/01/2013   GERD (gastroesophageal reflux disease) 02/06/2013   Hypothyroidism 12/12/2012   Insomnia 12/12/2012   Hyperlipidemia 12/12/2012   Osteoarthritis 12/12/2012   Restless legs syndrome 12/12/2012   S/P LASIK surgery of both eyes 03/21/1998    REFERRING DIAG: left breast cancer at risk for lymphedema  THERAPY DIAG: Aftercare following surgery for neoplasm  PERTINENT HISTORY: Patient was diagnosed on 08/17/2021 with left grade 2 invasive ductal carcinoma breast cancer. It is ER positive, PR negative, and HER2 positive with a Ki67 of 55%. She underwent a left lumpectomy and sentinel node biopsy (3 negative nodes) on 10/26/2021. She had a benign left breast lump removed in 1977, has anemia, hypertension, and has had both knee replaced in 2011 and 2016 (unknown when right versus left were done).   PRECAUTIONS: left UE Lymphedema risk,   SUBJECTIVE: Pt is here for 3 month SOZO screening.  PAIN:  Are you having pain? No  SOZO SCREENING: Patient was assessed today using the SOZO machine to  determine the lymphedema index score. This was compared to her baseline score. It was determined that she is within the recommended range when compared to her baseline and no further action is needed at this time. She will continue SOZO screenings. These are done every 3 months for 2 years post operatively followed by every 6 months for 2 years, and then annually.   L-DEX FLOWSHEETS - 02/13/23 0900       L-DEX LYMPHEDEMA SCREENING   Measurement Type Unilateral    L-DEX MEASUREMENT EXTREMITY Upper Extremity    POSITION  Standing    DOMINANT SIDE Right  At Risk Side Left    BASELINE SCORE (UNILATERAL) 2.2    L-DEX SCORE (UNILATERAL) -0.6    VALUE CHANGE (UNILAT) -2.8                Hermenia Bers, PTA 02/13/2023, 9:04 AM

## 2023-04-14 ENCOUNTER — Encounter: Payer: Self-pay | Admitting: Family Medicine

## 2023-04-14 ENCOUNTER — Other Ambulatory Visit: Payer: Self-pay

## 2023-04-14 MED ORDER — SCOPOLAMINE 1 MG/3DAYS TD PT72: 1.0000 | MEDICATED_PATCH | TRANSDERMAL | 0 refills | Status: DC

## 2023-04-14 NOTE — Telephone Encounter (Signed)
Nurses Please send in 1 box of scopolamine patches Apply the patch right before travel Change it every 3 days Remove after the boat trip  Please let patient know that a small number of people do not tolerate scopolamine patches if it causes excessive dry mouth, significant blurred vision, or urinary retention to stop the patch  Hope she has a wonderful time on her cruise

## 2023-04-17 LAB — SIGNATERA
SIGNATERA MTM READOUT: 0 MTM/ml
SIGNATERA TEST RESULT: NEGATIVE

## 2023-04-20 ENCOUNTER — Ambulatory Visit (INDEPENDENT_AMBULATORY_CARE_PROVIDER_SITE_OTHER): Payer: Medicare Other | Admitting: Gastroenterology

## 2023-04-28 ENCOUNTER — Other Ambulatory Visit: Payer: Self-pay

## 2023-05-01 ENCOUNTER — Ambulatory Visit (INDEPENDENT_AMBULATORY_CARE_PROVIDER_SITE_OTHER): Payer: Medicare Other | Admitting: Gastroenterology

## 2023-05-02 ENCOUNTER — Other Ambulatory Visit: Payer: Self-pay | Admitting: Hematology and Oncology

## 2023-05-11 NOTE — Therapy (Signed)
 OUTPATIENT PHYSICAL THERAPY SOZO SCREENING NOTE   Patient Name: Karen Vazquez MRN: [id number] DOB:[birth year], 68 y.o., female Today's Date: 05/15/2023  PCP: Babs Sciara, MD REFERRING PROVIDER: Harriette Bouillon, MD   PT End of Session - 05/15/23 1034     Visit Number 2   unchanged due to screen only   PT Start Time 1032    PT Stop Time 1037    PT Time Calculation (min) 5 min              Past Medical History:  Diagnosis Date   Allergy 15 years ago   Dry cough   Anemia    Anxiety    Arthritis    Back pain    Blood transfusion 2011   at [facility]   Bulging lumbar disc    Cancer [facility]) August 28, 2021   Breast cancer   Cataract 2022   Both taken care of in 2022   Chest pain    Diabetes mellitus without complication (HCC)    Dry mouth    Easy bruising    Excessive thirst    Fatigue    Frequent urination    GERD (gastroesophageal reflux disease)    Headache    Heart murmur 20 years ago   Doctor thinking since birth   Heat intolerance    Hypertension    Hypothyroidism    Joint pain    Leg pain    Muscle stiffness    Nervousness    Palpitations    Personal history of chemotherapy    Personal history of radiation therapy    Pre-diabetes    Restless leg syndrome    Shortness of breath on exertion    Sleep apnea    does not use c-pap machine   Stomach ulcer    Stress    Swelling of both lower extremities    Trouble in sleeping    Vitamin D deficiency    Weakness    Past Surgical History:  Procedure Laterality Date   ABDOMINAL HYSTERECTOMY  25 years ago   Partial hysterectomy. Still have ovaries   APPENDECTOMY     BIOPSY  05/16/2019   Procedure: BIOPSY;  Surgeon: Malissa Hippo, MD;  [facility];  Service: Endoscopy;;  duodenum antral   BLADDER SUSPENSION  12/29/2010   Procedure: TRANSVAGINAL TAPE (TVT) PROCEDURE;  Surgeon: Loney Laurence;  [facility]: [facility];  Service: Gynecology;  Laterality: N/A;   BREAST LUMPECTOMY Left 10/26/2021    BREAST LUMPECTOMY WITH RADIOACTIVE SEED AND SENTINEL LYMPH NODE BIOPSY Left 10/26/2021   Procedure: LEFT BREAST SEED LUMPECTOMY WITH LEFT SENTINEL LYMPH NODE MAPPING;  Surgeon: Harriette Bouillon, MD;  Location: [facility];  Service: General;  Laterality: Left;   BREAST SURGERY     left breast lumpectomy 1977   CATARACT EXTRACTION     COLONOSCOPY N/A 03/03/2016   Procedure: COLONOSCOPY;  Surgeon: Malissa Hippo, MD;  [facility];  Service: Endoscopy;  Laterality: N/A;  2:00 - moved to 12/14 @ 12:55 - Ann notified pt   CYSTOCELE REPAIR  12/29/2010   Procedure: ANTERIOR REPAIR (CYSTOCELE);  Surgeon: Loney Laurence;  [facility];  Service: Gynecology;  Laterality: N/A;   CYSTOSCOPY  12/29/2010   Procedure: CYSTOSCOPY;  Surgeon: Loney Laurence;  [facility];  Service: Gynecology;  Laterality: N/A;   ESOPHAGOGASTRODUODENOSCOPY N/A 05/16/2019   Procedure: ESOPHAGOGASTRODUODENOSCOPY (EGD);  Surgeon: Malissa Hippo, MD;  [facility];  Service: Endoscopy;  Laterality: N/A;   EYE SURGERY  2023   Cataracts   FRACTURE SURGERY  30 years ago   Broken ankle abs then broken left wrist in 2021   JOINT REPLACEMENT  2011   rt knee   PORTACATH PLACEMENT N/A 10/26/2021   Procedure: PORT PLACEMENT WITH ULTRASOUND GUIDANCE;  Surgeon: Harriette Bouillon, MD;  Location: [facility];  Service: General;  Laterality: N/A;   REPLACEMENT TOTAL KNEE Right 2011   TONSILLECTOMY     TOTAL KNEE ARTHROPLASTY Left 11/17/2014   Procedure: LEFT TOTAL KNEE ARTHROPLASTY;  Surgeon: Ollen Gross, MD;  [facility];  Service: Orthopedics;  Laterality: Left;   TUBAL LIGATION     VAGINAL HYSTERECTOMY  12/29/2010   Procedure: HYSTERECTOMY VAGINAL;  Surgeon: Loney Laurence;  [facility];  Service: Gynecology;  Laterality: N/A;   Patient Active Problem List   Diagnosis Date Noted   Port-A-Cath in place 11/25/2021   Genetic testing 09/20/2021    Family history of prostate cancer in father 09/15/2021   Family history of breast cancer 09/15/2021   Malignant neoplasm of upper-outer quadrant of left breast in female, estrogen receptor positive (HCC) 09/09/2021   Menopausal symptom 08/10/2021   UTI (urinary tract infection) 08/10/2021   Eating disorder 04/01/2021   Lesion of vulva 12/22/2020   History of iron deficiency anemia 10/27/2020   Cataract, nuclear sclerotic, both eyes 01/10/2020   Duodenal ulcer 06/24/2019   Fracture of distal end of radius 01/22/2018   History of total knee replacement, right 06/08/2017   History of colonic polyps 04/26/2016   OA (osteoarthritis) of knee 11/17/2014   Essential hypertension 05/28/2014   Obstructive sleep apnea 05/28/2014   Anemia, iron deficiency 12/16/2013   Prediabetes 05/01/2013   GERD (gastroesophageal reflux disease) 02/06/2013   Hypothyroidism 12/12/2012   Insomnia 12/12/2012   Hyperlipidemia 12/12/2012   Osteoarthritis 12/12/2012   Restless legs syndrome 12/12/2012   S/P LASIK surgery of both eyes 03/21/1998    REFERRING DIAG: left breast cancer at risk for lymphedema  THERAPY DIAG: Malignant neoplasm of upper-outer quadrant of left breast in female, estrogen receptor positive (HCC)  PERTINENT HISTORY: Patient was diagnosed on 08/17/2021 with left grade 2 invasive ductal carcinoma breast cancer. It is ER positive, PR negative, and HER2 positive with a Ki67 of 55%. She underwent a left lumpectomy and sentinel node biopsy (3 negative nodes) on 10/26/2021. She had a benign left breast lump removed in 1977, has anemia, hypertension, and has had both knee replaced in 2011 and 2016 (unknown when right versus left were done).   PRECAUTIONS: left UE Lymphedema risk,   SUBJECTIVE: Pt is here for 3 month SOZO screening.  PAIN:  Are you having pain? No  SOZO SCREENING: Patient was assessed today using the SOZO machine to determine the lymphedema index score. This was compared to her  baseline score. It was determined that she is within the recommended range when compared to her baseline and no further action is needed at this time. She will continue SOZO screenings. These are done every 3 months for 2 years post operatively followed by every 6 months for 2 years, and then annually.   L-DEX FLOWSHEETS - 05/15/23 1000       L-DEX LYMPHEDEMA SCREENING   Measurement Type Unilateral    L-DEX MEASUREMENT EXTREMITY Upper Extremity    POSITION  Standing    DOMINANT SIDE Right    At Risk Side Left    BASELINE SCORE (UNILATERAL)  2.2    L-DEX SCORE (UNILATERAL) 0.8    VALUE CHANGE (UNILAT) -1.4                 [facility], PT 05/15/2023, 10:36 AM

## 2023-05-15 ENCOUNTER — Ambulatory Visit: Payer: Medicare Other | Attending: Surgery | Admitting: Physical Therapy

## 2023-05-15 ENCOUNTER — Ambulatory Visit
Admission: RE | Admit: 2023-05-15 | Discharge: 2023-05-15 | Disposition: A | Discharge status: Home / Self Care | Payer: Medicare Other | Source: Ambulatory Visit | Attending: Family Medicine | Admitting: Family Medicine

## 2023-05-15 DIAGNOSIS — Z0000 Encounter for general adult medical examination without abnormal findings: Secondary | ICD-10-CM

## 2023-05-15 DIAGNOSIS — Z780 Asymptomatic menopausal state: Secondary | ICD-10-CM

## 2023-05-15 DIAGNOSIS — Z170 Estrogen receptor positive status [ER+]: Secondary | ICD-10-CM | POA: Insufficient documentation

## 2023-05-15 DIAGNOSIS — C50412 Malignant neoplasm of upper-outer quadrant of left female breast: Secondary | ICD-10-CM | POA: Insufficient documentation

## 2023-05-16 ENCOUNTER — Other Ambulatory Visit: Payer: Self-pay

## 2023-05-16 DIAGNOSIS — M8580 Other specified disorders of bone density and structure, unspecified site: Secondary | ICD-10-CM

## 2023-05-17 ENCOUNTER — Other Ambulatory Visit: Payer: Self-pay | Admitting: Family Medicine

## 2023-05-17 ENCOUNTER — Encounter: Payer: Self-pay | Admitting: Family Medicine

## 2023-05-17 MED ORDER — ALENDRONATE SODIUM 70 MG PO TABS: 70.0000 mg | ORAL_TABLET | ORAL | 11 refills | Status: AC

## 2023-05-22 ENCOUNTER — Encounter: Payer: Self-pay | Admitting: Hematology and Oncology

## 2023-06-01 ENCOUNTER — Ambulatory Visit (INDEPENDENT_AMBULATORY_CARE_PROVIDER_SITE_OTHER): Payer: Medicare Other | Admitting: Gastroenterology

## 2023-06-01 ENCOUNTER — Encounter (INDEPENDENT_AMBULATORY_CARE_PROVIDER_SITE_OTHER): Payer: Self-pay | Admitting: Gastroenterology

## 2023-06-01 VITALS — BP 139/85 | HR 76 | Temp 97.6°F | Ht 64.0 in | Wt 225.0 lb

## 2023-06-01 DIAGNOSIS — K259 Gastric ulcer, unspecified as acute or chronic, without hemorrhage or perforation: Secondary | ICD-10-CM | POA: Diagnosis not present

## 2023-06-01 DIAGNOSIS — D509 Iron deficiency anemia, unspecified: Secondary | ICD-10-CM

## 2023-06-01 DIAGNOSIS — K269 Duodenal ulcer, unspecified as acute or chronic, without hemorrhage or perforation: Secondary | ICD-10-CM

## 2023-06-01 DIAGNOSIS — K449 Diaphragmatic hernia without obstruction or gangrene: Secondary | ICD-10-CM | POA: Insufficient documentation

## 2023-06-01 DIAGNOSIS — R197 Diarrhea, unspecified: Secondary | ICD-10-CM

## 2023-06-01 DIAGNOSIS — K529 Noninfective gastroenteritis and colitis, unspecified: Secondary | ICD-10-CM | POA: Insufficient documentation

## 2023-06-01 MED ORDER — OMEPRAZOLE 40 MG PO CPDR: 40.0000 mg | DELAYED_RELEASE_CAPSULE | Freq: Every day | ORAL | 3 refills | Status: AC

## 2023-06-01 NOTE — Progress Notes (Unsigned)
 Katrinka Blazing, M.D. Gastroenterology & Hepatology [facility] [address] [place], [place] [address]  Primary Care Physician: Babs Sciara, MD [address] [place] [address]  I will communicate my assessment and recommendations to the referring MD via EMR.  Problems: Chronic diarrhea Large hiatal hernia with Sheria Lang ulcers Iron-deficiency anemia secondary to [employer] ulcers  History of Present Illness: HAZELINE CHARNLEY is a 68 y.o. female with past medical history anxiety, cancer of the breast status left lumpectomy, postchemotherapy with Paclitaxel + Trastuzumab, anastrozole and radiation therapy, restless leg syndrome, GERD, diabetes, who presents for evaluation of diarrhea.  The patient was last seen on 10/27/2020, seen in the past by Dr. Karilyn Cota. Never seen by me in the past. At that time, the patient was continued on pantoprazole 40 mg q day and Flintsone multivitamin daily for management of iron deficiency anemia.  Patient reports that around the time she started her chemotherapy, she developed severe diarrhea - had multiple watery bowel movements per day. States that she was given Imodium for management of this, which was not helpful. She has noticed that he stools are currently very loose, has 3-4 loose Bms per day. Spicy or greasy foods may worsen her symptoms. She states that she is currently taking Lomotil twice a month. No nighttime episodes. She has urgency sometimes, but no fecal soiling. States overall her Bms are better compared to the time she was on chemotherapy.  Patient was recommended to have a repeat colonoscopy and evaluation for diarrhea by her oncologist, as she is no longer on trastuzumab.  Patient was taking pantoprazole 40 mg every other day, but she ran out of prescription and did not have it filled as it was possible it was not covered by her insurance. She was supposed to take this for [facility]  ulcers.  The patient denies having any nausea, vomiting, fever, chills, hematochezia, melena, hematemesis, abdominal distention, abdominal pain, jaundice, pruritus or weight loss.  Last EGD: 04/2019 - Normal esophagus. - Z- line regular, 30 cm from the incisors. - 8 cm hiatal hernia. - Erosive gastropathy with no stigmata of recent bleeding. - Non- bleeding duodenal ulcer with no stigmata of bleeding. - Normal second portion of the duodenum.  Last Colonoscopy: 2017 - One small polyp in the distal sigmoid colon, removed with a cold snare. Resected and retrieved. - External hemorrhoids.  Recommended repeat in 7 years  Past Medical History: Past Medical History:  Diagnosis Date   Allergy 15 years ago   Dry cough   Anemia    Anxiety    Arthritis    Back pain    Blood transfusion 2011   at [facility]   Bulging lumbar disc    Cancer [facility]) August 28, 2021   Breast cancer   Cataract 2022   Both taken care of in 2022   Chest pain    Diabetes mellitus without complication (HCC)    Dry mouth    Easy bruising    Excessive thirst    Fatigue    Frequent urination    GERD (gastroesophageal reflux disease)    Headache    Heart murmur 20 years ago   Doctor thinking since birth   Heat intolerance    Hypertension    Hypothyroidism    Joint pain    Leg pain    Muscle stiffness    Nervousness    Palpitations    Personal history of chemotherapy    Personal history  of radiation therapy    Pre-diabetes    Restless leg syndrome    Shortness of breath on exertion    Sleep apnea    does not use c-pap machine   Stomach ulcer    Stress    Swelling of both lower extremities    Trouble in sleeping    Vitamin D deficiency    Weakness     Past Surgical History: Past Surgical History:  Procedure Laterality Date   ABDOMINAL HYSTERECTOMY  25 years ago   Partial hysterectomy. Still have ovaries   APPENDECTOMY     BIOPSY  05/16/2019   Procedure: BIOPSY;  Surgeon: Malissa Hippo, MD;  [facility];  Service: Endoscopy;;  duodenum antral   BLADDER SUSPENSION  12/29/2010   Procedure: TRANSVAGINAL TAPE (TVT) PROCEDURE;  Surgeon: Loney Laurence;  [facility];  Service: Gynecology;  Laterality: N/A;   BREAST LUMPECTOMY Left 10/26/2021   BREAST LUMPECTOMY WITH RADIOACTIVE SEED AND SENTINEL LYMPH NODE BIOPSY Left 10/26/2021   Procedure: LEFT BREAST SEED LUMPECTOMY WITH LEFT SENTINEL LYMPH NODE MAPPING;  Surgeon: Harriette Bouillon, MD;  Location: [facility];  Service: General;  Laterality: Left;   BREAST SURGERY     left breast lumpectomy 1977   CATARACT EXTRACTION     COLONOSCOPY N/A 03/03/2016   Procedure: COLONOSCOPY;  Surgeon: Malissa Hippo, MD;  [facility];  Service: Endoscopy;  Laterality: N/A;  2:00 - moved to 12/14 @ 12:55 - Ann notified pt   CYSTOCELE REPAIR  12/29/2010   Procedure: ANTERIOR REPAIR (CYSTOCELE);  Surgeon: Loney Laurence;  [facility];  Service: Gynecology;  Laterality: N/A;   CYSTOSCOPY  12/29/2010   Procedure: CYSTOSCOPY;  Surgeon: Loney Laurence;  [facility];  Service: Gynecology;  Laterality: N/A;   ESOPHAGOGASTRODUODENOSCOPY N/A 05/16/2019   Procedure: ESOPHAGOGASTRODUODENOSCOPY (EGD);  Surgeon: Malissa Hippo, MD;  [facility];  Service: Endoscopy;  Laterality: N/A;   EYE SURGERY  2023   Cataracts   FRACTURE SURGERY  30 years ago   Broken ankle abs then broken left wrist in 2021   JOINT REPLACEMENT  2011   rt knee   PORTACATH PLACEMENT N/A 10/26/2021   Procedure: PORT PLACEMENT WITH ULTRASOUND GUIDANCE;  Surgeon: Harriette Bouillon, MD;  Location: [facility];  Service: General;  Laterality: N/A;   REPLACEMENT TOTAL KNEE Right 2011   TONSILLECTOMY     TOTAL KNEE ARTHROPLASTY Left 11/17/2014   Procedure: LEFT TOTAL KNEE ARTHROPLASTY;  Surgeon: Ollen Gross, MD;  [facility];  Service: Orthopedics;  Laterality: Left;   TUBAL LIGATION     VAGINAL HYSTERECTOMY   12/29/2010   Procedure: HYSTERECTOMY VAGINAL;  Surgeon: Loney Laurence;  [facility];  Service: Gynecology;  Laterality: N/A;    Family History: Family History  Problem Relation Age of Onset   Stroke Mother    Hypertension Mother    Hyperlipidemia Mother    Thyroid disease Mother    Arthritis Mother    Heart disease Father    COPD Father    Depression Father    Prostate cancer Father 16 - 32   Cancer Father    Breast cancer Paternal Grandmother 59 - 54   Cancer Paternal Grandmother    Diabetes Paternal Grandfather    Colon cancer Cousin    Depression Brother    Depression Brother     Social History: Social History   Tobacco Use  Smoking  Status Never  Smokeless Tobacco Never   Social History   Substance and Sexual Activity  Alcohol Use No   Social History   Substance and Sexual Activity  Drug Use No    Allergies: Allergies  Allergen Reactions   Lisinopril Cough    Medications: Current Outpatient Medications  Medication Sig Dispense Refill   alendronate (FOSAMAX) 70 MG tablet Take 1 tablet (70 mg total) by mouth every 7 (seven) days. Take with a full glass of water on an empty stomach. 4 tablet 11   ALPRAZolam (XANAX) 0.5 MG tablet TAKE ONE TO TWO TABLETS BY MOUTH TWO TIMES DAILY AS NEEDED FOR ANXIETY OR SLEEP 30 tablet 3   amLODipine (NORVASC) 2.5 MG tablet Take 1 tablet (2.5 mg total) by mouth daily. 30 tablet 5   anastrozole (ARIMIDEX) 1 MG tablet TAKE ONE TABLET (1MG  TOTAL) BY MOUTH DAILY 90 tablet 3   ARMOUR THYROID 15 MG tablet TAKE ONE TABLET ONCE DAILY 30 tablet 5   diphenoxylate-atropine (LOMOTIL) 2.5-0.025 MG tablet Take 1 tablet by mouth 4 (four) times daily as needed for diarrhea or loose stools. 30 tablet 3   fluticasone (FLONASE) 50 MCG/ACT nasal spray USE 2 SPRAYS IN EACH NOSTRIL DAILY 16 g 1   Multiple Vitamins-Minerals (PRESERVISION AREDS PO) Take by mouth 2 (two) times daily.     oxybutynin (DITROPAN-XL) 10 MG 24 hr tablet Take 10  mg by mouth daily at 12 noon.     Pediatric Vitamins (MULTIVITAMIN GUMMIES CHILDRENS) CHEW FLINSTONES W/ IRON     pregabalin (LYRICA) 50 MG capsule 1 each evening 30 capsule 5   thyroid (ARMOUR THYROID) 30 MG tablet TAKE ONE (1) TABLET BY MOUTH EVERY DAY 30 tablet 5   No current facility-administered medications for this visit.    Review of Systems: GENERAL: negative for malaise, night sweats HEENT: No changes in hearing or vision, no nose bleeds or other nasal problems. NECK: Negative for lumps, goiter, pain and significant neck swelling RESPIRATORY: Negative for cough, wheezing CARDIOVASCULAR: Negative for chest pain, leg swelling, palpitations, orthopnea GI: SEE HPI MUSCULOSKELETAL: Negative for joint pain or swelling, back pain, and muscle pain. SKIN: Negative for lesions, rash PSYCH: Negative for sleep disturbance, mood disorder and recent psychosocial stressors. HEMATOLOGY Negative for prolonged bleeding, bruising easily, and swollen nodes. ENDOCRINE: Negative for cold or heat intolerance, polyuria, polydipsia and goiter. NEURO: negative for tremor, gait imbalance, syncope and seizures. The remainder of the review of systems is noncontributory.   Physical Exam: BP 139/85   Pulse 76   Temp 97.6 F (36.4 C)   Ht 5\' 4"  (1.626 m)   Wt 225 lb (102.1 kg)   LMP 05/24/2010   BMI 38.62 kg/m  GENERAL: The patient is AO x3, in no acute distress. HEENT: Head is normocephalic and atraumatic. EOMI are intact. Mouth is well hydrated and without lesions. NECK: Supple. No masses LUNGS: Clear to auscultation. No presence of rhonchi/wheezing/rales. Adequate chest expansion HEART: RRR, normal s1 and s2. ABDOMEN: Soft, nontender, no guarding, no peritoneal signs, and nondistended. BS +. No masses. EXTREMITIES: Without any cyanosis, clubbing, rash, lesions or edema. NEUROLOGIC: AOx3, no focal motor deficit. SKIN: no jaundice, no rashes  Imaging/Labs: as above  I personally reviewed and  interpreted the available labs, imaging and endoscopic files.  Impression and Plan: DEASIA CHIU is a 68 y.o. female with past medical history anxiety, cancer of the breast status left lumpectomy, postchemotherapy with Paclitaxel + Trastuzumab, anastrozole and radiation therapy, restless leg syndrome,  GERD, diabetes, who presents for evaluation of diarrhea.  Patient has presented recurrent issues with diarrhea after being on trastuzumab and paclitaxel.  Even though she has been off this medication she is presenting persistent diarrhea.  Diarrhea has improved after she has been off previous chemotherapy regimen.  She is still on anastrozole which can cause some changes in the bowel movements.  However, given the frequent bowel movements she is presenting, we will evaluate this further with a colonoscopy with random colonic biopsies.  She can continue taking Lomotil as needed for diarrhea episodes for now, as this has provided symptom relief.  Finally, she has history of iron-deficiency anemia due to [facility] ulcers.  I advised her that she should continue taking PPI daily to avoid chronic losses of blood from this erosions.  If her anemia were to worsen, may need to increase PPI to twice daily dosing and even consider surgical management of hiatal hernia.  -Start omeprazole 40 mg qday -Schedule  colonoscopy with random colonic biopsies -Continue Lomotil as needed for diarrhea episodes  All questions were answered.      Katrinka Blazing, MD Gastroenterology and Hepatology [facility]

## 2023-06-01 NOTE — Patient Instructions (Addendum)
 Start omeprazole 40 mg qday Schedule  colonoscopy with random colonic biopsies Continue Lomotil as needed for diarrhea episodes

## 2023-06-05 ENCOUNTER — Telehealth (INDEPENDENT_AMBULATORY_CARE_PROVIDER_SITE_OTHER): Payer: Self-pay | Admitting: Gastroenterology

## 2023-06-05 MED ORDER — PEG 3350-KCL-NA BICARB-NACL 420 G PO SOLR: 4000.0000 mL | Freq: Once | ORAL | 0 refills | Status: AC

## 2023-06-05 NOTE — Addendum Note (Signed)
 Addended by: Marlowe Shores on: 06/05/2023 01:33 PM   Modules accepted: Orders

## 2023-06-05 NOTE — Telephone Encounter (Signed)
 Pt returned call and scheduled TCS for 07/05/23. Instructions will be mailed to pt. No pa needed. Prep sent to pharmacy.   Pt states that provider and her had spoke about having TCS for re check of polyps, encounter form states chronic diarrhea. Will also add history of polyps for dx.

## 2023-06-05 NOTE — Telephone Encounter (Signed)
 [facility] to schedule TCS

## 2023-06-06 ENCOUNTER — Encounter: Payer: Self-pay | Admitting: Hematology and Oncology

## 2023-06-09 ENCOUNTER — Other Ambulatory Visit: Payer: Self-pay | Admitting: Family Medicine

## 2023-06-30 ENCOUNTER — Encounter (INDEPENDENT_AMBULATORY_CARE_PROVIDER_SITE_OTHER): Payer: Self-pay | Admitting: Gastroenterology

## 2023-07-05 ENCOUNTER — Other Ambulatory Visit: Payer: Self-pay

## 2023-07-05 ENCOUNTER — Encounter (INDEPENDENT_AMBULATORY_CARE_PROVIDER_SITE_OTHER): Payer: Self-pay | Admitting: *Deleted

## 2023-07-05 ENCOUNTER — Ambulatory Visit (HOSPITAL_COMMUNITY)
Admission: RE | Admit: 2023-07-05 | Discharge: 2023-07-05 | Disposition: A | Discharge status: Home / Self Care | Attending: Gastroenterology | Admitting: Gastroenterology

## 2023-07-05 ENCOUNTER — Ambulatory Visit (HOSPITAL_COMMUNITY): Admitting: Anesthesiology

## 2023-07-05 ENCOUNTER — Encounter (HOSPITAL_COMMUNITY)
Admission: RE | Disposition: A | Discharge status: Home / Self Care | Payer: Self-pay | Source: Home / Self Care | Attending: Gastroenterology

## 2023-07-05 ENCOUNTER — Encounter (HOSPITAL_COMMUNITY): Payer: Self-pay | Admitting: Gastroenterology

## 2023-07-05 ENCOUNTER — Ambulatory Visit (HOSPITAL_BASED_OUTPATIENT_CLINIC_OR_DEPARTMENT_OTHER): Admitting: Anesthesiology

## 2023-07-05 DIAGNOSIS — Z79811 Long term (current) use of aromatase inhibitors: Secondary | ICD-10-CM | POA: Insufficient documentation

## 2023-07-05 DIAGNOSIS — E039 Hypothyroidism, unspecified: Secondary | ICD-10-CM | POA: Diagnosis not present

## 2023-07-05 DIAGNOSIS — K449 Diaphragmatic hernia without obstruction or gangrene: Secondary | ICD-10-CM | POA: Insufficient documentation

## 2023-07-05 DIAGNOSIS — Z9221 Personal history of antineoplastic chemotherapy: Secondary | ICD-10-CM | POA: Insufficient documentation

## 2023-07-05 DIAGNOSIS — G2581 Restless legs syndrome: Secondary | ICD-10-CM | POA: Insufficient documentation

## 2023-07-05 DIAGNOSIS — F419 Anxiety disorder, unspecified: Secondary | ICD-10-CM | POA: Diagnosis not present

## 2023-07-05 DIAGNOSIS — K219 Gastro-esophageal reflux disease without esophagitis: Secondary | ICD-10-CM | POA: Insufficient documentation

## 2023-07-05 DIAGNOSIS — Z853 Personal history of malignant neoplasm of breast: Secondary | ICD-10-CM | POA: Insufficient documentation

## 2023-07-05 DIAGNOSIS — G4730 Sleep apnea, unspecified: Secondary | ICD-10-CM | POA: Insufficient documentation

## 2023-07-05 DIAGNOSIS — R197 Diarrhea, unspecified: Secondary | ICD-10-CM | POA: Insufficient documentation

## 2023-07-05 DIAGNOSIS — Z923 Personal history of irradiation: Secondary | ICD-10-CM | POA: Diagnosis not present

## 2023-07-05 DIAGNOSIS — Z8711 Personal history of peptic ulcer disease: Secondary | ICD-10-CM | POA: Diagnosis not present

## 2023-07-05 DIAGNOSIS — I10 Essential (primary) hypertension: Secondary | ICD-10-CM | POA: Insufficient documentation

## 2023-07-05 HISTORY — PX: COLONOSCOPY: SHX5424

## 2023-07-05 LAB — HM COLONOSCOPY

## 2023-07-05 SURGERY — COLONOSCOPY
Anesthesia: General

## 2023-07-05 MED ORDER — LACTATED RINGERS IV SOLN: INTRAVENOUS | Status: DC | PRN

## 2023-07-05 MED ORDER — PROPOFOL 10 MG/ML IV BOLUS
INTRAVENOUS | Status: DC | PRN
  Administered 2023-07-05: 120 mg via INTRAVENOUS
  Administered 2023-07-05: 125 ug/kg/min via INTRAVENOUS
  Administered 2023-07-05: 60 mg via INTRAVENOUS

## 2023-07-05 NOTE — Op Note (Signed)
 [facility] Patient Name: Karen Vazquez Procedure Date: 07/05/2023 7:01 AM MRN: [id number] Date of Birth: 1955/06/14 Attending MD: Katrinka Blazing , , [id number] CSN: [id number] Age: 68 Admit Type: Outpatient Procedure:                Colonoscopy Indications:              Clinically significant diarrhea of unexplained                            origin Providers:                Katrinka Blazing, Edrick Kins, RN, Lennice Sites                            Technician, Technician, [place] Wilson, Technician Referring MD:              Medicines:                Monitored Anesthesia Care Complications:            No immediate complications. Estimated Blood Loss:     Estimated blood loss: none. Procedure:                Pre-Anesthesia Assessment:                           - Prior to the procedure, a History and Physical                            was performed, and patient medications, allergies                            and sensitivities were reviewed. The patient's                            tolerance of previous anesthesia was reviewed.                           - The risks and benefits of the procedure and the                            sedation options and risks were discussed with the                            patient. All questions were answered and informed                            consent was obtained.                           - ASA Grade Assessment: II - A patient with mild                            systemic disease.                           After obtaining informed consent, the colonoscope  was passed under direct vision. Throughout the                            procedure, the patient's blood pressure, pulse, and                            oxygen saturations were monitored continuously. The                            [id number]-HQ190L ([id number]) scope was introduced through                            the anus and advanced to the the terminal ileum.                             The colonoscopy was performed without difficulty.                            The patient tolerated the procedure well. The                            quality of the bowel preparation was excellent. Scope In: 7:47:44 AM Scope Out: 7:59:41 AM Scope Withdrawal Time: 0 hours 8 minutes 45 seconds  Total Procedure Duration: 0 hours 11 minutes 57 seconds  Findings:      The perianal and digital rectal examinations were normal.      The terminal ileum appeared normal.      The entire examined colon appeared normal on direct and retroflexion       views. Biopsies were taken with a cold forceps for histology. Biopsies       for histology were taken with a cold forceps from the right colon and       left colon for evaluation of microscopic colitis. Impression:               - The examined portion of the ileum was normal.                           - The entire examined colon is normal on direct and                            retroflexion views. Moderate Sedation:      Per Anesthesia Care Recommendation:           - Discharge patient to home (ambulatory).                           - Resume previous diet.                           - Await pathology results.                           - Repeat colonoscopy in 10 years for screening                            purposes. Procedure  Code(s):        --- Professional ---                           [phone], Colonoscopy, flexible; with biopsy, single                            or multiple Diagnosis Code(s):        --- Professional ---                           R19.7, Diarrhea, unspecified CPT copyright 2022 American Medical Association. All rights reserved. The codes documented in this report are preliminary and upon coder review may  be revised to meet current compliance requirements. Samantha Cress, MD Samantha Cress,  07/05/2023 8:04:26 AM This report has been signed electronically. Number of Addenda: 0

## 2023-07-05 NOTE — Transfer of Care (Signed)
 Immediate Anesthesia Transfer of Care Note  Patient: Karen Vazquez  Procedure(s) Performed: COLONOSCOPY  Patient Location: Endoscopy Unit  Anesthesia Type:General  Level of Consciousness: awake and patient cooperative  Airway & Oxygen Therapy: Patient Spontanous Breathing  Post-op Assessment: Report given to RN and Post -op Vital signs reviewed and stable  Post vital signs: Reviewed and stable  Last Vitals:  Vitals Value Taken Time  BP 151/77 07/05/23 0804  Temp 36.4 C 07/05/23 0804  Pulse 76 07/05/23 0804  Resp 15 07/05/23 0804  SpO2 97 % 07/05/23 0804    Last Pain:  Vitals:   07/05/23 0804  TempSrc: Oral  PainSc: 0-No pain      Patients Stated Pain Goal: 6 (07/05/23 0656)  Complications: No notable events documented.

## 2023-07-05 NOTE — Anesthesia Preprocedure Evaluation (Signed)
 Anesthesia Evaluation  Patient identified by MRN, date of birth, ID band Patient awake    Airway Mallampati: II  TM Distance: >3 FB Neck ROM: Full    Dental no notable dental hx.    Pulmonary neg pulmonary ROS, sleep apnea    Pulmonary exam normal breath sounds clear to auscultation       Cardiovascular Exercise Tolerance: Good hypertension, Normal cardiovascular exam+ Valvular Problems/Murmurs  Rhythm:Regular Rate:Normal     Neuro/Psych  Headaches PSYCHIATRIC DISORDERS Anxiety      Neuromuscular disease    GI/Hepatic Neg liver ROS, hiatal hernia, PUD,GERD  ,,  Endo/Other  Hypothyroidism    Renal/GU negative Renal ROS  negative genitourinary   Musculoskeletal negative musculoskeletal ROS (+) Arthritis ,    Abdominal Normal abdominal exam  (+)   Peds  Hematology  (+) Blood dyscrasia, anemia   Anesthesia Other Findings   Reproductive/Obstetrics                             Anesthesia Physical Anesthesia Plan  ASA: 2  Anesthesia Plan: General   Post-op Pain Management:    Induction: Intravenous  PONV Risk Score and Plan: Propofol infusion  Airway Management Planned: Nasal Cannula  Additional Equipment:   Intra-op Plan:   Post-operative Plan:   Informed Consent: I have reviewed the patients History and Physical, chart, labs and discussed the procedure including the risks, benefits and alternatives for the proposed anesthesia with the patient or authorized representative who has indicated his/her understanding and acceptance.       Plan Discussed with: CRNA  Anesthesia Plan Comments:        Anesthesia Quick Evaluation

## 2023-07-05 NOTE — H&P (Signed)
 Karen Vazquez is an 68 y.o. female.   Chief Complaint:diarrhea HPI: Karen Vazquez is a 68 y.o. female with past medical history anxiety, cancer of the breast status left lumpectomy, postchemotherapy with Paclitaxel + Trastuzumab, anastrozole and radiation therapy, restless leg syndrome, GERD, diabetes, who presents for evaluation of diarrhea.   Patient reports history of diarrhea she states is not as bad as before.  No melena, hematochezia, abdominal pain, nausea or vomiting.  Past Medical History:  Diagnosis Date   Allergy 15 years ago   Dry cough   Anemia    Anxiety    Arthritis    Back pain    Blood transfusion 2011   at [facility]   Bulging lumbar disc    Cancer [facility]) August 28, 2021   Breast cancer   Cataract 2022   Both taken care of in 2022   Chest pain    Dry mouth    Easy bruising    Excessive thirst    Fatigue    Frequent urination    GERD (gastroesophageal reflux disease)    Headache    Heart murmur 20 years ago   Doctor thinking since birth   Heat intolerance    Hypertension    Hypothyroidism    Joint pain    Leg pain    Muscle stiffness    Nervousness    Palpitations    Personal history of chemotherapy    Personal history of radiation therapy    Pre-diabetes    Restless leg syndrome    Shortness of breath on exertion    Sleep apnea    does not use c-pap machine   Stomach ulcer    Stress    Swelling of both lower extremities    Trouble in sleeping    Vitamin D deficiency    Weakness     Past Surgical History:  Procedure Laterality Date   ABDOMINAL HYSTERECTOMY  25 years ago   Partial hysterectomy. Still have ovaries   APPENDECTOMY     BIOPSY  05/16/2019   Procedure: BIOPSY;  Surgeon: Ruby Corporal, MD;  [facility]: [facility];  Service: Endoscopy;;  duodenum antral   BLADDER SUSPENSION  12/29/2010   Procedure: TRANSVAGINAL TAPE (TVT) PROCEDURE;  Surgeon: Oddis Bench;  Location: [facility];  Service: Gynecology;  Laterality: N/A;   BREAST  LUMPECTOMY Left 10/26/2021   BREAST LUMPECTOMY WITH RADIOACTIVE SEED AND SENTINEL LYMPH NODE BIOPSY Left 10/26/2021   Procedure: LEFT BREAST SEED LUMPECTOMY WITH LEFT SENTINEL LYMPH NODE MAPPING;  Surgeon: Sim Dryer, MD;  Location: [facility];  Service: General;  Laterality: Left;   BREAST SURGERY     left breast lumpectomy 1977   CATARACT EXTRACTION     COLONOSCOPY N/A 03/03/2016   Procedure: COLONOSCOPY;  Surgeon: Ruby Corporal, MD;  Location: [facility];  Service: Endoscopy;  Laterality: N/A;  2:00 - moved to 12/14 @ 12:55 - Ann notified pt   CYSTOCELE REPAIR  12/29/2010   Procedure: ANTERIOR REPAIR (CYSTOCELE);  Surgeon: Oddis Bench;  Location: [facility];  Service: Gynecology;  Laterality: N/A;   CYSTOSCOPY  12/29/2010   Procedure: CYSTOSCOPY;  Surgeon: Oddis Bench;  Location: [facility];  Service: Gynecology;  Laterality: N/A;   ESOPHAGOGASTRODUODENOSCOPY N/A 05/16/2019   Procedure: ESOPHAGOGASTRODUODENOSCOPY (EGD);  Surgeon: Ruby Corporal, MD;  Location: [facility];  Service: Endoscopy;  Laterality: N/A;   EYE SURGERY  2023   Cataracts   FRACTURE SURGERY  30  years ago   Broken ankle abs then broken left wrist in 2021   JOINT REPLACEMENT  2011   rt knee   PORTACATH PLACEMENT N/A 10/26/2021   Procedure: PORT PLACEMENT WITH ULTRASOUND GUIDANCE;  Surgeon: Harriette Bouillon, MD;  Location: [facility];  Service: General;  Laterality: N/A;   REPLACEMENT TOTAL KNEE Right 2011   TONSILLECTOMY     TOTAL KNEE ARTHROPLASTY Left 11/17/2014   Procedure: LEFT TOTAL KNEE ARTHROPLASTY;  Surgeon: Ollen Gross, MD;  Location: [facility];  Service: Orthopedics;  Laterality: Left;   TUBAL LIGATION     VAGINAL HYSTERECTOMY  12/29/2010   Procedure: HYSTERECTOMY VAGINAL;  Surgeon: Loney Laurence;  Location: [facility];  Service: Gynecology;  Laterality: N/A;    Family History  Problem Relation Age of Onset   Stroke Mother    Hypertension Mother     Hyperlipidemia Mother    Thyroid disease Mother    Arthritis Mother    Heart disease Father    COPD Father    Depression Father    Prostate cancer Father 60 - 2   Cancer Father    Breast cancer Paternal Grandmother 45 - 62   Cancer Paternal Grandmother    Diabetes Paternal Grandfather    Colon cancer Cousin    Depression Brother    Depression Brother    Social History:  reports that she has never smoked. She has never used smokeless tobacco. She reports that she does not drink alcohol and does not use drugs.  Allergies:  Allergies  Allergen Reactions   Lisinopril Cough    Medications Prior to Admission  Medication Sig Dispense Refill   alendronate (FOSAMAX) 70 MG tablet Take 1 tablet (70 mg total) by mouth every 7 (seven) days. Take with a full glass of water on an empty stomach. 4 tablet 11   amLODipine (NORVASC) 2.5 MG tablet Take 1 tablet (2.5 mg total) by mouth daily. 30 tablet 5   anastrozole (ARIMIDEX) 1 MG tablet TAKE ONE TABLET (1MG  TOTAL) BY MOUTH DAILY 90 tablet 3   ARMOUR THYROID 15 MG tablet TAKE ONE TABLET ONCE DAILY 30 tablet 5   diphenoxylate-atropine (LOMOTIL) 2.5-0.025 MG tablet Take 1 tablet by mouth 4 (four) times daily as needed for diarrhea or loose stools. 30 tablet 3   fluticasone (FLONASE) 50 MCG/ACT nasal spray USE 2 SPRAYS IN EACH NOSTRIL DAILY (Patient taking differently: Place 2 sprays into both nostrils daily as needed.) 16 g 1   Multiple Vitamins-Minerals (PRESERVISION AREDS PO) Take by mouth 2 (two) times daily.     omeprazole (PRILOSEC) 40 MG capsule Take 1 capsule (40 mg total) by mouth daily. 90 capsule 3   oxybutynin (DITROPAN-XL) 10 MG 24 hr tablet Take 10 mg by mouth daily at 12 noon.     Pediatric Vitamins (MULTIVITAMIN GUMMIES CHILDRENS) CHEW FLINSTONES W/ IRON     pregabalin (LYRICA) 50 MG capsule 1 each evening 30 capsule 5   thyroid (ARMOUR THYROID) 30 MG tablet TAKE ONE (1) TABLET BY MOUTH EVERY DAY 30 tablet 5   ALPRAZolam (XANAX) 0.5  MG tablet TAKE ONE TO TWO TABLETS BY MOUTH TWO TIMES DAILY AS NEEDED FOR ANXIETY OR SLEEP 30 tablet 5    No results found for this or any previous visit (from the past 48 hours). No results found.  Review of Systems  Gastrointestinal:  Positive for diarrhea.    Blood pressure (!) 152/83, pulse 78, temperature 97.8 F (36.6 C), temperature source Oral, resp.  rate 17, height 5\' 3"  (1.6 m), weight 98.4 kg, last menstrual period 05/24/2010, SpO2 95%. Physical Exam  GENERAL: The patient is AO x3, in no acute distress. HEENT: Head is normocephalic and atraumatic. EOMI are intact. Mouth is well hydrated and without lesions. NECK: Supple. No masses LUNGS: Clear to auscultation. No presence of rhonchi/wheezing/rales. Adequate chest expansion HEART: RRR, normal s1 and s2. ABDOMEN: Soft, nontender, no guarding, no peritoneal signs, and nondistended. BS +. No masses. EXTREMITIES: Without any cyanosis, clubbing, rash, lesions or edema. NEUROLOGIC: AOx3, no focal motor deficit. SKIN: no jaundice, no rashes  Assessment/Plan Karen Vazquez is a 69 y.o. female with past medical history anxiety, cancer of the breast status left lumpectomy, postchemotherapy with Paclitaxel + Trastuzumab, anastrozole and radiation therapy, restless leg syndrome, GERD, diabetes, who presents for evaluation of diarrhea.  Will proceed with colonoscopy.  Urban Garden, MD 07/05/2023, 7:32 AM

## 2023-07-05 NOTE — Discharge Instructions (Signed)
You are being discharged to home.  Resume your previous diet.  We are waiting for your pathology results.  Your physician has recommended a repeat colonoscopy in 10 years for screening purposes.

## 2023-07-05 NOTE — Anesthesia Procedure Notes (Signed)
 Date/Time: 07/05/2023 7:41 AM  Performed by: Verline Glow, CRNAPre-anesthesia Checklist: Patient identified, Emergency Drugs available, Suction available, Timeout performed and Patient being monitored Patient Re-evaluated:Patient Re-evaluated prior to induction Oxygen Delivery Method: Nasal Cannula

## 2023-07-05 NOTE — Anesthesia Postprocedure Evaluation (Signed)
 Anesthesia Post Note  Patient: Karen Vazquez  Procedure(s) Performed: COLONOSCOPY  Patient location during evaluation: PACU Anesthesia Type: General Level of consciousness: awake and alert Pain management: pain level controlled Vital Signs Assessment: post-procedure vital signs reviewed and stable Respiratory status: spontaneous breathing, nonlabored ventilation, respiratory function stable and patient connected to nasal cannula oxygen Cardiovascular status: stable and blood pressure returned to baseline Postop Assessment: no apparent nausea or vomiting Anesthetic complications: no  No notable events documented.   Last Vitals:  Vitals:   07/05/23 0656 07/05/23 0804  BP: (!) 152/83 (!) 151/77  Pulse: 78 76  Resp: 17 15  Temp: 36.6 C 36.4 C  SpO2: 95% 97%    Last Pain:  Vitals:   07/05/23 0804  TempSrc: Oral  PainSc: 0-No pain                 Beacher Limerick

## 2023-07-06 ENCOUNTER — Encounter (HOSPITAL_COMMUNITY): Payer: Self-pay | Admitting: Gastroenterology

## 2023-07-06 ENCOUNTER — Ambulatory Visit: Payer: Medicare Other | Admitting: Hematology and Oncology

## 2023-07-06 ENCOUNTER — Encounter: Payer: Self-pay | Admitting: Hematology and Oncology

## 2023-07-06 LAB — SURGICAL PATHOLOGY

## 2023-07-10 ENCOUNTER — Encounter (INDEPENDENT_AMBULATORY_CARE_PROVIDER_SITE_OTHER): Payer: Self-pay | Admitting: *Deleted

## 2023-07-13 ENCOUNTER — Inpatient Hospital Stay: Attending: Hematology and Oncology | Admitting: Hematology and Oncology

## 2023-07-13 VITALS — BP 139/84 | HR 73 | Temp 97.9°F | Resp 18 | Ht 63.0 in | Wt 221.6 lb

## 2023-07-13 DIAGNOSIS — Z79811 Long term (current) use of aromatase inhibitors: Secondary | ICD-10-CM | POA: Insufficient documentation

## 2023-07-13 DIAGNOSIS — K529 Noninfective gastroenteritis and colitis, unspecified: Secondary | ICD-10-CM | POA: Insufficient documentation

## 2023-07-13 DIAGNOSIS — C50412 Malignant neoplasm of upper-outer quadrant of left female breast: Secondary | ICD-10-CM | POA: Insufficient documentation

## 2023-07-13 DIAGNOSIS — Z79899 Other long term (current) drug therapy: Secondary | ICD-10-CM | POA: Diagnosis not present

## 2023-07-13 DIAGNOSIS — Z1731 Human epidermal growth factor receptor 2 positive status: Secondary | ICD-10-CM | POA: Diagnosis not present

## 2023-07-13 DIAGNOSIS — M8580 Other specified disorders of bone density and structure, unspecified site: Secondary | ICD-10-CM | POA: Insufficient documentation

## 2023-07-13 DIAGNOSIS — Z170 Estrogen receptor positive status [ER+]: Secondary | ICD-10-CM | POA: Diagnosis not present

## 2023-07-13 DIAGNOSIS — Z9221 Personal history of antineoplastic chemotherapy: Secondary | ICD-10-CM | POA: Diagnosis not present

## 2023-07-13 DIAGNOSIS — F5104 Psychophysiologic insomnia: Secondary | ICD-10-CM | POA: Insufficient documentation

## 2023-07-13 DIAGNOSIS — R053 Chronic cough: Secondary | ICD-10-CM | POA: Insufficient documentation

## 2023-07-13 DIAGNOSIS — Z1722 Progesterone receptor negative status: Secondary | ICD-10-CM | POA: Insufficient documentation

## 2023-07-13 DIAGNOSIS — Z923 Personal history of irradiation: Secondary | ICD-10-CM | POA: Diagnosis not present

## 2023-07-13 MED ORDER — DIPHENOXYLATE-ATROPINE 2.5-0.025 MG PO TABS: 1.0000 | ORAL_TABLET | Freq: Four times a day (QID) | ORAL | 3 refills | Status: DC | PRN

## 2023-07-13 NOTE — Assessment & Plan Note (Signed)
 09/07/2021:Screening mammogram detected left breast mass by ultrasound 2 o'clock position measured 1.4 cm axilla was negative.  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 adjuvant chemotherapy with Taxol  Herceptin  weekly x12 followed by Herceptin  maintenance every 3 weeks for 1 year completed 11/18/2022 2.  Adjuvant radiation therapy until 04/13/2022 3.  Adjuvant antiestrogen therapy ----------------------------------------------------------------------------------------------------------------------------- Current treatment: Anastrozole    Anastrozole  toxicities: Tolerating it well without any major problems   Microcytic anemia: Improved with iron  infusions.  Today's hemoglobin is 12.2 we will add ferritin to the next set of labs in 6 weeks.   Breast cancer surveillance: Breast exam 07/13/2023: Benign Mammogram 09/26/2022: Benign density category B MRD testing with Signatera: Negative Return to clinic in 1 year for follow-up

## 2023-07-13 NOTE — Progress Notes (Signed)
 Patient Care Team: Bennet Brasil, MD as PCP - General (Family Medicine) Sim Dryer, MD as Consulting Physician (General Surgery) Cameron Cea, MD as Consulting Physician (Hematology and Oncology) Colie Dawes, MD as Attending Physician (Radiation Oncology)  DIAGNOSIS:  Encounter Diagnosis  Name Primary?   Malignant neoplasm of upper-outer quadrant of left breast in female, estrogen receptor positive (HCC) Yes    SUMMARY OF ONCOLOGIC HISTORY: Oncology History  Malignant neoplasm of upper-outer quadrant of left breast in female, estrogen receptor positive (HCC)  09/07/2021 Initial Diagnosis   Screening mammogram detected left breast mass by ultrasound 2 o'clock position measured 1.4 cm axilla was negative.  Ultrasound-guided biopsy revealed grade 2 IDC with focal necrosis ER 90% weak, PR 0%, Ki-67 55%, HER2 3+ positive   09/15/2021 Cancer Staging   Staging form: Breast, AJCC 8th Edition - Clinical: Stage IA (cT1b, cN0, cM0, G3, ER-, PR-, HER2+) - Signed by Colie Dawes, MD on 03/22/2022 Stage prefix: Initial diagnosis Histologic grading system: 3 grade system    Genetic Testing   [facility] CustomNext Panel was Negative. Report date is 10/04/2021.  The CustomNext-Cancer+RNAinsight panel offered by [employer] includes sequencing and rearrangement analysis for the following 47 genes:  APC, ATM, AXIN2, BARD1, BMPR1A, BRCA1, BRCA2, BRIP1, CDH1, CDK4, CDKN2A, CHEK2, CTNNA1, DICER1, EPCAM, GREM1, HOXB13, KIT, MEN1, MLH1, MSH2, MSH3, MSH6, MUTYH, NBN, NF1, NTHL1, PALB2, PDGFRA, PMS2, POLD1, POLE, PTEN, RAD50, RAD51C, RAD51D, SDHA, SDHB, SDHC, SDHD, SMAD4, SMARCA4, STK11, TP53, TSC1, TSC2, and VHL.  RNA data is routinely analyzed for use in variant interpretation for all genes.   10/26/2021 Surgery   Left lumpectomy: Grade 3 IDC 1.9 cm with high-grade DCIS, margins negative, 0/3 lymph nodes negative, ER 90%, PR 0%, HER2 3+ positive, Ki-67 55%    10/26/2021 Cancer Staging   Staging form:  Breast, AJCC 8th Edition - Pathologic stage from 10/26/2021: Stage IA (pT1c, pN0, cM0, G3, ER+, PR-, HER2+) - Signed by Percival Brace, NP on 05/12/2022 Stage prefix: Initial diagnosis Histologic grading system: 3 grade system   11/25/2021 -  Chemotherapy   Patient is on Treatment Plan : BREAST Paclitaxel  + Trastuzumab  q7d / Trastuzumab  q21d     03/16/2022 - 04/13/2022 Radiation Therapy   40.05 Gy in 15 treatments "Boost": 10 Gy in 5 treatments   04/2022 -  Anti-estrogen oral therapy   Anastrozole  x 7 years     CHIEF COMPLIANT:   HISTORY OF PRESENT ILLNESS:  History of Present Illness The patient, with a history of breast cancer currently on anastrozole  and osteopenia on Fosamax , presents with chronic fatigue, difficulty sleeping, and chronic diarrhea. The patient reports feeling tired constantly, which she believes to be her new normal. Despite taking Xanax  0.5mg  at night, the patient struggles with sleep, often tossing and turning. The patient's partner reports that the patient often takes afternoon naps, which may be contributing to her difficulty sleeping at night. The patient also reports chronic diarrhea, which has improved since her immunotherapy but is still present. The patient's partner expresses concern about a chronic cough that the patient has had for approximately two years. The patient also mentions a hiatal hernia and is currently on omeprazole  for it.     ALLERGIES:  is allergic to lisinopril .  MEDICATIONS:  Current Outpatient Medications  Medication Sig Dispense Refill   alendronate  (FOSAMAX ) 70 MG tablet Take 1 tablet (70 mg total) by mouth every 7 (seven) days. Take with a full glass of water  on an empty stomach. 4 tablet 11  ALPRAZolam  (XANAX ) 0.5 MG tablet TAKE ONE TO TWO TABLETS BY MOUTH TWO TIMES DAILY AS NEEDED FOR ANXIETY OR SLEEP 30 tablet 5   amLODipine  (NORVASC ) 2.5 MG tablet Take 1 tablet (2.5 mg total) by mouth daily. 30 tablet 5   anastrozole   (ARIMIDEX ) 1 MG tablet TAKE ONE TABLET (1MG  TOTAL) BY MOUTH DAILY 90 tablet 3   ARMOUR THYROID  15 MG tablet TAKE ONE TABLET ONCE DAILY 30 tablet 5   Multiple Vitamins-Minerals (PRESERVISION AREDS PO) Take by mouth 2 (two) times daily.     omeprazole  (PRILOSEC) 40 MG capsule Take 1 capsule (40 mg total) by mouth daily. 90 capsule 3   oxybutynin  (DITROPAN -XL) 10 MG 24 hr tablet Take 10 mg by mouth daily at 12 noon.     Pediatric Vitamins (MULTIVITAMIN GUMMIES CHILDRENS) CHEW FLINSTONES W/ IRON      pregabalin  (LYRICA ) 50 MG capsule 1 each evening 30 capsule 5   thyroid  (ARMOUR THYROID ) 30 MG tablet TAKE ONE (1) TABLET BY MOUTH EVERY DAY 30 tablet 5   diphenoxylate -atropine  (LOMOTIL ) 2.5-0.025 MG tablet Take 1 tablet by mouth 4 (four) times daily as needed for diarrhea or loose stools. (Patient not taking: Reported on 07/13/2023) 30 tablet 3   fluticasone  (FLONASE ) 50 MCG/ACT nasal spray USE 2 SPRAYS IN EACH NOSTRIL DAILY (Patient not taking: Reported on 07/13/2023) 16 g 1   No current facility-administered medications for this visit.    PHYSICAL EXAMINATION: ECOG PERFORMANCE STATUS: 1 - Symptomatic but completely ambulatory  Vitals:   07/13/23 1100  BP: 139/84  Pulse: 73  Resp: 18  Temp: 97.9 F (36.6 C)  SpO2: 99%   Filed Weights   07/13/23 1100  Weight: 221 lb 9.6 oz (100.5 kg)      LABORATORY DATA:  I have reviewed the data as listed    Latest Ref Rng & Units 11/18/2022   12:36 PM 10/28/2022    2:36 PM 10/05/2022   10:34 AM  CMP  Glucose 70 - 99 mg/dL 161  90  83   BUN 8 - 23 mg/dL 16  10  16    Creatinine 0.44 - 1.00 mg/dL 0.96  0.45  4.09   Sodium 135 - 145 mmol/L 140  142  141   Potassium 3.5 - 5.1 mmol/L 3.6  3.9  4.0   Chloride 98 - 111 mmol/L 106  108  107   CO2 22 - 32 mmol/L 27  28  28    Calcium 8.9 - 10.3 mg/dL 9.3  9.0  9.7   Total Protein 6.5 - 8.1 g/dL 6.6  6.4  6.6   Total Bilirubin 0.3 - 1.2 mg/dL 0.8  0.4  0.6   Alkaline Phos 38 - 126 U/L 105  107  108   AST  15 - 41 U/L 21  16  16    ALT 0 - 44 U/L 15  11  12      Lab Results  Component Value Date   WBC 5.7 11/18/2022   HGB 12.9 11/18/2022   HCT 38.5 11/18/2022   MCV 85.2 11/18/2022   PLT 154 11/18/2022   NEUTROABS 3.4 11/18/2022    ASSESSMENT & PLAN:  Malignant neoplasm of upper-outer quadrant of left breast in female, estrogen receptor positive (HCC) 09/07/2021:Screening mammogram detected left breast mass by ultrasound 2 o'clock position measured 1.4 cm axilla was negative.  Ultrasound-guided biopsy revealed grade 2 IDC with focal necrosis ER 90% weak, PR 0%, Ki-67 55%, HER2 3+ positive   10/26/2021: Left lumpectomy: Grade  3 IDC 1.9 cm with high-grade DCIS, margins negative, 0/3 lymph nodes negative, ER 90%, PR 0%, HER2 3+ positive, Ki-67 55%   Treatment plan: 1. adjuvant chemotherapy with Taxol  Herceptin  weekly x12 followed by Herceptin  maintenance every 3 weeks for 1 year completed 11/18/2022 2.  Adjuvant radiation therapy until 04/13/2022 3.  Adjuvant antiestrogen therapy ----------------------------------------------------------------------------------------------------------------------------- Current treatment: Anastrozole    Anastrozole  toxicities: Tolerating it well without any major problems   Microcytic anemia: Improved with iron  infusions.  Today's hemoglobin is 12.2 we will add ferritin to the next set of labs in 6 weeks.   Breast cancer surveillance: Breast exam 07/13/2023: Benign Mammogram 09/26/2022: Benign density category B MRD testing with Signatera: Negative Return to clinic in 1 year for follow-up ------------------------------------- Assessment and Plan Assessment & Plan Malignant neoplasm of upper-outer quadrant of left breast Estrogen receptor-positive breast cancer in the upper-outer quadrant of the left breast. Currently on anastrozole . Discussed Mounjaro for weight loss to potentially reduce breast cancer risk by decreasing estrogen levels. Signatera test every  six months shows no recurrence. Consider generic semaglutide if cost is a concern. - Continue anastrozole  therapy. - Consider Mounjaro for weight loss. - Continue Signatera testing every six months. - Schedule mammogram in July.  Chronic diarrhea Chronic diarrhea possibly due to irritable bowel syndrome (IBS) with a history of immunotherapy. Lomotil  effective for symptom management. Discussed fiber, probiotics, and stress management for IBS. - Continue Lomotil  as needed. - Increase dietary fiber intake. - Consider probiotics such as Culturelle.  Osteopenia Osteopenia with T-score of -1.8, worsened from -1.3 in 2022. On Fosamax  for bone health. Emphasized proper administration to prevent esophageal irritation. - Continue Fosamax  weekly. - Monitor bone density.  Chronic insomnia Chronic insomnia with difficulty maintaining sleep. Currently on Xanax  0.5 mg at night. Discussed lifestyle modifications and potential use of Benadryl  or sleep Tylenol  on alternating days to enhance Xanax  efficacy. - Continue Xanax  0.5 mg at night. - Consider Benadryl  or sleep Tylenol  on alternating days. - Implement lifestyle modifications for sleep hygiene.  Chronic cough Chronic dry cough for two years. Differential includes acid reflux, bronchial asthma, and chronic sinus drainage. On omeprazole  for hiatal hernia, which may contribute to cough. - Continue omeprazole  for hiatal hernia.      No orders of the defined types were placed in this encounter.  The patient has a good understanding of the overall plan. she agrees with it. she will call with any problems that may develop before the next visit here. Total time spent: 30 mins including face to face time and time spent for planning, charting and co-ordination of care   Viinay K Timaya Bojarski, MD 07/13/23

## 2023-07-14 ENCOUNTER — Other Ambulatory Visit: Payer: Self-pay

## 2023-07-14 LAB — SIGNATERA
SIGNATERA MTM READOUT: 0 MTM/ml
SIGNATERA TEST RESULT: NEGATIVE

## 2023-08-25 ENCOUNTER — Other Ambulatory Visit: Payer: Self-pay | Admitting: Hematology and Oncology

## 2023-08-25 DIAGNOSIS — Z853 Personal history of malignant neoplasm of breast: Secondary | ICD-10-CM

## 2023-08-26 ENCOUNTER — Other Ambulatory Visit: Payer: Self-pay | Admitting: Family Medicine

## 2023-08-28 ENCOUNTER — Ambulatory Visit: Payer: Medicare Other | Attending: Surgery

## 2023-08-28 VITALS — Wt 218.1 lb

## 2023-08-28 DIAGNOSIS — Z483 Aftercare following surgery for neoplasm: Secondary | ICD-10-CM | POA: Insufficient documentation

## 2023-08-28 NOTE — Therapy (Signed)
 OUTPATIENT PHYSICAL THERAPY SOZO SCREENING NOTE   Patient Name: Karen Vazquez MRN: [id number] DOB:Jul 29, 1955, 68 y.o., female Today's Date: 08/28/2023  PCP: Bennet Brasil, MD REFERRING PROVIDER: Sim Dryer, MD   PT End of Session - 08/28/23 1006     Visit Number 2   # unchanged due to screen only   PT Start Time 1004    PT Stop Time 1008    PT Time Calculation (min) 4 min    Activity Tolerance Patient tolerated treatment well    Behavior During Therapy [facility] for tasks assessed/performed              Past Medical History:  Diagnosis Date   Allergy 15 years ago   Dry cough   Anemia    Anxiety    Arthritis    Back pain    Blood transfusion 2011   at [facility]   Bulging lumbar disc    Cancer [facility]) August 28, 2021   Breast cancer   Cataract 2022   Both taken care of in 2022   Chest pain    Dry mouth    Easy bruising    Excessive thirst    Fatigue    Frequent urination    GERD (gastroesophageal reflux disease)    Headache    Heart murmur 20 years ago   Doctor thinking since birth   Heat intolerance    Hypertension    Hypothyroidism    Joint pain    Leg pain    Muscle stiffness    Nervousness    Palpitations    Personal history of chemotherapy    Personal history of radiation therapy    Pre-diabetes    Restless leg syndrome    Shortness of breath on exertion    Sleep apnea    does not use c-pap machine   Stomach ulcer    Stress    Swelling of both lower extremities    Trouble in sleeping    Vitamin D  deficiency    Weakness    Past Surgical History:  Procedure Laterality Date   ABDOMINAL HYSTERECTOMY  25 years ago   Partial hysterectomy. Still have ovaries   APPENDECTOMY     BIOPSY  05/16/2019   Procedure: BIOPSY;  Surgeon: Ruby Corporal, MD;  [facility];  Service: Endoscopy;;  duodenum antral   BLADDER SUSPENSION  12/29/2010   Procedure: TRANSVAGINAL TAPE (TVT) PROCEDURE;  Surgeon: Oddis Bench;  Location: [facility];   Service: Gynecology;  Laterality: N/A;   BREAST LUMPECTOMY Left 10/26/2021   BREAST LUMPECTOMY WITH RADIOACTIVE SEED AND SENTINEL LYMPH NODE BIOPSY Left 10/26/2021   Procedure: LEFT BREAST SEED LUMPECTOMY WITH LEFT SENTINEL LYMPH NODE MAPPING;  Surgeon: Sim Dryer, MD;  Location: [facility];  Service: General;  Laterality: Left;   BREAST SURGERY     left breast lumpectomy 1977   CATARACT EXTRACTION     COLONOSCOPY N/A 03/03/2016   Procedure: COLONOSCOPY;  Surgeon: Ruby Corporal, MD;  [facility];  Service: Endoscopy;  Laterality: N/A;  2:00 - moved to 12/14 @ 12:55 - Ann notified pt   COLONOSCOPY N/A 07/05/2023   Procedure: COLONOSCOPY;  Surgeon: Urban Garden, MD;  [facility]: [facility];  Service: Gastroenterology;  Laterality: N/A;  7:30AM;ASA 1   CYSTOCELE REPAIR  12/29/2010   Procedure: ANTERIOR REPAIR (CYSTOCELE);  Surgeon: Oddis Bench;  Location: [facility];  Service: Gynecology;  Laterality: N/A;   CYSTOSCOPY  12/29/2010  Procedure: CYSTOSCOPY;  Surgeon: Oddis Bench;  Location: [facility];  Service: Gynecology;  Laterality: N/A;   ESOPHAGOGASTRODUODENOSCOPY N/A 05/16/2019   Procedure: ESOPHAGOGASTRODUODENOSCOPY (EGD);  Surgeon: Ruby Corporal, MD;  [facility];  Service: Endoscopy;  Laterality: N/A;   EYE SURGERY  2023   Cataracts   FRACTURE SURGERY  30 years ago   Broken ankle abs then broken left wrist in 2021   JOINT REPLACEMENT  2011   rt knee   PORTACATH PLACEMENT N/A 10/26/2021   Procedure: PORT PLACEMENT WITH ULTRASOUND GUIDANCE;  Surgeon: Sim Dryer, MD;  Location: [facility];  Service: General;  Laterality: N/A;   REPLACEMENT TOTAL KNEE Right 2011   TONSILLECTOMY     TOTAL KNEE ARTHROPLASTY Left 11/17/2014   Procedure: LEFT TOTAL KNEE ARTHROPLASTY;  Surgeon: Liliane Rei, MD;  Location: [facility];  Service: Orthopedics;  Laterality: Left;   TUBAL LIGATION     VAGINAL HYSTERECTOMY   12/29/2010   Procedure: HYSTERECTOMY VAGINAL;  Surgeon: Oddis Bench;  Location: [facility];  Service: Gynecology;  Laterality: N/A;   Patient Active Problem List   Diagnosis Date Noted   Large hiatal hernia 06/01/2023   Cameron ulcer 06/01/2023   Chronic diarrhea 06/01/2023   Port-A-Cath in place 11/25/2021   Genetic testing 09/20/2021   Family history of prostate cancer in father 09/15/2021   Family history of breast cancer 09/15/2021   Malignant neoplasm of upper-outer quadrant of left breast in female, estrogen receptor positive (HCC) 09/09/2021   Menopausal symptom 08/10/2021   UTI (urinary tract infection) 08/10/2021   Eating disorder 04/01/2021   Lesion of vulva 12/22/2020   History of iron  deficiency anemia 10/27/2020   Cataract, nuclear sclerotic, both eyes 01/10/2020   Duodenal ulcer 06/24/2019   Fracture of distal end of radius 01/22/2018   History of total knee replacement, right 06/08/2017   History of colonic polyps 04/26/2016   OA (osteoarthritis) of knee 11/17/2014   Essential hypertension 05/28/2014   Obstructive sleep apnea 05/28/2014   Anemia, iron  deficiency 12/16/2013   Prediabetes 05/01/2013   GERD (gastroesophageal reflux disease) 02/06/2013   Hypothyroidism 12/12/2012   Insomnia 12/12/2012   Hyperlipidemia 12/12/2012   Osteoarthritis 12/12/2012   Restless legs syndrome 12/12/2012   S/P LASIK surgery of both eyes 03/21/1998    REFERRING DIAG: left breast cancer at risk for lymphedema  THERAPY DIAG: Aftercare following surgery for neoplasm  PERTINENT HISTORY: Patient was diagnosed on 08/17/2021 with left grade 2 invasive ductal carcinoma breast cancer. It is ER positive, PR negative, and HER2 positive with a Ki67 of 55%. She underwent a left lumpectomy and sentinel node biopsy (3 negative nodes) on 10/26/2021. She had a benign left breast lump removed in 1977, has anemia, hypertension, and has had both knee replaced in 2011 and 2016 (unknown when right  versus left were done).   PRECAUTIONS: left UE Lymphedema risk,   SUBJECTIVE: Pt is here for 3 month SOZO screening.  PAIN:  Are you having pain? No  SOZO SCREENING: Patient was assessed today using the SOZO machine to determine the lymphedema index score. This was compared to her baseline score. It was determined that she is within the recommended range when compared to her baseline and no further action is needed at this time. She will continue SOZO screenings. These are done every 3 months for 2 years post operatively followed by every 6 months for 2 years, and then annually.   L-DEX FLOWSHEETS - 08/28/23 1000  L-DEX LYMPHEDEMA SCREENING   Measurement Type Unilateral    L-DEX MEASUREMENT EXTREMITY Upper Extremity    POSITION  Standing    DOMINANT SIDE Right    At Risk Side Left    BASELINE SCORE (UNILATERAL) 2.2    L-DEX SCORE (UNILATERAL) 0.1    VALUE CHANGE (UNILAT) -2.1                 Denyce Flank, PTA 08/28/2023, 10:07 AM

## 2023-08-29 NOTE — Telephone Encounter (Signed)
 Copied from CRM [id number]. Topic: Clinical - Medication Refill >> Aug 29, 2023  3:03 PM Essie A wrote: Medication: pregabalin  (LYRICA ) 50 MG capsule  Has the patient contacted their pharmacy? Yes (Agent: If no, request that the patient contact the pharmacy for the refill. If patient does not wish to contact the pharmacy document the reason why and proceed with request.) (Agent: If yes, when and what did the pharmacy advise?)  This is the patient's preferred pharmacy:  [facility] - [place], [place] - [address] [place] [address] Phone: [phone] Fax: [phone]  Is this the correct pharmacy for this prescription? Yes If no, delete pharmacy and type the correct one.   Has the prescription been filled recently? Yes  Is the patient out of the medication? Yes, one pill left  Has the patient been seen for an appointment in the last year OR does the patient have an upcoming appointment? Yes  Can we respond through MyChart? Yes  Agent: Please be advised that Rx refills may take up to 3 business days. We ask that you follow-up with your pharmacy.

## 2023-08-31 ENCOUNTER — Encounter (INDEPENDENT_AMBULATORY_CARE_PROVIDER_SITE_OTHER): Payer: Self-pay | Admitting: Gastroenterology

## 2023-09-08 ENCOUNTER — Ambulatory Visit: Payer: Medicare Other

## 2023-09-08 ENCOUNTER — Telehealth: Payer: Self-pay

## 2023-09-08 VITALS — Ht 63.0 in | Wt 218.0 lb

## 2023-09-08 DIAGNOSIS — Z0000 Encounter for general adult medical examination without abnormal findings: Secondary | ICD-10-CM | POA: Diagnosis not present

## 2023-09-08 NOTE — Patient Instructions (Signed)
 Ms. Blasko , Thank you for taking time out of your busy schedule to complete your Annual Wellness Visit with me. I enjoyed our conversation and look forward to speaking with you again next year. I, as well as your care team,  appreciate your ongoing commitment to your health goals. Please review the following plan we discussed and let me know if I can assist you in the future. Your Game plan/ To Do List    Follow up Visits: Next Medicare AWV with our clinical staff: In 1 year     Have you seen your provider in the last 6 months (3 months if uncontrolled diabetes)? No Next Office Visit with your provider: To be scheduled  Clinician Recommendations:  Aim for 30 minutes of exercise or brisk walking, 6-8 glasses of water , and 5 servings of fruits and vegetables each day.       This is a list of the screening recommended for you and due dates:  Health Maintenance  Topic Date Due   Pneumococcal Vaccine for age over 60 (2 of 2 - PCV) 07/21/2021   COVID-19 Vaccine (5 - 2024-25 season) 11/20/2022   Flu Shot  10/20/2023   Medicare Annual Wellness Visit  09/07/2024   Mammogram  09/25/2024   DTaP/Tdap/Td vaccine (2 - Td or Tdap) 01/02/2028   Colon Cancer Screening  07/04/2033   DEXA scan (bone density measurement)  Completed   Hepatitis C Screening  Completed   Zoster (Shingles) Vaccine  Completed   HPV Vaccine  Aged Out   Meningitis B Vaccine  Aged Out    Advanced directives: (ACP Link)Information on Advanced Care Planning can be found at [place]  Secretary of [facility] Advance Health Care Directives Advance Health Care Directives. [email or web address]   Advance Care Planning is important because it:  [x]  Makes sure you receive the medical care that is consistent with your values, goals, and preferences  [x]  It provides guidance to your family and loved ones and reduces their decisional burden about whether or not they are making the right decisions based on your wishes.  Follow the link provided  in your after visit summary or read over the paperwork we have mailed to you to help you started getting your Advance Directives in place. If you need assistance in completing these, please reach out to us  so that we can help you!  See attachments for Preventive Care and Fall Prevention Tips.

## 2023-09-08 NOTE — Telephone Encounter (Signed)
 Patient seen for AWV and states that she is having problems with staying asleep with taking alprazolam  alone.  Is wondering if there is something else that she can take.  Is also having problems with remembering to take fosamax  and wonders if there are any alternatives.

## 2023-09-08 NOTE — Progress Notes (Signed)
 Subjective:   Karen Vazquez is a 68 y.o. who presents for a Medicare Wellness preventive visit.  As a reminder, Annual Wellness Visits don't include a physical exam, and some assessments may be limited, especially if this visit is performed virtually. We may recommend an in-person follow-up visit with your provider if needed.  Visit Complete: Virtual I connected with  Karen Vazquez on 09/08/23 by a audio enabled telemedicine application and verified that I am speaking with the correct person using two identifiers.  Patient Location: Home  Provider Location: Home Office  I discussed the limitations of evaluation and management by telemedicine. The patient expressed understanding and agreed to proceed.  Vital Signs: Because this visit was a virtual/telehealth visit, some criteria may be missing or patient reported. Any vitals not documented were not able to be obtained and vitals that have been documented are patient reported.  VideoDeclined- This patient declined Librarian, academic. Therefore the visit was completed with audio only.  Persons Participating in Visit: Patient.  AWV Questionnaire: No: Patient Medicare AWV questionnaire was not completed prior to this visit.  Cardiac Risk Factors include: advanced age (>1men, >42 women);hypertension;obesity (BMI >30kg/m2)     Objective:    Today's Vitals   09/08/23 0936  Weight: 218 lb (98.9 kg)  Height: 5' 3 (1.6 m)   Body mass index is 38.62 kg/m.     09/08/2023    9:41 AM 07/05/2023    7:00 AM 10/05/2022   11:02 AM 08/24/2022    2:09 PM 08/19/2022    8:26 AM 08/03/2022    2:02 PM 07/15/2022    2:48 PM  Advanced Directives  Does Patient Have a Medical Advance Directive? No No No No No No No  Would patient like information on creating a medical advance directive? Yes (MAU/Ambulatory/Procedural Areas - Information given)  No - Patient declined No - Patient declined No - Patient declined No -  Patient declined No - Patient declined    Current Medications (verified) Outpatient Encounter Medications as of 09/08/2023  Medication Sig   alendronate  (FOSAMAX ) 70 MG tablet Take 1 tablet (70 mg total) by mouth every 7 (seven) days. Take with a full glass of water  on an empty stomach.   ALPRAZolam  (XANAX ) 0.5 MG tablet TAKE ONE TO TWO TABLETS BY MOUTH TWO TIMES DAILY AS NEEDED FOR ANXIETY OR SLEEP   amLODipine  (NORVASC ) 2.5 MG tablet Take 1 tablet (2.5 mg total) by mouth daily.   anastrozole  (ARIMIDEX ) 1 MG tablet TAKE ONE TABLET (1MG  TOTAL) BY MOUTH DAILY   ARMOUR THYROID  15 MG tablet TAKE ONE TABLET ONCE DAILY   diphenoxylate -atropine  (LOMOTIL ) 2.5-0.025 MG tablet Take 1 tablet by mouth 4 (four) times daily as needed for diarrhea or loose stools.   Multiple Vitamins-Minerals (PRESERVISION AREDS PO) Take by mouth 2 (two) times daily.   omeprazole  (PRILOSEC) 40 MG capsule Take 1 capsule (40 mg total) by mouth daily.   oxybutynin  (DITROPAN -XL) 10 MG 24 hr tablet Take 10 mg by mouth daily at 12 noon.   Pediatric Vitamins (MULTIVITAMIN GUMMIES CHILDRENS) CHEW FLINSTONES W/ IRON    pregabalin  (LYRICA ) 50 MG capsule TAKE ONE CAPSULE BY MOUTH EVERY EVENING   thyroid  (ARMOUR THYROID ) 30 MG tablet TAKE ONE (1) TABLET BY MOUTH EVERY DAY   tirzepatide (MOUNJARO) 2.5 MG/0.5ML Pen 2.5 mg Subcutaneous weekly for 30 days   fluticasone  (FLONASE ) 50 MCG/ACT nasal spray USE 2 SPRAYS IN EACH NOSTRIL DAILY (Patient not taking: Reported on 09/08/2023)  No facility-administered encounter medications on file as of 09/08/2023.    Allergies (verified) Lisinopril    History: Past Medical History:  Diagnosis Date   Allergy 15 years ago   Dry cough   Anemia    Anxiety    Arthritis    Back pain    Blood transfusion 2011   at [facility]   Blood transfusion without reported diagnosis 2011   After knee replacement   Breast cancer (HCC) 09/08/21   Bulging lumbar disc    Cancer (HCC) August 28, 2021   Breast cancer    Cataract 2022   Both taken care of in 2022   Chest pain    Dry mouth    Easy bruising    Excessive thirst    Fatigue    Frequent urination    GERD (gastroesophageal reflux disease)    Headache    Heart murmur 20 years ago   Doctor thinking since birth   Heat intolerance    Hypertension    Hypothyroidism    Joint pain    Leg pain    Muscle stiffness    Nervousness    Palpitations    Personal history of chemotherapy    Personal history of radiation therapy    Pre-diabetes    Restless leg syndrome    Shortness of breath on exertion    Sleep apnea    does not use c-pap machine   Stomach ulcer    Stress    Swelling of both lower extremities    Trouble in sleeping    Vitamin D  deficiency    Weakness    Past Surgical History:  Procedure Laterality Date   ABDOMINAL HYSTERECTOMY  25 years ago   Partial hysterectomy. Still have ovaries   APPENDECTOMY     BIOPSY  05/16/2019   Procedure: BIOPSY;  Surgeon: Ruby Corporal, MD;  [facility];  Service: Endoscopy;;  duodenum antral   BLADDER SUSPENSION  12/29/2010   Procedure: TRANSVAGINAL TAPE (TVT) PROCEDURE;  Surgeon: Oddis Bench;  Location: [facility];  Service: Gynecology;  Laterality: N/A;   BREAST LUMPECTOMY Left 10/26/2021   BREAST LUMPECTOMY WITH RADIOACTIVE SEED AND SENTINEL LYMPH NODE BIOPSY Left 10/26/2021   Procedure: LEFT BREAST SEED LUMPECTOMY WITH LEFT SENTINEL LYMPH NODE MAPPING;  Surgeon: Sim Dryer, MD;  Location: [facility];  Service: General;  Laterality: Left;   BREAST SURGERY     left breast lumpectomy 1977   CATARACT EXTRACTION     COLONOSCOPY N/A 03/03/2016   Procedure: COLONOSCOPY;  Surgeon: Ruby Corporal, MD;  [facility]: [facility];  Service: Endoscopy;  Laterality: N/A;  2:00 - moved to 12/14 @ 12:55 - Ann notified pt   COLONOSCOPY N/A 07/05/2023   Procedure: COLONOSCOPY;  Surgeon: Urban Garden, MD;  [facility]: [facility];  Service:  Gastroenterology;  Laterality: N/A;  7:30AM;ASA 1   CYSTOCELE REPAIR  12/29/2010   Procedure: ANTERIOR REPAIR (CYSTOCELE);  Surgeon: Oddis Bench;  Location: [facility];  Service: Gynecology;  Laterality: N/A;   CYSTOSCOPY  12/29/2010   Procedure: CYSTOSCOPY;  Surgeon: Oddis Bench;  Location: [facility];  Service: Gynecology;  Laterality: N/A;   ESOPHAGOGASTRODUODENOSCOPY N/A 05/16/2019   Procedure: ESOPHAGOGASTRODUODENOSCOPY (EGD);  Surgeon: Ruby Corporal, MD;  [facility];  Service: Endoscopy;  Laterality: N/A;   EYE SURGERY  2023   Cataracts   FRACTURE SURGERY  30 years ago   Broken ankle abs then broken left wrist in  2021   JOINT REPLACEMENT  2011   rt knee   PORTACATH PLACEMENT N/A 10/26/2021   Procedure: PORT PLACEMENT WITH ULTRASOUND GUIDANCE;  Surgeon: Sim Dryer, MD;  Location: [facility];  Service: General;  Laterality: N/A;   REPLACEMENT TOTAL KNEE Right 2011   TONSILLECTOMY     TOTAL KNEE ARTHROPLASTY Left 11/17/2014   Procedure: LEFT TOTAL KNEE ARTHROPLASTY;  Surgeon: Liliane Rei, MD;  Location: [facility];  Service: Orthopedics;  Laterality: Left;   TUBAL LIGATION     VAGINAL HYSTERECTOMY  12/29/2010   Procedure: HYSTERECTOMY VAGINAL;  Surgeon: Oddis Bench;  Location: [facility];  Service: Gynecology;  Laterality: N/A;   Family History  Problem Relation Age of Onset   Stroke Mother    Hypertension Mother    Hyperlipidemia Mother    Thyroid  disease Mother    Arthritis Mother    Heart disease Father    COPD Father    Depression Father    Prostate cancer Father 70 - 57   Cancer Father    Breast cancer Paternal Grandmother 74 - 69   Cancer Paternal Grandmother    Diabetes Paternal Grandfather    Colon cancer Cousin    Depression Brother    Depression Brother    Social History   Socioeconomic History   Marital status: Married    Spouse name: Currie Douse   Number of children: Not on file   Years of education: Not on file    Highest education level: Some college, no degree  Occupational History   Occupation: Retired  Tobacco Use   Smoking status: Never   Smokeless tobacco: Never  Vaping Use   Vaping status: Never Used  Substance and Sexual Activity   Alcohol use: No   Drug use: No   Sexual activity: Not Currently    Birth control/protection: Post-menopausal    Comment: HYST  Other Topics Concern   Not on file  Social History Narrative   Not on file   Social Drivers of Health   Financial Resource Strain: Low Risk  (09/08/2023)   Overall Financial Resource Strain (CARDIA)    Difficulty of Paying Living Expenses: Not very hard  Food Insecurity: No Food Insecurity (09/08/2023)   Hunger Vital Sign    Worried About Running Out of Food in the Last Year: Never true    Ran Out of Food in the Last Year: Never true  Transportation Needs: No Transportation Needs (09/08/2023)   PRAPARE - Administrator, Civil Service (Medical): No    Lack of Transportation (Non-Medical): No  Physical Activity: Insufficiently Active (09/08/2023)   Exercise Vital Sign    Days of Exercise per Week: 2 days    Minutes of Exercise per Session: 20 min  Stress: No Stress Concern Present (09/08/2023)   [employer] of Occupational Health - Occupational Stress Questionnaire    Feeling of Stress: Only a little  Social Connections: Socially Integrated (09/08/2023)   Social Connection and Isolation Panel    Frequency of Communication with Friends and Family: More than three times a week    Frequency of Social Gatherings with Friends and Family: Three times a week    Attends Religious Services: More than 4 times per year    Active Member of Clubs or Organizations: Yes    Attends Banker Meetings: More than 4 times per year    Marital Status: Married    Tobacco Counseling Counseling given: Not Answered    Clinical Intake:  Pre-visit preparation completed: Yes  Pain : No/denies pain     Diabetes:  No  Lab Results  Component Value Date   HGBA1C 5.3 02/24/2021   HGBA1C 5.3 08/20/2020   HGBA1C 5.3 07/24/2018     How often do you need to have someone help you when you read instructions, pamphlets, or other written materials from your doctor or pharmacy?: 1 - Never  Interpreter Needed?: No  Information entered by :: Seabron Cypress LPN   Activities of Daily Living     09/08/2023    9:40 AM  In your present state of health, do you have any difficulty performing the following activities:  Hearing? 0  Vision? 0  Difficulty concentrating or making decisions? 0  Walking or climbing stairs? 0  Dressing or bathing? 0  Doing errands, shopping? 0  Preparing Food and eating ? N  Using the Toilet? N  In the past six months, have you accidently leaked urine? N  Do you have problems with loss of bowel control? N  Managing your Medications? N  Managing your Finances? N  Housekeeping or managing your Housekeeping? N    Patient Care Team: Bennet Brasil, MD as PCP - General (Family Medicine) Sim Dryer, MD as Consulting Physician (General Surgery) Cameron Cea, MD as Consulting Physician (Hematology and Oncology) Colie Dawes, MD as Attending Physician (Radiation Oncology) Ladon Pickler, MD as Referring Physician (Ophthalmology)  I have updated your Care Teams any recent Medical Services you may have received from other providers in the past year.     Assessment:   This is a routine wellness examination for Karen Vazquez.  Hearing/Vision screen Hearing Screening - Comments:: Denies hearing difficulties   Vision Screening - Comments:: Wears rx glasses - up to date with routine eye exams with Dr. Lunda Salines    Goals Addressed             This Visit's Progress    Remain active and independent   On track      Depression Screen     09/08/2023    9:39 AM 10/31/2022    8:33 AM 08/19/2022    8:20 AM 08/10/2021    8:21 AM 01/06/2021   10:55 AM 08/20/2020    7:45 AM  02/11/2020    9:24 AM  PHQ 2/9 Scores  PHQ - 2 Score 0 0 0 0 0 3 0  PHQ- 9 Score  5    10     Fall Risk     09/08/2023    9:40 AM 10/31/2022    8:35 AM 08/19/2022    8:26 AM 08/15/2022    9:02 AM 08/10/2021    8:26 AM  Fall Risk   Falls in the past year? 0 0 0 0 0  Number falls in past yr: 0 0 0 0 0  Injury with Fall? 0 0 0 0 0  Risk for fall due to : No Fall Risks No Fall Risks No Fall Risks  No Fall Risks  Follow up Falls prevention discussed;Education provided;Falls evaluation completed Falls evaluation completed Falls prevention discussed  Falls prevention discussed      Data saved with a previous flowsheet row definition    MEDICARE RISK AT HOME:  Medicare Risk at Home Any stairs in or around the home?: Yes If so, are there any without handrails?: No Home free of loose throw rugs in walkways, pet beds, electrical cords, etc?: Yes Adequate lighting in your home to reduce risk of falls?: Yes Life  alert?: No Use of a cane, walker or w/c?: No Grab bars in the bathroom?: Yes Shower chair or bench in shower?: No Elevated toilet seat or a handicapped toilet?: Yes  TIMED UP AND GO:  Was the test performed?  No  Cognitive Function: 6CIT completed        09/08/2023    9:41 AM 08/19/2022    8:32 AM 08/10/2021    8:30 AM  6CIT Screen  What Year? 0 points 0 points 0 points  What month? 0 points 0 points 0 points  What time? 0 points 0 points 0 points  Count back from 20 0 points 0 points 0 points  Months in reverse 0 points 0 points 0 points  Repeat phrase 0 points 0 points 0 points  Total Score 0 points 0 points 0 points    Immunizations Immunization History  Administered Date(s) Administered   Fluad Quad(high Dose 65+) 01/05/2022   Influenza Split 12/12/2012   Influenza,inj,Quad PF,6+ Mos 01/02/2017, 01/01/2018   Influenza,inj,quad, With Preservative 11/19/2016   Influenza-Unspecified 12/20/2014, 12/28/2015, 12/19/2017, 01/31/2018, 12/12/2018, 01/16/2020   Moderna  Covid Bivalent Peds Booster(14mo Thru 10yrs) 01/29/2021   Moderna Sars-Covid-2 Vaccination 04/02/2019, 04/25/2019, 03/05/2020   PPD Test 10/31/2022   Pneumococcal Polysaccharide-23 07/21/2020   Tdap 01/01/2018   Zoster Recombinant(Shingrix) 01/31/2018, 07/31/2020    Screening Tests Health Maintenance  Topic Date Due   Pneumococcal Vaccine: 50+ Years (2 of 2 - PCV) 07/21/2021   COVID-19 Vaccine (5 - 2024-25 season) 11/20/2022   INFLUENZA VACCINE  10/20/2023   Medicare Annual Wellness (AWV)  09/07/2024   MAMMOGRAM  09/25/2024   DTaP/Tdap/Td (2 - Td or Tdap) 01/02/2028   Colonoscopy  07/04/2033   DEXA SCAN  Completed   Hepatitis C Screening  Completed   Zoster Vaccines- Shingrix  Completed   HPV VACCINES  Aged Out   Meningococcal B Vaccine  Aged Out    Health Maintenance  Health Maintenance Due  Topic Date Due   Pneumococcal Vaccine: 50+ Years (2 of 2 - PCV) 07/21/2021   COVID-19 Vaccine (5 - 2024-25 season) 11/20/2022   Health Maintenance Items Addressed: Scheduled for mammogram 09/26/23  Additional Screening:  Vision Screening: Recommended annual ophthalmology exams for early detection of glaucoma and other disorders of the eye. Would you like a referral to an eye doctor? No    Dental Screening: Recommended annual dental exams for proper oral hygiene  Community Resource Referral / Chronic Care Management: CRR required this visit?  No   CCM required this visit?  No   Plan:    I have personally reviewed and noted the following in the patient's chart:   Medical and social history Use of alcohol, tobacco or illicit drugs  Current medications and supplements including opioid prescriptions. Patient is not currently taking opioid prescriptions. Functional ability and status Nutritional status Physical activity Advanced directives List of other physicians Hospitalizations, surgeries, and ER visits in previous 12 months Vitals Screenings to include cognitive,  depression, and falls Referrals and appointments  In addition, I have reviewed and discussed with patient certain preventive protocols, quality metrics, and best practice recommendations. A written personalized care plan for preventive services as well as general preventive health recommendations were provided to patient.   Seabron Cypress [place], [place]   11/17/5619   After Visit Summary: (MyChart) Due to this being a telephonic visit, the after visit summary with patients personalized plan was offered to patient via MyChart   Notes: See telephone note

## 2023-09-09 ENCOUNTER — Other Ambulatory Visit: Payer: Self-pay

## 2023-09-11 ENCOUNTER — Other Ambulatory Visit: Payer: Self-pay | Admitting: Family Medicine

## 2023-09-11 NOTE — Telephone Encounter (Signed)
 Nurses 1.  In regards to sleep unfortunately there is no easy answers.  40% of all older adults have sleep issues.  Stronger sleeping medicines are not advised due to increased risk of falls.  If patient would prefer to do office visit to discuss further that is fine otherwise we recommend behavioral approaches.  2.-As for Fosamax  it would be best to try to take it the same day of each week if she misses it by 1 day that is not a big deal.  Insurance companies will not cover other measures when Fosamax  is not tolerated such as heartburn nausea and vomiting.  I would encourage trying harder with taking the Fosamax  and then it to problems to let us  know.  Thanks

## 2023-09-20 ENCOUNTER — Other Ambulatory Visit: Payer: Self-pay

## 2023-09-20 DIAGNOSIS — C50412 Malignant neoplasm of upper-outer quadrant of left female breast: Secondary | ICD-10-CM

## 2023-09-27 ENCOUNTER — Encounter

## 2023-09-28 ENCOUNTER — Ambulatory Visit (INDEPENDENT_AMBULATORY_CARE_PROVIDER_SITE_OTHER): Admitting: Gastroenterology

## 2023-10-04 ENCOUNTER — Ambulatory Visit
Admission: RE | Admit: 2023-10-04 | Discharge: 2023-10-04 | Disposition: A | Discharge status: Home / Self Care | Source: Ambulatory Visit | Attending: Hematology and Oncology | Admitting: Hematology and Oncology

## 2023-10-04 DIAGNOSIS — Z853 Personal history of malignant neoplasm of breast: Secondary | ICD-10-CM

## 2023-10-05 ENCOUNTER — Ambulatory Visit (INDEPENDENT_AMBULATORY_CARE_PROVIDER_SITE_OTHER): Admitting: Gastroenterology

## 2023-10-11 ENCOUNTER — Other Ambulatory Visit: Payer: Self-pay | Admitting: Family Medicine

## 2023-11-02 ENCOUNTER — Other Ambulatory Visit: Payer: Self-pay | Admitting: Nurse Practitioner

## 2023-11-02 ENCOUNTER — Encounter: Payer: Self-pay | Admitting: Family Medicine

## 2023-11-02 MED ORDER — NEOMYCIN-POLYMYXIN-HC 3.5-10000-1 OT SUSP: 4.0000 [drp] | Freq: Four times a day (QID) | OTIC | 0 refills | Status: DC

## 2023-11-06 ENCOUNTER — Ambulatory Visit (INDEPENDENT_AMBULATORY_CARE_PROVIDER_SITE_OTHER): Admitting: Family Medicine

## 2023-11-06 ENCOUNTER — Encounter: Payer: Self-pay | Admitting: Family Medicine

## 2023-11-06 VITALS — BP 110/80 | HR 102 | Temp 98.2°F | Ht 63.0 in | Wt 216.0 lb

## 2023-11-06 DIAGNOSIS — H6121 Impacted cerumen, right ear: Secondary | ICD-10-CM

## 2023-11-06 DIAGNOSIS — H6120 Impacted cerumen, unspecified ear: Secondary | ICD-10-CM | POA: Insufficient documentation

## 2023-11-06 NOTE — Progress Notes (Signed)
 Subjective:  Patient ID: Karen Vazquez Husband, female    DOB: Aug 24, 1955  Age: 68 y.o. MRN: [id number]  CC:   Chief Complaint  Patient presents with   right ear feelslike in a drum    Got water  in it 2 weeks ago has tried ear drops and no better, no pain reported    HPI:  68 year old female presents for evaluation of the above.  Patient reports a 2-week history of right ear fullness.  Occurred after she got water  in her ear.  Drops were recently seen in and she has had no improvement.  Denies any pain.  No relieving factors.  Patient states that she uses Q-tips on a regular basis.  No other complaints or concerns at this time.  Patient Active Problem List   Diagnosis Date Noted   Cerumen impaction 11/06/2023   Large hiatal hernia 06/01/2023   Cameron ulcer 06/01/2023   Chronic diarrhea 06/01/2023   Port-A-Cath in place 11/25/2021   Genetic testing 09/20/2021   Family history of prostate cancer in father 09/15/2021   Family history of breast cancer 09/15/2021   Malignant neoplasm of upper-outer quadrant of left breast in female, estrogen receptor positive (HCC) 09/09/2021   Eating disorder 04/01/2021   History of iron  deficiency anemia 10/27/2020   Cataract, nuclear sclerotic, both eyes 01/10/2020   History of total knee replacement, right 06/08/2017   History of colonic polyps 04/26/2016   OA (osteoarthritis) of knee 11/17/2014   Essential hypertension 05/28/2014   Obstructive sleep apnea 05/28/2014   Anemia, iron  deficiency 12/16/2013   Prediabetes 05/01/2013   GERD (gastroesophageal reflux disease) 02/06/2013   Hypothyroidism 12/12/2012   Insomnia 12/12/2012   Hyperlipidemia 12/12/2012   Osteoarthritis 12/12/2012   Restless legs syndrome 12/12/2012   S/P LASIK surgery of both eyes 03/21/1998    Social Hx   Social History   Socioeconomic History   Marital status: Married    Spouse name: Tanda   Number of children: Not on file   Years of education: Not on file    Highest education level: Some college, no degree  Occupational History   Occupation: Retired  Tobacco Use   Smoking status: Never   Smokeless tobacco: Never  Vaping Use   Vaping status: Never Used  Substance and Sexual Activity   Alcohol use: No   Drug use: No   Sexual activity: Not Currently    Birth control/protection: Post-menopausal    Comment: HYST  Other Topics Concern   Not on file  Social History Narrative   Not on file   Social Drivers of Health   Financial Resource Strain: Low Risk  (09/08/2023)   Overall Financial Resource Strain (CARDIA)    Difficulty of Paying Living Expenses: Not very hard  Food Insecurity: No Food Insecurity (09/08/2023)   Hunger Vital Sign    Worried About Running Out of Food in the Last Year: Never true    Ran Out of Food in the Last Year: Never true  Transportation Needs: No Transportation Needs (09/08/2023)   PRAPARE - Administrator, Civil Service (Medical): No    Lack of Transportation (Non-Medical): No  Physical Activity: Insufficiently Active (09/08/2023)   Exercise Vital Sign    Days of Exercise per Week: 2 days    Minutes of Exercise per Session: 20 min  Stress: No Stress Concern Present (09/08/2023)   [employer] of Occupational Health - Occupational Stress Questionnaire    Feeling of Stress: Only a little  Social Connections: Socially Integrated (09/08/2023)   Social Connection and Isolation Panel    Frequency of Communication with Friends and Family: More than three times a week    Frequency of Social Gatherings with Friends and Family: Three times a week    Attends Religious Services: More than 4 times per year    Active Member of Clubs or Organizations: Yes    Attends Banker Meetings: More than 4 times per year    Marital Status: Married    Review of Systems Per HPI  Objective:  BP 110/80   Pulse (!) 102   Temp 98.2 F (36.8 C)   Ht 5' 3 (1.6 m)   Wt 216 lb (98 kg)   LMP 05/24/2010    SpO2 96%   BMI 38.26 kg/m      11/06/2023    3:39 PM 09/08/2023    9:36 AM 08/28/2023   10:06 AM  BP/Weight  Systolic BP 110 --   Diastolic BP 80 --   Wt. (Lbs) 216 218 218.13  BMI 38.26 kg/m2 38.62 kg/m2 38.64 kg/m2    Physical Exam Constitutional:      General: She is not in acute distress.    Appearance: Normal appearance.  HENT:     Head: Normocephalic and atraumatic.     Right Ear: External ear normal.  Pulmonary:     Effort: Pulmonary effort is normal. No respiratory distress.  Neurological:     Mental Status: She is alert.     Lab Results  Component Value Date   WBC 5.7 11/18/2022   HGB 12.9 11/18/2022   HCT 38.5 11/18/2022   PLT 154 11/18/2022   GLUCOSE 132 (H) 11/18/2022   CHOL 173 10/28/2022   TRIG 265 (H) 10/28/2022   HDL 34 (L) 10/28/2022   LDLCALC 86 10/28/2022   ALT 15 11/18/2022   AST 21 11/18/2022   NA 140 11/18/2022   K 3.6 11/18/2022   CL 106 11/18/2022   CREATININE 0.97 11/18/2022   BUN 16 11/18/2022   CO2 27 11/18/2022   TSH 1.540 10/28/2022   INR 1.10 11/10/2014   HGBA1C 5.3 02/24/2021     Assessment & Plan:  Impacted cerumen of right ear Assessment & Plan: Successful lavage today.  Advised over-the-counter Debrox as needed.  Supportive care.     Follow-up:  Return if symptoms worsen or fail to improve.  Jacqulyn Ahle DO [facility]

## 2023-11-06 NOTE — Assessment & Plan Note (Signed)
 Successful lavage today.  Advised over-the-counter Debrox as needed.  Supportive care.

## 2023-11-09 ENCOUNTER — Ambulatory Visit (INDEPENDENT_AMBULATORY_CARE_PROVIDER_SITE_OTHER): Admitting: Gastroenterology

## 2023-11-09 ENCOUNTER — Encounter (INDEPENDENT_AMBULATORY_CARE_PROVIDER_SITE_OTHER): Payer: Self-pay | Admitting: Gastroenterology

## 2023-11-09 VITALS — BP 136/81 | HR 80 | Temp 98.4°F | Ht 63.0 in | Wt 219.1 lb

## 2023-11-09 DIAGNOSIS — K449 Diaphragmatic hernia without obstruction or gangrene: Secondary | ICD-10-CM

## 2023-11-09 DIAGNOSIS — D649 Anemia, unspecified: Secondary | ICD-10-CM

## 2023-11-09 DIAGNOSIS — D500 Iron deficiency anemia secondary to blood loss (chronic): Secondary | ICD-10-CM

## 2023-11-09 DIAGNOSIS — K259 Gastric ulcer, unspecified as acute or chronic, without hemorrhage or perforation: Secondary | ICD-10-CM

## 2023-11-09 DIAGNOSIS — K529 Noninfective gastroenteritis and colitis, unspecified: Secondary | ICD-10-CM

## 2023-11-09 NOTE — Patient Instructions (Signed)
 Continue omeprazole  40 mg daily Please ask weight loss clinic to fax the results of your upcoming lab test (should include CBC and iron  stores)

## 2023-11-09 NOTE — Progress Notes (Signed)
 Toribio Fortune, M.D. Gastroenterology & Hepatology [facility] [address] [place], [place] [address]  Primary Care Physician: Alphonsa Glendia LABOR, MD [address] [place] [address]  I will communicate my assessment and recommendations to the referring MD via EMR.  Problems: Chronic diarrhea due to chemotherapy, resolved Large hiatal hernia with Ole ulcers Iron -deficiency anemia secondary to [employer] ulcers   History of Present Illness: Karen Vazquez is a 68 y.o. female with past medical history anxiety, cancer of the breast status left lumpectomy, postchemotherapy with Paclitaxel  + Trastuzumab , anastrozole  and radiation therapy, restless leg syndrome, GERD, diabetes, who presents for follow-up of hiatal hernia, anemia and diarrhea.  The patient was last seen on 06/01/2023. At that time, the patient was advised to restart omeprazole  40 mg daily given history of iron  deficiency anemia and large hiatal hernia.  As she was presenting diarrhea at that time, she was scheduled for colonoscopy.  No colonic biopsies with findings described below.  She was continued on Lomotil  as needed for diarrhea.  Patient states her diarrhea is better as she is having a BM 1-2 times a day. No urgency. Rarely uses Lomotil . She started Mounjaro a month ago, which initially caused some diarrhea initially but after a month break she did not have any recurrence.   She takes omeprazole  daily compliantly. No heartburn or dysphagia. She is supposed to have iron  levels and CBC checked in a week.  The patient denies having any nausea, vomiting, fever, chills, hematochezia, melena, hematemesis, abdominal distention, abdominal pain, diarrhea, jaundice, pruritus.  Has lost some weight with Mounjaro.  Last EGD: 04/2019 - Normal esophagus. - Z- line regular, 30 cm from the incisors. - 8 cm hiatal hernia. - Erosive gastropathy with no stigmata of recent bleeding. -  Non- bleeding duodenal ulcer with no stigmata of bleeding. - Normal second portion of the duodenum.  Last Colonoscopy: 07/05/23 - The examined portion of the ileum was normal. - The entire examined colon is normal on direct and retroflexion views.  Random biopsies performed Pathology showed normal mucosa. Recommended repeat colonoscopy in 10 years  Past Medical History: Past Medical History:  Diagnosis Date   Allergy 15 years ago   Dry cough   Anemia    Anxiety    Arthritis    Back pain    Blood transfusion 2011   at [facility]   Blood transfusion without reported diagnosis 2011   After knee replacement   Breast cancer (HCC) 09/08/21   Bulging lumbar disc    Cancer (HCC) August 28, 2021   Breast cancer   Cataract 2022   Both taken care of in 2022   Chest pain    Dry mouth    Easy bruising    Excessive thirst    Fatigue    Frequent urination    GERD (gastroesophageal reflux disease)    Headache    Heart murmur 20 years ago   Doctor thinking since birth   Heat intolerance    Hypertension    Hypothyroidism    Joint pain    Leg pain    Muscle stiffness    Nervousness    Palpitations    Personal history of chemotherapy    Personal history of radiation therapy    Pre-diabetes    Restless leg syndrome    Shortness of breath on exertion    Sleep apnea    does not use c-pap machine   Stomach ulcer  Stress    Swelling of both lower extremities    Trouble in sleeping    Vitamin D  deficiency    Weakness     Past Surgical History: Past Surgical History:  Procedure Laterality Date   ABDOMINAL HYSTERECTOMY  25 years ago   Partial hysterectomy. Still have ovaries   APPENDECTOMY     BIOPSY  05/16/2019   Procedure: BIOPSY;  Surgeon: Golda Claudis PENNER, MD;  [facility];  Service: Endoscopy;;  duodenum antral   BLADDER SUSPENSION  12/29/2010   Procedure: TRANSVAGINAL TAPE (TVT) PROCEDURE;  Surgeon: Rosaline DELENA Luna;  [facility]: [facility];  Service: Gynecology;   Laterality: N/A;   BREAST LUMPECTOMY Left 10/26/2021   BREAST LUMPECTOMY WITH RADIOACTIVE SEED AND SENTINEL LYMPH NODE BIOPSY Left 10/26/2021   Procedure: LEFT BREAST SEED LUMPECTOMY WITH LEFT SENTINEL LYMPH NODE MAPPING;  Surgeon: Vanderbilt Ned, MD;  Location: [facility];  Service: General;  Laterality: Left;   BREAST SURGERY     left breast lumpectomy 1977   CATARACT EXTRACTION     COLONOSCOPY N/A 03/03/2016   Procedure: COLONOSCOPY;  Surgeon: Claudis PENNER Golda, MD;  [facility];  Service: Endoscopy;  Laterality: N/A;  2:00 - moved to 12/14 @ 12:55 - Ann notified pt   COLONOSCOPY N/A 07/05/2023   Procedure: COLONOSCOPY;  Surgeon: Eartha Angelia Sieving, MD;  [facility];  Service: Gastroenterology;  Laterality: N/A;  7:30AM;ASA 1   CYSTOCELE REPAIR  12/29/2010   Procedure: ANTERIOR REPAIR (CYSTOCELE);  Surgeon: Rosaline DELENA Luna;  [facility];  Service: Gynecology;  Laterality: N/A;   CYSTOSCOPY  12/29/2010   Procedure: CYSTOSCOPY;  Surgeon: Rosaline DELENA Luna;  [facility];  Service: Gynecology;  Laterality: N/A;   ESOPHAGOGASTRODUODENOSCOPY N/A 05/16/2019   Procedure: ESOPHAGOGASTRODUODENOSCOPY (EGD);  Surgeon: Golda Claudis PENNER, MD;  [facility];  Service: Endoscopy;  Laterality: N/A;   EYE SURGERY  2023   Cataracts   FRACTURE SURGERY  30 years ago   Broken ankle abs then broken left wrist in 2021   JOINT REPLACEMENT  2011   rt knee   PORTACATH PLACEMENT N/A 10/26/2021   Procedure: PORT PLACEMENT WITH ULTRASOUND GUIDANCE;  Surgeon: Vanderbilt Ned, MD;  Location: [facility];  Service: General;  Laterality: N/A;   REPLACEMENT TOTAL KNEE Right 2011   TONSILLECTOMY     TOTAL KNEE ARTHROPLASTY Left 11/17/2014   Procedure: LEFT TOTAL KNEE ARTHROPLASTY;  Surgeon: Dempsey Moan, MD;  Location: [facility];  Service: Orthopedics;  Laterality: Left;   TUBAL LIGATION     VAGINAL HYSTERECTOMY  12/29/2010   Procedure:  HYSTERECTOMY VAGINAL;  Surgeon: Rosaline DELENA Luna;  [facility];  Service: Gynecology;  Laterality: N/A;    Family History: Family History  Problem Relation Age of Onset   Stroke Mother    Hypertension Mother    Hyperlipidemia Mother    Thyroid  disease Mother    Arthritis Mother    Heart disease Father    COPD Father    Depression Father    Prostate cancer Father 59 - 89   Cancer Father    Breast cancer Paternal Grandmother 60 - 5   Cancer Paternal Grandmother    Diabetes Paternal Grandfather    Colon cancer Cousin    Depression Brother    Depression Brother     Social History: Social History   Tobacco Use  Smoking Status Never  Smokeless Tobacco Never   Social History  Substance and Sexual Activity  Alcohol Use No   Social History   Substance and Sexual Activity  Drug Use No    Allergies: Allergies  Allergen Reactions   Lisinopril  Cough    Medications: Current Outpatient Medications  Medication Sig Dispense Refill   alendronate  (FOSAMAX ) 70 MG tablet Take 1 tablet (70 mg total) by mouth every 7 (seven) days. Take with a full glass of water  on an empty stomach. 4 tablet 11   ALPRAZolam  (XANAX ) 0.5 MG tablet TAKE ONE TO TWO TABLETS BY MOUTH TWO TIMES DAILY AS NEEDED FOR ANXIETY OR SLEEP (Patient taking differently: Take 0.5 mg by mouth at bedtime as needed. TAKE ONE TO TWO TABLETS BY MOUTH TWO TIMES DAILY AS NEEDED FOR ANXIETY OR SLEEP) 30 tablet 5   amLODipine  (NORVASC ) 2.5 MG tablet TAKE ONE TABLET (2.5MG  TOTAL) BY MOUTH DAILY 30 tablet 5   anastrozole  (ARIMIDEX ) 1 MG tablet TAKE ONE TABLET (1MG  TOTAL) BY MOUTH DAILY 90 tablet 3   ARMOUR THYROID  15 MG tablet TAKE ONE TABLET ONCE DAILY 30 tablet 5   diphenoxylate -atropine  (LOMOTIL ) 2.5-0.025 MG tablet Take 1 tablet by mouth 4 (four) times daily as needed for diarrhea or loose stools. 30 tablet 3   fluticasone  (FLONASE ) 50 MCG/ACT nasal spray USE 2 SPRAYS IN EACH NOSTRIL DAILY (Patient taking  differently: Place 2 sprays into both nostrils as needed.) 16 g 1   Multiple Vitamins-Minerals (PRESERVISION AREDS PO) Take by mouth 2 (two) times daily.     neomycin -polymyxin-hydrocortisone (CORTISPORIN) 3.5-10000-1 OTIC suspension Place 4 drops into the right ear 4 (four) times daily. 10 mL 0   omeprazole  (PRILOSEC) 40 MG capsule Take 1 capsule (40 mg total) by mouth daily. 90 capsule 3   oxybutynin  (DITROPAN -XL) 10 MG 24 hr tablet Take 10 mg by mouth daily at 12 noon.     Pediatric Vitamins (MULTIVITAMIN GUMMIES CHILDRENS) CHEW FLINSTONES W/ IRON      pregabalin  (LYRICA ) 50 MG capsule TAKE ONE CAPSULE BY MOUTH EVERY EVENING 30 capsule 5   thyroid  (ARMOUR THYROID ) 30 MG tablet TAKE ONE (1) TABLET BY MOUTH EVERY DAY 30 tablet 0   thyroid  (ARMOUR) 30 MG tablet Take 30 mg by mouth daily before breakfast.     tirzepatide (MOUNJARO) 2.5 MG/0.5ML Pen 2.5 mg Subcutaneous weekly for 30 days     No current facility-administered medications for this visit.    Review of Systems: GENERAL: negative for malaise, night sweats HEENT: No changes in hearing or vision, no nose bleeds or other nasal problems. NECK: Negative for lumps, goiter, pain and significant neck swelling RESPIRATORY: Negative for cough, wheezing CARDIOVASCULAR: Negative for chest pain, leg swelling, palpitations, orthopnea GI: SEE HPI MUSCULOSKELETAL: Negative for joint pain or swelling, back pain, and muscle pain. SKIN: Negative for lesions, rash PSYCH: Negative for sleep disturbance, mood disorder and recent psychosocial stressors. HEMATOLOGY Negative for prolonged bleeding, bruising easily, and swollen nodes. ENDOCRINE: Negative for cold or heat intolerance, polyuria, polydipsia and goiter. NEURO: negative for tremor, gait imbalance, syncope and seizures. The remainder of the review of systems is noncontributory.   Physical Exam: BP 136/81 (BP Location: Left Arm, Patient Position: Sitting, Cuff Size: Large)   Pulse 80   Temp  98.4 F (36.9 C) (Temporal)   Ht 5' 3 (1.6 m)   Wt 219 lb 1.6 oz (99.4 kg)   LMP 05/24/2010   BMI 38.81 kg/m  GENERAL: The patient is AO x3, in no acute distress. HEENT: Head is normocephalic and atraumatic. EOMI are  intact. Mouth is well hydrated and without lesions. NECK: Supple. No masses LUNGS: Clear to auscultation. No presence of rhonchi/wheezing/rales. Adequate chest expansion HEART: RRR, normal s1 and s2. ABDOMEN: Soft, nontender, no guarding, no peritoneal signs, and nondistended. BS +. No masses. EXTREMITIES: Without any cyanosis, clubbing, rash, lesions or edema. NEUROLOGIC: AOx3, no focal motor deficit. SKIN: no jaundice, no rashes  Imaging/Labs: as above  I personally reviewed and interpreted the available labs, imaging and endoscopic files.  Impression and Plan: Karen Vazquez is a Karen Vazquez is a 68 y.o. female with past medical history anxiety, cancer of the breast status left lumpectomy, postchemotherapy with Paclitaxel  + Trastuzumab , anastrozole  and radiation therapy, restless leg syndrome, GERD, diabetes, who presents for follow-up of hiatal hernia, anemia and diarrhea.  Patient has been taking PPI on a daily basis recommending her last appointment to prevent or chronic losses from [facility] erosions.  Will continue this medication for now but I will request the most recent labs she will have performed to evaluate if her iron  deficiency anemia has improved with moderate dose PPI daily.  Notably, her recurrent diarrhea has resolved, likely related to her chemotherapy regimen.  Will continue monitoring for now.  -Continue omeprazole  40 mg daily - Patient will ask weight loss clinic to fax the results of upcoming lab test (should include CBC and iron  stores)  All questions were answered.      Toribio Fortune, MD Gastroenterology and Hepatology [facility] Gastroenterology

## 2023-11-16 ENCOUNTER — Other Ambulatory Visit: Payer: Self-pay | Admitting: Family Medicine

## 2023-11-27 ENCOUNTER — Ambulatory Visit: Attending: Surgery

## 2023-11-27 VITALS — Wt 212.5 lb

## 2023-11-27 DIAGNOSIS — Z483 Aftercare following surgery for neoplasm: Secondary | ICD-10-CM | POA: Insufficient documentation

## 2023-11-27 NOTE — Therapy (Signed)
 OUTPATIENT PHYSICAL THERAPY SOZO SCREENING NOTE   Patient Name: Karen Vazquez MRN: [id number] DOB:12-03-1955, 68 y.o., female Today's Date: 11/27/2023  PCP: Alphonsa Glendia LABOR, MD REFERRING PROVIDER: Vanderbilt Ned, MD   PT End of Session - 11/27/23 1537     Visit Number 2   # unchanged due to screen only   PT Start Time 1536    PT Stop Time 1539    PT Time Calculation (min) 3 min    Activity Tolerance Patient tolerated treatment well    Behavior During Therapy [facility] for tasks assessed/performed           Past Medical History:  Diagnosis Date   Allergy 15 years ago   Dry cough   Anemia    Anxiety    Arthritis    Back pain    Blood transfusion 2011   at [facility]   Blood transfusion without reported diagnosis 2011   After knee replacement   Breast cancer (HCC) 09/08/21   Bulging lumbar disc    Cancer (HCC) August 28, 2021   Breast cancer   Cataract 2022   Both taken care of in 2022   Chest pain    Dry mouth    Easy bruising    Excessive thirst    Fatigue    Frequent urination    GERD (gastroesophageal reflux disease)    Headache    Heart murmur 20 years ago   Doctor thinking since birth   Heat intolerance    Hypertension    Hypothyroidism    Joint pain    Leg pain    Muscle stiffness    Nervousness    Palpitations    Personal history of chemotherapy    Personal history of radiation therapy    Pre-diabetes    Restless leg syndrome    Shortness of breath on exertion    Sleep apnea    does not use c-pap machine   Stomach ulcer    Stress    Swelling of both lower extremities    Trouble in sleeping    Vitamin D  deficiency    Weakness    Past Surgical History:  Procedure Laterality Date   ABDOMINAL HYSTERECTOMY  25 years ago   Partial hysterectomy. Still have ovaries   APPENDECTOMY     BIOPSY  05/16/2019   Procedure: BIOPSY;  Surgeon: Golda Claudis PENNER, MD;  [facility];  Service: Endoscopy;;  duodenum antral   BLADDER SUSPENSION  12/29/2010    Procedure: TRANSVAGINAL TAPE (TVT) PROCEDURE;  Surgeon: Rosaline LABOR Luna;  [facility];  Service: Gynecology;  Laterality: N/A;   BREAST LUMPECTOMY Left 10/26/2021   BREAST LUMPECTOMY WITH RADIOACTIVE SEED AND SENTINEL LYMPH NODE BIOPSY Left 10/26/2021   Procedure: LEFT BREAST SEED LUMPECTOMY WITH LEFT SENTINEL LYMPH NODE MAPPING;  Surgeon: Vanderbilt Ned, MD;  Location: [facility];  Service: General;  Laterality: Left;   BREAST SURGERY     left breast lumpectomy 1977   CATARACT EXTRACTION     COLONOSCOPY N/A 03/03/2016   Procedure: COLONOSCOPY;  Surgeon: Claudis PENNER Golda, MD;  [facility];  Service: Endoscopy;  Laterality: N/A;  2:00 - moved to 12/14 @ 12:55 - Ann notified pt   COLONOSCOPY N/A 07/05/2023   Procedure: COLONOSCOPY;  Surgeon: Eartha Angelia Sieving, MD;  [facility];  Service: Gastroenterology;  Laterality: N/A;  7:30AM;ASA 1   CYSTOCELE REPAIR  12/29/2010   Procedure: ANTERIOR REPAIR (CYSTOCELE);  Surgeon: Rosaline LABOR  Sarrah;  [facility];  Service: Gynecology;  Laterality: N/A;   CYSTOSCOPY  12/29/2010   Procedure: CYSTOSCOPY;  Surgeon: Rosaline DELENA Sarrah;  [facility];  Service: Gynecology;  Laterality: N/A;   ESOPHAGOGASTRODUODENOSCOPY N/A 05/16/2019   Procedure: ESOPHAGOGASTRODUODENOSCOPY (EGD);  Surgeon: Golda Claudis PENNER, MD;  [facility];  Service: Endoscopy;  Laterality: N/A;   EYE SURGERY  2023   Cataracts   FRACTURE SURGERY  30 years ago   Broken ankle abs then broken left wrist in 2021   JOINT REPLACEMENT  2011   rt knee   PORTACATH PLACEMENT N/A 10/26/2021   Procedure: PORT PLACEMENT WITH ULTRASOUND GUIDANCE;  Surgeon: Vanderbilt Ned, MD;  Location: [facility];  Service: General;  Laterality: N/A;   REPLACEMENT TOTAL KNEE Right 2011   TONSILLECTOMY     TOTAL KNEE ARTHROPLASTY Left 11/17/2014   Procedure: LEFT TOTAL KNEE ARTHROPLASTY;  Surgeon: Dempsey Moan, MD;  Location:  [facility];  Service: Orthopedics;  Laterality: Left;   TUBAL LIGATION     VAGINAL HYSTERECTOMY  12/29/2010   Procedure: HYSTERECTOMY VAGINAL;  Surgeon: Rosaline DELENA Sarrah;  [facility];  Service: Gynecology;  Laterality: N/A;   Patient Active Problem List   Diagnosis Date Noted   Cerumen impaction 11/06/2023   Large hiatal hernia 06/01/2023   Cameron ulcer 06/01/2023   Chronic diarrhea 06/01/2023   Port-A-Cath in place 11/25/2021   Genetic testing 09/20/2021   Family history of prostate cancer in father 09/15/2021   Family history of breast cancer 09/15/2021   Malignant neoplasm of upper-outer quadrant of left breast in female, estrogen receptor positive (HCC) 09/09/2021   Eating disorder 04/01/2021   History of iron  deficiency anemia 10/27/2020   Cataract, nuclear sclerotic, both eyes 01/10/2020   History of total knee replacement, right 06/08/2017   History of colonic polyps 04/26/2016   OA (osteoarthritis) of knee 11/17/2014   Essential hypertension 05/28/2014   Obstructive sleep apnea 05/28/2014   Anemia, iron  deficiency 12/16/2013   Prediabetes 05/01/2013   GERD (gastroesophageal reflux disease) 02/06/2013   Hypothyroidism 12/12/2012   Insomnia 12/12/2012   Hyperlipidemia 12/12/2012   Osteoarthritis 12/12/2012   Restless legs syndrome 12/12/2012   S/P LASIK surgery of both eyes 03/21/1998    REFERRING DIAG: left breast cancer at risk for lymphedema  THERAPY DIAG: Aftercare following surgery for neoplasm  PERTINENT HISTORY: Patient was diagnosed on 08/17/2021 with left grade 2 invasive ductal carcinoma breast cancer. It is ER positive, PR negative, and HER2 positive with a Ki67 of 55%. She underwent a left lumpectomy and sentinel node biopsy (3 negative nodes) on 10/26/2021. She had a benign left breast lump removed in 1977, has anemia, hypertension, and has had both knee replaced in 2011 and 2016 (unknown when right versus left were done).   PRECAUTIONS: left UE  Lymphedema risk,   SUBJECTIVE: Pt is here for last 3 month SOZO screening.  PAIN:  Are you having pain? No  SOZO SCREENING: Patient was assessed today using the SOZO machine to determine the lymphedema index score. This was compared to her baseline score. It was determined that she is within the recommended range when compared to her baseline and no further action is needed at this time. She will continue SOZO screenings. These are done every 3 months for 2 years post operatively followed by every 6 months for 2 years, and then annually.   L-DEX FLOWSHEETS - 11/27/23 1500       L-DEX LYMPHEDEMA SCREENING  Measurement Type Unilateral    L-DEX MEASUREMENT EXTREMITY Upper Extremity    POSITION  Standing    DOMINANT SIDE Right    At Risk Side Left    BASELINE SCORE (UNILATERAL) 2.2    L-DEX SCORE (UNILATERAL) 3.8    VALUE CHANGE (UNILAT) 1.6          P: Transition to 6 month SOZO    Aden Berwyn Caldron, PTA 11/27/2023, 3:38 PM

## 2023-12-07 ENCOUNTER — Other Ambulatory Visit: Payer: Self-pay | Admitting: Family Medicine

## 2023-12-16 ENCOUNTER — Other Ambulatory Visit: Payer: Self-pay | Admitting: Family Medicine

## 2023-12-18 ENCOUNTER — Encounter: Payer: Self-pay | Admitting: Family Medicine

## 2024-01-03 ENCOUNTER — Encounter (INDEPENDENT_AMBULATORY_CARE_PROVIDER_SITE_OTHER): Payer: Self-pay | Admitting: Gastroenterology

## 2024-01-09 LAB — SIGNATERA
SIGNATERA MTM READOUT: 0 MTM/ml
SIGNATERA TEST RESULT: NEGATIVE

## 2024-01-18 ENCOUNTER — Other Ambulatory Visit: Payer: Self-pay | Admitting: Family Medicine

## 2024-01-29 ENCOUNTER — Ambulatory Visit: Payer: Self-pay | Admitting: Nurse Practitioner

## 2024-01-29 ENCOUNTER — Encounter: Payer: Self-pay | Admitting: Nurse Practitioner

## 2024-01-29 VITALS — BP 146/85 | HR 74 | Temp 96.5°F | Ht 63.0 in | Wt 216.0 lb

## 2024-01-29 DIAGNOSIS — N3945 Continuous leakage: Secondary | ICD-10-CM

## 2024-01-29 DIAGNOSIS — R300 Dysuria: Secondary | ICD-10-CM | POA: Diagnosis not present

## 2024-01-29 DIAGNOSIS — K529 Noninfective gastroenteritis and colitis, unspecified: Secondary | ICD-10-CM

## 2024-01-29 DIAGNOSIS — Z23 Encounter for immunization: Secondary | ICD-10-CM | POA: Diagnosis not present

## 2024-01-29 DIAGNOSIS — I10 Essential (primary) hypertension: Secondary | ICD-10-CM

## 2024-01-29 DIAGNOSIS — F5104 Psychophysiologic insomnia: Secondary | ICD-10-CM | POA: Diagnosis not present

## 2024-01-29 MED ORDER — DIPHENOXYLATE-ATROPINE 2.5-0.025 MG PO TABS: 1.0000 | ORAL_TABLET | Freq: Four times a day (QID) | ORAL | 2 refills | Status: AC | PRN

## 2024-01-29 MED ORDER — ALPRAZOLAM 1 MG PO TABS: 1.0000 mg | ORAL_TABLET | Freq: Every day | ORAL | 0 refills | Status: AC

## 2024-01-29 NOTE — Progress Notes (Signed)
 Subjective:    Patient ID: Karen Vazquez Husband, female    DOB: Dec 20, 1955, 68 y.o.   MRN: [id number]  HPI Nursing notes: Discuss sleep problem - trouble staying asleep at night  Was taken off ambien  by pcp due to potential side effects  Has been taking xanax  which has not helped  Discussed the use of AI scribe software for clinical note transcription with the patient, who gave verbal consent to proceed.  History of Present Illness [name] is a 68 year old female who presents with chronic diarrhea and sleep disturbances.  She experiences chronic diarrhea, which she suspects is related to her previous cancer treatments, including chemotherapy and immunotherapy. The diarrhea is explosive and triggered by certain foods. She manages it by avoiding these foods and using Lomotil , as Imodium was ineffective. She also takes a probiotic, Culturelle, and uses a cream recommended by her sister-in-law, a nurse, to manage skin inflammation after episodes.  She has significant sleep disturbances since discontinuing Ambien , which she had been using for many years. Currently, she takes 0.5 mg of Xanax , which helps her fall asleep but does not maintain sleep throughout the night. She typically sleeps for a couple of hours before waking and has difficulty returning to sleep. 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  GERD well controlled on Omeprazole  through GI specialist.   She has bladder issues, including frequent urination and the use of oxybutynin , which she finds ineffective. She has been using adult diapers due to incontinence since her cancer treatment. Has also had bladder tacking which only worked for a short time. No dysuria but requests a urine check today due to frequency. Sees GYN for wellness exams. Gets labs done through a service that comes to her home. Most recent labs unavailable during visit.   No  chest pain, shortness of breath, coughing, or wheezing. She had a flu shot last fall during her immunotherapy treatment. Needs vaccine today.    Review of Systems  Constitutional:  Positive for fatigue.  Respiratory:  Negative for cough, chest tightness and shortness of breath.   Cardiovascular:  Negative for chest pain and leg swelling.  Gastrointestinal:  Positive for diarrhea. Negative for abdominal pain, blood in stool and constipation.  Genitourinary:  Positive for dysuria, enuresis, frequency and urgency.       Objective:   Physical Exam Vitals and nursing note reviewed.  Constitutional:      General: She is not in acute distress. Cardiovascular:     Rate and Rhythm: Normal rate and regular rhythm.     Heart sounds: Normal heart sounds.  Pulmonary:     Effort: Pulmonary effort is normal.     Breath sounds: Normal breath sounds.  Musculoskeletal:     Right lower leg: No edema.     Left lower leg: No edema.  Neurological:     Mental Status: She is alert and oriented to person, place, and time.  Psychiatric:        Mood and Affect: Mood normal.        Behavior: Behavior normal.        Thought Content: Thought content normal.        Judgment: Judgment normal.    Today's Vitals   01/29/24 0931  BP: (!) 146/85  Pulse: 74  Temp: (!) 96.5 F (35.8 C)  SpO2: 96%  Weight: 216 lb (98  kg)  Height: 5' 3 (1.6 m)   Body mass index is 38.26 kg/m.    UA: negative except for 1+leukocytes     Assessment & Plan:  1. Dysuria - Urinalysis - Urine Culture  2. Chronic diarrhea Chronic diarrhea Likely secondary to previous chemotherapy or immunotherapy. Lomotil  effective for symptom management. - Refilled Lomotil  prescription. - Recommended switching to Floragen probiotic, available in the refrigerator at the pharmacy. - Continue using cream for skin irritation. - diphenoxylate -atropine  (LOMOTIL ) 2.5-0.025 MG tablet; Take 1 tablet by mouth 4 (four) times daily as needed  for diarrhea or loose stools.  Dispense: 60 tablet; Refill: 2  3. Psychophysiological insomnia (Primary) Chronic insomnia with difficulty maintaining sleep. Xanax  0.5 mg helps initiate sleep but not maintain it. Discussed increasing Xanax  to 1 mg to improve sleep duration. - Increased Xanax  dose to 1 mg. - Advised to monitor sleep patterns and consider reducing afternoon naps. - ALPRAZolam  (XANAX ) 1 MG tablet; Take 1 tablet (1 mg total) by mouth at bedtime. As needed for sleep  Dispense: 30 tablet; Refill: 0  4. Continuous leakage of urine Chronic urinary incontinence with frequent urination. Previous bladder sling surgery unsuccessful. Oxybutynin  10 mg ineffective. - Referred to urogynecology for further evaluation and management. - Urinalysis - Urine Culture - Ambulatory referral to Urogynecology  5. Immunization due  - Flu vaccine trivalent PF, 6mos and older(Flulaval,Afluria,Fluarix,Fluzone)  6. Essential hypertension Continue Amlodipine  as directed.   Return in about 6 months (around 07/28/2024).

## 2024-01-29 NOTE — Patient Instructions (Addendum)
 Luvena Replens Florajen bowel probiotic

## 2024-01-31 ENCOUNTER — Other Ambulatory Visit: Payer: Self-pay | Admitting: Nurse Practitioner

## 2024-01-31 ENCOUNTER — Ambulatory Visit: Payer: Self-pay | Admitting: Nurse Practitioner

## 2024-01-31 LAB — URINALYSIS
Bilirubin, UA: NEGATIVE
Glucose, UA: NEGATIVE
Ketones, UA: NEGATIVE
Nitrite, UA: NEGATIVE
Protein,UA: NEGATIVE
RBC, UA: NEGATIVE
Specific Gravity, UA: 1.015 (ref 1.005–1.030)
Urobilinogen, Ur: 0.2 mg/dL (ref 0.2–1.0)
pH, UA: 7 (ref 5.0–7.5)

## 2024-01-31 MED ORDER — NITROFURANTOIN MONOHYD MACRO 100 MG PO CAPS: 100.0000 mg | ORAL_CAPSULE | Freq: Two times a day (BID) | ORAL | 0 refills | Status: DC

## 2024-02-02 LAB — URINE CULTURE

## 2024-02-13 ENCOUNTER — Encounter: Payer: Self-pay | Admitting: *Deleted

## 2024-02-13 ENCOUNTER — Encounter: Payer: Self-pay | Admitting: Hematology and Oncology

## 2024-02-13 DIAGNOSIS — C50412 Malignant neoplasm of upper-outer quadrant of left female breast: Secondary | ICD-10-CM

## 2024-02-13 DIAGNOSIS — R053 Chronic cough: Secondary | ICD-10-CM

## 2024-02-13 NOTE — Progress Notes (Signed)
 Received mychart message from pt with complaint of ongoing dry cough x2 years.  Pt states she is concerned for metastatic disease and requesting PET scan.  RN reviewed with MD who states CT chest would need to be ordered first.  Orders placed, pt notified and verbalized understanding.

## 2024-02-16 ENCOUNTER — Ambulatory Visit: Payer: Self-pay | Admitting: Family Medicine

## 2024-02-21 ENCOUNTER — Other Ambulatory Visit: Payer: Self-pay | Admitting: Family Medicine

## 2024-02-22 ENCOUNTER — Other Ambulatory Visit: Payer: Self-pay | Admitting: Nurse Practitioner

## 2024-02-22 ENCOUNTER — Other Ambulatory Visit: Payer: Self-pay

## 2024-02-22 MED ORDER — PREGABALIN 50 MG PO CAPS: 50.0000 mg | ORAL_CAPSULE | Freq: Every evening | ORAL | 0 refills | Status: AC

## 2024-03-06 ENCOUNTER — Ambulatory Visit (HOSPITAL_COMMUNITY): Admission: RE | Admit: 2024-03-06 | Discharge: 2024-03-06 | Attending: Hematology and Oncology

## 2024-03-06 DIAGNOSIS — Z170 Estrogen receptor positive status [ER+]: Secondary | ICD-10-CM | POA: Diagnosis present

## 2024-03-06 DIAGNOSIS — C50412 Malignant neoplasm of upper-outer quadrant of left female breast: Secondary | ICD-10-CM | POA: Diagnosis present

## 2024-03-06 DIAGNOSIS — R053 Chronic cough: Secondary | ICD-10-CM | POA: Insufficient documentation

## 2024-03-06 MED ORDER — IOHEXOL 300 MG/ML  SOLN
75.0000 mL | Freq: Once | INTRAMUSCULAR | Status: AC | PRN
  Administered 2024-03-06: 12:00:00 75 mL via INTRAVENOUS

## 2024-03-13 ENCOUNTER — Inpatient Hospital Stay: Attending: Hematology and Oncology | Admitting: Hematology and Oncology

## 2024-03-13 VITALS — BP 142/81 | HR 81 | Temp 98.0°F | Resp 18 | Ht 63.0 in | Wt 214.9 lb

## 2024-03-13 DIAGNOSIS — Z79811 Long term (current) use of aromatase inhibitors: Secondary | ICD-10-CM | POA: Insufficient documentation

## 2024-03-13 DIAGNOSIS — Z170 Estrogen receptor positive status [ER+]: Secondary | ICD-10-CM | POA: Diagnosis not present

## 2024-03-13 DIAGNOSIS — G4733 Obstructive sleep apnea (adult) (pediatric): Secondary | ICD-10-CM | POA: Insufficient documentation

## 2024-03-13 DIAGNOSIS — M8580 Other specified disorders of bone density and structure, unspecified site: Secondary | ICD-10-CM | POA: Insufficient documentation

## 2024-03-13 DIAGNOSIS — D509 Iron deficiency anemia, unspecified: Secondary | ICD-10-CM | POA: Diagnosis not present

## 2024-03-13 DIAGNOSIS — I272 Pulmonary hypertension, unspecified: Secondary | ICD-10-CM | POA: Insufficient documentation

## 2024-03-13 DIAGNOSIS — Z9221 Personal history of antineoplastic chemotherapy: Secondary | ICD-10-CM | POA: Diagnosis not present

## 2024-03-13 DIAGNOSIS — C50412 Malignant neoplasm of upper-outer quadrant of left female breast: Secondary | ICD-10-CM | POA: Insufficient documentation

## 2024-03-13 DIAGNOSIS — Z923 Personal history of irradiation: Secondary | ICD-10-CM | POA: Diagnosis not present

## 2024-03-13 DIAGNOSIS — Z1722 Progesterone receptor negative status: Secondary | ICD-10-CM | POA: Diagnosis not present

## 2024-03-13 DIAGNOSIS — Z1731 Human epidermal growth factor receptor 2 positive status: Secondary | ICD-10-CM | POA: Insufficient documentation

## 2024-03-13 NOTE — Assessment & Plan Note (Signed)
 09/07/2021:Screening mammogram detected left breast mass by ultrasound 2 o'clock position measured 1.4 cm axilla was negative.  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 adjuvant chemotherapy with Taxol  Herceptin  weekly x12 followed by Herceptin  maintenance every 3 weeks for 1 year completed 11/18/2022 2.  Adjuvant radiation therapy until 04/13/2022 3.  Adjuvant antiestrogen therapy started February 2025 ----------------------------------------------------------------------------------------------------------------------------- Current treatment: Anastrozole    Anastrozole  toxicities: Tolerating it well without any major problems   Microcytic anemia: Improved with iron  infusions.     Breast cancer surveillance: Breast exam 03/13/2024: Benign Mammogram 10/03/2023: Benign density category B MRD testing with Signatera: Negative CT chest 03/10/2024: Benign Return to clinic in 1 year for follow-up

## 2024-03-13 NOTE — Progress Notes (Signed)
 "  Patient Care Team: Alphonsa Glendia LABOR, MD as PCP - General (Family Medicine) Vanderbilt Ned, MD as Consulting Physician (General Surgery) Odean Potts, MD as Consulting Physician (Hematology and Oncology) Izell Domino, MD as Attending Physician (Radiation Oncology) Caresse Cough, MD as Referring Physician (Ophthalmology)  DIAGNOSIS:  Encounter Diagnosis  Name Primary?   Malignant neoplasm of upper-outer quadrant of left breast in female, estrogen receptor positive (HCC) Yes    SUMMARY OF ONCOLOGIC HISTORY: Oncology History  Malignant neoplasm of upper-outer quadrant of left breast in female, estrogen receptor positive (HCC)  09/07/2021 Initial Diagnosis   Screening mammogram detected left breast mass by ultrasound 2 o'clock position measured 1.4 cm axilla was negative.  Ultrasound-guided biopsy revealed grade 2 IDC with focal necrosis ER 90% weak, PR 0%, Ki-67 55%, HER2 3+ positive   09/15/2021 Cancer Staging   Staging form: Breast, AJCC 8th Edition - Clinical: Stage IA (cT1b, cN0, cM0, G3, ER-, PR-, HER2+) - Signed by Izell Domino, MD on 03/22/2022 Stage prefix: Initial diagnosis Histologic grading system: 3 grade system    Genetic Testing   [facility] CustomNext Panel was Negative. Report date is 10/04/2021.  The CustomNext-Cancer+RNAinsight panel offered by [facility] [facility] includes sequencing and rearrangement analysis for the following 47 genes:  APC, ATM, AXIN2, BARD1, BMPR1A, BRCA1, BRCA2, BRIP1, CDH1, CDK4, CDKN2A, CHEK2, CTNNA1, DICER1, EPCAM, GREM1, HOXB13, KIT, MEN1, MLH1, MSH2, MSH3, MSH6, MUTYH, NBN, NF1, NTHL1, PALB2, PDGFRA, PMS2, POLD1, POLE, PTEN, RAD50, RAD51C, RAD51D, SDHA, SDHB, SDHC, SDHD, SMAD4, SMARCA4, STK11, TP53, TSC1, TSC2, and VHL.  RNA data is routinely analyzed for use in variant interpretation for all genes.   10/26/2021 Surgery   Left lumpectomy: Grade 3 IDC 1.9 cm with high-grade DCIS, margins negative, 0/3 lymph nodes negative, ER 90%, PR 0%, HER2 3+  positive, Ki-67 55%    10/26/2021 Cancer Staging   Staging form: Breast, AJCC 8th Edition - Pathologic stage from 10/26/2021: Stage IA (pT1c, pN0, cM0, G3, ER+, PR-, HER2+) - Signed by Crawford Morna Pickle, NP on 05/12/2022 Stage prefix: Initial diagnosis Histologic grading system: 3 grade system   11/25/2021 -  Chemotherapy   Patient is on Treatment Plan : BREAST Paclitaxel  + Trastuzumab  q7d / Trastuzumab  q21d     03/16/2022 - 04/13/2022 Radiation Therapy   40.05 Gy in 15 treatments Boost: 10 Gy in 5 treatments   04/2022 -  Anti-estrogen oral therapy   Anastrozole  x 7 years     CHIEF COMPLIANT: Follow-up after recent CT scans and on anastrozole  therapy  HISTORY OF PRESENT ILLNESS:    History of Present Illness Karen Vazquez is a 68 year old female with estrogen receptor-positive, HER2-positive, stage IA left breast cancer presenting for routine annual oncology follow-up.  Recent breast exam, mammogram in July 2025, and CT chest showed only benign findings including radiation scar tissue and a small liver cyst. She has no breast pain or new focal symptoms and has tolerated anastrozole  since February 2025 without musculoskeletal or vasomotor side effects.  She has persistent fatigue that she attributes to deconditioning and has intentionally lost about four pounds over two months with dietary changes. 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  She has intermittent diarrhea about once weekly, often after red meat or dairy, and does not associate it with anastrozole  since symptoms are infrequent.  She has a longstanding chronic cough that she associates with hiatal hernia and reflux without recent change. CT  imaging showed no concerning pulmonary findings. She also has pulmonary hypertension and obstructive sleep apnea and is not adherent with CPAP due to intolerance.  She has chronic  insomnia and poor sleep quality. She stopped prior sedative-hypnotics due to concern for memory effects and now uses alprazolam  as needed with occasional melatonin while seeking non-pharmacologic sleep strategies.  She is aware of osteopenia with T-score -1.8 from February 2025 and has weekly alendronate  prescribed but admits inconsistent adherence.       ALLERGIES:  is allergic to lisinopril .  MEDICATIONS:  Current Outpatient Medications  Medication Sig Dispense Refill   alendronate  (FOSAMAX ) 70 MG tablet Take 1 tablet (70 mg total) by mouth every 7 (seven) days. Take with a full glass of water  on an empty stomach. 4 tablet 11   ALPRAZolam  (XANAX ) 1 MG tablet Take 1 tablet (1 mg total) by mouth at bedtime. As needed for sleep 30 tablet 0   amLODipine  (NORVASC ) 2.5 MG tablet TAKE ONE TABLET (2.5MG  TOTAL) BY MOUTH DAILY 30 tablet 5   anastrozole  (ARIMIDEX ) 1 MG tablet TAKE ONE TABLET (1MG  TOTAL) BY MOUTH DAILY 90 tablet 3   diphenoxylate -atropine  (LOMOTIL ) 2.5-0.025 MG tablet Take 1 tablet by mouth 4 (four) times daily as needed for diarrhea or loose stools. 60 tablet 2   fluticasone  (FLONASE ) 50 MCG/ACT nasal spray USE 2 SPRAYS IN EACH NOSTRIL DAILY (Patient taking differently: Place 2 sprays into both nostrils as needed.) 16 g 1   omeprazole  (PRILOSEC) 40 MG capsule Take 1 capsule (40 mg total) by mouth daily. 90 capsule 3   oxybutynin  (DITROPAN -XL) 10 MG 24 hr tablet Take 10 mg by mouth daily at 12 noon.     Pediatric Vitamins (MULTIVITAMIN GUMMIES CHILDRENS) CHEW FLINSTONES W/ IRON      pregabalin  (LYRICA ) 50 MG capsule Take 1 capsule (50 mg total) by mouth every evening. 90 capsule 0   thyroid  (ARMOUR THYROID ) 15 MG tablet Take 15 mg by mouth daily.     thyroid  (ARMOUR) 30 MG tablet Take 30 mg by mouth daily before breakfast.     No current facility-administered medications for this visit.    PHYSICAL EXAMINATION: ECOG PERFORMANCE STATUS: 1 - Symptomatic but completely  ambulatory  Vitals:   03/13/24 0948  BP: (!) 142/81  Pulse: 81  Resp: 18  Temp: 98 F (36.7 C)  SpO2: 98%   Filed Weights   03/13/24 0948  Weight: 214 lb 14.4 oz (97.5 kg)      LABORATORY DATA:  I have reviewed the data as listed    Latest Ref Rng & Units 11/18/2022   12:36 PM 10/28/2022    2:36 PM 10/05/2022   10:34 AM  CMP  Glucose 70 - 99 mg/dL 867  90  83   BUN 8 - 23 mg/dL 16  10  16    Creatinine 0.44 - 1.00 mg/dL 9.02  8.92  9.18   Sodium 135 - 145 mmol/L 140  142  141   Potassium 3.5 - 5.1 mmol/L 3.6  3.9  4.0   Chloride 98 - 111 mmol/L 106  108  107   CO2 22 - 32 mmol/L 27  28  28    Calcium 8.9 - 10.3 mg/dL 9.3  9.0  9.7   Total Protein 6.5 - 8.1 g/dL 6.6  6.4  6.6   Total Bilirubin 0.3 - 1.2 mg/dL 0.8  0.4  0.6   Alkaline Phos 38 - 126 U/L 105  107  108   AST 15 -  41 U/L 21  16  16    ALT 0 - 44 U/L 15  11  12      Lab Results  Component Value Date   WBC 5.7 11/18/2022   HGB 12.9 11/18/2022   HCT 38.5 11/18/2022   MCV 85.2 11/18/2022   PLT 154 11/18/2022   NEUTROABS 3.4 11/18/2022    ASSESSMENT & PLAN:  Malignant neoplasm of upper-outer quadrant of left breast in female, estrogen receptor positive (HCC) 09/07/2021:Screening mammogram detected left breast mass by ultrasound 2 o'clock position measured 1.4 cm axilla was negative.  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 adjuvant chemotherapy with Taxol  Herceptin  weekly x12 followed by Herceptin  maintenance every 3 weeks for 1 year completed 11/18/2022 2.  Adjuvant radiation therapy until 04/13/2022 3.  Adjuvant antiestrogen therapy started February  2025 ----------------------------------------------------------------------------------------------------------------------------- Current treatment: Anastrozole    Anastrozole  toxicities: Tolerating it well without any major problems   Microcytic anemia: Improved with iron  infusions.     Breast cancer surveillance: Breast exam 03/13/2024: Benign Mammogram 10/03/2023: Benign density category B MRD testing with Signatera 01/09/2024: Negative CT chest 03/10/2024: Benign  Survivorship: I encouraged her to stay active and attend the gym and workout 5 days a week. Osteopenia: She was started on Fosamax .  She forgets to take it periodically. Obstructive sleep apnea: Encouraged her to use her CPAP machine.  This could be the cause of her pulmonary hypertension on the CT scan.  Return to clinic in 1 year for follow-up       No orders of the defined types were placed in this encounter.  The patient has a good understanding of the overall plan. she agrees with it. she will call with any problems that may develop before the next visit here.  I personally spent a total of 30 minutes in the care of the patient today including preparing to see the patient, getting/reviewing separately obtained history, performing a medically appropriate exam/evaluation, counseling and educating, placing orders, referring and communicating with other health care professionals, documenting clinical information in the EHR, independently interpreting results, communicating results, and coordinating care.   Viinay K Shahir Karen, MD 03/13/2024    "

## 2024-03-14 ENCOUNTER — Other Ambulatory Visit: Payer: Self-pay

## 2024-04-08 ENCOUNTER — Ambulatory Visit: Admitting: Urology

## 2024-04-08 ENCOUNTER — Encounter: Payer: Self-pay | Admitting: Urology

## 2024-04-08 VITALS — BP 134/77 | HR 77

## 2024-04-08 DIAGNOSIS — R3915 Urgency of urination: Secondary | ICD-10-CM | POA: Diagnosis not present

## 2024-04-08 DIAGNOSIS — R350 Frequency of micturition: Secondary | ICD-10-CM

## 2024-04-08 DIAGNOSIS — R339 Retention of urine, unspecified: Secondary | ICD-10-CM

## 2024-04-08 LAB — URINALYSIS, ROUTINE W REFLEX MICROSCOPIC
Bilirubin, UA: NEGATIVE
Glucose, UA: NEGATIVE
Ketones, UA: NEGATIVE
Leukocytes,UA: NEGATIVE
Nitrite, UA: NEGATIVE
Protein,UA: NEGATIVE
RBC, UA: NEGATIVE
Specific Gravity, UA: 1.01 (ref 1.005–1.030)
Urobilinogen, Ur: 0.2 mg/dL (ref 0.2–1.0)
pH, UA: 7 (ref 5.0–7.5)

## 2024-04-08 MED ORDER — ESTRADIOL 0.01 % VA CREA: TOPICAL_CREAM | VAGINAL | 12 refills | Status: AC

## 2024-04-08 NOTE — Progress Notes (Unsigned)
 "  04/08/2024 1:28 PM   Karen Vazquez 27-Aug-1955 [id number]  Referring provider: Mauro Elveria BROCKS, NP [address] [place],  [place] [address]  No chief complaint on file.   HPI:   1) frequency - also nocturia. Good stream. She has urgency. Occasional incontinence. She feels dry downt there. Wears depends but not wet. She has bowel urgency. She tried oxybutynin  5 and then 10. No change. She had bladder tack. Maybe sling. 15+ yrs. No NG risk. She had breast cancer in 2023 - lumpectomy, chemo and xrt. On anastrazole. She sees Dr. Gudena. Nov 2025 ua neg. Cx e coli.   190 2026: Today, patient seen for the above.    UA negative.  She worked for [employer] for yrs. 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    ^^^^^^^^^^^^^^^^^^^^^ A/P:  Frequency, urgency - Discussed GSM and topical estradiol . She said Dr. Gudena will allow. Also discussed beta3, PT, botox and NM. She will start estradiol  topical. Discussed CV and cancer concerns among others.   PMH: Past Medical History:  Diagnosis Date   Allergy 15 years ago   Dry cough   Anemia    Anxiety    Arthritis    Back pain    Blood transfusion 2011   at [facility]   Blood transfusion without reported diagnosis 2011   After knee replacement   Breast cancer (HCC) 09/08/21   Bulging lumbar disc    Cancer (HCC) August 28, 2021   Breast cancer   Cataract 2022   Both taken care of in 2022   Chest pain    Dry mouth    Easy bruising    Excessive thirst    Fatigue    Frequent urination    GERD (gastroesophageal reflux disease)    Headache    Heart murmur 20 years ago   Doctor thinking since birth   Heat intolerance    Hypertension    Hypothyroidism    Joint pain    Leg pain    Muscle stiffness    Nervousness    Palpitations    Personal history of chemotherapy    Personal history of radiation therapy    Pre-diabetes    Restless leg syndrome    Shortness of breath on exertion     Sleep apnea    does not use c-pap machine   Stomach ulcer    Stress    Swelling of both lower extremities    Trouble in sleeping    Vitamin D  deficiency    Weakness     Surgical History: Past Surgical History:  Procedure Laterality Date   ABDOMINAL HYSTERECTOMY  25 years ago   Partial hysterectomy. Still have ovaries   APPENDECTOMY     BIOPSY  05/16/2019   Procedure: BIOPSY;  Surgeon: Golda Claudis PENNER, MD;  [facility];  Service: Endoscopy;;  duodenum antral   BLADDER SUSPENSION  12/29/2010   Procedure: TRANSVAGINAL TAPE (TVT) PROCEDURE;  Surgeon: Rosaline DELENA Luna;  Location: [facility];  Service: Gynecology;  Laterality: N/A;   BREAST LUMPECTOMY Left 10/26/2021   BREAST LUMPECTOMY WITH RADIOACTIVE SEED AND SENTINEL LYMPH NODE BIOPSY Left 10/26/2021   Procedure: LEFT BREAST SEED LUMPECTOMY WITH LEFT SENTINEL LYMPH NODE MAPPING;  Surgeon: Vanderbilt Ned, MD;  Location: [facility];  Service: General;  Laterality: Left;   BREAST SURGERY     left breast lumpectomy 1977   CATARACT EXTRACTION  COLONOSCOPY N/A 03/03/2016   Procedure: COLONOSCOPY;  Surgeon: Claudis RAYMOND Rivet, MD;  [facility]: [facility];  Service: Endoscopy;  Laterality: N/A;  2:00 - moved to 12/14 @ 12:55 - Ann notified pt   COLONOSCOPY N/A 07/05/2023   Procedure: COLONOSCOPY;  Surgeon: Eartha Angelia Sieving, MD;  [facility]: [facility];  Service: Gastroenterology;  Laterality: N/A;  7:30AM;ASA 1   CYSTOCELE REPAIR  12/29/2010   Procedure: ANTERIOR REPAIR (CYSTOCELE);  Surgeon: Rosaline DELENA Luna;  Location: [facility];  Service: Gynecology;  Laterality: N/A;   CYSTOSCOPY  12/29/2010   Procedure: CYSTOSCOPY;  Surgeon: Rosaline DELENA Luna;  Location: [facility];  Service: Gynecology;  Laterality: N/A;   ESOPHAGOGASTRODUODENOSCOPY N/A 05/16/2019   Procedure: ESOPHAGOGASTRODUODENOSCOPY (EGD);  Surgeon: Rivet Claudis RAYMOND, MD;  Location: [facility];  Service: Endoscopy;  Laterality: N/A;   EYE  SURGERY  2023   Cataracts   FRACTURE SURGERY  30 years ago   Broken ankle abs then broken left wrist in 2021   JOINT REPLACEMENT  2011   rt knee   PORTACATH PLACEMENT N/A 10/26/2021   Procedure: PORT PLACEMENT WITH ULTRASOUND GUIDANCE;  Surgeon: Vanderbilt Ned, MD;  Location: [facility];  Service: General;  Laterality: N/A;   REPLACEMENT TOTAL KNEE Right 2011   TONSILLECTOMY     TOTAL KNEE ARTHROPLASTY Left 11/17/2014   Procedure: LEFT TOTAL KNEE ARTHROPLASTY;  Surgeon: Dempsey Moan, MD;  Location: [facility];  Service: Orthopedics;  Laterality: Left;   TUBAL LIGATION     VAGINAL HYSTERECTOMY  12/29/2010   Procedure: HYSTERECTOMY VAGINAL;  Surgeon: Rosaline DELENA Luna;  Location: [facility];  Service: Gynecology;  Laterality: N/A;    Home Medications:  Allergies as of 04/08/2024       Reactions   Lisinopril  Cough        Medication List        Accurate as of April 08, 2024  1:28 PM. If you have any questions, ask your nurse or doctor.          alendronate  70 MG tablet Commonly known as: FOSAMAX  Take 1 tablet (70 mg total) by mouth every 7 (seven) days. Take with a full glass of water  on an empty stomach.   ALPRAZolam  1 MG tablet Commonly known as: XANAX  Take 1 tablet (1 mg total) by mouth at bedtime. As needed for sleep   amLODipine  2.5 MG tablet Commonly known as: NORVASC  TAKE ONE TABLET (2.5MG  TOTAL) BY MOUTH DAILY   anastrozole  1 MG tablet Commonly known as: ARIMIDEX  TAKE ONE TABLET (1MG  TOTAL) BY MOUTH DAILY   diphenoxylate -atropine  2.5-0.025 MG tablet Commonly known as: LOMOTIL  Take 1 tablet by mouth 4 (four) times daily as needed for diarrhea or loose stools.   fluticasone  50 MCG/ACT nasal spray Commonly known as: FLONASE  USE 2 SPRAYS IN EACH NOSTRIL DAILY What changed:  when to take this reasons to take this   Multivitamin Gummies Childrens Chew FLINSTONES W/ IRON    omeprazole  40 MG capsule Commonly known as: PRILOSEC Take 1 capsule  (40 mg total) by mouth daily.   oxybutynin  10 MG 24 hr tablet Commonly known as: DITROPAN -XL Take 10 mg by mouth daily at 12 noon.   pregabalin  50 MG capsule Commonly known as: LYRICA  Take 1 capsule (50 mg total) by mouth every evening.   thyroid  30 MG tablet Commonly known as: ARMOUR Take 30 mg by mouth daily before breakfast.   Armour Thyroid  15 MG tablet Generic drug: thyroid  Take 15 mg by mouth  daily.        Allergies: Allergies[1]  Family History: Family History  Problem Relation Age of Onset   Stroke Mother    Hypertension Mother    Hyperlipidemia Mother    Thyroid  disease Mother    Arthritis Mother    Heart disease Father    COPD Father    Depression Father    Prostate cancer Father 12 - 64   Cancer Father    Breast cancer Paternal Grandmother 62 - 38   Cancer Paternal Grandmother    Diabetes Paternal Grandfather    Colon cancer Cousin    Depression Brother    Depression Brother     Social History:  reports that she has never smoked. She has never used smokeless tobacco. She reports that she does not drink alcohol and does not use drugs.   Physical Exam: BP 134/77   Pulse 77   LMP 05/24/2010   Constitutional:  Alert and oriented, No acute distress. HEENT: [place] AT, moist mucus membranes.  Trachea midline, no masses. Cardiovascular: No clubbing, cyanosis, or edema. Respiratory: Normal respiratory effort, no increased work of breathing. GI: Abdomen is soft, nontender, nondistended, no abdominal masses GU: No CVA tenderness Skin: No rashes, bruises or suspicious lesions. Neurologic: Grossly intact, no focal deficits, moving all 4 extremities. Psychiatric: Normal mood and affect.  Laboratory Data: Lab Results  Component Value Date   WBC 5.7 11/18/2022   HGB 12.9 11/18/2022   HCT 38.5 11/18/2022   MCV 85.2 11/18/2022   PLT 154 11/18/2022    Lab Results  Component Value Date   CREATININE 0.97 11/18/2022    No results found for: PSA  No  results found for: TESTOSTERONE  Lab Results  Component Value Date   HGBA1C 5.3 02/24/2021    Urinalysis    Component Value Date/Time   COLORURINE YELLOW 11/10/2014 0850   APPEARANCEUR Cloudy (A) 01/29/2024 1034   LABSPEC 1.012 11/10/2014 0850   PHURINE 5.5 11/10/2014 0850   GLUCOSEU Negative 01/29/2024 1034   HGBUR NEGATIVE 11/10/2014 0850   BILIRUBINUR Negative 01/29/2024 1034   KETONESUR NEGATIVE 11/10/2014 0850   PROTEINUR Negative 01/29/2024 1034   PROTEINUR NEGATIVE 11/10/2014 0850   UROBILINOGEN 0.2 08/10/2021 1022   UROBILINOGEN 0.2 11/10/2014 0850   NITRITE Negative 01/29/2024 1034   NITRITE NEGATIVE 11/10/2014 0850   LEUKOCYTESUR 1+ (A) 01/29/2024 1034    Lab Results  Component Value Date   BACTERIA RARE 08/06/2014    Pertinent Imaging: N/a     - Urinalysis, Routine w reflex microscopic; Future - BLADDER SCAN AMB NON-IMAGING - Urinalysis, Routine w reflex microscopic   No follow-ups on file.  Donnice Brooks, MD  [facility]  [address] [place], [place] [address] [phone]      [1]  Allergies Allergen Reactions   Lisinopril  Cough   "

## 2024-04-12 NOTE — Addendum Note (Signed)
 Addended by: SAMMIE EXIE HERO on: 04/12/2024 10:31 AM   Modules accepted: Orders

## 2024-04-17 ENCOUNTER — Other Ambulatory Visit: Payer: Self-pay | Admitting: Family Medicine

## 2024-05-27 ENCOUNTER — Ambulatory Visit: Attending: Surgery

## 2024-07-08 ENCOUNTER — Ambulatory Visit: Admitting: Urology

## 2024-07-15 ENCOUNTER — Ambulatory Visit: Admitting: Hematology and Oncology

## 2024-07-30 ENCOUNTER — Ambulatory Visit: Admitting: Nurse Practitioner

## 2024-09-13 ENCOUNTER — Ambulatory Visit

## 2025-03-13 ENCOUNTER — Inpatient Hospital Stay: Admitting: Hematology and Oncology
